# Patient Record
Sex: Male | Born: 1960 | Race: Black or African American | Hispanic: No | Marital: Married | Smoking: Former smoker
Health system: Southern US, Community
[De-identification: ages and names within clinical notes are randomized; demographics above are authoritative.]

## PROBLEM LIST (undated history)

## (undated) DIAGNOSIS — H269 Unspecified cataract: Secondary | ICD-10-CM

## (undated) DIAGNOSIS — M1990 Unspecified osteoarthritis, unspecified site: Secondary | ICD-10-CM

## (undated) DIAGNOSIS — C61 Malignant neoplasm of prostate: Secondary | ICD-10-CM

## (undated) DIAGNOSIS — F419 Anxiety disorder, unspecified: Secondary | ICD-10-CM

## (undated) DIAGNOSIS — F32A Depression, unspecified: Secondary | ICD-10-CM

## (undated) DIAGNOSIS — T7840XA Allergy, unspecified, initial encounter: Secondary | ICD-10-CM

## (undated) DIAGNOSIS — Z5189 Encounter for other specified aftercare: Secondary | ICD-10-CM

## (undated) DIAGNOSIS — C801 Malignant (primary) neoplasm, unspecified: Secondary | ICD-10-CM

## (undated) HISTORY — DX: Unspecified osteoarthritis, unspecified site: M19.90

## (undated) HISTORY — DX: Allergy, unspecified, initial encounter: T78.40XA

## (undated) HISTORY — DX: Unspecified cataract: H26.9

## (undated) HISTORY — PX: KNEE CARTILAGE SURGERY: SHX688

## (undated) HISTORY — PX: PROSTATE SURGERY: SHX751

## (undated) HISTORY — DX: Encounter for other specified aftercare: Z51.89

## (undated) HISTORY — PX: ROTATOR CUFF REPAIR: SHX139

---

## 2016-04-27 ENCOUNTER — Ambulatory Visit (INDEPENDENT_AMBULATORY_CARE_PROVIDER_SITE_OTHER): Payer: Self-pay | Admitting: Nurse Practitioner

## 2016-04-27 ENCOUNTER — Encounter: Payer: Self-pay | Admitting: *Deleted

## 2016-04-27 VITALS — BP 183/90 | HR 70 | Temp 98.3°F | Wt 133.8 lb

## 2016-04-27 DIAGNOSIS — S93602A Unspecified sprain of left foot, initial encounter: Secondary | ICD-10-CM

## 2016-04-27 NOTE — Progress Notes (Signed)
   Subjective:    Patient ID: Jeff Romero, male    DOB: Jun 24, 1961, 55 y.o.   MRN: [id number]  HPI The patient is a 55 y.o. AA male that presents with complaints of left foot pain x 5 days.  The patient states he was mowing the yard and stepped into a ditch twisting his foot.  The patient c/o swelling of pain to left foot and difficulty bearing weight.  The patient has used Ibuprofen and ice without relief.   Review of Systems  Musculoskeletal: Positive for gait problem and joint swelling.       Negative for deformity or crepitus, warm joint.       Objective:   Physical Exam  Constitutional: He is oriented to person, place, and time. He appears well-developed and well-nourished. No distress.  HENT:  Head: Normocephalic.  Neck: Normal range of motion.  Cardiovascular: Normal rate and normal heart sounds.   Pulmonary/Chest: Effort normal and breath sounds normal.  Musculoskeletal: He exhibits no deformity.  He exhibits tenderness and swelling to the phalanges of the great toe and the metatarsal bone.  Limited ROM, strong, equal, +2 dorsalis pedis and posterior tibial pulses. Toe warm with FROM, brisk capillary refill.  Neurological: He is alert and oriented to person, place, and time.  Skin: Skin is warm and dry.          Assessment & Plan:  The patient has been diagnosed with Left Foot Sprain.  An ace wrap was placed on the involved extremity.  RICE (rest, ice, compression and elevation) therapy advised.  The patient will also continue Ibuprofen 800mg  BID for continued pain and inflammation. The patient was given patient education on foot sprain and exercises.  Patient instructed to go to ER or return to clinic if no improvement in 7-10 days.  Patient verbalized understanding.

## 2016-05-06 ENCOUNTER — Ambulatory Visit (INDEPENDENT_AMBULATORY_CARE_PROVIDER_SITE_OTHER): Payer: Self-pay

## 2016-05-06 ENCOUNTER — Encounter (HOSPITAL_COMMUNITY): Payer: Self-pay | Admitting: Emergency Medicine

## 2016-05-06 ENCOUNTER — Ambulatory Visit (HOSPITAL_COMMUNITY)
Admission: EM | Admit: 2016-05-06 | Discharge: 2016-05-06 | Disposition: A | Discharge status: Home / Self Care | Payer: Self-pay | Attending: Family Medicine | Admitting: Family Medicine

## 2016-05-06 DIAGNOSIS — M79672 Pain in left foot: Secondary | ICD-10-CM

## 2016-05-06 NOTE — ED Provider Notes (Signed)
CSN: [id number]     Arrival date & time 05/06/16  1005 History   First MD Initiated Contact with Patient 05/06/16 1101     Chief Complaint  Patient presents with  . Foot Pain   (Consider location/radiation/quality/duration/timing/severity/associated sxs/prior Treatment) HPI 55 year old man with injury to his left foot August 19 after stepping in a hole twisting foot states he was seen at another urgent care that did not have an x-ray machine they advised him to wait a week and then have it x-rayed if he is still having pain patient states that he continues to have pain at this time with radiation up into the knee. Treatment has homeless included rest and elevation and Tylenol for pain. He is here with his wife requesting an x-ray this morning. History reviewed. No pertinent past medical history. Past Surgical History:  Procedure Laterality Date  . ROTATOR CUFF REPAIR Right    No family history on file. Social History  Substance Use Topics  . Smoking status: Former Smoker    Quit date: 05/09/2014  . Smokeless tobacco: Never Used  . Alcohol use Yes     Comment: social    Review of Systems  Denies: HEADACHE, NAUSEA, ABDOMINAL PAIN, CHEST PAIN, CONGESTION, DYSURIA, SHORTNESS OF BREATH  Allergies  Review of patient's allergies indicates no known allergies.  Home Medications   Prior to Admission medications   Not on File   Meds Ordered and Administered this Visit  Medications - No data to display  BP 160/100 (BP Location: Left Arm) Comment: Reported elevated BP to [facility]MA [employer]  Pulse 101   Temp 98.1 F (36.7 C) (Oral)   Resp 16   SpO2 100%  No data found.   Physical Exam NURSES NOTES AND VITAL SIGNS REVIEWED. CONSTITUTIONAL: Well developed, well nourished, no acute distress HEENT: normocephalic, atraumatic EYES: Conjunctiva normal NECK:normal ROM, supple, no adenopathy PULMONARY:No respiratory distress, normal effort ABDOMINAL: Soft, ND, NT BS+, No  CVAT MUSCULOSKELETAL: Normal ROM of all extremities, Left foot minimal tenderness noted without visible or   palpable deformity. Sensorimotor function are intact. SKIN: warm and dry without rash PSYCHIATRIC: Mood and affect, behavior are normal  Urgent Care Course   Clinical Course    Procedures (including critical care time)  Labs Review Labs Reviewed - No data to display  Imaging Review Dg Foot Complete Left  Result Date: 05/06/2016 CLINICAL DATA:  Foot pain after stepping in a hole. This occurred last week. EXAM: LEFT FOOT - COMPLETE 3+ VIEW COMPARISON:  None. FINDINGS: There is no evidence of fracture or dislocation. There is no evidence of arthropathy or other focal bone abnormality. Soft tissues are unremarkable. IMPRESSION: Negative. Electronically Signed   By: Staci Righter M.D.   On: 05/06/2016 11:16     Visual Acuity Review  Right Eye Distance:   Left Eye Distance:   Bilateral Distance:    Right Eye Near:   Left Eye Near:    Bilateral Near:        CAM WALKER AND REFER TO PODIATRY MDM   1. Foot pain, left     Patient is reassured that there are no issues that require transfer to higher level of care at this time or additional tests. Patient is advised to continue home symptomatic treatment. Patient is advised that if there are new or worsening symptoms to attend the emergency department, contact primary care provider, or return to UC. Instructions of care provided discharged home in stable condition.    THIS NOTE  WAS GENERATED USING A VOICE RECOGNITION SOFTWARE PROGRAM. ALL REASONABLE EFFORTS  WERE MADE TO PROOFREAD THIS DOCUMENT FOR ACCURACY.  I have verbally reviewed the discharge instructions with the patient. A printed AVS was given to the patient.  All questions were answered prior to discharge.      Konrad Felix, [place] 05/06/16 2038

## 2016-05-06 NOTE — ED Triage Notes (Signed)
PT rolled his ankle inward at work a week ago. PT reports he went to an urgent care and was told he may have a hairline fracture and to RICE for 1 week. PT reports pain is worse now. PT was never xrayed

## 2016-07-12 ENCOUNTER — Inpatient Hospital Stay (HOSPITAL_COMMUNITY)
Admission: EM | Admit: 2016-07-12 | Discharge: 2016-07-15 | DRG: 897 | Disposition: A | Discharge status: Home / Self Care | Payer: Self-pay | Attending: Family Medicine | Admitting: Family Medicine

## 2016-07-12 ENCOUNTER — Encounter (HOSPITAL_COMMUNITY): Payer: Self-pay

## 2016-07-12 ENCOUNTER — Ambulatory Visit (HOSPITAL_COMMUNITY)
Admission: EM | Admit: 2016-07-12 | Discharge: 2016-07-12 | Disposition: A | Discharge status: Nursing Home (Includes ICF) | Payer: Self-pay | Attending: Internal Medicine | Admitting: Internal Medicine

## 2016-07-12 DIAGNOSIS — F10188 Alcohol abuse with other alcohol-induced disorder: Principal | ICD-10-CM | POA: Diagnosis present

## 2016-07-12 DIAGNOSIS — E876 Hypokalemia: Secondary | ICD-10-CM | POA: Diagnosis present

## 2016-07-12 DIAGNOSIS — R1013 Epigastric pain: Secondary | ICD-10-CM

## 2016-07-12 DIAGNOSIS — R748 Abnormal levels of other serum enzymes: Secondary | ICD-10-CM | POA: Diagnosis present

## 2016-07-12 DIAGNOSIS — Z79899 Other long term (current) drug therapy: Secondary | ICD-10-CM

## 2016-07-12 DIAGNOSIS — Z9889 Other specified postprocedural states: Secondary | ICD-10-CM

## 2016-07-12 DIAGNOSIS — R112 Nausea with vomiting, unspecified: Secondary | ICD-10-CM | POA: Diagnosis present

## 2016-07-12 DIAGNOSIS — Z681 Body mass index (BMI) 19 or less, adult: Secondary | ICD-10-CM

## 2016-07-12 DIAGNOSIS — K759 Inflammatory liver disease, unspecified: Secondary | ICD-10-CM

## 2016-07-12 DIAGNOSIS — Z8249 Family history of ischemic heart disease and other diseases of the circulatory system: Secondary | ICD-10-CM

## 2016-07-12 DIAGNOSIS — D6959 Other secondary thrombocytopenia: Secondary | ICD-10-CM | POA: Diagnosis present

## 2016-07-12 DIAGNOSIS — R740 Nonspecific elevation of levels of transaminase and lactic acid dehydrogenase [LDH]: Secondary | ICD-10-CM | POA: Diagnosis present

## 2016-07-12 DIAGNOSIS — R109 Unspecified abdominal pain: Secondary | ICD-10-CM | POA: Diagnosis present

## 2016-07-12 DIAGNOSIS — E440 Moderate protein-calorie malnutrition: Secondary | ICD-10-CM | POA: Insufficient documentation

## 2016-07-12 DIAGNOSIS — F1010 Alcohol abuse, uncomplicated: Secondary | ICD-10-CM | POA: Diagnosis present

## 2016-07-12 DIAGNOSIS — Z87891 Personal history of nicotine dependence: Secondary | ICD-10-CM

## 2016-07-12 DIAGNOSIS — R7401 Elevation of levels of liver transaminase levels: Secondary | ICD-10-CM | POA: Diagnosis present

## 2016-07-12 DIAGNOSIS — E860 Dehydration: Secondary | ICD-10-CM | POA: Diagnosis present

## 2016-07-12 DIAGNOSIS — R7989 Other specified abnormal findings of blood chemistry: Secondary | ICD-10-CM | POA: Diagnosis present

## 2016-07-12 LAB — COMPREHENSIVE METABOLIC PANEL
ALBUMIN: 2.6 g/dL — AB (ref 3.5–5.0)
ALK PHOS: 270 U/L — AB (ref 38–126)
ALT: 151 U/L — ABNORMAL HIGH (ref 17–63)
ANION GAP: 11 (ref 5–15)
AST: 409 U/L — AB (ref 15–41)
BILIRUBIN TOTAL: 6.6 mg/dL — AB (ref 0.3–1.2)
CALCIUM: 8.7 mg/dL — AB (ref 8.9–10.3)
CO2: 28 mmol/L (ref 22–32)
Chloride: 93 mmol/L — ABNORMAL LOW (ref 101–111)
Creatinine, Ser: 1.07 mg/dL (ref 0.61–1.24)
GFR calc Af Amer: >60 mL/min (ref >60)
GLUCOSE: 135 mg/dL — AB (ref 65–99)
Potassium: 3 mmol/L — ABNORMAL LOW (ref 3.5–5.1)
Sodium: 132 mmol/L — ABNORMAL LOW (ref 135–145)
TOTAL PROTEIN: 5.8 g/dL — AB (ref 6.5–8.1)

## 2016-07-12 LAB — URINALYSIS, ROUTINE W REFLEX MICROSCOPIC
GLUCOSE, UA: 100 mg/dL — AB
KETONES UR: 15 mg/dL — AB
Nitrite: POSITIVE — AB
PROTEIN: 100 mg/dL — AB
Specific Gravity, Urine: 1.01 (ref 1.005–1.030)
pH: >9 — ABNORMAL HIGH (ref 5.0–8.0)

## 2016-07-12 LAB — CBC
HCT: 38.6 % — ABNORMAL LOW (ref 39.0–52.0)
Hemoglobin: 13.2 g/dL (ref 13.0–17.0)
MCH: 31.4 pg (ref 26.0–34.0)
MCHC: 34.2 g/dL (ref 30.0–36.0)
MCV: 91.7 fL (ref 78.0–100.0)
PLATELETS: 134 10*3/uL — AB (ref 150–400)
RBC: 4.21 MIL/uL — AB (ref 4.22–5.81)
RDW: 16.1 % — AB (ref 11.5–15.5)
WBC: 10.8 10*3/uL — AB (ref 4.0–10.5)

## 2016-07-12 LAB — URINE MICROSCOPIC-ADD ON

## 2016-07-12 LAB — PROTIME-INR
INR: 0.96
Prothrombin Time: 12.8 seconds (ref 11.4–15.2)

## 2016-07-12 LAB — I-STAT TROPONIN, ED: TROPONIN I, POC: 0 ng/mL (ref 0.00–0.08)

## 2016-07-12 LAB — ACETAMINOPHEN LEVEL

## 2016-07-12 LAB — LIPASE, BLOOD: Lipase: 88 U/L — ABNORMAL HIGH (ref 11–51)

## 2016-07-12 LAB — I-STAT CG4 LACTIC ACID, ED: LACTIC ACID, VENOUS: 1.35 mmol/L (ref 0.5–1.9)

## 2016-07-12 MED ORDER — ONDANSETRON 4 MG PO TBDP
4.0000 mg | ORAL_TABLET | Freq: Once | ORAL | Status: AC
  Administered 2016-07-12: 4 mg via ORAL

## 2016-07-12 MED ORDER — MORPHINE SULFATE (PF) 4 MG/ML IV SOLN
4.0000 mg | Freq: Once | INTRAVENOUS | Status: AC
  Administered 2016-07-12: 4 mg via INTRAVENOUS
  Filled 2016-07-12: qty 1

## 2016-07-12 MED ORDER — ONDANSETRON 4 MG PO TBDP
ORAL_TABLET | ORAL | Status: AC
  Filled 2016-07-12: qty 1

## 2016-07-12 MED ORDER — SODIUM CHLORIDE 0.9 % IV BOLUS (SEPSIS)
1000.0000 mL | Freq: Once | INTRAVENOUS | Status: AC
  Administered 2016-07-12: 1000 mL via INTRAVENOUS

## 2016-07-12 MED ORDER — ONDANSETRON HCL 4 MG/2ML IJ SOLN
4.0000 mg | Freq: Once | INTRAMUSCULAR | Status: AC
  Administered 2016-07-12: 4 mg via INTRAVENOUS
  Filled 2016-07-12: qty 2

## 2016-07-12 NOTE — ED Provider Notes (Signed)
[facility]    CSN: [id number] Arrival date & time: 07/12/16  1704     History   Chief Complaint Chief Complaint  Patient presents with  . Abdominal Pain    HPI Jeff Romero is a 55 y.o. male.   HPI Patient presents with severe epigastric pain and nausea x 3 days.  No vomiting but has dry heaves.  Pain getting worse. Admits hx of etoh abuse and gastric ulcer.  Denies fever, chills, constipation, diarrhea, melena, hematochezia.  Normal bowel movement today.  Has never had pain of this nature in the past.   No past medical history on file.  There are no active problems to display for this patient.   Past Surgical History:  Procedure Laterality Date  . ROTATOR CUFF REPAIR Right        Home Medications    Prior to Admission medications   Not on File    Family History No family history on file.  Social History Social History  Substance Use Topics  . Smoking status: Former Smoker    Quit date: 05/09/2014  . Smokeless tobacco: Never Used  . Alcohol use Yes     Comment: social     Allergies   Review of patient's allergies indicates no known allergies.   Review of Systems Review of Systems  Constitutional: Positive for activity change, appetite change and fatigue.  HENT: Negative.   Eyes: Negative.   Respiratory: Negative.   Cardiovascular: Negative.   Gastrointestinal: Positive for abdominal pain and nausea. Negative for anal bleeding, blood in stool, constipation, diarrhea and vomiting.  Genitourinary: Negative.   Musculoskeletal: Negative.   Skin: Negative.   Psychiatric/Behavioral: Negative.      Physical Exam Triage Vital Signs ED Triage Vitals  Enc Vitals Group     BP 07/12/16 1736 149/99     Pulse Rate 07/12/16 1736 112     Resp 07/12/16 1736 18     Temp 07/12/16 1736 97.7 F (36.5 C)     Temp Source 07/12/16 1736 Oral     SpO2 07/12/16 1736 100 %     Weight --      Height --      Head Circumference --      Peak Flow --     Pain Score 07/12/16 1757 9     Pain Loc --      Pain Edu? --      Excl. in [place]? --    No data found.   Updated Vital Signs BP 149/99 (BP Location: Left Arm)   Pulse 112   Temp 97.7 F (36.5 C) (Oral)   Resp 18   SpO2 100%   Visual Acuity Right Eye Distance:   Left Eye Distance:   Bilateral Distance:    Right Eye Near:   Left Eye Near:    Bilateral Near:     Physical Exam  Constitutional: He is oriented to person, place, and time. No distress.  HENT:  Head: Normocephalic and atraumatic.  Eyes: EOM are normal. Pupils are equal, round, and reactive to light.  Neck: Normal range of motion.  Cardiovascular: Regular rhythm.   tachycardic  Pulmonary/Chest: Effort normal.  Abdominal: He exhibits no mass. There is tenderness. There is guarding.  Musculoskeletal: Normal range of motion.  Neurological: He is alert and oriented to person, place, and time.  Skin: Skin is warm and dry.     UC Treatments / Results  Labs (all labs ordered are listed, but only abnormal  results are displayed) Labs Reviewed - No data to display  EKG  EKG Interpretation None       Radiology No results found.  Procedures Procedures (including critical care time)  Medications Ordered in UC Medications - No data to display   Initial Impression / Assessment and Plan / UC Course  I have reviewed the triage vital signs and the nursing notes.  Pertinent labs & imaging results that were available during my care of the patient were reviewed by me and considered in my medical decision making (see chart for details).  Clinical Course      Final Clinical Impressions(s) / UC Diagnoses   Final diagnoses:  Epigastric pain    New Prescriptions New Prescriptions   No medications on file  due to the severity of patient's pain and duration of symptoms we recommend that he go immediately to the ED for evaluation and treatment.  Patient and family member both voice understanding.     Lanae Crumbly, PA-C 07/12/16 1815

## 2016-07-12 NOTE — ED Triage Notes (Addendum)
Patient complains of epigastric pain and cramping with vomiting since Friday, denies diarrhea. States that he gets mild relief with anti-acids but the discomfort quickly returns. Offered zofran at triage, patient denied. Patient diaphoretic and restless on arrival. Patient states that he drinks 6 beers per day

## 2016-07-12 NOTE — ED Triage Notes (Signed)
The patient presented to the [facility] with a complaint of medial abdominal pain with N/V that started 2 days ago. The patient described the pain as sharp and somewhat epigastric in location. The patient also stated he has been able to keep down fluids and some food but after eating ice cream today the pain became more intense.

## 2016-07-12 NOTE — ED Notes (Signed)
Nurse drawing labs. 

## 2016-07-13 ENCOUNTER — Inpatient Hospital Stay (HOSPITAL_COMMUNITY): Payer: Self-pay

## 2016-07-13 ENCOUNTER — Encounter (HOSPITAL_COMMUNITY): Payer: Self-pay | Admitting: Radiology

## 2016-07-13 ENCOUNTER — Emergency Department (HOSPITAL_COMMUNITY): Payer: Self-pay

## 2016-07-13 ENCOUNTER — Other Ambulatory Visit (HOSPITAL_COMMUNITY): Payer: Self-pay

## 2016-07-13 DIAGNOSIS — R740 Nonspecific elevation of levels of transaminase and lactic acid dehydrogenase [LDH]: Secondary | ICD-10-CM

## 2016-07-13 DIAGNOSIS — R7401 Elevation of levels of liver transaminase levels: Secondary | ICD-10-CM | POA: Diagnosis present

## 2016-07-13 DIAGNOSIS — R109 Unspecified abdominal pain: Secondary | ICD-10-CM | POA: Diagnosis present

## 2016-07-13 DIAGNOSIS — F1010 Alcohol abuse, uncomplicated: Secondary | ICD-10-CM | POA: Diagnosis present

## 2016-07-13 DIAGNOSIS — R1013 Epigastric pain: Secondary | ICD-10-CM

## 2016-07-13 DIAGNOSIS — R112 Nausea with vomiting, unspecified: Secondary | ICD-10-CM | POA: Diagnosis present

## 2016-07-13 LAB — CBC WITH DIFFERENTIAL/PLATELET
Basophils Absolute: 0 10*3/uL (ref 0.0–0.1)
Basophils Relative: 1 %
EOS ABS: 0 10*3/uL (ref 0.0–0.7)
EOS PCT: 0 %
HCT: 30.8 % — ABNORMAL LOW (ref 39.0–52.0)
HEMOGLOBIN: 10.6 g/dL — AB (ref 13.0–17.0)
LYMPHS ABS: 1.3 10*3/uL (ref 0.7–4.0)
Lymphocytes Relative: 22 %
MCH: 31.8 pg (ref 26.0–34.0)
MCHC: 34.4 g/dL (ref 30.0–36.0)
MCV: 92.5 fL (ref 78.0–100.0)
MONO ABS: 0.4 10*3/uL (ref 0.1–1.0)
MONOS PCT: 6 %
Neutro Abs: 4.2 10*3/uL (ref 1.7–7.7)
Neutrophils Relative %: 71 %
PLATELETS: 111 10*3/uL — AB (ref 150–400)
RBC: 3.33 MIL/uL — ABNORMAL LOW (ref 4.22–5.81)
RDW: 16.8 % — AB (ref 11.5–15.5)
WBC: 5.8 10*3/uL (ref 4.0–10.5)

## 2016-07-13 LAB — GLUCOSE, CAPILLARY
GLUCOSE-CAPILLARY: 81 mg/dL (ref 65–99)
Glucose-Capillary: 109 mg/dL — ABNORMAL HIGH (ref 65–99)

## 2016-07-13 LAB — HEPATIC FUNCTION PANEL
ALBUMIN: 2.3 g/dL — AB (ref 3.5–5.0)
ALK PHOS: 245 U/L — AB (ref 38–126)
ALT: 132 U/L — AB (ref 17–63)
AST: 281 U/L — AB (ref 15–41)
BILIRUBIN DIRECT: 3 mg/dL — AB (ref 0.1–0.5)
BILIRUBIN TOTAL: 4.6 mg/dL — AB (ref 0.3–1.2)
Indirect Bilirubin: 1.6 mg/dL — ABNORMAL HIGH (ref 0.3–0.9)
Total Protein: 5.1 g/dL — ABNORMAL LOW (ref 6.5–8.1)

## 2016-07-13 LAB — BASIC METABOLIC PANEL
ANION GAP: 5 (ref 5–15)
BUN: <5 mg/dL — ABNORMAL LOW (ref 6–20)
CHLORIDE: 101 mmol/L (ref 101–111)
CO2: 27 mmol/L (ref 22–32)
Calcium: 8.1 mg/dL — ABNORMAL LOW (ref 8.9–10.3)
Creatinine, Ser: 1.02 mg/dL (ref 0.61–1.24)
GFR calc non Af Amer: >60 mL/min (ref >60)
GLUCOSE: 100 mg/dL — AB (ref 65–99)
Potassium: 4 mmol/L (ref 3.5–5.1)
Sodium: 133 mmol/L — ABNORMAL LOW (ref 135–145)

## 2016-07-13 LAB — PHOSPHORUS: Phosphorus: 3.5 mg/dL (ref 2.5–4.6)

## 2016-07-13 LAB — MAGNESIUM
MAGNESIUM: 1.8 mg/dL (ref 1.7–2.4)
MAGNESIUM: 1.6 mg/dL — AB (ref 1.7–2.4)

## 2016-07-13 MED ORDER — PIPERACILLIN-TAZOBACTAM 3.375 G IVPB
3.3750 g | Freq: Three times a day (TID) | INTRAVENOUS | Status: DC
  Administered 2016-07-13 – 2016-07-14 (×3): 3.375 g via INTRAVENOUS
  Filled 2016-07-13 (×5): qty 50

## 2016-07-13 MED ORDER — PIPERACILLIN-TAZOBACTAM 3.375 G IVPB 30 MIN
3.3750 g | Freq: Once | INTRAVENOUS | Status: DC
  Filled 2016-07-13: qty 50

## 2016-07-13 MED ORDER — VITAMIN B-1 100 MG PO TABS
100.0000 mg | ORAL_TABLET | Freq: Every day | ORAL | Status: DC
  Administered 2016-07-13 – 2016-07-15 (×3): 100 mg via ORAL
  Filled 2016-07-13 (×3): qty 1

## 2016-07-13 MED ORDER — THIAMINE HCL 100 MG/ML IJ SOLN
100.0000 mg | Freq: Every day | INTRAMUSCULAR | Status: DC
  Filled 2016-07-13: qty 2

## 2016-07-13 MED ORDER — SODIUM CHLORIDE 0.9 % IV BOLUS (SEPSIS)
1000.0000 mL | Freq: Once | INTRAVENOUS | Status: AC
Start: 2016-07-13 — End: 2016-07-13
  Administered 2016-07-13: 1000 mL via INTRAVENOUS

## 2016-07-13 MED ORDER — IOPAMIDOL (ISOVUE-300) INJECTION 61%
INTRAVENOUS | Status: AC
  Administered 2016-07-13: 100 mL
  Filled 2016-07-13: qty 100

## 2016-07-13 MED ORDER — LORAZEPAM 2 MG/ML IJ SOLN
0.0000 mg | Freq: Two times a day (BID) | INTRAMUSCULAR | Status: DC
Start: 2016-07-15 — End: 2016-07-15

## 2016-07-13 MED ORDER — HYDROMORPHONE HCL 1 MG/ML IJ SOLN
1.0000 mg | Freq: Once | INTRAMUSCULAR | Status: AC
  Administered 2016-07-13: 1 mg via INTRAVENOUS
  Filled 2016-07-13: qty 1

## 2016-07-13 MED ORDER — GADOBENATE DIMEGLUMINE 529 MG/ML IV SOLN
11.0000 mL | Freq: Once | INTRAVENOUS | Status: AC | PRN
  Administered 2016-07-13: 11 mL via INTRAVENOUS

## 2016-07-13 MED ORDER — ONDANSETRON HCL 4 MG/2ML IJ SOLN: 4.0000 mg | Freq: Four times a day (QID) | INTRAMUSCULAR | Status: DC | PRN

## 2016-07-13 MED ORDER — WHITE PETROLATUM GEL
Status: AC
  Administered 2016-07-13: 09:00:00
  Filled 2016-07-13: qty 1

## 2016-07-13 MED ORDER — POTASSIUM CHLORIDE 10 MEQ/100ML IV SOLN
10.0000 meq | INTRAVENOUS | Status: AC
  Administered 2016-07-13 (×2): 10 meq via INTRAVENOUS
  Filled 2016-07-13: qty 100

## 2016-07-13 MED ORDER — ADULT MULTIVITAMIN W/MINERALS CH
1.0000 | ORAL_TABLET | Freq: Every day | ORAL | Status: DC
  Administered 2016-07-14 – 2016-07-15 (×2): 1 via ORAL
  Filled 2016-07-13 (×3): qty 1

## 2016-07-13 MED ORDER — LORAZEPAM 2 MG/ML IJ SOLN: 0.5000 mg | Freq: Once | INTRAMUSCULAR | Status: DC

## 2016-07-13 MED ORDER — MORPHINE SULFATE (PF) 2 MG/ML IV SOLN: 2.0000 mg | INTRAVENOUS | Status: DC | PRN

## 2016-07-13 MED ORDER — POTASSIUM CHLORIDE 10 MEQ/100ML IV SOLN
10.0000 meq | INTRAVENOUS | Status: AC
  Administered 2016-07-13 (×2): 10 meq via INTRAVENOUS
  Filled 2016-07-13 (×3): qty 100

## 2016-07-13 MED ORDER — ONDANSETRON HCL 4 MG PO TABS: 4.0000 mg | ORAL_TABLET | Freq: Four times a day (QID) | ORAL | Status: DC | PRN

## 2016-07-13 MED ORDER — LORAZEPAM 1 MG PO TABS: 1.0000 mg | ORAL_TABLET | Freq: Four times a day (QID) | ORAL | Status: DC | PRN

## 2016-07-13 MED ORDER — SODIUM CHLORIDE 0.9 % IV SOLN
INTRAVENOUS | Status: AC
  Administered 2016-07-13 (×2): via INTRAVENOUS

## 2016-07-13 MED ORDER — LORAZEPAM 2 MG/ML IJ SOLN
0.0000 mg | Freq: Four times a day (QID) | INTRAMUSCULAR | Status: AC
Start: 2016-07-13 — End: 2016-07-15

## 2016-07-13 MED ORDER — MORPHINE SULFATE (PF) 2 MG/ML IV SOLN
2.0000 mg | INTRAVENOUS | Status: DC | PRN
  Administered 2016-07-13 (×3): 2 mg via INTRAVENOUS
  Filled 2016-07-13 (×4): qty 1

## 2016-07-13 MED ORDER — ORAL CARE MOUTH RINSE
15.0000 mL | Freq: Two times a day (BID) | OROMUCOSAL | Status: DC
  Administered 2016-07-13 – 2016-07-15 (×4): 15 mL via OROMUCOSAL

## 2016-07-13 MED ORDER — LORAZEPAM 2 MG/ML IJ SOLN: 1.0000 mg | Freq: Four times a day (QID) | INTRAMUSCULAR | Status: DC | PRN

## 2016-07-13 MED ORDER — FOLIC ACID 1 MG PO TABS
1.0000 mg | ORAL_TABLET | Freq: Every day | ORAL | Status: DC
  Administered 2016-07-14 – 2016-07-15 (×2): 1 mg via ORAL
  Filled 2016-07-13 (×3): qty 1

## 2016-07-13 MED ORDER — MAGNESIUM SULFATE 2 GM/50ML IV SOLN
2.0000 g | Freq: Once | INTRAVENOUS | Status: AC
Start: 2016-07-13 — End: 2016-07-13
  Administered 2016-07-13: 2 g via INTRAVENOUS
  Filled 2016-07-13: qty 50

## 2016-07-13 NOTE — Progress Notes (Signed)
PROGRESS NOTE    Jeff Romero  [id number] DOB: 1961/01/10 DOA: 07/12/2016 PCP: No PCP Per Patient   Brief Narrative: Jeff Romero is a 55 y.o. male with a history of alcohol abuse. He presented with nausea, vomiting and epigastric pain and was found to have possible cholecystitis. He had associated elevations in AST/ALT and alkaline phosphatase with elevated bilirubin (indirect and direct)   Assessment & Plan:   Principal Problem:   Abdominal pain Active Problems:   Transaminitis   Nausea & vomiting   Alcohol abuse   Abdominal pain with nausea and vomiting Possibly secondary to cholecystitis. Surgery suggesting ERCP. Per note, they have already consulted GI -follow-up GI recommendations -follow-up surgery recommendations -NPO -pain management  Transaminitis Elevated Alkaline phosphatase Hyperbilirubinemia Possibly secondary to alcoholic hepatitis vs sequelae from possible choledocholithiasis. -hepatitis panel pending -GI consulted for possible ERCP  Hypomagnesemia -replete  Alcohol abuse -continue CIWA -continue multivitamin, thiamine, folic acid  Hypokalemia -resolved    DVT prophylaxis: SCDs Code Status: Full code Family Communication: None at bedside Disposition Plan: Discharge home once improved   Consultants:   Surgery  GI per surgery  Procedures:   None  Antimicrobials:  Zosyn (10/9>>   Subjective: Patient reports no pain at the current time. No nausea or emesis. No chest pain.  Objective: Vitals:   07/13/16 0115 07/13/16 0200 07/13/16 0245 07/13/16 0337  BP:    108/67  Pulse: 93 86 91 78  Resp: 16 15 11 18   Temp:    99 F (37.2 C)  TempSrc:      SpO2: 98% 98% 98% 99%  Weight:    56.7 kg (124 lb 14.4 oz)  Height:    5\' 7"  (1.702 m)    Intake/Output Summary (Last 24 hours) at 07/13/16 1236 Last data filed at 07/13/16 [id number]  Gross per 24 hour  Intake          1316.25 ml  Output              200 ml  Net          1116.25 ml    Filed Weights   07/12/16 1824 07/13/16 0337  Weight: 61.2 kg (135 lb) 56.7 kg (124 lb 14.4 oz)    Examination:  General exam: Appears calm and comfortable. Thin appearing Respiratory system: Clear to auscultation. Respiratory effort normal. Cardiovascular system: S1 & S2 heard, RRR. No murmurs, rubs, gallops or clicks. Gastrointestinal system: Abdomen is nondistended, soft and nontender. No organomegaly or masses felt. Normal bowel sounds heard. Central nervous system: Alert and oriented. No focal neurological deficits. No asterixis. No tremor Extremities: No edema. No calf tenderness Skin: No cyanosis. No rashes Psychiatry: Judgement and insight appear normal. Mood & affect appropriate.     Data Reviewed: I have personally reviewed following labs and imaging studies  CBC:  Recent Labs Lab 07/12/16 1826 07/13/16 0617  WBC 10.8* 5.8  NEUTROABS  --  4.2  HGB 13.2 10.6*  HCT 38.6* 30.8*  MCV 91.7 92.5  PLT 134* [id number]*   Basic Metabolic Panel:  Recent Labs Lab 07/12/16 1826 07/13/16 0617  NA 132* 133*  K 3.0* 4.0  CL 93* 101  CO2 28 27  GLUCOSE 135* 100*  BUN <5* <5*  CREATININE 1.07 1.02  CALCIUM 8.7* 8.1*  MG 1.8 1.6*  PHOS 3.5  --    GFR: Estimated Creatinine Clearance: 65.6 mL/min (by C-G formula based on SCr of 1.02 mg/dL). Liver Function Tests:  Recent Labs Lab 07/12/16 1826  07/13/16 0617  AST 409* 281*  ALT 151* 132*  ALKPHOS 270* 245*  BILITOT 6.6* 4.6*  PROT 5.8* 5.1*  ALBUMIN 2.6* 2.3*    Recent Labs Lab 07/12/16 1826  LIPASE 88*   No results for input(s): AMMONIA in the last 168 hours. Coagulation Profile:  Recent Labs Lab 07/12/16 2115  INR 0.96   Cardiac Enzymes: No results for input(s): CKTOTAL, CKMB, CKMBINDEX, TROPONINI in the last 168 hours. BNP (last 3 results) No results for input(s): PROBNP in the last 8760 hours. HbA1C: No results for input(s): HGBA1C in the last 72 hours. CBG:  Recent Labs Lab 07/13/16 0631   GLUCAP 109*   Lipid Profile: No results for input(s): CHOL, HDL, LDLCALC, TRIG, CHOLHDL, LDLDIRECT in the last 72 hours. Thyroid Function Tests: No results for input(s): TSH, T4TOTAL, FREET4, T3FREE, THYROIDAB in the last 72 hours. Anemia Panel: No results for input(s): VITAMINB12, FOLATE, FERRITIN, TIBC, IRON, RETICCTPCT in the last 72 hours. Sepsis Labs:  Recent Labs Lab 07/12/16 2130  LATICACIDVEN 1.35    No results found for this or any previous visit (from the past 240 hour(s)).       Radiology Studies: US Abdomen Complete  Result Date: 07/13/2016 CLINICAL DATA:  Abdominal pain, epigastric pain EXAM: ABDOMEN ULTRASOUND COMPLETE COMPARISON:  None. FINDINGS: Gallbladder: Mildly abnormal gallbladder wall thickening at 4 mm. Sonographic Murphy's sign absent. Sludge is present in the gallbladder. Gallbladder slightly distended at 4 cm diameter. Common bile duct: Diameter: 6 mm Liver: Very minimally complex cystic lesion in the right hepatic lobe shown on image 39/8, subtle internal echoes, enhance through transmission. Coarse echogenic liver with poor sonic penetration compatible with diffuse hepatic steatosis. IVC: No abnormality visualized. Pancreas: Visualized portion unremarkable. Spleen: Not well visualized sonographically, but normal appearance on the CT scan from earlier today. Right Kidney: Length: 10.0 cm. Echogenicity within normal limits. No mass or hydronephrosis visualized. Left Kidney: Length: 10.2 cm. Echogenicity within normal limits. No mass or hydronephrosis visualized. Abdominal aorta: No aneurysm visualized. Other findings: None. IMPRESSION: 1. Thick-walled gallbladder, slightly distended, with internal sludge but no definite calculi. Sonographic Murphy's sign absent. Correlate clinically in assessing for cholecystitis. 2. Diffuse hepatic steatosis. 3. Small cyst in the liver, minimal complexity, likely benign. 4. Nonvisualization of the spleen due to difficulty  obtaining sonic window ; the spleen appeared normal on the earlier CT scan. Electronically Signed   By: Van Clines M.D.   On: 07/13/2016 10:55   Ct Abdomen Pelvis W Contrast  Result Date: 07/13/2016 CLINICAL DATA:  Acute onset of right upper quadrant abdominal pain, and bilateral lower quadrant abdominal pain. Nausea and vomiting. Initial encounter. EXAM: CT ABDOMEN AND PELVIS WITH CONTRAST TECHNIQUE: Multidetector CT imaging of the abdomen and pelvis was performed using the standard protocol following bolus administration of intravenous contrast. CONTRAST:  149mL ISOVUE-300 IOPAMIDOL (ISOVUE-300) INJECTION 61% COMPARISON:  None. FINDINGS: Lower chest: The visualized lung bases are grossly clear. The visualized portions of the mediastinum are unremarkable. Hepatobiliary: A 1.1 cm cyst is noted at the right hepatic lobe. The liver is otherwise unremarkable. Mild edema is noted at the gallbladder wall, with trace pericholecystic fluid, raising concern for mild acute cholecystitis. The gallbladder is mildly distended. The common bile duct remains normal in caliber. Pancreas: The pancreas is within normal limits. Spleen: The spleen is unremarkable in appearance. Adrenals/Urinary Tract: The adrenal glands are unremarkable in appearance. The kidneys are within normal limits. There is no evidence of hydronephrosis. No renal or ureteral stones are  identified. No perinephric stranding is seen. Stomach/Bowel: The stomach is unremarkable in appearance. The small bowel is within normal limits. The appendix is normal in caliber, without evidence of appendicitis. The colon is unremarkable in appearance. Trace fluid is seen tracking along the right paracolic gutter. Vascular/Lymphatic: Diffuse calcification is noted along the common iliac arteries bilaterally, with mild calcification noted along the abdominal aorta. No retroperitoneal or pelvic sidewall lymphadenopathy is seen. Reproductive: The prostate is borderline  prominent. The bladder is mildly distended and grossly unremarkable. Other: A small amount of free fluid is seen within the pelvis, of uncertain significance. Musculoskeletal: No acute osseous abnormalities are identified. The visualized musculature is unremarkable in appearance. IMPRESSION: 1. Mild edema at the gallbladder wall, with trace pericholecystic fluid, raising concern for mild acute cholecystitis. Gallbladder mildly distended in appearance. 2. Trace fluid tracking along the right paracolic gutter, and small amount of free fluid in the pelvis. This may reflect the gallbladder process. 3. 1.1 cm hepatic cyst noted. 4. Scattered aortic atherosclerosis, with diffuse calcification along the common iliac arteries bilaterally. Electronically Signed   By: Garald Balding M.D.   On: 07/13/2016 01:32        Scheduled Meds: . folic acid  1 mg Oral Daily  . LORazepam  0-4 mg Intravenous Q6H   Followed by  . [START ON 07/15/2016] LORazepam  0-4 mg Intravenous Q12H  . mouth rinse  15 mL Mouth Rinse BID  . multivitamin with minerals  1 tablet Oral Daily  . piperacillin-tazobactam  3.375 g Intravenous Once  . piperacillin-tazobactam (ZOSYN)  IV  3.375 g Intravenous Q8H  . thiamine  100 mg Oral Daily   Or  . thiamine  100 mg Intravenous Daily   Continuous Infusions: . sodium chloride 75 mL/hr at 07/13/16 0909     LOS: 0 days     Cordelia Poche [facility] 07/13/2016, 12:36 PM Pager: [phone]  If 7PM-7AM, please contact night-coverage [email or web address] Password [email or web address] 07/13/2016, 12:36 PM

## 2016-07-13 NOTE — Care Management Note (Signed)
Case Management Note  Patient Details  Name: Lendon Lantis MRN: [id number] Date of Birth: Dec 25, 1960  Subjective/Objective:                 Patient from home with spouse, CIWA, states 6-8 beers a day. R/o choley, is NPO.    Action/Plan:  May need assistance with PCP/ meds at DC.   Expected Discharge Date:                  Expected Discharge Plan:  Home/Self Care  In-House Referral:  Clinical Social Work  Discharge planning Services  CM Consult  Post Acute Care Choice:  NA Choice offered to:  NA  DME Arranged:  N/A DME Agency:  NA  HH Arranged:  NA HH Agency:  NA  Status of Service:  In process, will continue to follow  If discussed at Long Length of Stay Meetings, dates discussed:    Additional Comments:  Carles Collet, RN 07/13/2016, 12:04 PM

## 2016-07-13 NOTE — Progress Notes (Signed)
Pharmacy Antibiotic Note  Jeff Romero is a 55 y.o. male admitted on 07/12/2016 with CT concering for acute cholecystitis.  Pharmacy has been consulted for Zosyn dosing.  Plan: Zosyn 3.375gm IV now over 30 min then 3.375gm IV q8h - subsequent doses over 4 hours Will f/u micro data, renal function, and pt's clinical condition   Height: 5\' 7"  (170.2 cm) Weight: 135 lb (61.2 kg) IBW/kg (Calculated) : 66.1  Temp (24hrs), Avg:98.2 F (36.8 C), Min:97.7 F (36.5 C), Max:98.7 F (37.1 C)   Recent Labs Lab 07/12/16 1826 07/12/16 2130  WBC 10.8*  --   CREATININE 1.07  --   LATICACIDVEN  --  1.35    Estimated Creatinine Clearance: 67.5 mL/min (by C-G formula based on SCr of 1.07 mg/dL).    No Known Allergies  Antimicrobials this admission: 10/9 Zosyn >>   Thank you for allowing pharmacy to be a part of this patient's care.  Sherlon Handing, PharmD, BCPS Clinical pharmacist, pager [phone] 07/13/2016 3:10 AM

## 2016-07-13 NOTE — ED Provider Notes (Signed)
[facility] [place] [facility] DEPT Provider Note   CSN: [id number] Arrival date & time: 07/12/16  1818     History   Chief Complaint Chief Complaint  Patient presents with  . Abdominal Pain  . Emesis    HPI Jeff Romero is a 55 y.o. male.  HPI  55 year old male with history of alcohol abuse presents with concern of epigastric abdominal pain from urgent care. Patient reports daily drinking, 6-8 drinks per day, with binging, and development of epigastric abdominal pain 3 days ago. Reports approximately 2 episodes of emesis per day. Reports that the pain and the vomiting are worse with eating or drinking. Has not been able to eat or drink much. Has not had a drink alcohol 48 hours. Denies any fevers, denies urinary symptoms, diarrhea. Has tried Alka-Seltzer, Tylenol, Pepcid for abdominal pain with minimal relief.  Pain was severe.   History reviewed. No pertinent past medical history.  There are no active problems to display for this patient.   Past Surgical History:  Procedure Laterality Date  . ROTATOR CUFF REPAIR Right        Home Medications    Prior to Admission medications   Medication Sig Start Date End Date Taking? Authorizing Provider  calcium carbonate (TUMS - DOSED IN MG ELEMENTAL CALCIUM) 500 MG chewable tablet Chew 1 tablet by mouth daily as needed for indigestion or heartburn.   Yes Historical Provider, MD  famotidine (PEPCID) 20 MG tablet Take 20 mg by mouth daily as needed for heartburn or indigestion.   Yes Historical Provider, MD    Family History No family history on file.  Social History Social History  Substance Use Topics  . Smoking status: Former Smoker    Quit date: 05/09/2014  . Smokeless tobacco: Never Used  . Alcohol use Yes     Comment: social     Allergies   Review of patient's allergies indicates no known allergies.   Review of Systems Review of Systems  Constitutional: Positive for appetite change and unexpected weight change. Negative for  fever.  HENT: Negative for sore throat.   Eyes: Negative for visual disturbance.  Respiratory: Negative for shortness of breath.   Cardiovascular: Negative for chest pain (lower epigastric).  Gastrointestinal: Positive for abdominal pain, nausea and vomiting. Negative for diarrhea.  Genitourinary: Negative for difficulty urinating (dark colored urine) and dysuria.  Musculoskeletal: Negative for back pain and neck stiffness.  Skin: Negative for rash.  Neurological: Negative for syncope and headaches.     Physical Exam Updated Vital Signs BP 115/75   Pulse 89   Temp 98.7 F (37.1 C) (Oral)   Resp 17   Ht 5\' 7"  (1.702 m)   Wt 135 lb (61.2 kg)   SpO2 98%   BMI 21.14 kg/m   Physical Exam  Constitutional: He is oriented to person, place, and time. He appears well-developed and well-nourished. No distress.  HENT:  Head: Normocephalic and atraumatic.  Eyes: Scleral icterus is present.  Neck: Normal range of motion.  Cardiovascular: Regular rhythm, normal heart sounds and intact distal pulses.  Tachycardia present.  Exam reveals no gallop and no friction rub.   No murmur heard. Pulmonary/Chest: Effort normal and breath sounds normal. No respiratory distress. He has no wheezes. He has no rales.  Abdominal: Soft. He exhibits no distension. There is tenderness (epigastric, RLQ, RUQ). There is guarding and CVA tenderness (right mild).  Musculoskeletal: He exhibits no edema.  Neurological: He is alert and oriented to person, place, and time.  Skin:  Skin is warm and dry. He is not diaphoretic.  Nursing note and vitals reviewed.    ED Treatments / Results  Labs (all labs ordered are listed, but only abnormal results are displayed) Labs Reviewed  LIPASE, BLOOD - Abnormal; Notable for the following:       Result Value   Lipase 88 (*)    All other components within normal limits  COMPREHENSIVE METABOLIC PANEL - Abnormal; Notable for the following:    Sodium 132 (*)    Potassium 3.0  (*)    Chloride 93 (*)    Glucose, Bld 135 (*)    BUN <5 (*)    Calcium 8.7 (*)    Total Protein 5.8 (*)    Albumin 2.6 (*)    AST 409 (*)    ALT 151 (*)    Alkaline Phosphatase 270 (*)    Total Bilirubin 6.6 (*)    All other components within normal limits  CBC - Abnormal; Notable for the following:    WBC 10.8 (*)    RBC 4.21 (*)    HCT 38.6 (*)    RDW 16.1 (*)    Platelets 134 (*)    All other components within normal limits  URINALYSIS, ROUTINE W REFLEX MICROSCOPIC (NOT AT [facility]) - Abnormal; Notable for the following:    Color, Urine BROWN (*)    APPearance CLOUDY (*)    pH >9.0 (*)    Glucose, UA 100 (*)    Hgb urine dipstick TRACE (*)    Bilirubin Urine LARGE (*)    Ketones, ur 15 (*)    Protein, ur 100 (*)    Nitrite POSITIVE (*)    Leukocytes, UA MODERATE (*)    All other components within normal limits  URINE MICROSCOPIC-ADD ON - Abnormal; Notable for the following:    Squamous Epithelial / LPF 0-5 (*)    Bacteria, UA FEW (*)    All other components within normal limits  ACETAMINOPHEN LEVEL - Abnormal; Notable for the following:    Acetaminophen (Tylenol), Serum <10 (*)    All other components within normal limits  PROTIME-INR  MAGNESIUM  PHOSPHORUS  HEPATITIS PANEL, ACUTE  I-STAT TROPOININ, ED  I-STAT CG4 LACTIC ACID, ED  I-STAT CG4 LACTIC ACID, ED    EKG  EKG Interpretation  Date/Time:  Sunday July 12 2016 18:24:45 EDT Ventricular Rate:  114 PR Interval:  126 QRS Duration: 76 QT Interval:  338 QTC Calculation: 465 R Axis:   42 Text Interpretation:  Sinus tachycardia with occasional Premature ventricular complexes Right atrial enlargement Borderline ECG No old tracing to compare Confirmed by [facility] MD, PEDRO [id number]) on 07/13/2016 12:11:14 AM       Radiology No results found.  Procedures Procedures (including critical care time)  Medications Ordered in ED Medications  potassium chloride 10 mEq in 100 mL IVPB (not administered)    HYDROmorphone (DILAUDID) injection 1 mg (not administered)  ondansetron (ZOFRAN-ODT) disintegrating tablet 4 mg (4 mg Oral Given 07/12/16 1832)  sodium chloride 0.9 % bolus 1,000 mL (1,000 mLs Intravenous New Bag/Given 07/12/16 2131)  morphine 4 MG/ML injection 4 mg (4 mg Intravenous Given 07/12/16 2130)  ondansetron (ZOFRAN) injection 4 mg (4 mg Intravenous Given 07/12/16 2130)  iopamidol (ISOVUE-300) 61 % injection (100 mLs  Contrast Given 07/13/16 0023)     Initial Impression / Assessment and Plan / ED Course  I have reviewed the triage vital signs and the nursing notes.  Pertinent labs & imaging results  that were available during my care of the patient were reviewed by me and considered in my medical decision making (see chart for details).  Clinical Course   55 year old male with history of alcohol abuse presents with concern of epigastric abdominal pain from urgent care. Patient reports daily drinking, 6-8 drinks per day, with binging, and development of epigastric abdominal pain 3 days ago.  Labs completed concerning for elevated AST, ALT, alkaline phosphatase, bilirubin of 6.6, lipase mildly elevated at 88, hypokalemia of 3, mild leukocytosis.    Tylenol level within normal limits. Sent viral hepatitis panel. Given other lower areas of tenderness, CT abdomen and pelvis was ordered to evaluate for other biliary obstruction or other abnormalities.   Currently awaiting CT results with differential including alcoholic hepatitis versus cholecystitis versus other biliary obstruction.  Urinalysis unreliable by bilirubin and doubt UTI.  Plan for admission pending results.   Final Clinical Impressions(s) / ED Diagnoses   Final diagnoses:  Hyperbilirubinemia  Epigastric pain  Hepatitis  Alcohol abuse    New Prescriptions New Prescriptions   No medications on file     Gareth Morgan, MD 07/13/16 0110

## 2016-07-13 NOTE — Progress Notes (Signed)
[facility] Progress Note     Subjective: C/o epigastric pain. Denies nausea or vomiting. Having small, non-bloody BMs.  Objective: Vital signs in last 24 hours: Temp:  [97.7 F (36.5 C)-99 F (37.2 C)] 97.8 F (36.6 C) (10/09 0529) Pulse Rate:  [78-118] 88 (10/09 0529) Resp:  [11-22] 20 (10/09 0529) BP: (108-149)/(67-110) 123/77 (10/09 0529) SpO2:  [96 %-100 %] 98 % (10/09 0529) Weight:  [124 lb 14.4 oz (56.7 kg)-135 lb (61.2 kg)] 124 lb 14.4 oz (56.7 kg) (10/09 0337) Last BM Date: 07/12/16  Intake/Output from previous day: 10/08 0701 - 10/09 0700 In: 1316.3 [I.V.:116.3; IV Piggyback:1200] Out: 200 [Urine:200] Intake/Output this shift: No intake/output data recorded.  PE: Gen:  Alert, NAD, pleasant Card:  RRR, no M/G/R Pulm:  CTA, no W/R/R Abd: Soft, TTP epigastrium, ND, +BS  Lab Results:   Recent Labs  07/12/16 1826 07/13/16 0617  WBC 10.8* 5.8  HGB 13.2 10.6*  HCT 38.6* 30.8*  PLT 134* 111*   BMET  Recent Labs  07/12/16 1826 07/13/16 0617  NA 132* 133*  K 3.0* 4.0  CL 93* 101  CO2 28 27  GLUCOSE 135* 100*  BUN <5* <5*  CREATININE 1.07 1.02  CALCIUM 8.7* 8.1*   PT/INR  Recent Labs  07/12/16 2115  LABPROT 12.8  INR 0.96   CMP     Component Value Date/Time   NA 133 (L) 07/13/2016 0617   K 4.0 07/13/2016 0617   CL 101 07/13/2016 0617   CO2 27 07/13/2016 0617   GLUCOSE 100 (H) 07/13/2016 0617   BUN <5 (L) 07/13/2016 0617   CREATININE 1.02 07/13/2016 0617   CALCIUM 8.1 (L) 07/13/2016 0617   PROT 5.1 (L) 07/13/2016 0617   ALBUMIN 2.3 (L) 07/13/2016 0617   AST 281 (H) 07/13/2016 0617   ALT 132 (H) 07/13/2016 0617   ALKPHOS 245 (H) 07/13/2016 0617   BILITOT 4.6 (H) 07/13/2016 0617   GFRNONAA >60 07/13/2016 0617   GFRAA >60 07/13/2016 0617   Lipase     Component Value Date/Time   LIPASE 88 (H) 07/12/2016 1826       Studies/Results: US Abdomen Complete  Result Date: 07/13/2016 CLINICAL DATA:  Abdominal pain,  epigastric pain EXAM: ABDOMEN ULTRASOUND COMPLETE COMPARISON:  None. FINDINGS: Gallbladder: Mildly abnormal gallbladder wall thickening at 4 mm. Sonographic Murphy's sign absent. Sludge is present in the gallbladder. Gallbladder slightly distended at 4 cm diameter. Common bile duct: Diameter: 6 mm Liver: Very minimally complex cystic lesion in the right hepatic lobe shown on image 39/8, subtle internal echoes, enhance through transmission. Coarse echogenic liver with poor sonic penetration compatible with diffuse hepatic steatosis. IVC: No abnormality visualized. Pancreas: Visualized portion unremarkable. Spleen: Not well visualized sonographically, but normal appearance on the CT scan from earlier today. Right Kidney: Length: 10.0 cm. Echogenicity within normal limits. No mass or hydronephrosis visualized. Left Kidney: Length: 10.2 cm. Echogenicity within normal limits. No mass or hydronephrosis visualized. Abdominal aorta: No aneurysm visualized. Other findings: None. IMPRESSION: 1. Thick-walled gallbladder, slightly distended, with internal sludge but no definite calculi. Sonographic Murphy's sign absent. Correlate clinically in assessing for cholecystitis. 2. Diffuse hepatic steatosis. 3. Small cyst in the liver, minimal complexity, likely benign. 4. Nonvisualization of the spleen due to difficulty obtaining sonic window ; the spleen appeared normal on the earlier CT scan. Electronically Signed   By: Van Clines M.D.   On: 07/13/2016 10:55   Ct Abdomen Pelvis W Contrast  Result Date: 07/13/2016 CLINICAL DATA:  Acute onset of right upper quadrant abdominal pain, and bilateral lower quadrant abdominal pain. Nausea and vomiting. Initial encounter. EXAM: CT ABDOMEN AND PELVIS WITH CONTRAST TECHNIQUE: Multidetector CT imaging of the abdomen and pelvis was performed using the standard protocol following bolus administration of intravenous contrast. CONTRAST:  174mL ISOVUE-300 IOPAMIDOL (ISOVUE-300)  INJECTION 61% COMPARISON:  None. FINDINGS: Lower chest: The visualized lung bases are grossly clear. The visualized portions of the mediastinum are unremarkable. Hepatobiliary: A 1.1 cm cyst is noted at the right hepatic lobe. The liver is otherwise unremarkable. Mild edema is noted at the gallbladder wall, with trace pericholecystic fluid, raising concern for mild acute cholecystitis. The gallbladder is mildly distended. The common bile duct remains normal in caliber. Pancreas: The pancreas is within normal limits. Spleen: The spleen is unremarkable in appearance. Adrenals/Urinary Tract: The adrenal glands are unremarkable in appearance. The kidneys are within normal limits. There is no evidence of hydronephrosis. No renal or ureteral stones are identified. No perinephric stranding is seen. Stomach/Bowel: The stomach is unremarkable in appearance. The small bowel is within normal limits. The appendix is normal in caliber, without evidence of appendicitis. The colon is unremarkable in appearance. Trace fluid is seen tracking along the right paracolic gutter. Vascular/Lymphatic: Diffuse calcification is noted along the common iliac arteries bilaterally, with mild calcification noted along the abdominal aorta. No retroperitoneal or pelvic sidewall lymphadenopathy is seen. Reproductive: The prostate is borderline prominent. The bladder is mildly distended and grossly unremarkable. Other: A small amount of free fluid is seen within the pelvis, of uncertain significance. Musculoskeletal: No acute osseous abnormalities are identified. The visualized musculature is unremarkable in appearance. IMPRESSION: 1. Mild edema at the gallbladder wall, with trace pericholecystic fluid, raising concern for mild acute cholecystitis. Gallbladder mildly distended in appearance. 2. Trace fluid tracking along the right paracolic gutter, and small amount of free fluid in the pelvis. This may reflect the gallbladder process. 3. 1.1 cm  hepatic cyst noted. 4. Scattered aortic atherosclerosis, with diffuse calcification along the common iliac arteries bilaterally. Electronically Signed   By: Garald Balding M.D.   On: 07/13/2016 01:32    Anti-infectives: Anti-infectives    Start     Dose/Rate Route Frequency Ordered Stop   07/13/16 1400  piperacillin-tazobactam (ZOSYN) IVPB 3.375 g     3.375 g 12.5 mL/hr over 240 Minutes Intravenous Every 8 hours 07/13/16 0312     07/13/16 0215  piperacillin-tazobactam (ZOSYN) IVPB 3.375 g     3.375 g 100 mL/hr over 30 Minutes Intravenous  Once 07/13/16 0214       Assessment/Plan Abdominal pain Nausea and vomiting transaminitis  Hyperbilirubinemia  - CT scan: distended gallbladder with wall thickening; possible acalculous cholecystitis  - RUQ U/S: GB wall thickening, distention, sludge, no calculi  - r/o choledocholithiasis, hepatitis panel pending, MRCP - GI consult  Alcohol abuse - CIWA    LOS: 0 days    Jill Alexanders , [facility] 07/13/2016, 12:43 PM Pager: [phone] Consults: [phone] Mon-Fri 7:00 am-4:30 pm Sat-Sun 7:00 am-11:30 am

## 2016-07-13 NOTE — Consult Note (Signed)
Reason for Consult:  Abdominal pain Referring Physician: [name] is an 55 y.o. male.  HPI: Three day history of abdominal pain characterized and sharp ans severe localized to the RLQ associated with nausea and vomiting, dark urine and scleral icterus.  CT demonstrates a distended, thick wall gallbladder without gallstones.  Has a history of heavy alcohol use, but no other medical problems.  History reviewed. No pertinent past medical history.  Past Surgical History:  Procedure Laterality Date  . ROTATOR CUFF REPAIR Right     Family History  Problem Relation Age of Onset  . Hypertension Sister   . Hypertension Brother     Social History:  reports that he quit smoking about 2 years ago. His smoking use included E-cigarettes. He has never used smokeless tobacco. He reports that he drinks about 3.6 oz of alcohol per week . He reports that he does not use drugs.  Allergies: No Known Allergies  Medications: I have reviewed the patient's current medications.  Results for orders placed or performed during the hospital encounter of 07/12/16 (from the past 48 hour(s))  Lipase, blood     Status: Abnormal   Collection Time: 07/12/16  6:26 PM  Result Value Ref Range   Lipase 88 (H) 11 - 51 U/L  Comprehensive metabolic panel     Status: Abnormal   Collection Time: 07/12/16  6:26 PM  Result Value Ref Range   Sodium 132 (L) 135 - 145 mmol/L   Potassium 3.0 (L) 3.5 - 5.1 mmol/L   Chloride 93 (L) 101 - 111 mmol/L   CO2 28 22 - 32 mmol/L   Glucose, Bld 135 (H) 65 - 99 mg/dL   BUN <5 (L) 6 - 20 mg/dL   Creatinine, Ser 1.07 0.61 - 1.24 mg/dL   Calcium 8.7 (L) 8.9 - 10.3 mg/dL   Total Protein 5.8 (L) 6.5 - 8.1 g/dL   Albumin 2.6 (L) 3.5 - 5.0 g/dL   AST 409 (H) 15 - 41 U/L   ALT 151 (H) 17 - 63 U/L   Alkaline Phosphatase 270 (H) 38 - 126 U/L   Total Bilirubin 6.6 (H) 0.3 - 1.2 mg/dL   GFR calc non Af Amer >60 >60 mL/min   GFR calc Af Amer >60 >60 mL/min    Comment: (NOTE) The  eGFR has been calculated using the CKD EPI equation. This calculation has not been validated in all clinical situations. eGFR's persistently <60 mL/min signify possible Chronic Kidney Disease.    Anion gap 11 5 - 15  CBC     Status: Abnormal   Collection Time: 07/12/16  6:26 PM  Result Value Ref Range   WBC 10.8 (H) 4.0 - 10.5 K/uL   RBC 4.21 (L) 4.22 - 5.81 MIL/uL   Hemoglobin 13.2 13.0 - 17.0 g/dL   HCT 38.6 (L) 39.0 - 52.0 %   MCV 91.7 78.0 - 100.0 fL   MCH 31.4 26.0 - 34.0 pg   MCHC 34.2 30.0 - 36.0 g/dL   RDW 16.1 (H) 11.5 - 15.5 %   Platelets 134 (L) 150 - 400 K/uL  Magnesium     Status: None   Collection Time: 07/12/16  6:26 PM  Result Value Ref Range   Magnesium 1.8 1.7 - 2.4 mg/dL  Phosphorus     Status: None   Collection Time: 07/12/16  6:26 PM  Result Value Ref Range   Phosphorus 3.5 2.5 - 4.6 mg/dL  Urinalysis, Routine w reflex microscopic  Status: Abnormal   Collection Time: 07/12/16  6:33 PM  Result Value Ref Range   Color, Urine BROWN (A) YELLOW    Comment: BIOCHEMICALS MAY BE AFFECTED BY COLOR   APPearance CLOUDY (A) CLEAR   Specific Gravity, Urine 1.010 1.005 - 1.030   pH >9.0 (H) 5.0 - 8.0   Glucose, UA 100 (A) NEGATIVE mg/dL   Hgb urine dipstick TRACE (A) NEGATIVE   Bilirubin Urine LARGE (A) NEGATIVE   Ketones, ur 15 (A) NEGATIVE mg/dL   Protein, ur 100 (A) NEGATIVE mg/dL   Nitrite POSITIVE (A) NEGATIVE   Leukocytes, UA MODERATE (A) NEGATIVE  Urine microscopic-add on     Status: Abnormal   Collection Time: 07/12/16  6:33 PM  Result Value Ref Range   Squamous Epithelial / LPF 0-5 (A) NONE SEEN   WBC, UA 6-30 0 - 5 WBC/hpf   RBC / HPF 0-5 0 - 5 RBC/hpf   Bacteria, UA FEW (A) NONE SEEN  I-Stat Troponin, ED (not at [facility])     Status: None   Collection Time: 07/12/16  6:45 PM  Result Value Ref Range   Troponin i, poc 0.00 0.00 - 0.08 ng/mL   Comment 3            Comment: Due to the release kinetics of cTnI, a negative result within the first  hours of the onset of symptoms does not rule out myocardial infarction with certainty. If myocardial infarction is still suspected, repeat the test at appropriate intervals.   Acetaminophen level     Status: Abnormal   Collection Time: 07/12/16  9:15 PM  Result Value Ref Range   Acetaminophen (Tylenol), Serum <10 (L) 10 - 30 ug/mL    Comment:        THERAPEUTIC CONCENTRATIONS VARY SIGNIFICANTLY. A RANGE OF 10-30 ug/mL MAY BE AN EFFECTIVE CONCENTRATION FOR MANY PATIENTS. HOWEVER, SOME ARE BEST TREATED AT CONCENTRATIONS OUTSIDE THIS RANGE. ACETAMINOPHEN CONCENTRATIONS >150 ug/mL AT 4 HOURS AFTER INGESTION AND >50 ug/mL AT 12 HOURS AFTER INGESTION ARE OFTEN ASSOCIATED WITH TOXIC REACTIONS.   Protime-INR     Status: None   Collection Time: 07/12/16  9:15 PM  Result Value Ref Range   Prothrombin Time 12.8 11.4 - 15.2 seconds   INR 0.96   I-Stat CG4 Lactic Acid, ED     Status: None   Collection Time: 07/12/16  9:30 PM  Result Value Ref Range   Lactic Acid, Venous 1.35 0.5 - 1.9 mmol/L    Ct Abdomen Pelvis W Contrast  Result Date: 07/13/2016 CLINICAL DATA:  Acute onset of right upper quadrant abdominal pain, and bilateral lower quadrant abdominal pain. Nausea and vomiting. Initial encounter. EXAM: CT ABDOMEN AND PELVIS WITH CONTRAST TECHNIQUE: Multidetector CT imaging of the abdomen and pelvis was performed using the standard protocol following bolus administration of intravenous contrast. CONTRAST:  120m ISOVUE-300 IOPAMIDOL (ISOVUE-300) INJECTION 61% COMPARISON:  None. FINDINGS: Lower chest: The visualized lung bases are grossly clear. The visualized portions of the mediastinum are unremarkable. Hepatobiliary: A 1.1 cm cyst is noted at the right hepatic lobe. The liver is otherwise unremarkable. Mild edema is noted at the gallbladder wall, with trace pericholecystic fluid, raising concern for mild acute cholecystitis. The gallbladder is mildly distended. The common bile duct remains  normal in caliber. Pancreas: The pancreas is within normal limits. Spleen: The spleen is unremarkable in appearance. Adrenals/Urinary Tract: The adrenal glands are unremarkable in appearance. The kidneys are within normal limits. There is no evidence of hydronephrosis.  No renal or ureteral stones are identified. No perinephric stranding is seen. Stomach/Bowel: The stomach is unremarkable in appearance. The small bowel is within normal limits. The appendix is normal in caliber, without evidence of appendicitis. The colon is unremarkable in appearance. Trace fluid is seen tracking along the right paracolic gutter. Vascular/Lymphatic: Diffuse calcification is noted along the common iliac arteries bilaterally, with mild calcification noted along the abdominal aorta. No retroperitoneal or pelvic sidewall lymphadenopathy is seen. Reproductive: The prostate is borderline prominent. The bladder is mildly distended and grossly unremarkable. Other: A small amount of free fluid is seen within the pelvis, of uncertain significance. Musculoskeletal: No acute osseous abnormalities are identified. The visualized musculature is unremarkable in appearance. IMPRESSION: 1. Mild edema at the gallbladder wall, with trace pericholecystic fluid, raising concern for mild acute cholecystitis. Gallbladder mildly distended in appearance. 2. Trace fluid tracking along the right paracolic gutter, and small amount of free fluid in the pelvis. This may reflect the gallbladder process. 3. 1.1 cm hepatic cyst noted. 4. Scattered aortic atherosclerosis, with diffuse calcification along the common iliac arteries bilaterally. Electronically Signed   By: Garald Balding M.D.   On: 07/13/2016 01:32    Review of Systems  Constitutional: Negative for chills and fever.  Gastrointestinal: Positive for abdominal pain, nausea and vomiting.  All other systems reviewed and are negative.  Blood pressure 108/67, pulse 78, temperature 99 F (37.2 C), resp.  rate 18, height '5\' 7"'  (1.702 m), weight 56.7 kg (124 lb 14.4 oz), SpO2 99 %. Physical Exam  Constitutional: He is oriented to person, place, and time. He appears well-developed and well-nourished.  HENT:  Head: Normocephalic and atraumatic.  Eyes: Conjunctivae and EOM are normal. Pupils are equal, round, and reactive to light. Scleral icterus is present.  Cardiovascular: Normal rate, regular rhythm and normal heart sounds.   Respiratory: Effort normal and breath sounds normal.  GI: Soft. Normal appearance and bowel sounds are normal. There is tenderness in the right upper quadrant, right lower quadrant, epigastric area and left lower quadrant. There is no rigidity, no rebound and no guarding.    Generalized mild abdominal tenderness, worse spot is epigastric  Musculoskeletal: Normal range of motion.  Neurological: He is alert and oriented to person, place, and time. He has normal reflexes.    Assessment/Plan: Hyperbilirubinemia, transaminitis, CT suggestion of cholecystitis which would be acalculous.  This could also be acute hepatitis, alcoholic or otherwise.   His pain pattern on examination does not suggest acute cholecystitis, but the LFT abnormalities could be a manifestation of a passed stone.  An ultrasound could be helpful, but a HIDA scan could be non-diagnostic because of the hyperbilirubinemia.  Will go ahead and get GI involved for possible ERCP, and also will try to get ultrasound today to confirm CT impression.  Jeff Romero 07/13/2016, 6:06 AM

## 2016-07-13 NOTE — H&P (Signed)
History and Physical    Jeff Romero [id number] DOB: [birth year] DOA: 07/12/2016  PCP: No PCP Per Patient  Patient coming from: Home  Chief Complaint: Nausea, vomiting, and epigastric abdominal pain  HPI: Jeff Romero is a 55 y.o. male with alcohol abuse presents to the ED with complaints of nausea and vomiting along with epigastric abdominal pain started 3 days ago. Denies any blood in the vomitus or any diarrhea. Pt started to have pain in his epigastric region, stabbing in nature no relation to food nonradiating with multiple episodes of vomiting. In the ER CT a/p showed concerns for cholecystitis. . On call general surgery has been consulted. He admits to drinking 6 - 8 beers a day for 9 years. Pain improved with pain only medications. Patient is not in distress. Denies chest pain or shortness of breath.  ED Course: While in the ED, sodium 132, potassium 3.0, BUN <5, alkaline phosphatase 270, albumin 2.6, lipase 88, AST 409, ALT 151, WBC 10.8, platelets 134, glucose 135. CT a/p shows concerns for cholecystitis. He will be admitted for treatment of abdominal pain.  Review of Systems: As per HPI, rest all negative.   History reviewed. No pertinent past medical history.  Past Surgical History:  Procedure Laterality Date  . ROTATOR CUFF REPAIR Right      reports that he quit smoking about 2 years ago. He has never used smokeless tobacco. He reports that he drinks alcohol. He reports that he does not use drugs.  No Known Allergies  Family history - brother and sister has hypertension.  Prior to Admission medications   Medication Sig Start Date End Date Taking? Authorizing Provider  calcium carbonate (TUMS - DOSED IN MG ELEMENTAL CALCIUM) 500 MG chewable tablet Chew 1 tablet by mouth daily as needed for indigestion or heartburn.   Yes Historical Provider, MD  famotidine (PEPCID) 20 MG tablet Take 20 mg by mouth daily as needed for heartburn or indigestion.   Yes Historical Provider, MD      Physical Exam:Moderately built and nourished. Vitals:   07/12/16 2230 07/12/16 2300 07/12/16 2330 07/13/16 0000  BP: 117/69 114/83 117/75 115/75  Pulse: 89 93 88 89  Resp: 15 13 14 17   Temp:      TempSrc:      SpO2: 99% 98% 99% 98%  Weight:      Height:          Constitutional: Appears calm and comfortable Vitals:   07/12/16 2230 07/12/16 2300 07/12/16 2330 07/13/16 0000  BP: 117/69 114/83 117/75 115/75  Pulse: 89 93 88 89  Resp: 15 13 14 17   Temp:      TempSrc:      SpO2: 99% 98% 99% 98%  Weight:      Height:       Eyes: Anicteric No pallor ENMT: No discharge from ears, eyes, nose, and mouth. Neck: No JVD. No neck rigidity. Respiratory: No rhonchi. No crepitations. Cardiovascular: No murmurs heard. S1-S2 heard. Abdomen: Normal bowel sounds heard. No pain in the abd since pain meds have been given. Musculoskeletal: No edema. No joint effusion. Skin: No rashes. Skin appears warm. Neurologic: Alert, awake, and oriented x3. He follows commands.Moves all extremities. Psychiatric: Appears normal. Affect normal.   Labs on Admission: I have personally reviewed following labs and imaging studies  CBC:  Recent Labs Lab 07/12/16 1826  WBC 10.8*  HGB 13.2  HCT 38.6*  MCV 91.7  PLT [id number]*   Basic Metabolic Panel:  Recent Labs Lab  07/12/16 1826  NA 132*  K 3.0*  CL 93*  CO2 28  GLUCOSE 135*  BUN <5*  CREATININE 1.07  CALCIUM 8.7*  MG 1.8  PHOS 3.5   GFR: Estimated Creatinine Clearance: 67.5 mL/min (by C-G formula based on SCr of 1.07 mg/dL). Liver Function Tests:  Recent Labs Lab 07/12/16 1826  AST 409*  ALT 151*  ALKPHOS 270*  BILITOT 6.6*  PROT 5.8*  ALBUMIN 2.6*    Recent Labs Lab 07/12/16 1826  LIPASE 88*   No results for input(s): AMMONIA in the last 168 hours. Coagulation Profile:  Recent Labs Lab 07/12/16 2115  INR 0.96   Cardiac Enzymes: No results for input(s): CKTOTAL, CKMB, CKMBINDEX, TROPONINI in the last 168  hours. BNP (last 3 results) No results for input(s): PROBNP in the last 8760 hours. HbA1C: No results for input(s): HGBA1C in the last 72 hours. CBG: No results for input(s): GLUCAP in the last 168 hours. Lipid Profile: No results for input(s): CHOL, HDL, LDLCALC, TRIG, CHOLHDL, LDLDIRECT in the last 72 hours. Thyroid Function Tests: No results for input(s): TSH, T4TOTAL, FREET4, T3FREE, THYROIDAB in the last 72 hours. Anemia Panel: No results for input(s): VITAMINB12, FOLATE, FERRITIN, TIBC, IRON, RETICCTPCT in the last 72 hours. Urine analysis:    Component Value Date/Time   COLORURINE BROWN (A) 07/12/2016 1833   APPEARANCEUR CLOUDY (A) 07/12/2016 1833   LABSPEC 1.010 07/12/2016 1833   PHURINE >9.0 (H) 07/12/2016 1833   GLUCOSEU 100 (A) 07/12/2016 1833   HGBUR TRACE (A) 07/12/2016 1833   BILIRUBINUR LARGE (A) 07/12/2016 1833   KETONESUR 15 (A) 07/12/2016 1833   PROTEINUR 100 (A) 07/12/2016 1833   NITRITE POSITIVE (A) 07/12/2016 1833   LEUKOCYTESUR MODERATE (A) 07/12/2016 1833   Sepsis Labs: @LABRCNTIP (procalcitonin:4,lacticidven:4) )No results found for this or any previous visit (from the past 240 hour(s)).   Radiological Exams on Admission: Ct Abdomen Pelvis W Contrast  Result Date: 07/13/2016 CLINICAL DATA:  Acute onset of right upper quadrant abdominal pain, and bilateral lower quadrant abdominal pain. Nausea and vomiting. Initial encounter. EXAM: CT ABDOMEN AND PELVIS WITH CONTRAST TECHNIQUE: Multidetector CT imaging of the abdomen and pelvis was performed using the standard protocol following bolus administration of intravenous contrast. CONTRAST:  142mL ISOVUE-300 IOPAMIDOL (ISOVUE-300) INJECTION 61% COMPARISON:  None. FINDINGS: Lower chest: The visualized lung bases are grossly clear. The visualized portions of the mediastinum are unremarkable. Hepatobiliary: A 1.1 cm cyst is noted at the right hepatic lobe. The liver is otherwise unremarkable. Mild edema is noted at the  gallbladder wall, with trace pericholecystic fluid, raising concern for mild acute cholecystitis. The gallbladder is mildly distended. The common bile duct remains normal in caliber. Pancreas: The pancreas is within normal limits. Spleen: The spleen is unremarkable in appearance. Adrenals/Urinary Tract: The adrenal glands are unremarkable in appearance. The kidneys are within normal limits. There is no evidence of hydronephrosis. No renal or ureteral stones are identified. No perinephric stranding is seen. Stomach/Bowel: The stomach is unremarkable in appearance. The small bowel is within normal limits. The appendix is normal in caliber, without evidence of appendicitis. The colon is unremarkable in appearance. Trace fluid is seen tracking along the right paracolic gutter. Vascular/Lymphatic: Diffuse calcification is noted along the common iliac arteries bilaterally, with mild calcification noted along the abdominal aorta. No retroperitoneal or pelvic sidewall lymphadenopathy is seen. Reproductive: The prostate is borderline prominent. The bladder is mildly distended and grossly unremarkable. Other: A small amount of free fluid is seen within the  pelvis, of uncertain significance. Musculoskeletal: No acute osseous abnormalities are identified. The visualized musculature is unremarkable in appearance. IMPRESSION: 1. Mild edema at the gallbladder wall, with trace pericholecystic fluid, raising concern for mild acute cholecystitis. Gallbladder mildly distended in appearance. 2. Trace fluid tracking along the right paracolic gutter, and small amount of free fluid in the pelvis. This may reflect the gallbladder process. 3. 1.1 cm hepatic cyst noted. 4. Scattered aortic atherosclerosis, with diffuse calcification along the common iliac arteries bilaterally. Electronically Signed   By: Garald Balding M.D.   On: 07/13/2016 01:32    EKG: Independently reviewed. Sinus tachycardia with occasional Premature ventricular  complexes  Assessment/Plan Principal Problem:   Abdominal pain Active Problems:   Transaminitis   Nausea & vomiting   Alcohol abuse    1. Abdominal pain with nausea vomiting - CT scan shows concerns for cholecystitis. Will keep NPO for now. Start on IV fluids and abx. Since he have been given pain meds, his pain has subsided. General surgery has been consulted. 2. Elevated LFTs/Transaminitis - probably secondary to alcohol abuse/alcoholic hepatitis. CT scan also shows gallbladder pathology. Will check acute hepatitis panel. Closely follow LFTs. 3. Alcohol abuse - Will place on CIWA protocol. Social work consult for alcohol abuse. 4. Mild hyponatremia probably from dehydration and alcohol abuse - follow metabolic panel closely. 5. Mild hypokalemia - probably from vomiting. Replace and recheck. Check magnesium.   DVT prophylaxis: SCDs Code Status: Full  Family Communication: Family at bedside Disposition Plan: Discharge home once improved  Consults called: General surgery Admission status: Admit to inpatient    Gean Birchwood, MD [facility] Pager [phone]. If 7PM-7AM, please contact night-coverage [email or web address] Password [email or web address] 07/13/2016, 2:40 AM   By signing my name below, I, Hilbert Odor, attest that this documentation has been prepared under the direction and in the presence of Gean Birchwood, MD. Electronically signed: Hilbert Odor, Scribe.  07/13/16, 2:50 AM

## 2016-07-13 NOTE — ED Provider Notes (Signed)
I assumed care of this patient from Dr. Billy Fischer at 0100.  Please see their note for further details of Hx, PE.  Briefly patient is a 55 y.o. male with a Abdominal Pain and Emesis with work up revealing biliary obstruction concerning for acute cholecystitis, alcoholic hepatitis, pancreatic mass causing obstruction. Currently pending CT scan of the abdomen.  CT scan revealed evidence concerning for acute cholecystitis without cholelithiasis. Patient started on Zosyn. Discussed case with surgery who requested medicine admission and they will follow along. Appreciate hospitalist admission.    Disposition: Admit  Condition: Stable      Fatima Blank, MD 07/13/16 [phone]

## 2016-07-13 NOTE — Progress Notes (Signed)
Initial Nutrition Assessment  DOCUMENTATION CODES:   Non-severe (moderate) malnutrition in context of chronic illness  INTERVENTION:    Advance diet as medically appropriate, add supplements when able  NUTRITION DIAGNOSIS:   Malnutrition related to chronic illness (alcoholism) as evidenced by estimated needs  GOAL:   Patient will meet greater than or equal to 90% of their needs  MONITOR:   Diet advancement, PO intake, Supplement acceptance, Labs, Weight trends, I & O's  REASON FOR ASSESSMENT:   Malnutrition Screening Tool  ASSESSMENT:   55 y.o. Male with a history of alcohol abuse. He presented with nausea, vomiting and epigastric pain and was found to have possible cholecystitis. He had associated elevations in AST/ALT and alkaline phosphatase with elevated bilirubin (indirect and direct).  Spoke with pt and pt's wife at bedside. Reports he was eating about 2 meals per day PTA. States he's lost about 5 lbs >> time frame unknown. Currently NPO, however, would benefit from supplements when diet advanced. Labs and medications reviewed >> on CIWA Protocol; receiving folvite, thiamine and MVI.  Nutrition-Focused physical exam completed. Findings are moderate fat depletion, moderate muscle depletion, and no edema.   Diet Order:  Diet NPO time specified  Skin:  Reviewed, no issues   CBG (last 3)   Recent Labs  07/13/16 0631  GLUCAP 109*    Last BM:  10/8  Height:   Ht Readings from Last 1 Encounters:  07/13/16 5\' 7"  (1.702 m)    Weight:   Wt Readings from Last 1 Encounters:  07/13/16 124 lb 14.4 oz (56.7 kg)    Ideal Body Weight:  67.2 kg  BMI:  Body mass index is 19.56 kg/m.  Estimated Nutritional Needs:   Kcal:  1800-2000  Protein:  95-105 gm  Fluid:  1.8-2.0 L  EDUCATION NEEDS:   No education needs identified at this time  Arthur Holms, RD, LDN Pager #: [phone] After-Hours Pager #: [phone]

## 2016-07-14 DIAGNOSIS — E440 Moderate protein-calorie malnutrition: Secondary | ICD-10-CM

## 2016-07-14 LAB — GLUCOSE, CAPILLARY
GLUCOSE-CAPILLARY: 116 mg/dL — AB (ref 65–99)
Glucose-Capillary: 120 mg/dL — ABNORMAL HIGH (ref 65–99)
Glucose-Capillary: 65 mg/dL (ref 65–99)
Glucose-Capillary: 70 mg/dL (ref 65–99)

## 2016-07-14 LAB — CBC
HEMATOCRIT: 32.4 % — AB (ref 39.0–52.0)
Hemoglobin: 10.8 g/dL — ABNORMAL LOW (ref 13.0–17.0)
MCH: 31.4 pg (ref 26.0–34.0)
MCHC: 33.3 g/dL (ref 30.0–36.0)
MCV: 94.2 fL (ref 78.0–100.0)
PLATELETS: 115 10*3/uL — AB (ref 150–400)
RBC: 3.44 MIL/uL — ABNORMAL LOW (ref 4.22–5.81)
RDW: 16.6 % — AB (ref 11.5–15.5)
WBC: 6.3 10*3/uL (ref 4.0–10.5)

## 2016-07-14 LAB — HEPATITIS PANEL, ACUTE
HCV Ab: <0.1 s/co ratio (ref 0.0–0.9)
HCV Ab: <0.1 s/co ratio (ref 0.0–0.9)
HEP A IGM: NEGATIVE
HEP A IGM: NEGATIVE
HEP B C IGM: NEGATIVE
Hep B C IgM: NEGATIVE
Hepatitis B Surface Ag: NEGATIVE
Hepatitis B Surface Ag: NEGATIVE

## 2016-07-14 LAB — COMPREHENSIVE METABOLIC PANEL
ALBUMIN: 2.2 g/dL — AB (ref 3.5–5.0)
ALT: 96 U/L — AB (ref 17–63)
AST: 113 U/L — AB (ref 15–41)
Alkaline Phosphatase: 229 U/L — ABNORMAL HIGH (ref 38–126)
Anion gap: 11 (ref 5–15)
BUN: 8 mg/dL (ref 6–20)
CHLORIDE: 99 mmol/L — AB (ref 101–111)
CO2: 25 mmol/L (ref 22–32)
CREATININE: 1.17 mg/dL (ref 0.61–1.24)
Calcium: 8.3 mg/dL — ABNORMAL LOW (ref 8.9–10.3)
GFR calc Af Amer: >60 mL/min (ref >60)
GLUCOSE: 71 mg/dL (ref 65–99)
POTASSIUM: 3.6 mmol/L (ref 3.5–5.1)
Sodium: 135 mmol/L (ref 135–145)
Total Bilirubin: 2.9 mg/dL — ABNORMAL HIGH (ref 0.3–1.2)
Total Protein: 5.3 g/dL — ABNORMAL LOW (ref 6.5–8.1)

## 2016-07-14 NOTE — Progress Notes (Signed)
Subjective: No abd pain. No n/v. Hungry.   Objective: Vital signs in last 24 hours: Temp:  [97.8 F (36.6 C)-98.2 F (36.8 C)] 98.1 F (36.7 C) (10/10 0608) Pulse Rate:  [85-96] 85 (10/10 0608) Resp:  [17-18] 17 (10/10 0608) BP: (113-124)/(52-86) 113/52 (10/10 0608) SpO2:  [98 %-100 %] 99 % (10/10 0608) Last BM Date: 07/12/16  Intake/Output from previous day: 10/09 0701 - 10/10 0700 In: 1090 [I.V.:940; IV Piggyback:150] Out: 900 [Urine:900] Intake/Output this shift: No intake/output data recorded.  Alert, nontoxic cta Soft, nd, nt,   Lab Results:   Recent Labs  07/13/16 0617 07/14/16 0743  WBC 5.8 6.3  HGB 10.6* 10.8*  HCT 30.8* 32.4*  PLT 111* 115*   CBC Latest Ref Rng & Units 07/14/2016 07/13/2016 07/12/2016  WBC 4.0 - 10.5 K/uL 6.3 5.8 10.8(H)  Hemoglobin 13.0 - 17.0 g/dL 10.8(L) 10.6(L) 13.2  Hematocrit 39.0 - 52.0 % 32.4(L) 30.8(L) 38.6(L)  Platelets 150 - 400 K/uL 115(L) 111(L) 134(L)    BMET  Recent Labs  07/13/16 0617 07/14/16 0743  NA 133* 135  K 4.0 3.6  CL 101 99*  CO2 27 25  GLUCOSE 100* 71  BUN <5* 8  CREATININE 1.02 1.17  CALCIUM 8.1* 8.3*   Hepatic Function Latest Ref Rng & Units 07/14/2016 07/13/2016 07/12/2016  Total Protein 6.5 - 8.1 g/dL 5.3(L) 5.1(L) 5.8(L)  Albumin 3.5 - 5.0 g/dL 2.2(L) 2.3(L) 2.6(L)  AST 15 - 41 U/L 113(H) 281(H) 409(H)  ALT 17 - 63 U/L 96(H) 132(H) 151(H)  Alk Phosphatase 38 - 126 U/L 229(H) 245(H) 270(H)  Total Bilirubin 0.3 - 1.2 mg/dL 2.9(H) 4.6(H) 6.6(H)  Bilirubin, Direct 0.1 - 0.5 mg/dL - 3.0(H) -    PT/INR  Recent Labs  07/12/16 2115  LABPROT 12.8  INR 0.96   ABG No results for input(s): PHART, HCO3 in the last 72 hours.  Invalid input(s): PCO2, PO2  Studies/Results: US Abdomen Complete  Result Date: 07/13/2016 CLINICAL DATA:  Abdominal pain, epigastric pain EXAM: ABDOMEN ULTRASOUND COMPLETE COMPARISON:  None. FINDINGS: Gallbladder: Mildly abnormal gallbladder wall thickening at 4 mm.  Sonographic Murphy's sign absent. Sludge is present in the gallbladder. Gallbladder slightly distended at 4 cm diameter. Common bile duct: Diameter: 6 mm Liver: Very minimally complex cystic lesion in the right hepatic lobe shown on image 39/8, subtle internal echoes, enhance through transmission. Coarse echogenic liver with poor sonic penetration compatible with diffuse hepatic steatosis. IVC: No abnormality visualized. Pancreas: Visualized portion unremarkable. Spleen: Not well visualized sonographically, but normal appearance on the CT scan from earlier today. Right Kidney: Length: 10.0 cm. Echogenicity within normal limits. No mass or hydronephrosis visualized. Left Kidney: Length: 10.2 cm. Echogenicity within normal limits. No mass or hydronephrosis visualized. Abdominal aorta: No aneurysm visualized. Other findings: None. IMPRESSION: 1. Thick-walled gallbladder, slightly distended, with internal sludge but no definite calculi. Sonographic Murphy's sign absent. Correlate clinically in assessing for cholecystitis. 2. Diffuse hepatic steatosis. 3. Small cyst in the liver, minimal complexity, likely benign. 4. Nonvisualization of the spleen due to difficulty obtaining sonic window ; the spleen appeared normal on the earlier CT scan. Electronically Signed   By: Van Clines M.D.   On: 07/13/2016 10:55   Ct Abdomen Pelvis W Contrast  Result Date: 07/13/2016 CLINICAL DATA:  Acute onset of right upper quadrant abdominal pain, and bilateral lower quadrant abdominal pain. Nausea and vomiting. Initial encounter. EXAM: CT ABDOMEN AND PELVIS WITH CONTRAST TECHNIQUE: Multidetector CT imaging of the abdomen and pelvis was performed  using the standard protocol following bolus administration of intravenous contrast. CONTRAST:  142m ISOVUE-300 IOPAMIDOL (ISOVUE-300) INJECTION 61% COMPARISON:  None. FINDINGS: Lower chest: The visualized lung bases are grossly clear. The visualized portions of the mediastinum are  unremarkable. Hepatobiliary: A 1.1 cm cyst is noted at the right hepatic lobe. The liver is otherwise unremarkable. Mild edema is noted at the gallbladder wall, with trace pericholecystic fluid, raising concern for mild acute cholecystitis. The gallbladder is mildly distended. The common bile duct remains normal in caliber. Pancreas: The pancreas is within normal limits. Spleen: The spleen is unremarkable in appearance. Adrenals/Urinary Tract: The adrenal glands are unremarkable in appearance. The kidneys are within normal limits. There is no evidence of hydronephrosis. No renal or ureteral stones are identified. No perinephric stranding is seen. Stomach/Bowel: The stomach is unremarkable in appearance. The small bowel is within normal limits. The appendix is normal in caliber, without evidence of appendicitis. The colon is unremarkable in appearance. Trace fluid is seen tracking along the right paracolic gutter. Vascular/Lymphatic: Diffuse calcification is noted along the common iliac arteries bilaterally, with mild calcification noted along the abdominal aorta. No retroperitoneal or pelvic sidewall lymphadenopathy is seen. Reproductive: The prostate is borderline prominent. The bladder is mildly distended and grossly unremarkable. Other: A small amount of free fluid is seen within the pelvis, of uncertain significance. Musculoskeletal: No acute osseous abnormalities are identified. The visualized musculature is unremarkable in appearance. IMPRESSION: 1. Mild edema at the gallbladder wall, with trace pericholecystic fluid, raising concern for mild acute cholecystitis. Gallbladder mildly distended in appearance. 2. Trace fluid tracking along the right paracolic gutter, and small amount of free fluid in the pelvis. This may reflect the gallbladder process. 3. 1.1 cm hepatic cyst noted. 4. Scattered aortic atherosclerosis, with diffuse calcification along the common iliac arteries bilaterally. Electronically Signed    By: JGarald BaldingM.D.   On: 07/13/2016 01:32   Mr 3d Recon At Scanner  Result Date: 07/13/2016 CLINICAL DATA:  Elevated liver function tests. Epigastric pain. Alcohol abuse. EXAM: MRI ABDOMEN WITHOUT AND WITH CONTRAST (INCLUDING MRCP) TECHNIQUE: Multiplanar multisequence MR imaging of the abdomen was performed both before and after the administration of intravenous contrast. Heavily T2-weighted images of the biliary and pancreatic ducts were obtained, and three-dimensional MRCP images were rendered by post processing. CONTRAST:  155mMULTIHANCE GADOBENATE DIMEGLUMINE 529 MG/ML IV SOLN COMPARISON:  Ultrasound 07/13/2016.  CT 07/13/2016. FINDINGS: Portions of exam are mildly motion degraded. Lower chest: Normal heart size without pericardial or pleural effusion. Hepatobiliary: T2 hypo intensity within the liver with relatively similar signal on in and out of phase imaging, suggesting a combination of iron deposition and steatosis. A hepatic dome 9 mm T2 hyperintense hypervascular lesion on image 25/ series 1302 is consistent with a hemangioma. There is also a right hepatic lobe cyst. No cirrhosis. The gallbladder is mildly distended with mild wall thickening. No stones identified. Mild gallbladder wall hyper enhancement. No biliary duct dilatation. No evidence of choledocholithiasis. Common duct 5 mm. Pancreas:  Normal, without mass or ductal dilatation. Spleen:  Iron deposition in the spleen. Adrenals/Urinary Tract: Normal adrenal glands. Normal kidneys, without hydronephrosis. Stomach/Bowel: Proximal gastric underdistention. Large and small bowel loops unremarkable. Vascular/Lymphatic: Aortic atherosclerosis. No retroperitoneal or retrocrural adenopathy. Other:  No ascites. Musculoskeletal: No acute osseous abnormality. IMPRESSION: 1. Gallbladder wall thickening and mild mucosal hyperenhancement. No gallstones. Given absence of sonographic Murphy's sign on ultrasound, acalculus cholecystitis is felt unlikely  but cannot be entirely excluded. 2. 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 No cirrhosis. 3. Hepatic dome hemangioma.  No suspicious liver lesion. Electronically Signed   By: Abigail Miyamoto M.D.   On: 07/13/2016 21:27   Mr Jeananne Rama W/wo Cm/mrcp  Result Date: 07/13/2016 CLINICAL DATA:  Elevated liver function tests. Epigastric pain. Alcohol abuse. EXAM: MRI ABDOMEN WITHOUT AND WITH CONTRAST (INCLUDING MRCP) TECHNIQUE: Multiplanar multisequence MR imaging of the abdomen was performed both before and after the administration of intravenous contrast. Heavily T2-weighted images of the biliary and pancreatic ducts were obtained, and three-dimensional MRCP images were rendered by post processing. CONTRAST:  75m MULTIHANCE GADOBENATE DIMEGLUMINE 529 MG/ML IV SOLN COMPARISON:  Ultrasound 07/13/2016.  CT 07/13/2016. FINDINGS: Portions of exam are mildly motion degraded. Lower chest: Normal heart size without pericardial or pleural effusion. Hepatobiliary: T2 hypo intensity within the liver with relatively similar signal on in and out of phase imaging, suggesting a combination of iron deposition and steatosis. A hepatic dome 9 mm T2 hyperintense hypervascular lesion on image 25/ series 1302 is consistent with a hemangioma. There is also a right hepatic lobe cyst. No cirrhosis. The gallbladder is mildly distended with mild wall thickening. No stones identified. Mild gallbladder wall hyper enhancement. No biliary duct dilatation. No evidence of choledocholithiasis. Common duct 5 mm. Pancreas:  Normal, without mass or ductal dilatation. Spleen:  Iron deposition in the spleen. Adrenals/Urinary Tract: Normal adrenal glands. Normal kidneys, without hydronephrosis. Stomach/Bowel: Proximal gastric underdistention. Large and small bowel loops unremarkable. Vascular/Lymphatic: Aortic atherosclerosis. No retroperitoneal or retrocrural adenopathy. Other:  No ascites. Musculoskeletal: No acute osseous abnormality.  IMPRESSION: 1. Gallbladder wall thickening and mild mucosal hyperenhancement. No gallstones. Given absence of sonographic Murphy's sign on ultrasound, acalculus cholecystitis is felt unlikely but cannot be entirely excluded. 2. 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 No cirrhosis. 3. Hepatic dome hemangioma.  No suspicious liver lesion. Electronically Signed   By: KAbigail MiyamotoM.D.   On: 07/13/2016 21:27    Anti-infectives: Anti-infectives    Start     Dose/Rate Route Frequency Ordered Stop   07/13/16 1400  piperacillin-tazobactam (ZOSYN) IVPB 3.375 g     3.375 g 12.5 mL/hr over 240 Minutes Intravenous Every 8 hours 07/13/16 0312     07/13/16 0215  piperacillin-tazobactam (ZOSYN) IVPB 3.375 g     3.375 g 100 mL/hr over 30 Minutes Intravenous  Once 07/13/16 0214        Assessment/Plan: Epigastric pain Elevated LFTs Alcohol abuse  No fever, no wbc, no abd pain. LFTs trending down. MRI showed no CBD stones, no gallstones. No gallstones on any imaging. GB wall thickening is really NOT that impressive on any imaging. Never really had a wbc - it was mildly elevated on admission but his hgb/hct was also elevated reflecting a degree of dehydration. It would be very unusual to present with acalculous cholecystitis.   rec starting diet and seeing how he does Stop abx If does well today can go home in AM Repeat LFTs in am Alcohol counseling  ELeighton Ruff WRedmond Pulling MD, FACS General, Bariatric, & Minimally Invasive Surgery [facility] [place]  LOS: 1 day    WGayland Curry10/07/2016

## 2016-07-14 NOTE — Progress Notes (Signed)
PROGRESS NOTE    Kavell Saunder  [id number] DOB: June 15, 1961 DOA: 07/12/2016 PCP: No PCP Per Patient   Brief Narrative: Royel Borstad is a 55 y.o. male with a history of alcohol abuse. He presented with nausea, vomiting and epigastric pain and was found to have possible cholecystitis. He had associated elevations in AST/ALT and alkaline phosphatase with elevated bilirubin (indirect and direct)   Assessment & Plan:   Principal Problem:   Abdominal pain Active Problems:   Transaminitis   Nausea & vomiting   Alcohol abuse   Malnutrition of moderate degree   Abdominal pain with nausea and vomiting Sludge on ultrasound. Patient has improved with associated improvement in AST, ALT, bilirubin and alkaline phosphatase. Possible this was a passed stone. -advancing diet per surgery -pain management  Transaminitis Elevated Alkaline phosphatase Hyperbilirubinemia Possibly secondary to alcoholic hepatitis vs sequelae from possible choledocholithiasis. Hepatitis panel negative  Hypomagnesemia -replete  Alcohol abuse -continue CIWA -continue multivitamin, thiamine, folic acid  Hypokalemia -resolved   Thrombocytopenia Likely secondary to chronic alcohol use. Stable.   DVT prophylaxis: SCDs Code Status: Full code Family Communication: None at bedside Disposition Plan: Discharge home once improved   Consultants:   Surgery  GI per surgery  Procedures:   None  Antimicrobials:  Zosyn (10/9>>   Subjective: No events overnight. No abdominal pain.  Objective: Vitals:   07/13/16 0337 07/13/16 1329 07/14/16 0037 07/14/16 0608  BP: 108/67 124/86 122/73 (!) 113/52  Pulse: 78 96 92 85  Resp: 18 18 18 17   Temp: 99 F (37.2 C) 98.2 F (36.8 C) 97.8 F (36.6 C) 98.1 F (36.7 C)  TempSrc:  Oral Oral Oral  SpO2: 99% 98% 100% 99%  Weight: 56.7 kg (124 lb 14.4 oz)     Height: 5\' 7"  (1.702 m)       Intake/Output Summary (Last 24 hours) at 07/14/16 1450 Last data filed  at 07/14/16 1142  Gross per 24 hour  Intake             1450 ml  Output                0 ml  Net             1450 ml   Filed Weights   07/12/16 1824 07/13/16 0337  Weight: 61.2 kg (135 lb) 56.7 kg (124 lb 14.4 oz)    Examination:  General exam: Appears calm and comfortable. Thin appearing Respiratory system: Clear to auscultation. Respiratory effort normal. Cardiovascular system: S1 & S2 heard, RRR. No murmurs, rubs, gallops or clicks. Gastrointestinal system: Abdomen is nondistended, soft and nontender. No organomegaly or masses felt. Normal bowel sounds heard. Central nervous system: Alert and oriented. No focal neurological deficits. No asterixis. No tremor Extremities: No edema. No calf tenderness Skin: No cyanosis. No rashes Psychiatry: Judgement and insight appear normal. Mood & affect appropriate.     Data Reviewed: I have personally reviewed following labs and imaging studies  CBC:  Recent Labs Lab 07/12/16 1826 07/13/16 0617 07/14/16 0743  WBC 10.8* 5.8 6.3  NEUTROABS  --  4.2  --   HGB 13.2 10.6* 10.8*  HCT 38.6* 30.8* 32.4*  MCV 91.7 92.5 94.2  PLT 134* 111* [id number]*   Basic Metabolic Panel:  Recent Labs Lab 07/12/16 1826 07/13/16 0617 07/14/16 0743  NA 132* 133* 135  K 3.0* 4.0 3.6  CL 93* 101 99*  CO2 28 27 25   GLUCOSE 135* 100* 71  BUN <5* <5* 8  CREATININE  1.07 1.02 1.17  CALCIUM 8.7* 8.1* 8.3*  MG 1.8 1.6*  --   PHOS 3.5  --   --    GFR: Estimated Creatinine Clearance: 57.2 mL/min (by C-G formula based on SCr of 1.17 mg/dL). Liver Function Tests:  Recent Labs Lab 07/12/16 1826 07/13/16 0617 07/14/16 0743  AST 409* 281* 113*  ALT 151* 132* 96*  ALKPHOS 270* 245* 229*  BILITOT 6.6* 4.6* 2.9*  PROT 5.8* 5.1* 5.3*  ALBUMIN 2.6* 2.3* 2.2*    Recent Labs Lab 07/12/16 1826  LIPASE 88*   No results for input(s): AMMONIA in the last 168 hours. Coagulation Profile:  Recent Labs Lab 07/12/16 2115  INR 0.96   Cardiac Enzymes: No  results for input(s): CKTOTAL, CKMB, CKMBINDEX, TROPONINI in the last 168 hours. BNP (last 3 results) No results for input(s): PROBNP in the last 8760 hours. HbA1C: No results for input(s): HGBA1C in the last 72 hours. CBG:  Recent Labs Lab 07/13/16 0631 07/13/16 1647 07/14/16 0038 07/14/16 0808 07/14/16 0903  GLUCAP 109* 81 70 65 116*   Sepsis Labs:  Recent Labs Lab 07/12/16 2130  LATICACIDVEN 1.35    No results found for this or any previous visit (from the past 240 hour(s)).       Radiology Studies: US Abdomen Complete  Result Date: 07/13/2016 CLINICAL DATA:  Abdominal pain, epigastric pain EXAM: ABDOMEN ULTRASOUND COMPLETE COMPARISON:  None. FINDINGS: Gallbladder: Mildly abnormal gallbladder wall thickening at 4 mm. Sonographic Murphy's sign absent. Sludge is present in the gallbladder. Gallbladder slightly distended at 4 cm diameter. Common bile duct: Diameter: 6 mm Liver: Very minimally complex cystic lesion in the right hepatic lobe shown on image 39/8, subtle internal echoes, enhance through transmission. Coarse echogenic liver with poor sonic penetration compatible with diffuse hepatic steatosis. IVC: No abnormality visualized. Pancreas: Visualized portion unremarkable. Spleen: Not well visualized sonographically, but normal appearance on the CT scan from earlier today. Right Kidney: Length: 10.0 cm. Echogenicity within normal limits. No mass or hydronephrosis visualized. Left Kidney: Length: 10.2 cm. Echogenicity within normal limits. No mass or hydronephrosis visualized. Abdominal aorta: No aneurysm visualized. Other findings: None. IMPRESSION: 1. Thick-walled gallbladder, slightly distended, with internal sludge but no definite calculi. Sonographic Murphy's sign absent. Correlate clinically in assessing for cholecystitis. 2. Diffuse hepatic steatosis. 3. Small cyst in the liver, minimal complexity, likely benign. 4. Nonvisualization of the spleen due to difficulty  obtaining sonic window ; the spleen appeared normal on the earlier CT scan. Electronically Signed   By: Van Clines M.D.   On: 07/13/2016 10:55   Ct Abdomen Pelvis W Contrast  Result Date: 07/13/2016 CLINICAL DATA:  Acute onset of right upper quadrant abdominal pain, and bilateral lower quadrant abdominal pain. Nausea and vomiting. Initial encounter. EXAM: CT ABDOMEN AND PELVIS WITH CONTRAST TECHNIQUE: Multidetector CT imaging of the abdomen and pelvis was performed using the standard protocol following bolus administration of intravenous contrast. CONTRAST:  133mL ISOVUE-300 IOPAMIDOL (ISOVUE-300) INJECTION 61% COMPARISON:  None. FINDINGS: Lower chest: The visualized lung bases are grossly clear. The visualized portions of the mediastinum are unremarkable. Hepatobiliary: A 1.1 cm cyst is noted at the right hepatic lobe. The liver is otherwise unremarkable. Mild edema is noted at the gallbladder wall, with trace pericholecystic fluid, raising concern for mild acute cholecystitis. The gallbladder is mildly distended. The common bile duct remains normal in caliber. Pancreas: The pancreas is within normal limits. Spleen: The spleen is unremarkable in appearance. Adrenals/Urinary Tract: The adrenal glands are unremarkable in  appearance. The kidneys are within normal limits. There is no evidence of hydronephrosis. No renal or ureteral stones are identified. No perinephric stranding is seen. Stomach/Bowel: The stomach is unremarkable in appearance. The small bowel is within normal limits. The appendix is normal in caliber, without evidence of appendicitis. The colon is unremarkable in appearance. Trace fluid is seen tracking along the right paracolic gutter. Vascular/Lymphatic: Diffuse calcification is noted along the common iliac arteries bilaterally, with mild calcification noted along the abdominal aorta. No retroperitoneal or pelvic sidewall lymphadenopathy is seen. Reproductive: The prostate is borderline  prominent. The bladder is mildly distended and grossly unremarkable. Other: A small amount of free fluid is seen within the pelvis, of uncertain significance. Musculoskeletal: No acute osseous abnormalities are identified. The visualized musculature is unremarkable in appearance. IMPRESSION: 1. Mild edema at the gallbladder wall, with trace pericholecystic fluid, raising concern for mild acute cholecystitis. Gallbladder mildly distended in appearance. 2. Trace fluid tracking along the right paracolic gutter, and small amount of free fluid in the pelvis. This may reflect the gallbladder process. 3. 1.1 cm hepatic cyst noted. 4. Scattered aortic atherosclerosis, with diffuse calcification along the common iliac arteries bilaterally. Electronically Signed   By: Garald Balding M.D.   On: 07/13/2016 01:32   Mr 3d Recon At Scanner  Result Date: 07/13/2016 CLINICAL DATA:  Elevated liver function tests. Epigastric pain. Alcohol abuse. EXAM: MRI ABDOMEN WITHOUT AND WITH CONTRAST (INCLUDING MRCP) TECHNIQUE: Multiplanar multisequence MR imaging of the abdomen was performed both before and after the administration of intravenous contrast. Heavily T2-weighted images of the biliary and pancreatic ducts were obtained, and three-dimensional MRCP images were rendered by post processing. CONTRAST:  90mL MULTIHANCE GADOBENATE DIMEGLUMINE 529 MG/ML IV SOLN COMPARISON:  Ultrasound 07/13/2016.  CT 07/13/2016. FINDINGS: Portions of exam are mildly motion degraded. Lower chest: Normal heart size without pericardial or pleural effusion. Hepatobiliary: T2 hypo intensity within the liver with relatively similar signal on in and out of phase imaging, suggesting a combination of iron deposition and steatosis. A hepatic dome 9 mm T2 hyperintense hypervascular lesion on image 25/ series 1302 is consistent with a hemangioma. There is also a right hepatic lobe cyst. No cirrhosis. The gallbladder is mildly distended with mild wall thickening. No  stones identified. Mild gallbladder wall hyper enhancement. No biliary duct dilatation. No evidence of choledocholithiasis. Common duct 5 mm. Pancreas:  Normal, without mass or ductal dilatation. Spleen:  Iron deposition in the spleen. Adrenals/Urinary Tract: Normal adrenal glands. Normal kidneys, without hydronephrosis. Stomach/Bowel: Proximal gastric underdistention. Large and small bowel loops unremarkable. Vascular/Lymphatic: Aortic atherosclerosis. No retroperitoneal or retrocrural adenopathy. Other:  No ascites. Musculoskeletal: No acute osseous abnormality. IMPRESSION: 1. Gallbladder wall thickening and mild mucosal hyperenhancement. No gallstones. Given absence of sonographic Murphy's sign on ultrasound, acalculus cholecystitis is felt unlikely but cannot be entirely excluded. 2. 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 No cirrhosis. 3. Hepatic dome hemangioma.  No suspicious liver lesion. Electronically Signed   By: Abigail Miyamoto M.D.   On: 07/13/2016 21:27   Mr Jeananne Rama W/wo Cm/mrcp  Result Date: 07/13/2016 CLINICAL DATA:  Elevated liver function tests. Epigastric pain. Alcohol abuse. EXAM: MRI ABDOMEN WITHOUT AND WITH CONTRAST (INCLUDING MRCP) TECHNIQUE: Multiplanar multisequence MR imaging of the abdomen was performed both before and after the administration of intravenous contrast. Heavily T2-weighted images of the biliary and pancreatic ducts were obtained, and three-dimensional MRCP images were rendered by post processing. CONTRAST:  70mL MULTIHANCE GADOBENATE DIMEGLUMINE 529 MG/ML IV SOLN COMPARISON:  Ultrasound 07/13/2016.  CT 07/13/2016. FINDINGS: Portions of exam are mildly motion degraded. Lower chest: Normal heart size without pericardial or pleural effusion. Hepatobiliary: T2 hypo intensity within the liver with relatively similar signal on in and out of phase imaging, suggesting a combination of iron deposition and steatosis. A hepatic dome 9 mm T2 hyperintense hypervascular  lesion on image 25/ series 1302 is consistent with a hemangioma. There is also a right hepatic lobe cyst. No cirrhosis. The gallbladder is mildly distended with mild wall thickening. No stones identified. Mild gallbladder wall hyper enhancement. No biliary duct dilatation. No evidence of choledocholithiasis. Common duct 5 mm. Pancreas:  Normal, without mass or ductal dilatation. Spleen:  Iron deposition in the spleen. Adrenals/Urinary Tract: Normal adrenal glands. Normal kidneys, without hydronephrosis. Stomach/Bowel: Proximal gastric underdistention. Large and small bowel loops unremarkable. Vascular/Lymphatic: Aortic atherosclerosis. No retroperitoneal or retrocrural adenopathy. Other:  No ascites. Musculoskeletal: No acute osseous abnormality. IMPRESSION: 1. Gallbladder wall thickening and mild mucosal hyperenhancement. No gallstones. Given absence of sonographic Murphy's sign on ultrasound, acalculus cholecystitis is felt unlikely but cannot be entirely excluded. 2. 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 No cirrhosis. 3. Hepatic dome hemangioma.  No suspicious liver lesion. Electronically Signed   By: Abigail Miyamoto M.D.   On: 07/13/2016 21:27        Scheduled Meds: . folic acid  1 mg Oral Daily  . LORazepam  0-4 mg Intravenous Q6H   Followed by  . [START ON 07/15/2016] LORazepam  0-4 mg Intravenous Q12H  . LORazepam  0.5 mg Intravenous Once  . mouth rinse  15 mL Mouth Rinse BID  . multivitamin with minerals  1 tablet Oral Daily  . thiamine  100 mg Oral Daily   Or  . thiamine  100 mg Intravenous Daily   Continuous Infusions:     LOS: 1 day     Cordelia Poche [facility] 07/14/2016, 2:50 PM Pager: [phone]  If 7PM-7AM, please contact night-coverage [email or web address] Password [email or web address] 07/14/2016, 2:50 PM

## 2016-07-14 NOTE — Progress Notes (Signed)
Hypoglycemic Event  CBG: 65  Treatment: 15 GM carbohydrate snack  Symptoms: None  Follow-up CBG: Time:0908 CBG Result:116  Possible Reasons for Event: Inadequate meal intake  Comments/MD notified:pt was on npo diet d/t abdominal pain. Pt diet increased. Pt given orange juice and cbg up to 116. Will continue to monitor pt.     Jeff Romero S

## 2016-07-15 DIAGNOSIS — K759 Inflammatory liver disease, unspecified: Secondary | ICD-10-CM

## 2016-07-15 LAB — COMPREHENSIVE METABOLIC PANEL
ALBUMIN: 2.2 g/dL — AB (ref 3.5–5.0)
ALK PHOS: 187 U/L — AB (ref 38–126)
ALT: 79 U/L — AB (ref 17–63)
AST: 79 U/L — ABNORMAL HIGH (ref 15–41)
Anion gap: 8 (ref 5–15)
BUN: 6 mg/dL (ref 6–20)
CALCIUM: 8.4 mg/dL — AB (ref 8.9–10.3)
CO2: 28 mmol/L (ref 22–32)
CREATININE: 0.88 mg/dL (ref 0.61–1.24)
Chloride: 98 mmol/L — ABNORMAL LOW (ref 101–111)
GFR calc Af Amer: >60 mL/min (ref >60)
GFR calc non Af Amer: >60 mL/min (ref >60)
GLUCOSE: 97 mg/dL (ref 65–99)
Potassium: 3.7 mmol/L (ref 3.5–5.1)
SODIUM: 134 mmol/L — AB (ref 135–145)
Total Bilirubin: 1.2 mg/dL (ref 0.3–1.2)
Total Protein: 5.4 g/dL — ABNORMAL LOW (ref 6.5–8.1)

## 2016-07-15 LAB — CBC
HEMATOCRIT: 30.6 % — AB (ref 39.0–52.0)
HEMOGLOBIN: 10.3 g/dL — AB (ref 13.0–17.0)
MCH: 31.2 pg (ref 26.0–34.0)
MCHC: 33.7 g/dL (ref 30.0–36.0)
MCV: 92.7 fL (ref 78.0–100.0)
Platelets: 125 10*3/uL — ABNORMAL LOW (ref 150–400)
RBC: 3.3 MIL/uL — ABNORMAL LOW (ref 4.22–5.81)
RDW: 16.9 % — ABNORMAL HIGH (ref 11.5–15.5)
WBC: 5.5 10*3/uL (ref 4.0–10.5)

## 2016-07-15 LAB — GLUCOSE, CAPILLARY
Glucose-Capillary: 112 mg/dL — ABNORMAL HIGH (ref 65–99)
Glucose-Capillary: 118 mg/dL — ABNORMAL HIGH (ref 65–99)

## 2016-07-15 LAB — MAGNESIUM: Magnesium: 1.8 mg/dL (ref 1.7–2.4)

## 2016-07-15 NOTE — Discharge Summary (Signed)
Physician Discharge Summary  Jeff Romero [id number] DOB: 1961/05/27 DOA: 07/12/2016  PCP: No PCP Per Patient  Admit date: 07/12/2016 Discharge date: 07/15/2016  Admitted From: Home Disposition: Home   Recommendations for Outpatient Follow-up:  1. Follow up with PCP in 1-2 weeks 2. Alcohol cessation counseling.   Home Health: None Equipment/Devices: None Discharge Condition: Stable CODE STATUS: Full Diet recommendation: Regular  Brief/Interim Summary: Pedram Castor is a 55 y.o. male with a history of alcohol abuse. He presented with nausea, vomiting and epigastric pain and was found to have possible cholecystitis. He had associated elevations in AST/ALT and alkaline phosphatase with elevated bilirubin (indirect and direct). Surgery was consulted. He remained afebrile without leukocytosis and LFTs trended downward. No gallstones were noted on any imaging. MRI showed no CBD stone. He improved clinically and was discharged after observation with strong recommendation to quit drinking alcohol. He was set up to establish care at [facility] and [facility].    Discharge Diagnoses:  Principal Problem:   Abdominal pain Active Problems:   Transaminitis   Nausea & vomiting   Alcohol abuse   Malnutrition of moderate degree   Discharge Instructions Discharge Instructions    Diet - low sodium heart healthy    Complete by:  As directed    Discharge instructions    Complete by:  As directed    The cause of your abdominal pain was not determined with certainty, though it has improved. You showed evidence of liver enzyme elevations that may mean damage to your liver, but these have improved.  - STOP drinking alcohol, as this affects your liver - STOP eating Jalapenos  - Call [facility] to establish primary care - If your symptoms return, seek medical care.   Increase activity slowly    Complete by:  As directed        Medication List    TAKE these medications    calcium carbonate 500 MG chewable tablet Commonly known as:  TUMS - dosed in mg elemental calcium Chew 1 tablet by mouth daily as needed for indigestion or heartburn.   famotidine 20 MG tablet Commonly known as:  PEPCID Take 20 mg by mouth daily as needed for heartburn or indigestion.      Follow-up Information    [place]. Schedule an appointment as soon as possible for a visit today.   Contact information: [place] [phone]         No Known Allergies  Consultations:  General surgery, Dr. Redmond Pulling  Procedures/Studies: US Abdomen Complete  Result Date: 07/13/2016 CLINICAL DATA:  Abdominal pain, epigastric pain EXAM: ABDOMEN ULTRASOUND COMPLETE COMPARISON:  None. FINDINGS: Gallbladder: Mildly abnormal gallbladder wall thickening at 4 mm. Sonographic Murphy's sign absent. Sludge is present in the gallbladder. Gallbladder slightly distended at 4 cm diameter. Common bile duct: Diameter: 6 mm Liver: Very minimally complex cystic lesion in the right hepatic lobe shown on image 39/8, subtle internal echoes, enhance through transmission. Coarse echogenic liver with poor sonic penetration compatible with diffuse hepatic steatosis. IVC: No abnormality visualized. Pancreas: Visualized portion unremarkable. Spleen: Not well visualized sonographically, but normal appearance on the CT scan from earlier today. Right Kidney: Length: 10.0 cm. Echogenicity within normal limits. No mass or hydronephrosis visualized. Left Kidney: Length: 10.2 cm. Echogenicity within normal limits. No mass or hydronephrosis visualized. Abdominal aorta: No aneurysm visualized. Other findings: None. IMPRESSION: 1. Thick-walled gallbladder, slightly distended, with internal sludge but  no definite calculi. Sonographic Murphy's sign absent. Correlate clinically in assessing for cholecystitis. 2. Diffuse hepatic steatosis. 3. Small cyst in the  liver, minimal complexity, likely benign. 4. Nonvisualization of the spleen due to difficulty obtaining sonic window ; the spleen appeared normal on the earlier CT scan. Electronically Signed   By: Van Clines M.D.   On: 07/13/2016 10:55   Ct Abdomen Pelvis W Contrast  Result Date: 07/13/2016 CLINICAL DATA:  Acute onset of right upper quadrant abdominal pain, and bilateral lower quadrant abdominal pain. Nausea and vomiting. Initial encounter. EXAM: CT ABDOMEN AND PELVIS WITH CONTRAST TECHNIQUE: Multidetector CT imaging of the abdomen and pelvis was performed using the standard protocol following bolus administration of intravenous contrast. CONTRAST:  127mL ISOVUE-300 IOPAMIDOL (ISOVUE-300) INJECTION 61% COMPARISON:  None. FINDINGS: Lower chest: The visualized lung bases are grossly clear. The visualized portions of the mediastinum are unremarkable. Hepatobiliary: A 1.1 cm cyst is noted at the right hepatic lobe. The liver is otherwise unremarkable. Mild edema is noted at the gallbladder wall, with trace pericholecystic fluid, raising concern for mild acute cholecystitis. The gallbladder is mildly distended. The common bile duct remains normal in caliber. Pancreas: The pancreas is within normal limits. Spleen: The spleen is unremarkable in appearance. Adrenals/Urinary Tract: The adrenal glands are unremarkable in appearance. The kidneys are within normal limits. There is no evidence of hydronephrosis. No renal or ureteral stones are identified. No perinephric stranding is seen. Stomach/Bowel: The stomach is unremarkable in appearance. The small bowel is within normal limits. The appendix is normal in caliber, without evidence of appendicitis. The colon is unremarkable in appearance. Trace fluid is seen tracking along the right paracolic gutter. Vascular/Lymphatic: Diffuse calcification is noted along the common iliac arteries bilaterally, with mild calcification noted along the abdominal aorta. No  retroperitoneal or pelvic sidewall lymphadenopathy is seen. Reproductive: The prostate is borderline prominent. The bladder is mildly distended and grossly unremarkable. Other: A small amount of free fluid is seen within the pelvis, of uncertain significance. Musculoskeletal: No acute osseous abnormalities are identified. The visualized musculature is unremarkable in appearance. IMPRESSION: 1. Mild edema at the gallbladder wall, with trace pericholecystic fluid, raising concern for mild acute cholecystitis. Gallbladder mildly distended in appearance. 2. Trace fluid tracking along the right paracolic gutter, and small amount of free fluid in the pelvis. This may reflect the gallbladder process. 3. 1.1 cm hepatic cyst noted. 4. Scattered aortic atherosclerosis, with diffuse calcification along the common iliac arteries bilaterally. Electronically Signed   By: Garald Balding M.D.   On: 07/13/2016 01:32   Mr 3d Recon At Scanner  Result Date: 07/13/2016 CLINICAL DATA:  Elevated liver function tests. Epigastric pain. Alcohol abuse. EXAM: MRI ABDOMEN WITHOUT AND WITH CONTRAST (INCLUDING MRCP) TECHNIQUE: Multiplanar multisequence MR imaging of the abdomen was performed both before and after the administration of intravenous contrast. Heavily T2-weighted images of the biliary and pancreatic ducts were obtained, and three-dimensional MRCP images were rendered by post processing. CONTRAST:  71mL MULTIHANCE GADOBENATE DIMEGLUMINE 529 MG/ML IV SOLN COMPARISON:  Ultrasound 07/13/2016.  CT 07/13/2016. FINDINGS: Portions of exam are mildly motion degraded. Lower chest: Normal heart size without pericardial or pleural effusion. Hepatobiliary: T2 hypo intensity within the liver with relatively similar signal on in and out of phase imaging, suggesting a combination of iron deposition and steatosis. A hepatic dome 9 mm T2 hyperintense hypervascular lesion on image 25/ series 1302 is consistent with a hemangioma. There is also a  right hepatic lobe cyst. No cirrhosis.  The gallbladder is mildly distended with mild wall thickening. No stones identified. Mild gallbladder wall hyper enhancement. No biliary duct dilatation. No evidence of choledocholithiasis. Common duct 5 mm. Pancreas:  Normal, without mass or ductal dilatation. Spleen:  Iron deposition in the spleen. Adrenals/Urinary Tract: Normal adrenal glands. Normal kidneys, without hydronephrosis. Stomach/Bowel: Proximal gastric underdistention. Large and small bowel loops unremarkable. Vascular/Lymphatic: Aortic atherosclerosis. No retroperitoneal or retrocrural adenopathy. Other:  No ascites. Musculoskeletal: No acute osseous abnormality. IMPRESSION: 1. Gallbladder wall thickening and mild mucosal hyperenhancement. No gallstones. Given absence of sonographic Murphy's sign on ultrasound, acalculus cholecystitis is felt unlikely but cannot be entirely excluded. 2. 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 No cirrhosis. 3. Hepatic dome hemangioma.  No suspicious liver lesion. Electronically Signed   By: Abigail Miyamoto M.D.   On: 07/13/2016 21:27   Mr Jeananne Rama W/wo Cm/mrcp  Result Date: 07/13/2016 CLINICAL DATA:  Elevated liver function tests. Epigastric pain. Alcohol abuse. EXAM: MRI ABDOMEN WITHOUT AND WITH CONTRAST (INCLUDING MRCP) TECHNIQUE: Multiplanar multisequence MR imaging of the abdomen was performed both before and after the administration of intravenous contrast. Heavily T2-weighted images of the biliary and pancreatic ducts were obtained, and three-dimensional MRCP images were rendered by post processing. CONTRAST:  65mL MULTIHANCE GADOBENATE DIMEGLUMINE 529 MG/ML IV SOLN COMPARISON:  Ultrasound 07/13/2016.  CT 07/13/2016. FINDINGS: Portions of exam are mildly motion degraded. Lower chest: Normal heart size without pericardial or pleural effusion. Hepatobiliary: T2 hypo intensity within the liver with relatively similar signal on in and out of phase imaging,  suggesting a combination of iron deposition and steatosis. A hepatic dome 9 mm T2 hyperintense hypervascular lesion on image 25/ series 1302 is consistent with a hemangioma. There is also a right hepatic lobe cyst. No cirrhosis. The gallbladder is mildly distended with mild wall thickening. No stones identified. Mild gallbladder wall hyper enhancement. No biliary duct dilatation. No evidence of choledocholithiasis. Common duct 5 mm. Pancreas:  Normal, without mass or ductal dilatation. Spleen:  Iron deposition in the spleen. Adrenals/Urinary Tract: Normal adrenal glands. Normal kidneys, without hydronephrosis. Stomach/Bowel: Proximal gastric underdistention. Large and small bowel loops unremarkable. Vascular/Lymphatic: Aortic atherosclerosis. No retroperitoneal or retrocrural adenopathy. Other:  No ascites. Musculoskeletal: No acute osseous abnormality. IMPRESSION: 1. Gallbladder wall thickening and mild mucosal hyperenhancement. No gallstones. Given absence of sonographic Murphy's sign on ultrasound, acalculus cholecystitis is felt unlikely but cannot be entirely excluded. 2. 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 No cirrhosis. 3. Hepatic dome hemangioma.  No suspicious liver lesion. Electronically Signed   By: Abigail Miyamoto M.D.   On: 07/13/2016 21:27     Subjective: Pt feels well. No abd pain, N/V/D. Eating well. Wants to leave.   Discharge Exam: Vitals:   07/15/16 0100 07/15/16 0500  BP: 126/74 131/76  Pulse: 92 89  Resp: 18 19  Temp: 98.4 F (36.9 C) 98.1 F (36.7 C)   Vitals:   07/14/16 1506 07/14/16 2124 07/15/16 0100 07/15/16 0500  BP: (!) 104/59 116/70 126/74 131/76  Pulse: 92 91 92 89  Resp:  18 18 19   Temp: 98.2 F (36.8 C) 98.1 F (36.7 C) 98.4 F (36.9 C) 98.1 F (36.7 C)  TempSrc: Oral Oral Oral Oral  SpO2: 99% 100% 99% 98%  Weight:      Height:       General: Pt is alert, awake, not in acute distress Cardiovascular: RRR, S1/S2 +, no rubs, no  gallops Respiratory: CTA bilaterally, no wheezing, no rhonchi Abdominal: Soft, NT,  ND, bowel sounds + Extremities: no edema, no cyanosis  The results of significant diagnostics from this hospitalization (including imaging, microbiology, ancillary and laboratory) are listed below for reference.    Microbiology: No results found for this or any previous visit (from the past 240 hour(s)).   Labs: BNP (last 3 results) No results for input(s): BNP in the last 8760 hours. Basic Metabolic Panel:  Recent Labs Lab 07/12/16 1826 07/13/16 0617 07/14/16 0743 07/15/16 0523  NA 132* 133* 135 134*  K 3.0* 4.0 3.6 3.7  CL 93* 101 99* 98*  CO2 28 27 25 28   GLUCOSE 135* 100* 71 97  BUN <5* <5* 8 6  CREATININE 1.07 1.02 1.17 0.88  CALCIUM 8.7* 8.1* 8.3* 8.4*  MG 1.8 1.6*  --  1.8  PHOS 3.5  --   --   --    Liver Function Tests:  Recent Labs Lab 07/12/16 1826 07/13/16 0617 07/14/16 0743 07/15/16 0523  AST 409* 281* 113* 79*  ALT 151* 132* 96* 79*  ALKPHOS 270* 245* 229* 187*  BILITOT 6.6* 4.6* 2.9* 1.2  PROT 5.8* 5.1* 5.3* 5.4*  ALBUMIN 2.6* 2.3* 2.2* 2.2*    Recent Labs Lab 07/12/16 1826  LIPASE 88*   No results for input(s): AMMONIA in the last 168 hours. CBC:  Recent Labs Lab 07/12/16 1826 07/13/16 0617 07/14/16 0743 07/15/16 0523  WBC 10.8* 5.8 6.3 5.5  NEUTROABS  --  4.2  --   --   HGB 13.2 10.6* 10.8* 10.3*  HCT 38.6* 30.8* 32.4* 30.6*  MCV 91.7 92.5 94.2 92.7  PLT 134* 111* 115* 125*   Cardiac Enzymes: No results for input(s): CKTOTAL, CKMB, CKMBINDEX, TROPONINI in the last 168 hours. BNP: Invalid input(s): POCBNP CBG:  Recent Labs Lab 07/14/16 0808 07/14/16 0903 07/14/16 1811 07/15/16 0049 07/15/16 0808  GLUCAP 65 116* 120* 118* 112*   D-Dimer No results for input(s): DDIMER in the last 72 hours. Hgb A1c No results for input(s): HGBA1C in the last 72 hours. Lipid Profile No results for input(s): CHOL, HDL, LDLCALC, TRIG, CHOLHDL, LDLDIRECT  in the last 72 hours. Thyroid function studies No results for input(s): TSH, T4TOTAL, T3FREE, THYROIDAB in the last 72 hours.  Invalid input(s): FREET3 Anemia work up No results for input(s): VITAMINB12, FOLATE, FERRITIN, TIBC, IRON, RETICCTPCT in the last 72 hours. Urinalysis    Component Value Date/Time   COLORURINE BROWN (A) 07/12/2016 1833   APPEARANCEUR CLOUDY (A) 07/12/2016 1833   LABSPEC 1.010 07/12/2016 1833   PHURINE >9.0 (H) 07/12/2016 1833   GLUCOSEU 100 (A) 07/12/2016 1833   HGBUR TRACE (A) 07/12/2016 1833   BILIRUBINUR LARGE (A) 07/12/2016 1833   KETONESUR 15 (A) 07/12/2016 1833   PROTEINUR 100 (A) 07/12/2016 1833   NITRITE POSITIVE (A) 07/12/2016 1833   LEUKOCYTESUR MODERATE (A) 07/12/2016 1833   Sepsis Labs Invalid input(s): PROCALCITONIN,  WBC,  LACTICIDVEN Microbiology No results found for this or any previous visit (from the past 240 hour(s)).  Time coordinating discharge: Over 30 minutes  Vance Gather, MD  [facility] 07/15/2016, 12:43 PM Pager [phone]  If 7PM-7AM, please contact night-coverage [email or web address] Password [email or web address]

## 2016-07-15 NOTE — Progress Notes (Signed)
Discharge Note:  Patient alert and oriented X 4 and in no distress. He and his wife were given discharge instructions regarding signs and symptoms to report, medication, diet, activity and upcoming appointments. He verbalized understanding of all instructions.  Peripheral IV and telemetry discontinued. Patient confirmed that he had all of his personal belongings.  He was transported out via wheelchair by this RN.

## 2016-07-16 DIAGNOSIS — K759 Inflammatory liver disease, unspecified: Secondary | ICD-10-CM

## 2016-07-16 DIAGNOSIS — R1013 Epigastric pain: Secondary | ICD-10-CM

## 2017-03-09 ENCOUNTER — Encounter: Payer: Self-pay | Admitting: Gastroenterology

## 2017-03-19 IMAGING — CT CT ABD-PELV W/ CM
2 of 5 series · 15 of 46 positions shown, 17 images · IV contrast (iopamidol)
Comparison: None.

CLINICAL DATA: Acute onset of right upper quadrant abdominal pain,
and bilateral lower quadrant abdominal pain. Nausea and vomiting.
Initial encounter.

EXAM:
CT ABDOMEN AND PELVIS WITH CONTRAST
TECHNIQUE: Multidetector CT imaging of the abdomen and pelvis was performed
using the standard protocol following bolus administration of
intravenous contrast.
CONTRAST:  100mL PAWMMK-TKK IOPAMIDOL (PAWMMK-TKK) INJECTION 61%

[Series 2: abd/ pelvis 5.0 i30f 1 · axial · 0.61mm/px · z∈[+1109,+1499]mm · 12 of 88 slices shown, 14 images]
[im 5/88  soft-tissue]
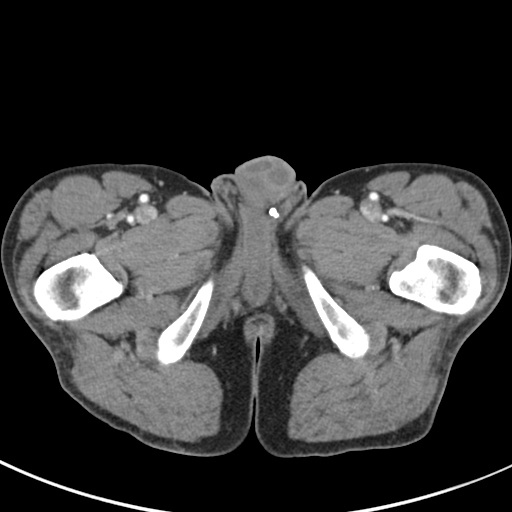
[im 5/88  bone]
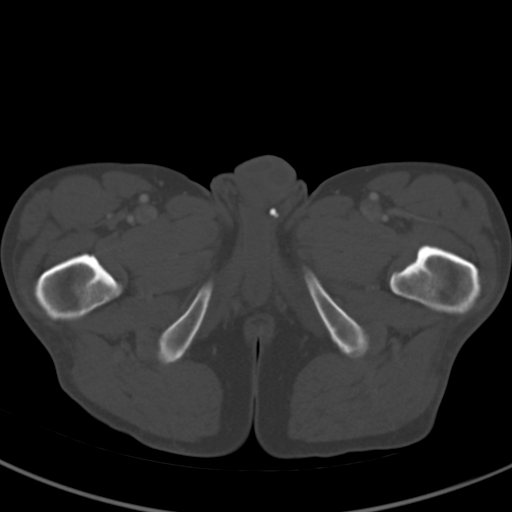
[im 14/88  soft-tissue]
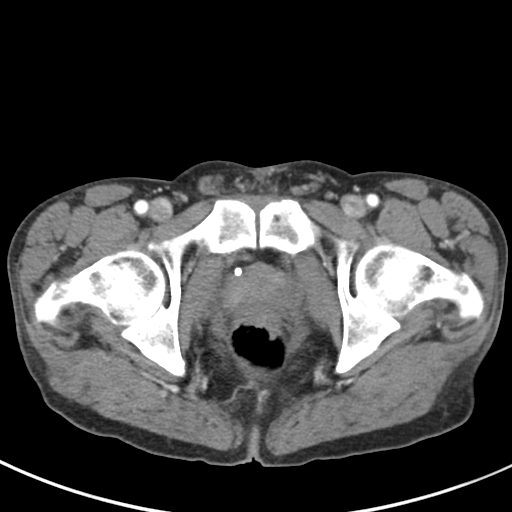
[im 18/88  soft-tissue]
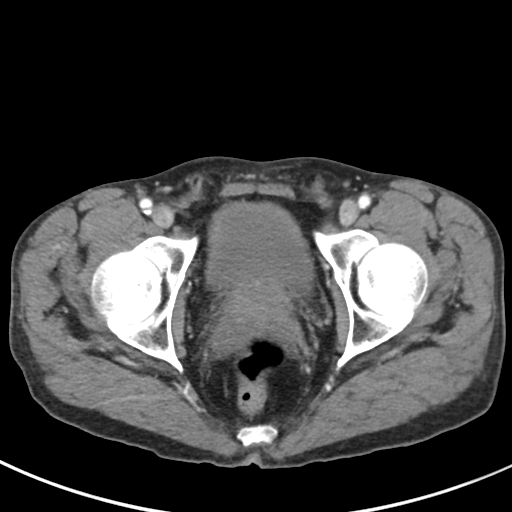
[im 27/88  soft-tissue]
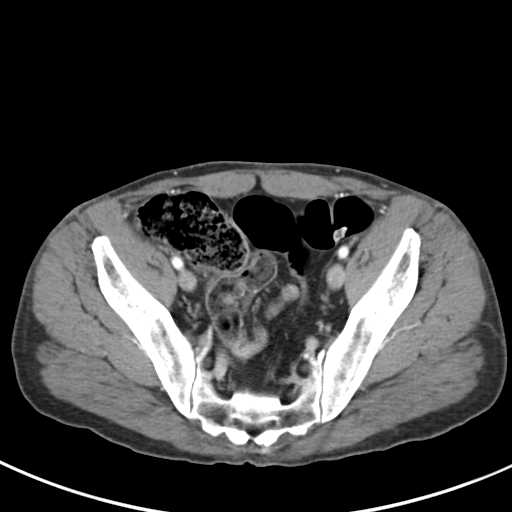
[im 35/88  soft-tissue]
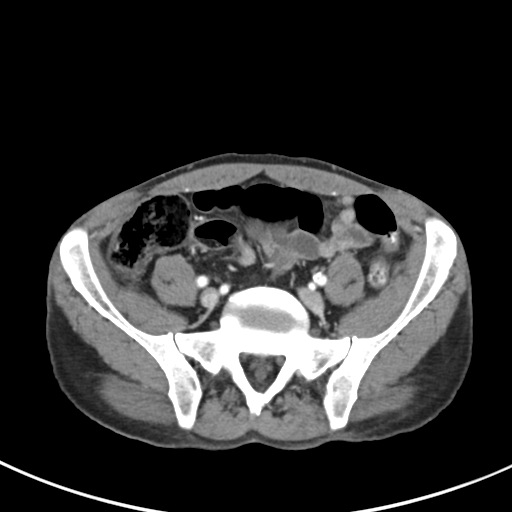
[im 40/88  soft-tissue]
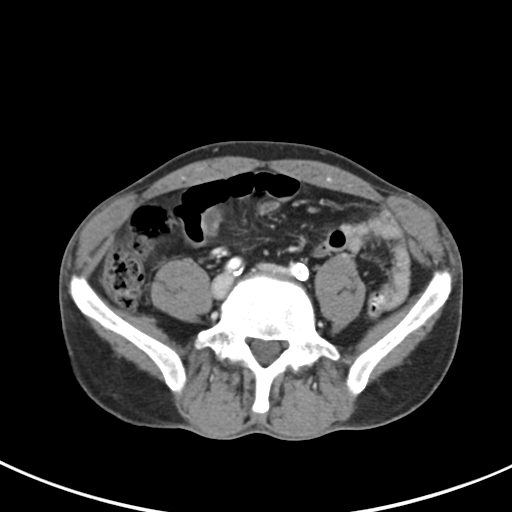
[im 48/88  soft-tissue]
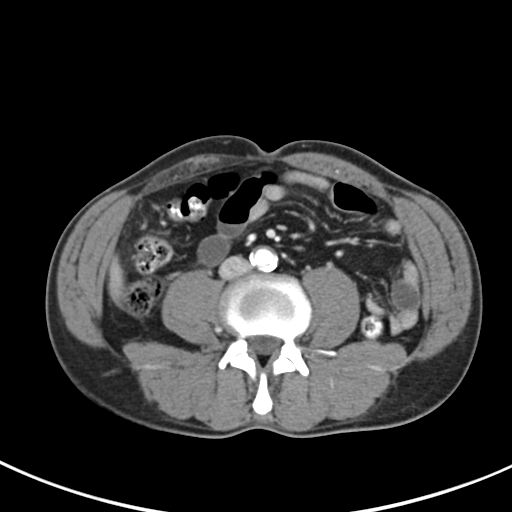
[im 53/88  soft-tissue]
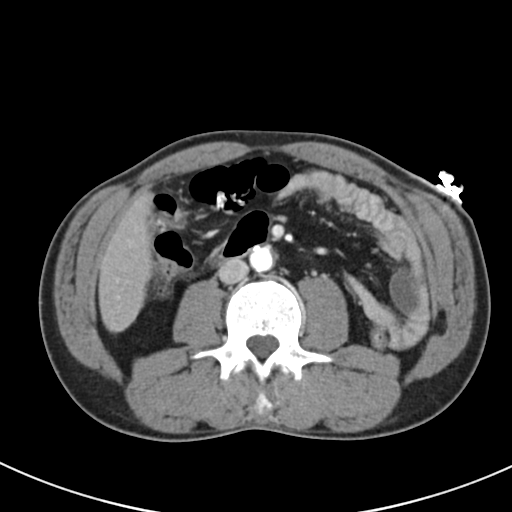
[im 61/88  soft-tissue]
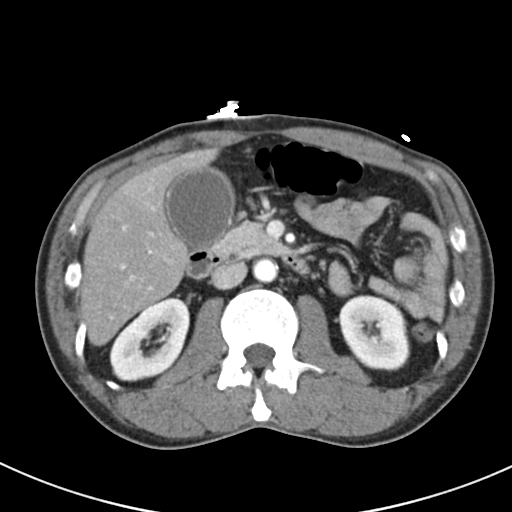
[im 61/88  bone]
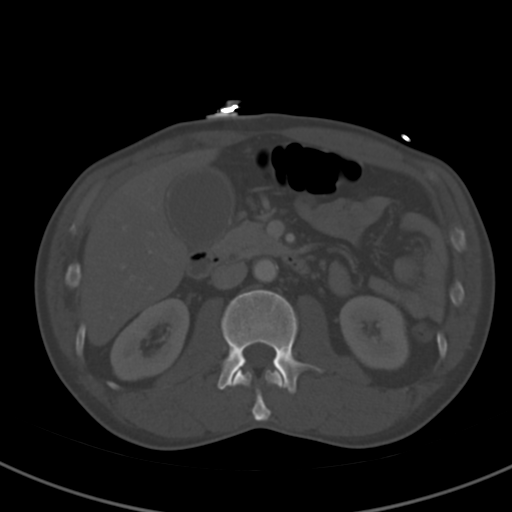
[im 70/88  soft-tissue]
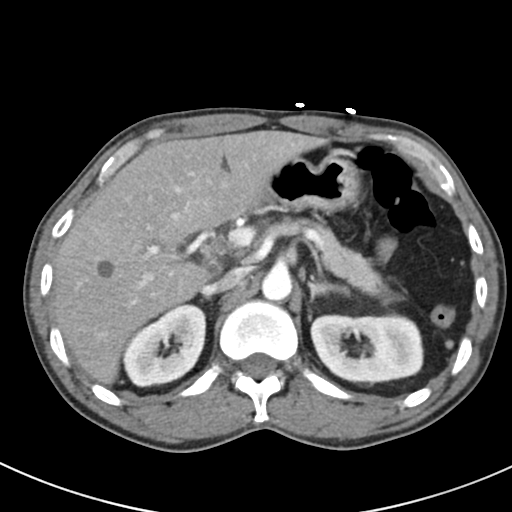
[im 74/88  soft-tissue]
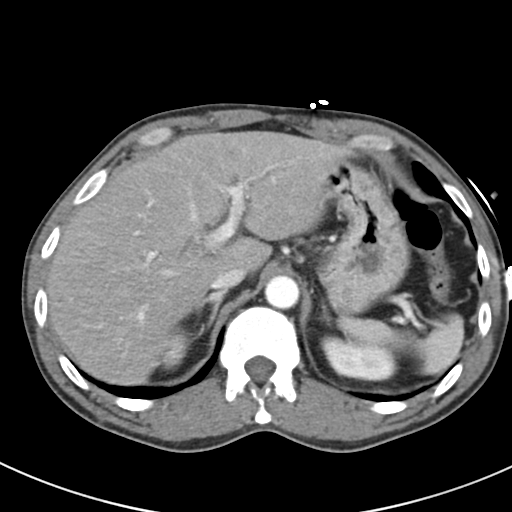
[im 83/88  soft-tissue]
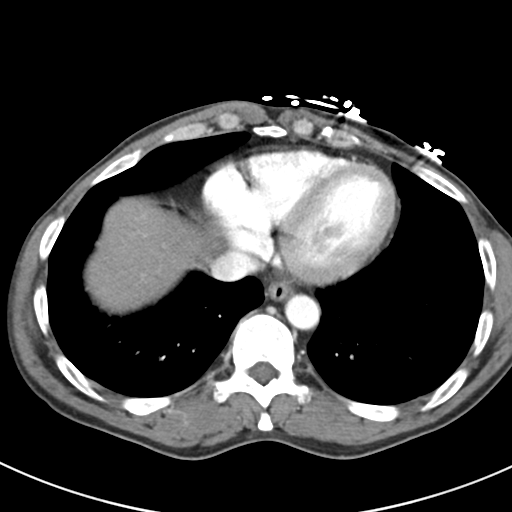

[Series 5: coronal soft tissue · coronal · 0.64mm/px · 3 of 72 slices shown]
[im 24/72  soft-tissue]
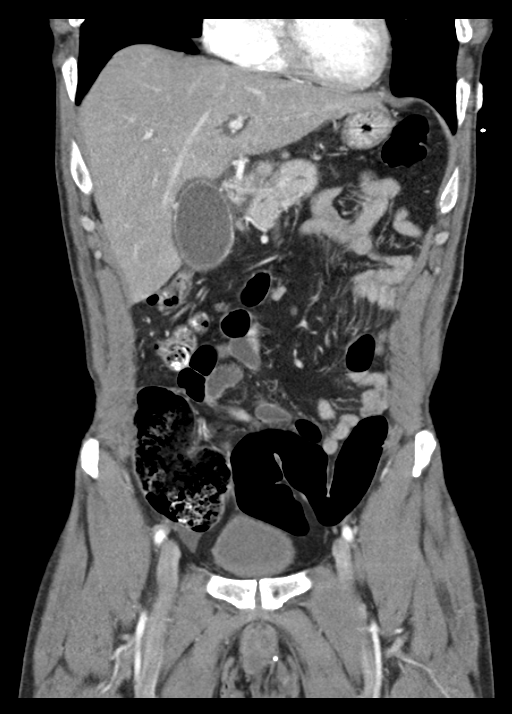
[im 32/72  soft-tissue]
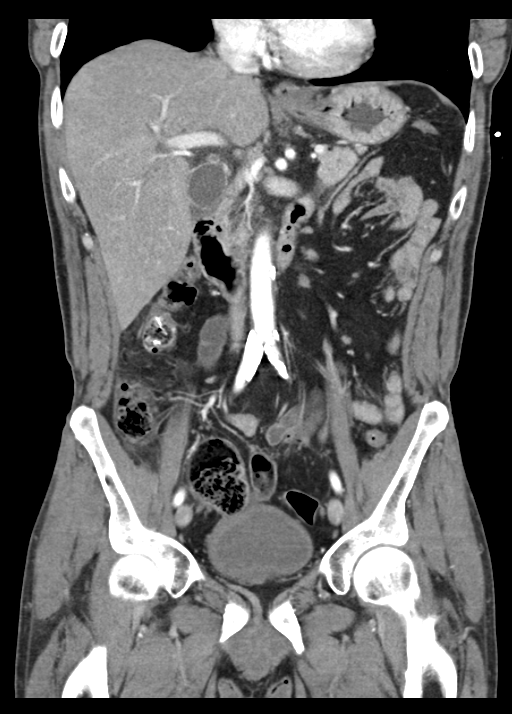
[im 40/72  soft-tissue]
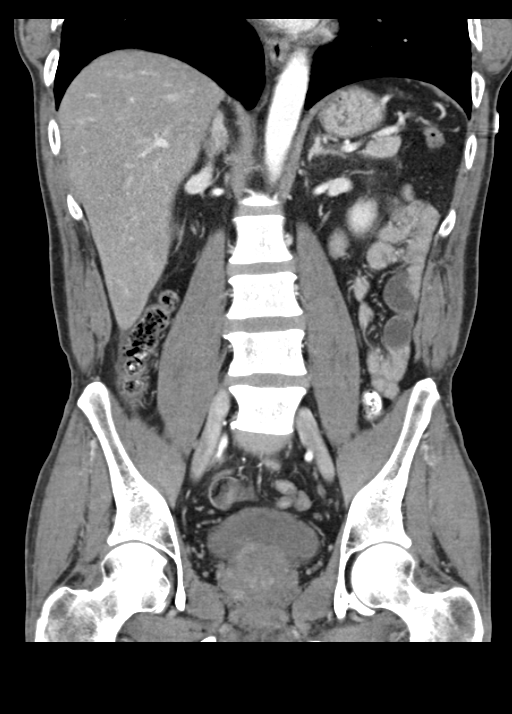

[15 of 46 positions shown; findings below may reference images not displayed]

FINDINGS: Lower chest: The visualized lung bases are grossly clear. The
visualized portions of the mediastinum are unremarkable.

Hepatobiliary: A 1.1 cm cyst is noted at the right hepatic lobe. The
liver is otherwise unremarkable. Mild edema is noted at the
gallbladder wall, with trace pericholecystic fluid, raising concern
for mild acute cholecystitis. The gallbladder is mildly distended.
The common bile duct remains normal in caliber.

Pancreas: The pancreas is within normal limits.

Spleen: The spleen is unremarkable in appearance.

Adrenals/Urinary Tract: The adrenal glands are unremarkable in
appearance. The kidneys are within normal limits. There is no
evidence of hydronephrosis. No renal or ureteral stones are
identified. No perinephric stranding is seen.

Stomach/Bowel: The stomach is unremarkable in appearance. The small
bowel is within normal limits. The appendix is normal in caliber,
without evidence of appendicitis. The colon is unremarkable in
appearance.

Trace fluid is seen tracking along the right paracolic gutter.

Vascular/Lymphatic: Diffuse calcification is noted along the common
iliac arteries bilaterally, with mild calcification noted along the
abdominal aorta. No retroperitoneal or pelvic sidewall
lymphadenopathy is seen.

Reproductive: The prostate is borderline prominent. The bladder is
mildly distended and grossly unremarkable.

Other: A small amount of free fluid is seen within the pelvis, of
uncertain significance.

Musculoskeletal: No acute osseous abnormalities are identified. The
visualized musculature is unremarkable in appearance.
IMPRESSION: 1. Mild edema at the gallbladder wall, with trace pericholecystic
fluid, raising concern for mild acute cholecystitis. Gallbladder
mildly distended in appearance.
2. Trace fluid tracking along the right paracolic gutter, and small
amount of free fluid in the pelvis. This may reflect the gallbladder
process.
3. 1.1 cm hepatic cyst noted.
4. Scattered aortic atherosclerosis, with diffuse calcification
along the common iliac arteries bilaterally.

## 2017-04-08 ENCOUNTER — Encounter: Payer: Self-pay | Admitting: Gastroenterology

## 2017-04-28 ENCOUNTER — Encounter: Payer: Self-pay | Admitting: Physician Assistant

## 2017-05-11 ENCOUNTER — Encounter: Payer: Self-pay | Admitting: Gastroenterology

## 2018-05-02 ENCOUNTER — Ambulatory Visit (AMBULATORY_SURGERY_CENTER): Payer: Self-pay

## 2018-05-02 VITALS — Ht 67.5 in | Wt 133.0 lb

## 2018-05-02 DIAGNOSIS — Z1211 Encounter for screening for malignant neoplasm of colon: Secondary | ICD-10-CM

## 2018-05-02 MED ORDER — PEG-KCL-NACL-NASULF-NA ASC-C 140 G PO SOLR: 1.0000 | Freq: Once | ORAL | 0 refills | Status: AC

## 2018-05-02 NOTE — Progress Notes (Signed)
Denies allergies to eggs or soy products. Denies complication of anesthesia or sedation. Denies use of weight loss medication. Denies use of O2.   Emmi instructions declined.  

## 2018-05-20 ENCOUNTER — Encounter: Payer: Self-pay | Admitting: Gastroenterology

## 2018-06-01 ENCOUNTER — Encounter: Payer: Self-pay | Admitting: Gastroenterology

## 2018-07-12 ENCOUNTER — Ambulatory Visit (AMBULATORY_SURGERY_CENTER): Payer: BC Managed Care – PPO | Admitting: Gastroenterology

## 2018-07-12 ENCOUNTER — Encounter: Payer: Self-pay | Admitting: Gastroenterology

## 2018-07-12 VITALS — BP 154/71 | HR 96 | Temp 98.9°F | Resp 16 | Ht 67.5 in | Wt 133.0 lb

## 2018-07-12 DIAGNOSIS — Z1211 Encounter for screening for malignant neoplasm of colon: Secondary | ICD-10-CM

## 2018-07-12 MED ORDER — SODIUM CHLORIDE 0.9 % IV SOLN: 500.0000 mL | Freq: Once | INTRAVENOUS | Status: DC

## 2018-07-12 NOTE — Progress Notes (Signed)
A/ox3 pleased with MAC, report to RN 

## 2018-07-12 NOTE — Op Note (Signed)
[place] Patient Name: Jeff Romero Procedure Date: 07/12/2018 10:48 AM MRN: [id number] Endoscopist: [place]. Loletha Carrow , MD Age: 57 Referring MD:  Date of Birth: [birth year] Gender: Male Account #: [id number] Procedure:                Colonoscopy Indications:              Screening for colorectal malignant neoplasm, This                            is the patient's first colonoscopy Medicines:                Monitored Anesthesia Care Procedure:                Pre-Anesthesia Assessment:                           - Prior to the procedure, a History and Physical                            was performed, and patient medications and                            allergies were reviewed. The patient's tolerance of                            previous anesthesia was also reviewed. The risks                            and benefits of the procedure and the sedation                            options and risks were discussed with the patient.                            All questions were answered, and informed consent                            was obtained. Prior Anticoagulants: The patient has                            taken no previous anticoagulant or antiplatelet                            agents. ASA Grade Assessment: II - A patient with                            mild systemic disease. After reviewing the risks                            and benefits, the patient was deemed in                            satisfactory condition to undergo the procedure.  After obtaining informed consent, the colonoscope                            was passed under direct vision. Throughout the                            procedure, the patient's blood pressure, pulse, and                            oxygen saturations were monitored continuously. The                            Colonoscope was introduced through the anus and                            advanced to the the cecum,  identified by                            appendiceal orifice and ileocecal valve. The                            colonoscopy was performed without difficulty. The                            patient tolerated the procedure well. The quality                            of the bowel preparation was good. The ileocecal                            valve, appendiceal orifice, and rectum were                            photographed. The quality of the bowel preparation                            was evaluated using the BBPS [facility] Bowel                            Preparation Scale) with scores of: Right Colon = 2,                            Transverse Colon = 3 and Left Colon = 2. The total                            BBPS score equals 7. Scope In: 11:00:33 AM Scope Out: 11:12:11 AM Scope Withdrawal Time: 0 hours 9 minutes 48 seconds  Total Procedure Duration: 0 hours 11 minutes 38 seconds  Findings:                 The perianal and digital rectal examinations were                            normal.  The entire examined colon appeared normal on direct                            and retroflexion views. Complications:            No immediate complications. Estimated Blood Loss:     Estimated blood loss: none. Impression:               - The entire examined colon is normal on direct and                            retroflexion views.                           - No specimens collected. Recommendation:           - Patient has a contact number available for                            emergencies. The signs and symptoms of potential                            delayed complications were discussed with the                            patient. Return to normal activities tomorrow.                            Written discharge instructions were provided to the                            patient.                           - Resume previous diet.                           - Continue  present medications.                           - Repeat colonoscopy in 10 years for screening                            purposes. Henry [name], MD 07/12/2018 11:14:34 AM This report has been signed electronically.

## 2018-07-12 NOTE — Patient Instructions (Signed)
Continue present medications. Resume previous diet.     YOU HAD AN ENDOSCOPIC PROCEDURE TODAY AT [facility]:   Refer to the procedure report that was given to you for any specific questions about what was found during the examination.  If the procedure report does not answer your questions, please call your gastroenterologist to clarify.  If you requested that your care partner not be given the details of your procedure findings, then the procedure report has been included in a sealed envelope for you to review at your convenience later.  YOU SHOULD EXPECT: Some feelings of bloating in the abdomen. Passage of more gas than usual.  Walking can help get rid of the air that was put into your GI tract during the procedure and reduce the bloating. If you had a lower endoscopy (such as a colonoscopy or flexible sigmoidoscopy) you may notice spotting of blood in your stool or on the toilet paper. If you underwent a bowel prep for your procedure, you may not have a normal bowel movement for a few days.  Please Note:  You might notice some irritation and congestion in your nose or some drainage.  This is from the oxygen used during your procedure.  There is no need for concern and it should clear up in a day or so.  SYMPTOMS TO REPORT IMMEDIATELY:   Following lower endoscopy (colonoscopy or flexible sigmoidoscopy):  Excessive amounts of blood in the stool  Significant tenderness or worsening of abdominal pains  Swelling of the abdomen that is new, acute  Fever of 100F or higher    For urgent or emergent issues, a gastroenterologist can be reached at any hour by calling [phone].   DIET:  We do recommend a small meal at first, but then you may proceed to your regular diet.  Drink plenty of fluids but you should avoid alcoholic beverages for 24 hours.  ACTIVITY:  You should plan to take it easy for the rest of today and you should NOT DRIVE or use heavy machinery until  tomorrow (because of the sedation medicines used during the test).    FOLLOW UP: Our staff will call the number listed on your records the next business day following your procedure to check on you and address any questions or concerns that you may have regarding the information given to you following your procedure. If we do not reach you, we will leave a message.  However, if you are feeling well and you are not experiencing any problems, there is no need to return our call.  We will assume that you have returned to your regular daily activities without incident.  If any biopsies were taken you will be contacted by phone or by letter within the next 1-3 weeks.  Please call us at [phone] if you have not heard about the biopsies in 3 weeks.    SIGNATURES/CONFIDENTIALITY: You and/or your care partner have signed paperwork which will be entered into your electronic medical record.  These signatures attest to the fact that that the information above on your After Visit Summary has been reviewed and is understood.  Full responsibility of the confidentiality of this discharge information lies with you and/or your care-partner.

## 2018-07-13 ENCOUNTER — Telehealth: Payer: Self-pay | Admitting: *Deleted

## 2018-07-13 ENCOUNTER — Telehealth: Payer: Self-pay

## 2018-07-13 NOTE — Telephone Encounter (Signed)
Attempted to reach patient for post-procedure f/u call. This is the 2nd call. No answer. Left message for him to please not hesitate to call us if he has any questions/concerns regarding his care. 

## 2018-07-13 NOTE — Telephone Encounter (Signed)
  Follow up Call-  Call back number 07/12/2018  Post procedure Call Back phone  # [phone]  Permission to leave phone message Yes     Patient questions:  Message left to call us if necessary.

## 2019-05-09 ENCOUNTER — Other Ambulatory Visit: Payer: Self-pay | Admitting: Urology

## 2019-05-09 DIAGNOSIS — R972 Elevated prostate specific antigen [PSA]: Secondary | ICD-10-CM

## 2019-06-06 ENCOUNTER — Other Ambulatory Visit: Payer: Self-pay

## 2019-06-06 ENCOUNTER — Ambulatory Visit
Admission: RE | Admit: 2019-06-06 | Discharge: 2019-06-06 | Disposition: A | Discharge status: Home / Self Care | Payer: BC Managed Care – PPO | Source: Ambulatory Visit | Attending: Urology | Admitting: Urology

## 2019-06-06 DIAGNOSIS — R972 Elevated prostate specific antigen [PSA]: Secondary | ICD-10-CM

## 2019-06-06 MED ORDER — GADOBENATE DIMEGLUMINE 529 MG/ML IV SOLN
13.0000 mL | Freq: Once | INTRAVENOUS | Status: AC | PRN
  Administered 2019-06-06: 13 mL via INTRAVENOUS

## 2021-04-24 LAB — COLOGUARD: COLOGUARD: NEGATIVE

## 2021-08-21 ENCOUNTER — Ambulatory Visit: Payer: Self-pay | Admitting: General Surgery

## 2021-08-21 DIAGNOSIS — K4090 Unilateral inguinal hernia, without obstruction or gangrene, not specified as recurrent: Secondary | ICD-10-CM

## 2021-09-01 ENCOUNTER — Other Ambulatory Visit (HOSPITAL_COMMUNITY): Payer: BC Managed Care – PPO

## 2021-09-01 NOTE — Progress Notes (Signed)
Your procedure is scheduled on:  09/18/2021   Report to [facility] Main  Entrance   Report to admitting at    [phone]     Call this number if you have problems the morning of surgery [phone]    REMEMBER: NO  SOLID FOOD CANDY OR GUM AFTER MIDNIGHT. CLEAR LIQUIDS UNTIL   0430am       . NOTHING BY MOUTH EXCEPT CLEAR LIQUIDS UNTIL    0430am   . PLEASE FINISH ENSURE DRINK PER SURGEON ORDER  WHICH NEEDS TO BE COMPLETED AT      .  0430am     CLEAR LIQUID DIET   Foods Allowed                                                                    Coffee and tea, regular and decaf                            Fruit ices (not with fruit pulp)                                      Iced Popsicles                                    Carbonated beverages, regular and diet                                    Cranberry, grape and apple juices Sports drinks like Gatorade Lightly seasoned clear broth or consume(fat free) Sugar, honey syrup ___________________________________________________________________      BRUSH YOUR TEETH MORNING OF SURGERY AND RINSE YOUR MOUTH OUT, NO CHEWING GUM CANDY OR MINTS.     Take these medicines the morning of surgery with A SIP OF WATER:  none   DO NOT TAKE ANY DIABETIC MEDICATIONS DAY OF YOUR SURGERY                               You may not have any metal on your body including hair pins and              piercings  Do not wear jewelry, make-up, lotions, powders or perfumes, deodorant             Do not wear nail polish on your fingernails.  Do not shave  48 hours prior to surgery.              Men may shave face and neck.   Do not bring valuables to the hospital. [place].  Contacts, dentures or bridgework may not be worn into surgery.  Leave suitcase in the car. After surgery it may be brought to your room.     Patients discharged the day of surgery will not be allowed to  drive  home. IF YOU ARE HAVING SURGERY AND GOING HOME THE SAME DAY, YOU MUST HAVE AN ADULT TO DRIVE YOU HOME AND BE WITH YOU FOR 24 HOURS. YOU MAY GO HOME BY TAXI OR UBER OR ORTHERWISE, BUT AN ADULT MUST ACCOMPANY YOU HOME AND STAY WITH YOU FOR 24 HOURS.  Name and phone number of your driver:  Special Instructions: N/A              Please read over the following fact sheets you were given: _____________________________________________________________________  [facility] - Preparing for Surgery Before surgery, you can play an important role.  Because skin is not sterile, your skin needs to be as free of germs as possible.  You can reduce the number of germs on your skin by washing with CHG (chlorahexidine gluconate) soap before surgery.  CHG is an antiseptic cleaner which kills germs and bonds with the skin to continue killing germs even after washing. Please DO NOT use if you have an allergy to CHG or antibacterial soaps.  If your skin becomes reddened/irritated stop using the CHG and inform your nurse when you arrive at Short Stay. Do not shave (including legs and underarms) for at least 48 hours prior to the first CHG shower.  You may shave your face/neck. Please follow these instructions carefully:  1.  Shower with CHG Soap the night before surgery and the  morning of Surgery.  2.  If you choose to wash your hair, wash your hair first as usual with your  normal  shampoo.  3.  After you shampoo, rinse your hair and body thoroughly to remove the  shampoo.                           4.  Use CHG as you would any other liquid soap.  You can apply chg directly  to the skin and wash                       Gently with a scrungie or clean washcloth.  5.  Apply the CHG Soap to your body ONLY FROM THE NECK DOWN.   Do not use on face/ open                           Wound or open sores. Avoid contact with eyes, ears mouth and genitals (private parts).                       Wash face,  Genitals (private parts) with  your normal soap.             6.  Wash thoroughly, paying special attention to the area where your surgery  will be performed.  7.  Thoroughly rinse your body with warm water from the neck down.  8.  DO NOT shower/wash with your normal soap after using and rinsing off  the CHG Soap.                9.  Pat yourself dry with a clean towel.            10.  Wear clean pajamas.            11.  Place clean sheets on your bed the night of your first shower and do not  sleep with pets. Day of Surgery : Do not apply any lotions/deodorants the  morning of surgery.  Please wear clean clothes to the hospital/surgery center.  FAILURE TO FOLLOW THESE INSTRUCTIONS MAY RESULT IN THE CANCELLATION OF YOUR SURGERY PATIENT SIGNATURE_________________________________  NURSE SIGNATURE__________________________________  ________________________________________________________________________

## 2021-09-01 NOTE — Progress Notes (Addendum)
Anesthesia Review:  [id number] [place], NPv Cardiologist : none  Chest x-ray : EKG : Echo : Stress test: Cardiac Cath :  Activity level: ca do a flight of stairs without difficulty  Sleep Study/ CPAP : none  Fasting Blood Sugar :      / Checks Blood Sugar -- times a day:   Blood Thinner/ Instructions /Last Dose: ASA / Instructions/ Last Dose : NO covid test - ambulatory surgery    Pt was 55 minutes late for preop appt.  Able to complete medical and surgical hx along with instructions and vitals.

## 2021-09-02 ENCOUNTER — Encounter (HOSPITAL_COMMUNITY): Payer: Self-pay

## 2021-09-02 ENCOUNTER — Other Ambulatory Visit: Payer: Self-pay

## 2021-09-02 ENCOUNTER — Encounter (HOSPITAL_COMMUNITY)
Admission: RE | Admit: 2021-09-02 | Discharge: 2021-09-02 | Disposition: A | Discharge status: Home / Self Care | Payer: 59 | Source: Ambulatory Visit | Attending: General Surgery | Admitting: General Surgery

## 2021-09-02 VITALS — BP 158/85 | HR 83 | Temp 98.1°F | Resp 16 | Ht 67.0 in | Wt 140.0 lb

## 2021-09-02 DIAGNOSIS — K759 Inflammatory liver disease, unspecified: Secondary | ICD-10-CM | POA: Diagnosis not present

## 2021-09-02 DIAGNOSIS — Z01812 Encounter for preprocedural laboratory examination: Secondary | ICD-10-CM | POA: Diagnosis not present

## 2021-09-02 DIAGNOSIS — K4090 Unilateral inguinal hernia, without obstruction or gangrene, not specified as recurrent: Secondary | ICD-10-CM | POA: Insufficient documentation

## 2021-09-02 HISTORY — DX: Depression, unspecified: F32.A

## 2021-09-02 HISTORY — DX: Malignant (primary) neoplasm, unspecified: C80.1

## 2021-09-02 HISTORY — DX: Anxiety disorder, unspecified: F41.9

## 2021-09-02 LAB — CBC
HCT: 44.8 % (ref 39.0–52.0)
Hemoglobin: 15.1 g/dL (ref 13.0–17.0)
MCH: 31 pg (ref 26.0–34.0)
MCHC: 33.7 g/dL (ref 30.0–36.0)
MCV: 92 fL (ref 80.0–100.0)
Platelets: 249 10*3/uL (ref 150–400)
RBC: 4.87 MIL/uL (ref 4.22–5.81)
RDW: 12 % (ref 11.5–15.5)
WBC: 7.3 10*3/uL (ref 4.0–10.5)
nRBC: 0 % (ref 0.0–0.2)

## 2021-09-02 LAB — BASIC METABOLIC PANEL
Anion gap: 9 (ref 5–15)
BUN: 15 mg/dL (ref 6–20)
CO2: 29 mmol/L (ref 22–32)
Calcium: 9.1 mg/dL (ref 8.9–10.3)
Chloride: 100 mmol/L (ref 98–111)
Creatinine, Ser: 0.91 mg/dL (ref 0.61–1.24)
GFR, Estimated: >60 mL/min (ref >60)
Glucose, Bld: 92 mg/dL (ref 70–99)
Potassium: 4.3 mmol/L (ref 3.5–5.1)
Sodium: 138 mmol/L (ref 135–145)

## 2021-09-18 ENCOUNTER — Encounter (HOSPITAL_COMMUNITY)
Admission: RE | Disposition: A | Discharge status: Home / Self Care | Payer: Self-pay | Source: Home / Self Care | Attending: General Surgery

## 2021-09-18 ENCOUNTER — Ambulatory Visit (HOSPITAL_COMMUNITY): Payer: 59 | Admitting: Certified Registered Nurse Anesthetist

## 2021-09-18 ENCOUNTER — Ambulatory Visit (HOSPITAL_COMMUNITY)
Admission: RE | Admit: 2021-09-18 | Discharge: 2021-09-18 | Disposition: A | Discharge status: Home / Self Care | Payer: 59 | Attending: General Surgery | Admitting: General Surgery

## 2021-09-18 ENCOUNTER — Encounter (HOSPITAL_COMMUNITY): Payer: Self-pay | Admitting: General Surgery

## 2021-09-18 DIAGNOSIS — K4090 Unilateral inguinal hernia, without obstruction or gangrene, not specified as recurrent: Secondary | ICD-10-CM | POA: Diagnosis present

## 2021-09-18 DIAGNOSIS — Z87891 Personal history of nicotine dependence: Secondary | ICD-10-CM | POA: Insufficient documentation

## 2021-09-18 DIAGNOSIS — M1990 Unspecified osteoarthritis, unspecified site: Secondary | ICD-10-CM | POA: Diagnosis not present

## 2021-09-18 HISTORY — PX: INGUINAL HERNIA REPAIR: SHX194

## 2021-09-18 SURGERY — REPAIR, HERNIA, INGUINAL, ADULT
Anesthesia: General | Laterality: Right

## 2021-09-18 MED ORDER — ONDANSETRON HCL 4 MG/2ML IJ SOLN
INTRAMUSCULAR | Status: AC
  Filled 2021-09-18: qty 2

## 2021-09-18 MED ORDER — CHLORHEXIDINE GLUCONATE 0.12 % MT SOLN: 15.0000 mL | Freq: Once | OROMUCOSAL | Status: AC

## 2021-09-18 MED ORDER — CELECOXIB 200 MG PO CAPS
400.0000 mg | ORAL_CAPSULE | ORAL | Status: AC
  Administered 2021-09-18: 400 mg via ORAL
  Filled 2021-09-18: qty 2

## 2021-09-18 MED ORDER — FENTANYL CITRATE PF 50 MCG/ML IJ SOSY
25.0000 ug | PREFILLED_SYRINGE | INTRAMUSCULAR | Status: DC | PRN
  Administered 2021-09-18: 25 ug via INTRAVENOUS
  Administered 2021-09-18: 50 ug via INTRAVENOUS
  Administered 2021-09-18: 25 ug via INTRAVENOUS

## 2021-09-18 MED ORDER — AMISULPRIDE (ANTIEMETIC) 5 MG/2ML IV SOLN: 10.0000 mg | Freq: Once | INTRAVENOUS | Status: DC | PRN

## 2021-09-18 MED ORDER — SODIUM CHLORIDE 0.9 % IR SOLN
Status: DC | PRN
  Administered 2021-09-18: 1

## 2021-09-18 MED ORDER — ORAL CARE MOUTH RINSE
15.0000 mL | Freq: Once | OROMUCOSAL | Status: AC
  Administered 2021-09-18: 15 mL via OROMUCOSAL

## 2021-09-18 MED ORDER — FENTANYL CITRATE (PF) 100 MCG/2ML IJ SOLN
INTRAMUSCULAR | Status: DC | PRN
  Administered 2021-09-18: 100 ug via INTRAVENOUS
  Administered 2021-09-18: 50 ug via INTRAVENOUS

## 2021-09-18 MED ORDER — LACTATED RINGERS IV SOLN: INTRAVENOUS | Status: DC

## 2021-09-18 MED ORDER — CLONIDINE HCL (ANALGESIA) 100 MCG/ML EP SOLN
EPIDURAL | Status: DC | PRN
  Administered 2021-09-18: 50 ug

## 2021-09-18 MED ORDER — BUPIVACAINE HCL (PF) 0.5 % IJ SOLN
INTRAMUSCULAR | Status: AC
  Filled 2021-09-18: qty 30

## 2021-09-18 MED ORDER — FENTANYL CITRATE (PF) 250 MCG/5ML IJ SOLN
INTRAMUSCULAR | Status: AC
  Filled 2021-09-18: qty 5

## 2021-09-18 MED ORDER — CHLORHEXIDINE GLUCONATE CLOTH 2 % EX PADS: 6.0000 | MEDICATED_PAD | Freq: Once | CUTANEOUS | Status: DC

## 2021-09-18 MED ORDER — BUPIVACAINE LIPOSOME 1.3 % IJ SUSP
INTRAMUSCULAR | Status: AC
  Filled 2021-09-18: qty 20

## 2021-09-18 MED ORDER — ENSURE PRE-SURGERY PO LIQD: 296.0000 mL | Freq: Once | ORAL | Status: DC

## 2021-09-18 MED ORDER — CEFAZOLIN SODIUM-DEXTROSE 2-4 GM/100ML-% IV SOLN
2.0000 g | INTRAVENOUS | Status: AC
  Administered 2021-09-18: 2 g via INTRAVENOUS
  Filled 2021-09-18: qty 100

## 2021-09-18 MED ORDER — OXYCODONE HCL 5 MG PO TABS: 5.0000 mg | ORAL_TABLET | Freq: Four times a day (QID) | ORAL | 0 refills | Status: DC | PRN

## 2021-09-18 MED ORDER — ACETAMINOPHEN 500 MG PO TABS
1000.0000 mg | ORAL_TABLET | ORAL | Status: AC
  Administered 2021-09-18: 1000 mg via ORAL
  Filled 2021-09-18: qty 2

## 2021-09-18 MED ORDER — ROCURONIUM BROMIDE 10 MG/ML (PF) SYRINGE
PREFILLED_SYRINGE | INTRAVENOUS | Status: AC
  Filled 2021-09-18: qty 10

## 2021-09-18 MED ORDER — ONDANSETRON HCL 4 MG/2ML IJ SOLN
INTRAMUSCULAR | Status: DC | PRN
  Administered 2021-09-18: 4 mg via INTRAVENOUS

## 2021-09-18 MED ORDER — DEXAMETHASONE SODIUM PHOSPHATE 10 MG/ML IJ SOLN
INTRAMUSCULAR | Status: AC
  Filled 2021-09-18: qty 1

## 2021-09-18 MED ORDER — BUPIVACAINE HCL 0.5 % IJ SOLN
INTRAMUSCULAR | Status: DC | PRN
  Administered 2021-09-18: 10 mL

## 2021-09-18 MED ORDER — ROPIVACAINE HCL 5 MG/ML IJ SOLN
INTRAMUSCULAR | Status: DC | PRN
  Administered 2021-09-18: 30 mL

## 2021-09-18 MED ORDER — IBUPROFEN 800 MG PO TABS: 800.0000 mg | ORAL_TABLET | Freq: Three times a day (TID) | ORAL | 0 refills | Status: DC | PRN

## 2021-09-18 MED ORDER — MIDAZOLAM HCL 5 MG/5ML IJ SOLN
INTRAMUSCULAR | Status: DC | PRN
  Administered 2021-09-18: 2 mg via INTRAVENOUS

## 2021-09-18 MED ORDER — MIDAZOLAM HCL 2 MG/2ML IJ SOLN
INTRAMUSCULAR | Status: AC
  Filled 2021-09-18: qty 2

## 2021-09-18 MED ORDER — FENTANYL CITRATE PF 50 MCG/ML IJ SOSY
PREFILLED_SYRINGE | INTRAMUSCULAR | Status: AC
  Filled 2021-09-18: qty 2

## 2021-09-18 MED ORDER — PROPOFOL 10 MG/ML IV BOLUS
INTRAVENOUS | Status: AC
  Filled 2021-09-18: qty 20

## 2021-09-18 MED ORDER — PROPOFOL 10 MG/ML IV BOLUS
INTRAVENOUS | Status: DC | PRN
  Administered 2021-09-18: 200 mg via INTRAVENOUS

## 2021-09-18 MED ORDER — DEXAMETHASONE SODIUM PHOSPHATE 10 MG/ML IJ SOLN
INTRAMUSCULAR | Status: DC | PRN
  Administered 2021-09-18: 10 mg via INTRAVENOUS

## 2021-09-18 MED ORDER — LIDOCAINE 2% (20 MG/ML) 5 ML SYRINGE
INTRAMUSCULAR | Status: DC | PRN
Start: 2021-09-18 — End: 2021-09-18
  Administered 2021-09-18: 50 mg via INTRAVENOUS

## 2021-09-18 SURGICAL SUPPLY — 39 items
BAG COUNTER SPONGE SURGICOUNT (BAG) ×2 IMPLANT
BENZOIN TINCTURE PRP APPL 2/3 (GAUZE/BANDAGES/DRESSINGS) IMPLANT
BLADE SURG 15 STRL LF DISP TIS (BLADE) ×1 IMPLANT
BLADE SURG 15 STRL SS (BLADE) ×1
CHLORAPREP W/TINT 26 (MISCELLANEOUS) ×2 IMPLANT
COVER SURGICAL LIGHT HANDLE (MISCELLANEOUS) ×2 IMPLANT
DECANTER SPIKE VIAL GLASS SM (MISCELLANEOUS) ×2 IMPLANT
DERMABOND ADVANCED (GAUZE/BANDAGES/DRESSINGS) ×1
DERMABOND ADVANCED .7 DNX12 (GAUZE/BANDAGES/DRESSINGS) ×1 IMPLANT
DRAIN PENROSE 0.5X18 (DRAIN) IMPLANT
DRAPE LAPAROTOMY TRNSV 102X78 (DRAPES) ×2 IMPLANT
DRAPE UTILITY 15X26 TOWEL STRL (DRAPES) ×2 IMPLANT
DRSG TELFA PLUS 4X6 ADH ISLAND (GAUZE/BANDAGES/DRESSINGS) IMPLANT
ELECT REM PT RETURN 15FT ADLT (MISCELLANEOUS) ×2 IMPLANT
GAUZE SPONGE 4X4 12PLY STRL (GAUZE/BANDAGES/DRESSINGS) IMPLANT
GLOVE SURG POLYISO LF SZ7 (GLOVE) ×2 IMPLANT
GLOVE SURG UNDER POLY LF SZ7 (GLOVE) ×2 IMPLANT
GOWN STRL REUS W/TWL LRG LVL3 (GOWN DISPOSABLE) ×2 IMPLANT
GOWN STRL REUS W/TWL XL LVL3 (GOWN DISPOSABLE) ×2 IMPLANT
KIT BASIN OR (CUSTOM PROCEDURE TRAY) ×2 IMPLANT
KIT TURNOVER KIT A (KITS) IMPLANT
MARKER SKIN DUAL TIP RULER LAB (MISCELLANEOUS) ×2 IMPLANT
MESH HERNIA 3X6 (Mesh General) ×1 IMPLANT
NEEDLE HYPO 22GX1.5 SAFETY (NEEDLE) ×2 IMPLANT
PACK BASIC VI WITH GOWN DISP (CUSTOM PROCEDURE TRAY) ×2 IMPLANT
PENCIL SMOKE EVACUATOR (MISCELLANEOUS) ×2 IMPLANT
SPONGE T-LAP 4X18 ~~LOC~~+RFID (SPONGE) ×2 IMPLANT
STRIP CLOSURE SKIN 1/2X4 (GAUZE/BANDAGES/DRESSINGS) IMPLANT
SUT MNCRL AB 4-0 PS2 18 (SUTURE) ×2 IMPLANT
SUT PDS AB 2-0 CT2 27 (SUTURE) ×1 IMPLANT
SUT PROLENE 2 0 CT2 30 (SUTURE) ×4 IMPLANT
SUT VIC AB 3-0 SH 18 (SUTURE) ×2 IMPLANT
SUT VIC AB 3-0 SH 27 (SUTURE) ×2
SUT VIC AB 3-0 SH 27XBRD (SUTURE) ×2 IMPLANT
SYR BULB IRRIG 60ML STRL (SYRINGE) ×2 IMPLANT
SYR CONTROL 10ML LL (SYRINGE) ×2 IMPLANT
TOWEL OR 17X26 10 PK STRL BLUE (TOWEL DISPOSABLE) ×2 IMPLANT
TOWEL OR NON WOVEN STRL DISP B (DISPOSABLE) ×2 IMPLANT
YANKAUER SUCT BULB TIP 10FT TU (MISCELLANEOUS) IMPLANT

## 2021-09-18 NOTE — Op Note (Signed)
Preop diagnosis: right inguinal hernia  Postop diagnosis: right inguinal hernia  Procedure: open Right inguinal hernia repair with mesh  Surgeon: Gurney Maxin, M.D.  Asst: none  Anesthesia: Gen.   Indications for procedure: Jeff Romero is a 60 y.o. male with symptoms of pain and enlarging Right inguinal hernia(s). After discussing risks, alternatives and benefits he decided on open repair and was brought to day surgery for repair.  Description of procedure: The patient was brought into the operative suite, placed supine. Anesthesia was administered with endotracheal tube. Patient was strapped in place. The patient was prepped and draped in the usual sterile fashion.  The anterior superior iliac spine and pubic tubercle were identified on the Right side. An incision was made 1cm above the connecting line, representative of the location of the inguinal ligament. The subcutaneous tissue was bluntly dissected, scarpa's fascia was dissected away. The external abdominal oblique fascia was identified and sharply opened down to the external inguinal ring. The conjoint tendon and inguinal ligament were identified. The cord structures and sac were dissected free of the surrounding tissue in 360 degrees. A penrose drain was used to encircle the contents. The cremasteric fibers were dissected free of the contents of the cord and hernia sac. The cord structures (vessels and vas deferens) were identified and carefully dissected away from the hernia sac. The hernia was direct and contained omentum. The hernia sac was dissected down to the internal inguinal ring. Preperitoneal fat was identified showing appropriate dissection. The sac was then reduced into the preperitoneal space. A 2-0 PDS was used to appose the conjoint tendon to the inguinal ligament. A 3x6 Bard mesh was then used to close the defect and reinforce the floor. The mesh was sutured to the lacunar ligament and inguinal ligament using a 2-0 prolene  in running fashion. Next the superior edge of the mesh was sutured to the conjoined tendon using a 2-0 running Prolene. An additional 2-0 Prolene was used to suture the tail ends of the mesh together re-creating the deep ring. Cord structures are running in a neutral position through the mesh. Next the external abdominal oblique fascia was closed with a 2-0 Vicryl in interrupted fashion to re-create the external inguinal ring. Scarpa's fascia was closed with 3-0 Vicryl in running fashion. Skin was closed with a 4-0 Monocryl subcuticular stitch in running fashion. Dermabond place for dressing. Patient woke from anesthesia and brought to PACU in stable condition. All counts are correct.  Findings: right inguinal hernia  Specimen: none  Blood loss: 10 ml  Local anesthesia: 10 ml Marcaine  Complications: none  Implant: 3 x 6 Bard mesh  Gurney Maxin, M.D. General, Bariatric, & Minimally Invasive Surgery [facility], [place] 8:47 AM 09/18/2021

## 2021-09-18 NOTE — Anesthesia Postprocedure Evaluation (Signed)
Anesthesia Post Note  Patient: Jeff Romero  Procedure(s) Performed: OPEN RIGHT INGUINAL HERNIA REPAIR WITH MESH (Right)     Patient location during evaluation: PACU Anesthesia Type: General Level of consciousness: awake and alert Pain management: pain level controlled Vital Signs Assessment: post-procedure vital signs reviewed and stable Respiratory status: spontaneous breathing, nonlabored ventilation, respiratory function stable and patient connected to nasal cannula oxygen Cardiovascular status: blood pressure returned to baseline and stable Postop Assessment: no apparent nausea or vomiting Anesthetic complications: no   No notable events documented.  Last Vitals:  Vitals:   09/18/21 0930 09/18/21 0943  BP: 129/78 (!) 144/91  Pulse: 82 80  Resp: 10 16  Temp: 36.6 C   SpO2: 95% 100%    Last Pain:  Vitals:   09/18/21 0943  TempSrc:   PainSc: 3                  Tiajuana Amass

## 2021-09-18 NOTE — Anesthesia Procedure Notes (Signed)
Anesthesia Regional Block: TAP block   Pre-Anesthetic Checklist: , timeout performed,  Correct Patient, Correct Site, Correct Laterality,  Correct Procedure, Correct Position, site marked,  Risks and benefits discussed,  Surgical consent,  Pre-op evaluation,  At surgeon's request and post-op pain management  Laterality: Right  Prep: chloraprep       Needles:  Injection technique: Single-shot  Needle Type: Echogenic Needle     Needle Length: 9cm  Needle Gauge: 21     Additional Needles:   Procedures:,,,, ultrasound used (permanent image in chart),,    Narrative:  Start time: 09/18/2021 6:55 AM End time: 09/18/2021 7:01 AM Injection made incrementally with aspirations every 5 mL.  Performed by: Personally  Anesthesiologist: Suzette Battiest, MD

## 2021-09-18 NOTE — Anesthesia Procedure Notes (Signed)
Procedure Name: LMA Insertion Date/Time: 09/18/2021 8:06 AM Performed by: Gerald Leitz, CRNA Pre-anesthesia Checklist: Patient identified, Patient being monitored, Timeout performed, Emergency Drugs available and Suction available Patient Re-evaluated:Patient Re-evaluated prior to induction Oxygen Delivery Method: Circle system utilized Preoxygenation: Pre-oxygenation with 100% oxygen Induction Type: IV induction Ventilation: Mask ventilation without difficulty LMA: LMA inserted LMA Size: 4.0 Tube type: Oral Number of attempts: 1 Placement Confirmation: positive ETCO2 and breath sounds checked- equal and bilateral Tube secured with: Tape Dental Injury: Teeth and Oropharynx as per pre-operative assessment

## 2021-09-18 NOTE — Transfer of Care (Signed)
Immediate Anesthesia Transfer of Care Note  Patient: Jeff Romero  Procedure(s) Performed: Procedure(s): OPEN RIGHT INGUINAL HERNIA REPAIR WITH MESH (Right)  Patient Location: PACU  Anesthesia Type:General  Level of Consciousness: Alert, Awake, Oriented  Airway & Oxygen Therapy: Patient Spontanous Breathing  Post-op Assessment: Report given to RN  Post vital signs: Reviewed and stable  Last Vitals:  Vitals:   09/18/21 0551 09/18/21 0851  BP: 105/86 115/86  Pulse: (!) 103 90  Resp: 18 14  Temp: 36.9 C (!) 36.4 C  SpO2: 947% 076%    Complications: No apparent anesthesia complications

## 2021-09-18 NOTE — H&P (Signed)
°  Chief Complaint: Inguinal Hernia   History of Present Illness: Jeff Romero is a 60 y.o. male who is seen today as an office consultation for evaluation of Inguinal Hernia .   He first noticed the hernia 2 months ago. Symptoms are mild soreness in the area with activity. He denies nausea or vomiting or bowel habit change.  He does not smoke He does not have diabetes He has no history of hernias  Review of Systems: A complete review of systems was obtained from the patient. I have reviewed this information and discussed as appropriate with the patient. See HPI as well for other ROS.  ROS   Medical History: Past Medical History:  Diagnosis Date   Arthritis   History of cancer   There is no problem list on file for this patient.  Past Surgical History:  Procedure Laterality Date   PROSTATE SURGERY    No Known Allergies  Current Outpatient Medications on File Prior to Visit  Medication Sig Dispense Refill   vilazodone (VIIBRYD) 20 mg tablet Viibryd 20 mg tablet Take 1 tablet by mouth once daily   No current facility-administered medications on file prior to visit.   History reviewed. No pertinent family history.   Social History   Tobacco Use  Smoking Status Former Smoker  Smokeless Tobacco Never Used    Social History   Socioeconomic History   Marital status: Married  Tobacco Use   Smoking status: Former Smoker   Smokeless tobacco: Never Used  Substance and Sexual Activity   Alcohol use: Yes   Drug use: Not Currently   Objective:   Vitals:  05/22/21 1509  BP: (!) 140/92  Pulse: 93  Weight: 61.7 kg (136 lb)  Height: 171.5 cm (5' 7.5")   Body mass index is 20.99 kg/m.  Physical Exam Constitutional:  Appearance: Normal appearance.  HENT:  Head: Normocephalic and atraumatic.  Pulmonary:  Effort: Pulmonary effort is normal.  Abdominal:  Comments: Moderate right inguinal hernia  Musculoskeletal:  General: Normal range of motion.  Cervical  back: Normal range of motion.  Neurological:  General: No focal deficit present.  Mental Status: He is alert and oriented to person, place, and time. Mental status is at baseline.  Psychiatric:  Mood and Affect: Mood normal.  Behavior: Behavior normal.  Thought Content: Thought content normal.    Labs, Imaging and Diagnostic Testing: Reviewed notes by Tresa Endo urology  Assessment and Plan:  Diagnoses and all orders for this visit:  Non-recurrent unilateral inguinal hernia without obstruction or gangrene - [facility] Case Posting Request; Future    We discussed etiology of hernias and how they can cause pain. We discussed options for inguinal hernia repair vs observation. We discussed details of the surgery of general anesthesia, surgical approach and incisions, dissecting the sack away from vas deference, testicular vessels and nerves and placement of mesh. We discussed risks of bleeding, infection, recurrence, injury to vas deference, testicular vessels, nerve injury, and chronic pain. He showed good understanding and wanted proceed with open right inguinal hernia repair as outpatient.

## 2021-09-18 NOTE — Anesthesia Preprocedure Evaluation (Signed)
Anesthesia Evaluation  Patient identified by MRN, date of birth, ID band Patient awake    Reviewed: Allergy & Precautions, NPO status , Patient's Chart, lab work & pertinent test results  Airway Mallampati: II  TM Distance: >3 FB Neck ROM: Full    Dental  (+) Dental Advisory Given   Pulmonary former smoker,    breath sounds clear to auscultation       Cardiovascular negative cardio ROS   Rhythm:Regular Rate:Normal     Neuro/Psych negative neurological ROS     GI/Hepatic negative GI ROS, Neg liver ROS,   Endo/Other  negative endocrine ROS  Renal/GU negative Renal ROS     Musculoskeletal  (+) Arthritis ,   Abdominal   Peds  Hematology negative hematology ROS (+)   Anesthesia Other Findings   Reproductive/Obstetrics                             Anesthesia Physical Anesthesia Plan  ASA: 2  Anesthesia Plan: General   Post-op Pain Management: Regional block, Celebrex PO (pre-op) and Tylenol PO (pre-op)   Induction:   PONV Risk Score and Plan: 2 and Ondansetron, Dexamethasone and Treatment may vary due to age or medical condition  Airway Management Planned: LMA and Oral ETT  Additional Equipment:   Intra-op Plan:   Post-operative Plan: Extubation in OR  Informed Consent: I have reviewed the patients History and Physical, chart, labs and discussed the procedure including the risks, benefits and alternatives for the proposed anesthesia with the patient or authorized representative who has indicated his/her understanding and acceptance.     Dental advisory given  Plan Discussed with: CRNA  Anesthesia Plan Comments:         Anesthesia Quick Evaluation

## 2021-09-19 ENCOUNTER — Encounter (HOSPITAL_COMMUNITY): Payer: Self-pay | Admitting: General Surgery

## 2021-12-05 NOTE — Progress Notes (Signed)
? ? ? ?12/08/2021 ?Jeff Romero ?[id number] ?01/24/1961 ? ? ?ASSESSMENT AND PLAN:  ? ?Anemia, unspecified type ?-     CBC with Differential/Platelet; Future ?-     Iron and TIBC; Future ?-     Ferritin; Future ?Normal colon 2019, normal cologuard 2022 ?Has no symptoms ?HGB 15 08/2021, had right inguinal hernia surgery 09/18/2021 with bleeding post surgery and hematoma per wife/patient, repeat HGB was 12.6 with normal MCV on 11/17/21 ?Will recheck CBC/iron ferritin today, has been on iron x 3 weeks, if still low will schedule EGD, if normal will stop iron and recheck CBC, iron/ferritin, CMET in 2-3 months with follow up. If he has an iron def anemia at that time will schedule for endoscopic evaluation.  ?Patient and wife agree to this plan, they will also call if they have any symptoms ? ?Patient Care Team: ?Everardo Beals, NP as PCP - General ? ?HISTORY OF PRESENT ILLNESS: ?61 y.o. male referred by Everardo Beals, NP, with a past medical history of  hyperbilirubinemia/hepatitis 2017, prostate cancer status post radical prostatectomy with lymph node resection 08/23/2019 and others listed below presents for evaluation of anemia.  ?Known to Dr. Loletha Carrow ?Cologuard negative 04/21/2021 ?07/12/2018 Dr. Loletha Carrow colonoscopy screening was completely normal recall 10 years. 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 ?Patient had normal HGB 15 08/2021, had right inguinal hernia surgery 09/18/2021, repeat HGB was 12.6 with normal MCV on 11/17/21.  ?Wife is here and states he was having some moderate bleeding from the site, called the surgeon, was healing. Had swelling at the site.  ?Denies nausea, vomiting, GERD.  ?Denies weight loss. No AB pain.  ?Denies changes in bowel, no melena, no hematochezia.  ?Has been on iron x 2-3 weeks.  ?Quit smoking 5  years ago, 5-6 drinks a week but has cut back significantly.  ? ?CBC  09/02/2021  ?HGB 15.1 MCV 92.0 -  ?compared to labs from 11/17/2021 with [facility] showed HGB 12.6, MCV 90.4 ?WBC 7.3 Platelets 249 ?Kidney function 09/02/2021  ?BUN 15 Cr 0.91  ?GFR >60  ?Potassium 4.3   ? ?Current Medications:  ? ? ?Current Outpatient Medications (Cardiovascular):  ?  alprostadil (EDEX) 10 MCG injection, 10 mcg by Intracavitary route as needed for erectile dysfunction. use no more than 3 times per week ?  fenofibrate 160 MG tablet, Take 160 mg by mouth daily. ? ? ?Current Outpatient Medications (Analgesics):  ?  ibuprofen (ADVIL) 800 MG tablet, Take 1 tablet (800 mg total) by mouth every 8 (eight) hours as needed. ? ? ?Current Outpatient Medications (Other):  ?  Biotin 5000 MCG CAPS, Take 5,000 mcg by mouth daily. ?  Cholecalciferol (VITAMIN D) 50 MCG (2000 UT) tablet, Take 2,000 Units by mouth daily. ?  CREATINE PO, Take 1 tablet by mouth daily. ?  Misc Natural Products (GINSENG COMPLEX PO), Take 1 capsule by mouth daily. ?  Multiple Vitamin (MULTIVITAMIN WITH MINERALS) TABS tablet, Take 1 tablet by mouth daily. ?  Omega-3 Fatty Acids (FISH OIL) 1000 MG CAPS, Take 1,000 mg by mouth daily. ?  Potassium 99 MG TABS, Take 99 mg by mouth daily. ?  Saw Palmetto 450 MG CAPS, Take 450 mg by mouth daily. ?  tetrahydrozoline 0.05 % ophthalmic solution, Place 1 drop into both eyes 2 (two) times daily as needed (dry/irritated eyes). ?  Vilazodone HCl (VIIBRYD) 40  MG TABS, Take 20 mg by mouth daily. ?  vitamin E 1000 UNIT capsule, Take 1,000 Units by mouth daily. ? ?Medical History:  ?Past Medical History:  ?Diagnosis Date  ? Allergy   ? Anxiety   ? Arthritis   ? Blood transfusion without reported diagnosis   ? Cancer [facility])   ? Cataract   ? Depression   ? ?Allergies: No Known Allergies  ? ?Surgical History:  ?He  has a past surgical history that includes Rotator cuff repair (Right); Knee cartilage surgery (Right); Prostate surgery; and  Inguinal hernia repair (Right, 09/18/2021). ?Family History:  ?His family history includes Hypertension in his brother and sister. ?Social History:  ? reports that he quit smoking about 7 years ago. His smoking use included e-cigarettes. He has never used smokeless tobacco. He reports current alcohol use of about 6.0 standard drinks per week. He reports that he does not use drugs. ? ?REVIEW OF SYSTEMS  : All other systems reviewed and negative except where noted in the History of Present Illness. ? ? ?PHYSICAL EXAM: ?BP 122/64   Pulse 68   Ht 5' 7.5" (1.715 m)   Wt 140 lb (63.5 kg)   BMI 21.60 kg/m?  ?General:   Pleasant, well developed male in no acute distress ?Head:  Normocephalic and atraumatic. ?Eyes: sclerae anicteric,conjunctive pink  ?Heart:  regular rate and rhythm ?Pulm: Clear anteriorly; no wheezing ?Abdomen:  Soft, Flat AB, skin exam  well healed keloid scars on AB Normal bowel sounds.  no  tenderness . Without guarding and Without rebound, without hepatomegaly. ?Extremities:  Without edema. ?Msk:  Symmetrical without gross deformities. Peripheral pulses intact.  ?Neurologic:  Alert and  oriented x4;  grossly normal neurologically. ?Skin:   Dry and intact without significant lesions or rashes. ?Psychiatric: Demonstrates good judgement and reason without abnormal affect or behaviors. ? ? ?Vladimir Crofts, PA-C ?11:37 AM ? ? ?

## 2021-12-08 ENCOUNTER — Encounter: Payer: Self-pay | Admitting: Physician Assistant

## 2021-12-08 ENCOUNTER — Ambulatory Visit (INDEPENDENT_AMBULATORY_CARE_PROVIDER_SITE_OTHER): Payer: 59 | Admitting: Physician Assistant

## 2021-12-08 ENCOUNTER — Other Ambulatory Visit (INDEPENDENT_AMBULATORY_CARE_PROVIDER_SITE_OTHER): Payer: 59

## 2021-12-08 VITALS — BP 122/64 | HR 68 | Ht 67.5 in | Wt 140.0 lb

## 2021-12-08 DIAGNOSIS — D649 Anemia, unspecified: Secondary | ICD-10-CM

## 2021-12-08 LAB — CBC WITH DIFFERENTIAL/PLATELET
Basophils Absolute: 0 10*3/uL (ref 0.0–0.1)
Basophils Relative: 0.7 % (ref 0.0–3.0)
Eosinophils Absolute: 0.1 10*3/uL (ref 0.0–0.7)
Eosinophils Relative: 1.9 % (ref 0.0–5.0)
HCT: 39.2 % (ref 39.0–52.0)
Hemoglobin: 13.1 g/dL (ref 13.0–17.0)
Lymphocytes Relative: 22.5 % (ref 12.0–46.0)
Lymphs Abs: 1.3 10*3/uL (ref 0.7–4.0)
MCHC: 33.4 g/dL (ref 30.0–36.0)
MCV: 92.2 fl (ref 78.0–100.0)
Monocytes Absolute: 0.5 10*3/uL (ref 0.1–1.0)
Monocytes Relative: 8.2 % (ref 3.0–12.0)
Neutro Abs: 3.7 10*3/uL (ref 1.4–7.7)
Neutrophils Relative %: 66.7 % (ref 43.0–77.0)
Platelets: 240 10*3/uL (ref 150.0–400.0)
RBC: 4.26 Mil/uL (ref 4.22–5.81)
RDW: 14.1 % (ref 11.5–15.5)
WBC: 5.6 10*3/uL (ref 4.0–10.5)

## 2021-12-08 LAB — FERRITIN: Ferritin: 329.3 ng/mL — ABNORMAL HIGH (ref 22.0–322.0)

## 2021-12-08 NOTE — Patient Instructions (Addendum)
Will go to the ER or call the office if they have any melena (black stool), hematochezia (blood in stool), coffee ground vomiting, severe pain, severe  ?shortness of breath or chest pain.  ? ?Follow up in 3 months ? ?Stop iron now ? ?Will get labs to recheck now and then repeat in 2-3 months with hemoccult ? ?Due to recent changes in healthcare laws, you may see the results of your imaging and laboratory studies on MyChart before your provider has had a chance to review them.  We understand that in some cases there may be results that are confusing or concerning to you. Not all laboratory results come back in the same time frame and the provider may be waiting for multiple results in order to interpret others.  Please give [place] 48 hours in order for your provider to thoroughly review all the results before contacting the office for clarification of your results.   ? ?If you are age 63 or older, your body mass index should be between 23-30. Your Body mass index is 21.6 kg/m?Marland Kitchen If this is out of the aforementioned range listed, please consider follow up with your Primary Care Provider. ? ?If you are age 82 or younger, your body mass index should be between 19-25. Your Body mass index is 21.6 kg/m?Marland Kitchen If this is out of the aformentioned range listed, please consider follow up with your Primary Care Provider.  ? ?________________________________________________________ ? ?The [facility] providers would like to encourage you to use [facility] to communicate with providers for non-urgent requests or questions.  Due to long hold times on the telephone, sending your provider a message by [facility] may be a faster and more efficient way to get a response.  Please allow 48 business hours for a response.  Please remember that this is for non-urgent requests.  ?_______________________________________________________  ? ?I appreciate the  opportunity to care for you ? ?Thank You  ? ?Vicie Mutters, PA-C ? ? ?

## 2021-12-08 NOTE — Progress Notes (Signed)
____________________________________________________________ ? ?Attending physician addendum: ? ?Thank you for sending this case to me. ?I have reviewed the entire note and agree with the plan. ? ?His CBC is normal today and ferritin is 329.  He does not have iron deficiency anemia and does not need endoscopic testing.  He can be returned to primary care for periodic check of his blood work. ? ?Wilfrid Lund, MD ? ?____________________________________________________________ ? ?

## 2021-12-09 ENCOUNTER — Other Ambulatory Visit: Payer: Self-pay

## 2021-12-09 DIAGNOSIS — D649 Anemia, unspecified: Secondary | ICD-10-CM

## 2021-12-09 LAB — IRON AND TIBC
Iron Saturation: 31 % (ref 15–55)
Iron: 101 ug/dL (ref 38–169)
Total Iron Binding Capacity: 330 ug/dL (ref 250–450)
UIBC: 229 ug/dL (ref 111–343)

## 2021-12-18 ENCOUNTER — Other Ambulatory Visit: Payer: Self-pay | Admitting: *Deleted

## 2021-12-18 NOTE — Progress Notes (Signed)
Future labs for June ?

## 2022-02-11 ENCOUNTER — Other Ambulatory Visit (INDEPENDENT_AMBULATORY_CARE_PROVIDER_SITE_OTHER): Payer: 59

## 2022-02-11 ENCOUNTER — Telehealth: Payer: Self-pay

## 2022-02-11 DIAGNOSIS — D649 Anemia, unspecified: Secondary | ICD-10-CM

## 2022-02-11 LAB — CBC WITH DIFFERENTIAL/PLATELET
Basophils Absolute: 0.1 10*3/uL (ref 0.0–0.1)
Basophils Relative: 0.8 % (ref 0.0–3.0)
Eosinophils Absolute: 0.2 10*3/uL (ref 0.0–0.7)
Eosinophils Relative: 2.1 % (ref 0.0–5.0)
HCT: 37.5 % — ABNORMAL LOW (ref 39.0–52.0)
Hemoglobin: 12.4 g/dL — ABNORMAL LOW (ref 13.0–17.0)
Lymphocytes Relative: 29.2 % (ref 12.0–46.0)
Lymphs Abs: 2.4 10*3/uL (ref 0.7–4.0)
MCHC: 33.1 g/dL (ref 30.0–36.0)
MCV: 90.6 fl (ref 78.0–100.0)
Monocytes Absolute: 1 10*3/uL (ref 0.1–1.0)
Monocytes Relative: 12.4 % — ABNORMAL HIGH (ref 3.0–12.0)
Neutro Abs: 4.5 10*3/uL (ref 1.4–7.7)
Neutrophils Relative %: 55.5 % (ref 43.0–77.0)
Platelets: 226 10*3/uL (ref 150.0–400.0)
RBC: 4.14 Mil/uL — ABNORMAL LOW (ref 4.22–5.81)
RDW: 13.9 % (ref 11.5–15.5)
WBC: 8.2 10*3/uL (ref 4.0–10.5)

## 2022-02-11 LAB — IBC + FERRITIN
Ferritin: 377.1 ng/mL — ABNORMAL HIGH (ref 22.0–322.0)
Iron: 39 ug/dL — ABNORMAL LOW (ref 42–165)
Saturation Ratios: 11.6 % — ABNORMAL LOW (ref 20.0–50.0)
TIBC: 336 ug/dL (ref 250.0–450.0)
Transferrin: 240 mg/dL (ref 212.0–360.0)

## 2022-02-11 NOTE — Telephone Encounter (Signed)
Spoke with patient's wife & reminder her that patient will need to come in for follow up lab work. She verbalized understanding & said they would come in sometime this week.  ?

## 2022-02-12 ENCOUNTER — Other Ambulatory Visit: Payer: Self-pay

## 2022-02-12 ENCOUNTER — Telehealth: Payer: Self-pay

## 2022-02-12 DIAGNOSIS — D509 Iron deficiency anemia, unspecified: Secondary | ICD-10-CM

## 2022-02-12 DIAGNOSIS — D649 Anemia, unspecified: Secondary | ICD-10-CM

## 2022-02-12 NOTE — Telephone Encounter (Signed)
Spoke with patient's wife regarding results & recommendations. He has been scheduled for EGD in [place] with Dr. Loletha Carrow on 02/24/22 at 10:00. Wife is aware that they will need to be here one hour prior & she will need to be here the whole time. Instructions have been mailed to home address. He is not on any blood thinners or diabetic. Amb ref placed.  ?

## 2022-02-12 NOTE — Telephone Encounter (Signed)
Inbound call from patients wife stating that patient has some concerns about the procedures that they would like to discuss with the nurse and is requesting a call back. Please advise.  ?[phone]  ?

## 2022-02-12 NOTE — Telephone Encounter (Signed)
Returned call to patient & wife, and answered all questions regarding upcoming EGD. He feels like that all questions have been answered, and is okay to move forward with procedure.  ?

## 2022-02-24 ENCOUNTER — Encounter: Payer: Self-pay | Admitting: Gastroenterology

## 2022-02-24 ENCOUNTER — Ambulatory Visit (AMBULATORY_SURGERY_CENTER): Payer: Self-pay | Admitting: Gastroenterology

## 2022-02-24 VITALS — BP 137/74 | HR 62 | Temp 97.7°F | Resp 12 | Ht 67.0 in | Wt 140.0 lb

## 2022-02-24 DIAGNOSIS — K2970 Gastritis, unspecified, without bleeding: Secondary | ICD-10-CM

## 2022-02-24 DIAGNOSIS — B9681 Helicobacter pylori [H. pylori] as the cause of diseases classified elsewhere: Secondary | ICD-10-CM

## 2022-02-24 DIAGNOSIS — D508 Other iron deficiency anemias: Secondary | ICD-10-CM

## 2022-02-24 MED ORDER — SODIUM CHLORIDE 0.9 % IV SOLN: 500.0000 mL | Freq: Once | INTRAVENOUS | Status: DC

## 2022-02-24 NOTE — Progress Notes (Signed)
VS by CW. ?

## 2022-02-24 NOTE — Progress Notes (Signed)
History and Physical:  This patient presents for endoscopic testing for: Encounter Diagnosis  Name Primary?   Other iron deficiency anemia Yes    [facility] office visit 12/08/2021 with clinical details. Patient developed postoperative anemia that normalized after iron replacement.  When seen in consultation with [place], patient had normal hemoglobin and iron levels with elevated ferritin 2 months later repeat labs showed a mild decrease in hemoglobin to 12.4 with mildly decreased iron and iron saturation but persistently elevated ferritin.  No localizing GI symptoms.  Upper endoscopy recommended to rule out upper GI source of blood loss contributing to anemia (no stool heme testing has been done).  This patient had normal colonoscopy in 2019 and a normal Cologuard in 2022.  Patient is otherwise without complaints or active issues today.   Past Medical History: Past Medical History:  Diagnosis Date   Allergy    Anxiety    Arthritis    Blood transfusion without reported diagnosis    Cancer ([place])    Cataract    Depression      Past Surgical History: Past Surgical History:  Procedure Laterality Date   INGUINAL HERNIA REPAIR Right 09/18/2021   Procedure: OPEN RIGHT INGUINAL HERNIA REPAIR WITH MESH;  Surgeon: Kinsinger, Arta Bruce, MD;  Location: [facility];  Service: General;  Laterality: Right;   KNEE CARTILAGE SURGERY Right    PROSTATE SURGERY     ROTATOR CUFF REPAIR Right     Allergies: No Known Allergies  Outpatient Meds: Current Outpatient Medications  Medication Sig Dispense Refill   Biotin 5000 MCG CAPS Take 5,000 mcg by mouth daily.     Cholecalciferol (VITAMIN D) 50 MCG (2000 UT) tablet Take 2,000 Units by mouth daily.     fenofibrate 160 MG tablet Take 160 mg by mouth daily.     ferrous sulfate 325 (65 FE) MG tablet Take 1 tablet by mouth daily.     Misc Natural Products (GINSENG COMPLEX PO) Take 1 capsule by mouth daily.     Multiple Vitamin (MULTIVITAMIN WITH MINERALS) TABS  tablet Take 1 tablet by mouth daily.     Omega-3 Fatty Acids (FISH OIL) 1000 MG CAPS Take 1,000 mg by mouth daily.     Potassium Gluconate 2.5 MEQ TABS Take by mouth.     tetrahydrozoline 0.05 % ophthalmic solution Place 1 drop into both eyes 2 (two) times daily as needed (dry/irritated eyes).     vitamin E 1000 UNIT capsule Take 1,000 Units by mouth daily.     alprostadil (EDEX) 10 MCG injection 10 mcg by Intracavitary route as needed for erectile dysfunction. use no more than 3 times per week     CREATINE PO Take 1 tablet by mouth daily.     cyclobenzaprine (FLEXERIL) 10 MG tablet Take 1 tablet by mouth 3 (three) times daily as needed.     ibuprofen (ADVIL) 800 MG tablet Take 1 tablet (800 mg total) by mouth every 8 (eight) hours as needed. 30 tablet 0   NONFORMULARY OR COMPOUNDED ITEM Prostin 10 mcg/cc. 10 cc total. Inject 1 cc into penis once as directed prior to intercourse not more than every other day.     Saw Palmetto 450 MG CAPS Take 450 mg by mouth daily.     Vilazodone HCl (VIIBRYD) 40 MG TABS Take 20 mg by mouth daily.     Current Facility-Administered Medications  Medication Dose Route Frequency Provider Last Rate Last Admin   0.9 %  sodium chloride infusion  500  mL Intravenous Once Doran Stabler, MD          ___________________________________________________________________ Objective   Exam:  BP 138/85   Pulse 68   Temp 97.7 F (36.5 C)   Ht '5\' 7"'$  (1.702 m)   Wt 140 lb (63.5 kg)   SpO2 100%   BMI 21.93 kg/m   CV: RRR without murmur, S1/S2 Resp: clear to auscultation bilaterally, normal RR and effort noted GI: soft, no tenderness, with active bowel sounds.   Assessment: Encounter Diagnosis  Name Primary?   Other iron deficiency anemia Yes     Plan:  EGD  The benefits and risks of the planned procedure were described in detail with the patient or (when appropriate) their health care proxy.  Risks were outlined as including, but not limited to,  bleeding, infection, perforation, adverse medication reaction leading to cardiac or pulmonary decompensation, pancreatitis (if ERCP).  The limitation of incomplete mucosal visualization was also discussed.  No guarantees or warranties were given.    The patient is appropriate for an endoscopic procedure in the ambulatory setting.   - Wilfrid Lund, MD

## 2022-02-24 NOTE — Progress Notes (Signed)
Called to room to assist during endoscopic procedure.  Patient ID and intended procedure confirmed with present staff. Received instructions for my participation in the procedure from the performing physician.  

## 2022-02-24 NOTE — Op Note (Signed)
[facility] Patient Name: Jeff Romero Procedure Date: 02/24/2022 10:43 AM MRN: [id number] Endoscopist: Mallie Mussel [name] , MD Age: 61 Referring MD:  Date of Birth: Mar 17, 1961 Gender: Male Account #: [id number] Procedure:                Upper GI endoscopy Indications:              Unexplained iron deficiency anemia (mild with Hgb                            12.4 and iron 39 and elevated ferritin, but                            recurrent 2 months after stopping iron tablets)                           no localizing GI symptoms                           normal colonoscopy 2019, normal cologuard 2022 Medicines:                Monitored Anesthesia Care Procedure:                Pre-Anesthesia Assessment:                           - Prior to the procedure, a History and Physical                            was performed, and patient medications and                            allergies were reviewed. The patient's tolerance of                            previous anesthesia was also reviewed. The risks                            and benefits of the procedure and the sedation                            options and risks were discussed with the patient.                            All questions were answered, and informed consent                            was obtained. Prior Anticoagulants: The patient has                            taken no previous anticoagulant or antiplatelet                            agents. ASA Grade Assessment: II - A patient with  mild systemic disease. After reviewing the risks                            and benefits, the patient was deemed in                            satisfactory condition to undergo the procedure.                           After obtaining informed consent, the endoscope was                            passed under direct vision. Throughout the                            procedure, the patient's blood pressure, pulse,  and                            oxygen saturations were monitored continuously. The                            GIF [id number] #[id number] was introduced through the                            mouth, and advanced to the second part of duodenum.                            The upper GI endoscopy was accomplished without                            difficulty. The patient tolerated the procedure                            well. Scope In: Scope Out: Findings:                 The esophagus was normal.                           Patchy mild mucosal changes characterized by                            erythema and granularity were found in the gastric                            fundus and in the gastric body. Several biopsies                            were obtained on the greater curvature of the                            gastric body, on the lesser curvature of the                            gastric body, on  the greater curvature of the                            gastric antrum and on the lesser curvature of the                            gastric antrum with cold forceps for histology.                           The exam of the stomach was otherwise normal.                           The cardia and gastric fundus were normal on                            retroflexion.                           Normal mucosa was found in the entire duodenum.                            Biopsies for histology were taken with a cold                            forceps for evaluation of celiac disease. Complications:            No immediate complications. Estimated Blood Loss:     Estimated blood loss was minimal. Impression:               - Normal esophagus.                           - Erythematous and granular mucosa in the gastric                            fundus and gastric body.                           - Normal mucosa was found in the entire examined                            duodenum. Biopsied.                            - Several biopsies were obtained on the greater                            curvature of the gastric body, on the lesser                            curvature of the gastric body, on the greater                            curvature of the gastric antrum and on the lesser  curvature of the gastric antrum. Recommendation:           - Patient has a contact number available for                            emergencies. The signs and symptoms of potential                            delayed complications were discussed with the                            patient. Return to normal activities tomorrow.                            Written discharge instructions were provided to the                            patient.                           - Resume previous diet.                           - Continue present medications.                           - Await pathology results.                           - Return to primary care physician to monitor CBC                            and iron levels. If IDA persists after stopping                            iron tablets, refer to Hematology and obtain FOBT                            (and refer back to our office if positive). Marland Kitchen Bernis Schreur [name], MD 02/24/2022 11:10:23 AM This report has been signed electronically.

## 2022-02-24 NOTE — Progress Notes (Signed)
To pacu, VSS. Report to [email or web address] 

## 2022-02-24 NOTE — Patient Instructions (Signed)
YOU HAD AN ENDOSCOPIC PROCEDURE TODAY AT THE [facility]:   Refer to the procedure report that was given to you for any specific questions about what was found during the examination.  If the procedure report does not answer your questions, please call your gastroenterologist to clarify.  If you requested that your care partner not be given the details of your procedure findings, then the procedure report has been included in a sealed envelope for you to review at your convenience later.  YOU SHOULD EXPECT: Some feelings of bloating in the abdomen. Passage of more gas than usual.  Walking can help get rid of the air that was put into your GI tract during the procedure and reduce the bloating. If you had a lower endoscopy (such as a colonoscopy or flexible sigmoidoscopy) you may notice spotting of blood in your stool or on the toilet paper. If you underwent a bowel prep for your procedure, you may not have a normal bowel movement for a few days.  Please Note:  You might notice some irritation and congestion in your nose or some drainage.  This is from the oxygen used during your procedure.  There is no need for concern and it should clear up in a day or so.  SYMPTOMS TO REPORT IMMEDIATELY:    Following upper endoscopy (EGD)  Vomiting of blood or coffee ground material  New chest pain or pain under the shoulder blades  Painful or persistently difficult swallowing  New shortness of breath  Fever of 100F or higher  Black, tarry-looking stools  For urgent or emergent issues, a gastroenterologist can be reached at any hour by calling [phone]. Do not use MyChart messaging for urgent concerns.    DIET:  We do recommend a small meal at first, but then you may proceed to your regular diet.  Drink plenty of fluids but you should avoid alcoholic beverages for 24 hours.  ACTIVITY:  You should plan to take it easy for the rest of today and you should NOT DRIVE or use heavy machinery  until tomorrow (because of the sedation medicines used during the test).    FOLLOW UP: Our staff will call the number listed on your records 48-72 hours following your procedure to check on you and address any questions or concerns that you may have regarding the information given to you following your procedure. If we do not reach you, we will leave a message.  We will attempt to reach you two times.  During this call, we will ask if you have developed any symptoms of COVID 19. If you develop any symptoms (ie: fever, flu-like symptoms, shortness of breath, cough etc.) before then, please call [phone].  If you test positive for Covid 19 in the 2 weeks post procedure, please call and report this information to us.    If any biopsies were taken you will be contacted by phone or by letter within the next 1-3 weeks.  Please call us at [phone] if you have not heard about the biopsies in 3 weeks.    SIGNATURES/CONFIDENTIALITY: You and/or your care partner have signed paperwork which will be entered into your electronic medical record.  These signatures attest to the fact that that the information above on your After Visit Summary has been reviewed and is understood.  Full responsibility of the confidentiality of this discharge information lies with you and/or your care-partner. 

## 2022-02-25 ENCOUNTER — Telehealth: Payer: Self-pay | Admitting: Gastroenterology

## 2022-02-25 ENCOUNTER — Telehealth: Payer: Self-pay | Admitting: *Deleted

## 2022-02-25 NOTE — Telephone Encounter (Signed)
Called and spoke with patient. I informed him that his pathology results are not back and it will probably be next week before his results come back. Pt had questions about his anemia. Pt states that he wasn't anemic until he had surgery on his hernia. I informed him that his PCP should be following his repeat labs and if he is still anemic they would refer him to hematology. I informed pt that the reason for the procedure was to see if they could find anything GI related that could be causing his anemia. Pt knows to expect a call or MyChart message with his results. Pt verbalized understanding and had no concerns at the end of the call.

## 2022-02-25 NOTE — Telephone Encounter (Signed)
  Follow up Call-     02/24/2022   10:03 AM  Call back number  Post procedure Call Back phone  # [phone]  Permission to leave phone message Yes     Patient questions:  Do you have a fever, pain , or abdominal swelling? No. Pain Score  0 *  Have you tolerated food without any problems? Yes.    Have you been able to return to your normal activities? Yes.    Do you have any questions about your discharge instructions: Diet   No. Medications  No. Follow up visit  No.  Do you have questions or concerns about your Care? No.  Actions: * If pain score is 4 or above: No action needed, pain <4.

## 2022-02-25 NOTE — Telephone Encounter (Signed)
Patient called asked to speak with Vicie Mutters for path results.

## 2022-03-03 ENCOUNTER — Other Ambulatory Visit: Payer: Self-pay

## 2022-03-03 DIAGNOSIS — A048 Other specified bacterial intestinal infections: Secondary | ICD-10-CM

## 2022-03-03 MED ORDER — BISMUTH SUBSALICYLATE 262 MG PO TABS
524.0000 mg | ORAL_TABLET | Freq: Four times a day (QID) | ORAL | 0 refills | Status: AC
Start: 2022-03-03 — End: 2022-03-17

## 2022-03-03 MED ORDER — DOXYCYCLINE HYCLATE 100 MG PO CAPS: 100.0000 mg | ORAL_CAPSULE | Freq: Two times a day (BID) | ORAL | 0 refills | Status: AC

## 2022-03-03 MED ORDER — OMEPRAZOLE 20 MG PO CPDR: 20.0000 mg | DELAYED_RELEASE_CAPSULE | Freq: Two times a day (BID) | ORAL | 0 refills | Status: DC

## 2022-03-03 MED ORDER — METRONIDAZOLE 500 MG PO TABS: 500.0000 mg | ORAL_TABLET | Freq: Three times a day (TID) | ORAL | 0 refills | Status: AC

## 2022-03-05 ENCOUNTER — Encounter: Payer: Medicaid Other | Admitting: Gastroenterology

## 2022-04-01 ENCOUNTER — Telehealth: Payer: Self-pay

## 2022-04-01 NOTE — Telephone Encounter (Signed)
Called and spoke with patient and his wife. They reported that pt missed a few days of treatment so he just finished on 03/27/22. I informed pt that he will be due for the breath test about 3 weeks after finishing antibiotics but he also has to be off of Omeprazole 2 weeks prior to submitting test. Pt is aware that I will mail his reminder and hand written order that he needs to take to [facility] with him. Pt confirmed address on file. Pt verbalized understanding of all information and had no concerns at the end of the call.  Reminder letter, written urea breath test order, and [facility] location have been mailed to patient.

## 2022-04-01 NOTE — Telephone Encounter (Signed)
-----   Message from Yevette Edwards, RN sent at 03/03/2022  2:36 PM EDT ----- Regarding: Urea Breath test Urea breath test due 04/08/22 Mail order and lab reminder

## 2022-04-21 ENCOUNTER — Telehealth: Payer: Self-pay | Admitting: Gastroenterology

## 2022-04-21 NOTE — Telephone Encounter (Signed)
Inbound call from patient requesting to discuss how to do the breath test he is supposed to take. Please advise.

## 2022-04-21 NOTE — Telephone Encounter (Signed)
Returned call to patient. He confirmed that he received the reminder letter in the mail. I told pt that he will need to take the written order that was enclosed with the letter to the [facility] location that was provided to him. Pt has been advised that the breath test will be completed at [facility]. Pt is aware that he can make an appt if he wants, but one is not necessary. Pt has not been taking PPI. Pt will complete Urea breath test at his convenience. Pt verbalized understanding and had no concerns at the end of the call.

## 2022-06-02 ENCOUNTER — Other Ambulatory Visit (HOSPITAL_BASED_OUTPATIENT_CLINIC_OR_DEPARTMENT_OTHER): Payer: Self-pay

## 2024-01-20 ENCOUNTER — Other Ambulatory Visit: Payer: Self-pay

## 2024-01-20 ENCOUNTER — Emergency Department (HOSPITAL_COMMUNITY)

## 2024-01-20 ENCOUNTER — Observation Stay (HOSPITAL_COMMUNITY)
Admission: EM | Admit: 2024-01-20 | Discharge: 2024-01-22 | Disposition: A | Discharge status: Home / Self Care | Attending: Emergency Medicine | Admitting: Emergency Medicine

## 2024-01-20 ENCOUNTER — Encounter (HOSPITAL_COMMUNITY): Payer: Self-pay | Admitting: *Deleted

## 2024-01-20 DIAGNOSIS — R918 Other nonspecific abnormal finding of lung field: Principal | ICD-10-CM | POA: Insufficient documentation

## 2024-01-20 DIAGNOSIS — D75839 Thrombocytosis, unspecified: Secondary | ICD-10-CM | POA: Insufficient documentation

## 2024-01-20 DIAGNOSIS — E8809 Other disorders of plasma-protein metabolism, not elsewhere classified: Secondary | ICD-10-CM | POA: Diagnosis present

## 2024-01-20 DIAGNOSIS — R770 Abnormality of albumin: Secondary | ICD-10-CM | POA: Diagnosis not present

## 2024-01-20 DIAGNOSIS — D630 Anemia in neoplastic disease: Secondary | ICD-10-CM | POA: Diagnosis present

## 2024-01-20 DIAGNOSIS — Z8546 Personal history of malignant neoplasm of prostate: Secondary | ICD-10-CM | POA: Insufficient documentation

## 2024-01-20 DIAGNOSIS — Z8249 Family history of ischemic heart disease and other diseases of the circulatory system: Secondary | ICD-10-CM

## 2024-01-20 DIAGNOSIS — D72829 Elevated white blood cell count, unspecified: Secondary | ICD-10-CM | POA: Insufficient documentation

## 2024-01-20 DIAGNOSIS — F32A Depression, unspecified: Secondary | ICD-10-CM | POA: Diagnosis present

## 2024-01-20 DIAGNOSIS — J984 Other disorders of lung: Secondary | ICD-10-CM | POA: Diagnosis not present

## 2024-01-20 DIAGNOSIS — C3411 Malignant neoplasm of upper lobe, right bronchus or lung: Principal | ICD-10-CM | POA: Diagnosis present

## 2024-01-20 DIAGNOSIS — C7800 Secondary malignant neoplasm of unspecified lung: Secondary | ICD-10-CM | POA: Diagnosis present

## 2024-01-20 DIAGNOSIS — Z87891 Personal history of nicotine dependence: Secondary | ICD-10-CM | POA: Insufficient documentation

## 2024-01-20 DIAGNOSIS — Z832 Family history of diseases of the blood and blood-forming organs and certain disorders involving the immune mechanism: Secondary | ICD-10-CM

## 2024-01-20 DIAGNOSIS — R634 Abnormal weight loss: Secondary | ICD-10-CM | POA: Diagnosis not present

## 2024-01-20 DIAGNOSIS — Z1152 Encounter for screening for COVID-19: Secondary | ICD-10-CM | POA: Diagnosis not present

## 2024-01-20 DIAGNOSIS — E876 Hypokalemia: Secondary | ICD-10-CM | POA: Diagnosis present

## 2024-01-20 DIAGNOSIS — J439 Emphysema, unspecified: Secondary | ICD-10-CM | POA: Diagnosis present

## 2024-01-20 DIAGNOSIS — Z808 Family history of malignant neoplasm of other organs or systems: Secondary | ICD-10-CM | POA: Diagnosis not present

## 2024-01-20 DIAGNOSIS — R079 Chest pain, unspecified: Secondary | ICD-10-CM | POA: Diagnosis not present

## 2024-01-20 DIAGNOSIS — R053 Chronic cough: Secondary | ICD-10-CM | POA: Diagnosis present

## 2024-01-20 DIAGNOSIS — E785 Hyperlipidemia, unspecified: Secondary | ICD-10-CM | POA: Diagnosis present

## 2024-01-20 DIAGNOSIS — Z79899 Other long term (current) drug therapy: Secondary | ICD-10-CM | POA: Insufficient documentation

## 2024-01-20 DIAGNOSIS — R630 Anorexia: Secondary | ICD-10-CM | POA: Diagnosis not present

## 2024-01-20 DIAGNOSIS — F419 Anxiety disorder, unspecified: Secondary | ICD-10-CM | POA: Diagnosis present

## 2024-01-20 DIAGNOSIS — R590 Localized enlarged lymph nodes: Secondary | ICD-10-CM | POA: Insufficient documentation

## 2024-01-20 DIAGNOSIS — Z811 Family history of alcohol abuse and dependence: Secondary | ICD-10-CM | POA: Diagnosis not present

## 2024-01-20 DIAGNOSIS — J850 Gangrene and necrosis of lung: Secondary | ICD-10-CM | POA: Diagnosis present

## 2024-01-20 DIAGNOSIS — F1011 Alcohol abuse, in remission: Secondary | ICD-10-CM | POA: Diagnosis present

## 2024-01-20 DIAGNOSIS — D649 Anemia, unspecified: Secondary | ICD-10-CM | POA: Diagnosis not present

## 2024-01-20 DIAGNOSIS — R042 Hemoptysis: Secondary | ICD-10-CM | POA: Diagnosis present

## 2024-01-20 DIAGNOSIS — R059 Cough, unspecified: Secondary | ICD-10-CM | POA: Diagnosis present

## 2024-01-20 DIAGNOSIS — C342 Malignant neoplasm of middle lobe, bronchus or lung: Secondary | ICD-10-CM | POA: Diagnosis not present

## 2024-01-20 HISTORY — DX: Malignant neoplasm of prostate: C61

## 2024-01-20 HISTORY — DX: Malignant neoplasm of upper lobe, right bronchus or lung: C34.11

## 2024-01-20 LAB — BASIC METABOLIC PANEL WITH GFR
Anion gap: 13 (ref 5–15)
BUN: 10 mg/dL (ref 8–23)
CO2: 22 mmol/L (ref 22–32)
Calcium: 8.7 mg/dL — ABNORMAL LOW (ref 8.9–10.3)
Chloride: 99 mmol/L (ref 98–111)
Creatinine, Ser: 0.7 mg/dL (ref 0.61–1.24)
GFR, Estimated: >60 mL/min
Glucose, Bld: 100 mg/dL — ABNORMAL HIGH (ref 70–99)
Potassium: 3.9 mmol/L (ref 3.5–5.1)
Sodium: 134 mmol/L — ABNORMAL LOW (ref 135–145)

## 2024-01-20 LAB — CBC
HCT: 30.2 % — ABNORMAL LOW (ref 39.0–52.0)
Hemoglobin: 9.4 g/dL — ABNORMAL LOW (ref 13.0–17.0)
MCH: 25.8 pg — ABNORMAL LOW (ref 26.0–34.0)
MCHC: 31.1 g/dL (ref 30.0–36.0)
MCV: 83 fL (ref 80.0–100.0)
Platelets: 429 10*3/uL — ABNORMAL HIGH (ref 150–400)
RBC: 3.64 MIL/uL — ABNORMAL LOW (ref 4.22–5.81)
RDW: 13.7 % (ref 11.5–15.5)
WBC: 9.7 10*3/uL (ref 4.0–10.5)
nRBC: 0 % (ref 0.0–0.2)

## 2024-01-20 LAB — TROPONIN I (HIGH SENSITIVITY)
Troponin I (High Sensitivity): 12 ng/L (ref <18)
Troponin I (High Sensitivity): 11 ng/L (ref <18)

## 2024-01-20 MED ORDER — ACETAMINOPHEN 500 MG PO TABS
1000.0000 mg | ORAL_TABLET | Freq: Once | ORAL | Status: AC
  Administered 2024-01-20: 1000 mg via ORAL
  Filled 2024-01-20: qty 2

## 2024-01-20 MED ORDER — IOHEXOL 350 MG/ML SOLN
50.0000 mL | Freq: Once | INTRAVENOUS | Status: AC | PRN
  Administered 2024-01-20: 50 mL via INTRAVENOUS

## 2024-01-20 MED ORDER — IBUPROFEN 400 MG PO TABS
600.0000 mg | ORAL_TABLET | Freq: Once | ORAL | Status: AC
  Administered 2024-01-20: 600 mg via ORAL
  Filled 2024-01-20: qty 1

## 2024-01-20 MED ORDER — SODIUM CHLORIDE 0.9 % IV SOLN
100.0000 mg | Freq: Once | INTRAVENOUS | Status: AC
  Administered 2024-01-20: 100 mg via INTRAVENOUS
  Filled 2024-01-20: qty 100

## 2024-01-20 NOTE — ED Triage Notes (Signed)
 Cough x 3 months.  Soreness in ribs and back from coughing.  No chills or sob.

## 2024-01-20 NOTE — ED Provider Triage Note (Signed)
 Emergency Medicine Provider Triage Evaluation Note  Jeff Romero , a 63 y.o. male  was evaluated in triage.  Pt complains of dry cough for 2 and half months that has not improved with antibiotic, steroid and is now causing some shortness of breath and chest pain.  Patient contributes chest pain to excessive coughing.  Does have significant smoking history.  Notes recent 15 pound weight loss.  Review of Systems  Positive: CP, SOB, cough Negative: fever  Physical Exam  BP 127/73   Pulse 96   Temp 97.8 F (36.6 C)   Resp 17   SpO2 99%  Gen:   Awake, no distress   Resp:  Normal effort  MSK:   Moves extremities without difficulty  Other:    Medical Decision Making  Medically screening exam initiated at 1:28 PM.  Appropriate orders placed.  Jeff Romero was informed that the remainder of the evaluation will be completed by another provider, this initial triage assessment does not replace that evaluation, and the importance of remaining in the ED until their evaluation is complete.     Jeff Heron, PA-C 01/20/24 1329

## 2024-01-20 NOTE — ED Provider Notes (Signed)
 [place] EMERGENCY DEPARTMENT AT [facility] Provider Note   CSN: [id number] Arrival date & time: 01/20/24  1214     History  Chief Complaint  Patient presents with   Cough   HPI  Jeff Romero is a 63 y.o. male with past medical history anxiety and hyperlipidemia presents due to persistent cough.  Patient states has been coughing for about 3 months with no fevers.  Reports mix of dry cough and wet cough with white sputum.  Endorses dullness of pain to the right upper chest as well as right side of the chest that is reproducible when he touches it.  Denies any orthopnea.  He was previously placed on 2 bursts of steroids with minimal relief.  He endorses losing up to 15 pounds of weight over the last 3 months, attributes this to decreased p.o. intake from his anxiety.  Denies history of being immunocompromised, denies any IV drug use, is housed.   Cough      Home Medications Prior to Admission medications   Medication Sig Start Date End Date Taking? Authorizing Provider  alprostadil (EDEX) 10 MCG injection 10 mcg by Intracavitary route as needed for erectile dysfunction. use no more than 3 times per week    [provider]  Biotin 5000 MCG CAPS Take 5,000 mcg by mouth daily.    [provider]  Cholecalciferol (VITAMIN D) 50 MCG (2000 UT) tablet Take 2,000 Units by mouth daily.    [provider]  CREATINE PO Take 1 tablet by mouth daily.    [provider]  cyclobenzaprine (FLEXERIL) 10 MG tablet Take 1 tablet by mouth 3 (three) times daily as needed. 07/26/19   [provider]  fenofibrate 160 MG tablet Take 160 mg by mouth daily.    [provider]  ferrous sulfate 325 (65 FE) MG tablet Take 1 tablet by mouth daily.    [provider]  ibuprofen (ADVIL) 800 MG tablet Take 1 tablet (800 mg total) by mouth every 8 (eight) hours as needed. 09/18/21   Kinsinger, De Blanch, MD  Misc Natural Products [facility]  COMPLEX PO) Take 1 capsule by mouth daily.    [provider]  Multiple Vitamin (MULTIVITAMIN WITH MINERALS) TABS tablet Take 1 tablet by mouth daily.    [provider]  NONFORMULARY OR COMPOUNDED ITEM Prostin 10 mcg/cc. 10 cc total. Inject 1 cc into penis once as directed prior to intercourse not more than every other day. 01/08/22   [provider]  Omega-3 Fatty Acids (FISH OIL) 1000 MG CAPS Take 1,000 mg by mouth daily.    [provider]  omeprazole (PRILOSEC) 20 MG capsule Take 1 capsule (20 mg total) by mouth 2 (two) times daily before a meal for 14 days. 03/03/22 03/17/22  Charlie Pitter III, MD  Potassium Gluconate 2.5 MEQ TABS Take by mouth.    [provider]  Saw Palmetto 450 MG CAPS Take 450 mg by mouth daily.    [provider]  tetrahydrozoline 0.05 % ophthalmic solution Place 1 drop into both eyes 2 (two) times daily as needed (dry/irritated eyes).    [provider]  Vilazodone HCl (VIIBRYD) 40 MG TABS Take 20 mg by mouth daily.    [provider]  vitamin E 1000 UNIT capsule Take 1,000 Units by mouth daily.    [provider]      Allergies    Patient has no known allergies.    Review of  Systems   Review of Systems  Respiratory:  Positive for cough.     Physical Exam Updated Vital Signs BP (!) 144/79 (BP Location: Left Arm)   Pulse (!) 106   Temp (!) 100.8 F (38.2 C) (Oral)   Resp 20   SpO2 99%  Physical Exam Vitals and nursing note reviewed.  Constitutional:      General: He is not in acute distress.    Appearance: He is well-developed.  HENT:     Head: Normocephalic and atraumatic.     Mouth/Throat:     Mouth: Mucous membranes are moist.     Pharynx: No oropharyngeal exudate or posterior oropharyngeal erythema.  Eyes:     Conjunctiva/sclera: Conjunctivae normal.  Cardiovascular:     Rate and Rhythm: Normal rate and regular rhythm.     Heart sounds: No murmur heard. Pulmonary:      Effort: Pulmonary effort is normal. No respiratory distress.     Breath sounds: Normal breath sounds. No wheezing or rales.  Abdominal:     General: Abdomen is flat. There is no distension.     Palpations: Abdomen is soft.     Tenderness: There is no abdominal tenderness. There is no guarding or rebound.  Musculoskeletal:        General: No swelling.     Cervical back: Neck supple.     Right lower leg: No edema.     Left lower leg: No edema.  Skin:    General: Skin is warm and dry.     Capillary Refill: Capillary refill takes less than 2 seconds.  Neurological:     Mental Status: He is alert.  Psychiatric:        Mood and Affect: Mood normal.     ED Results / Procedures / Treatments   Labs (all labs ordered are listed, but only abnormal results are displayed) Labs Reviewed  BASIC METABOLIC PANEL WITH GFR - Abnormal; Notable for the following components:      Result Value   Sodium 134 (*)    Glucose, Bld 100 (*)    Calcium 8.7 (*)    All other components within normal limits  CBC - Abnormal; Notable for the following components:   RBC 3.64 (*)    Hemoglobin 9.4 (*)    HCT 30.2 (*)    MCH 25.8 (*)    Platelets 429 (*)    All other components within normal limits  RESP PANEL BY RT-PCR (RSV, FLU A&B, COVID)  RVPGX2  TROPONIN I (HIGH SENSITIVITY)  TROPONIN I (HIGH SENSITIVITY)    EKG None  Radiology CT Chest W Contrast Result Date: 01/20/2024 CLINICAL DATA:  History of cough and abnormal consolidation in the right upper lobe. EXAM: CT CHEST WITH CONTRAST TECHNIQUE: Multidetector CT imaging of the chest was performed during intravenous contrast administration. RADIATION DOSE REDUCTION: This exam was performed according to the departmental dose-optimization program which includes automated exposure control, adjustment of the mA and/or kV according to patient size and/or use of iterative reconstruction technique. CONTRAST:  50mL OMNIPAQUE IOHEXOL 350 MG/ML SOLN  COMPARISON:  Chest x-ray from earlier in the same day. FINDINGS: Cardiovascular: Thoracic aorta demonstrates atherosclerotic calcifications. No large central pulmonary embolus is identified although timing was not performed for embolus evaluation. Some mild attenuation of the right pulmonary arterial branches is seen no coronary calcifications are noted. No cardiac enlargement is noted. Mediastinum/Nodes: Thoracic inlet is within normal limits. The esophagus is within normal limits. Right hilar and suprahilar adenopathy  is identified with central necrosis highly suspicious for metastatic involvement. The largest of these is noted in the right perihilar region measuring 3.8 x 2.4 cm best seen on image number 80 of series 3. A precarinal node with central necrosis is noted measuring 2.0 cm in short axis. The esophagus as visualized is within normal limits. Lungs/Pleura: Left lung shows no acute infiltrate or effusion. Mild emphysematous changes are noted in the apex. Similar emphysematous changes are noted in the right apex. A large centrally necrotic mass lesion is identified which measures at least 4.9 x 4.7 cm in transverse and AP dimensions respectively. This along with the necrotic adenopathy centrally is consistent with primary pulmonary neoplasm till proven otherwise. Surrounding micro nodular changes are noted likely representing some local spread. Some branching centrally necrotic density is noted particularly in the right upper lobe peripherally suspicious for neoplastic involvement the bronchial tree. Upper Abdomen: Simple cyst is noted in the midportion of the right lobe of the liver. No other focal abnormality is noted. Musculoskeletal: No chest wall abnormality. No acute or significant osseous findings. IMPRESSION: Changes consistent with large right upper lobe necrotic mass lesion with associated necrotic hilar and mediastinal adenopathy. There is also some suggestion of ingrowth into the bronchial tree  of the right upper lobe. Peripheral nodular changes are noted also suspicious for local invasion. Referral to multi disciplinary pulmonary team is recommended. Tissue sampling is recommended as well. Aortic Atherosclerosis (ICD10-I70.0) and Emphysema (ICD10-J43.9). Electronically Signed   By: Violeta Grey M.D.   On: 01/20/2024 22:54   DG Chest 2 View Result Date: 01/20/2024 CLINICAL DATA:  Coughing for 3 months EXAM: CHEST - 2 VIEW COMPARISON:  None Available. FINDINGS: Area of consolidation along the inferior right upper lobe. Question enlargement of the right lung hilum as well. No pneumothorax, effusion or edema. Normal cardiopericardial silhouette. IMPRESSION: Consolidation in the inferior right upper lobe with some fullness of the right lung hilum. Please correlate for any clinical evidence of infection or infiltrate. However with the history of 3 months, a different pathology would be possible and would recommend a contrast CT scan of the chest to further delineate etiology 1 clinically appropriate. Electronically Signed   By: Adrianna Horde M.D.   On: 01/20/2024 14:58    Procedures Procedures    Medications Ordered in ED Medications  doxycycline (VIBRAMYCIN) 100 mg in sodium chloride 0.9 % 250 mL IVPB (has no administration in time range)  acetaminophen (TYLENOL) tablet 1,000 mg (1,000 mg Oral Given 01/20/24 2242)  ibuprofen (ADVIL) tablet 600 mg (600 mg Oral Given 01/20/24 2241)  iohexol (OMNIPAQUE) 350 MG/ML injection 50 mL (50 mLs Intravenous Contrast Given 01/20/24 2241)    ED Course/ Medical Decision Making/ A&P                                 Medical Decision Making Amount and/or Complexity of Data Reviewed Radiology: ordered.  Risk OTC drugs. Prescription drug management.   Upon arrival, patient is afebrile and hemodynamically stable in no acute distress.  He is saturating well on room air, not tachypneic.  Lungs are clear to auscultation bilaterally.  Differential includes  pneumonia, empyema, pneumothorax, pleurisy, costochondritis, space-occupying lesion, ACS, PE, amongst other diagnoses.  Workup obtained through triage demonstrates CBC with hemoglobin 9.4 (1 year ago, hemoglobin 12).  BMP with no severe electrolyte derangements, no AKI.  1st and 2nd troponin negative.  I personally interpreted patient's EKG,  which demonstrated sinus tachycardia with rate of 105, normal PR interval, QRS duration, and QTc.  No ST elevations or depressions, no T wave inversions in concordant leads, overall no acute ischemic changes.  No dysrhythmias.  I personally interpreted patient's chest x-ray, which demonstrated right upper lobe consolidation concerning for pneumonia.  Will obtain CT imaging given prolonged presence of symptoms.  While pending workup, patient did have frank hemoptysis in the ED.  CT imaging did result with right upper lobe necrotic mass.  Patient also did become febrile up to 100.8.  I ordered doxycycline for atypical coverage.  I spoke with patient and his wife at bedside about the unfortunate findings on patient's CT.  We discussed admission versus close outpatient follow-up, with the former preferred.  I believe this is reasonable given findings in addition to fever and tachycardia.  Handoff was given to admitting team.  Patient seen in conjunction with Dr. Nolia Baumgartner, who agreed with the above work-up and plan of care.        Final Clinical Impression(s) / ED Diagnoses Final diagnoses:  Lung mass    Rx / DC Orders ED Discharge Orders     None         Lorain Robson, MD 01/20/24 4098    Hershel Los, MD 01/20/24 2332

## 2024-01-21 ENCOUNTER — Inpatient Hospital Stay (HOSPITAL_COMMUNITY)

## 2024-01-21 ENCOUNTER — Encounter (HOSPITAL_COMMUNITY): Payer: Self-pay | Admitting: Internal Medicine

## 2024-01-21 ENCOUNTER — Telehealth: Payer: Self-pay | Admitting: Critical Care Medicine

## 2024-01-21 ENCOUNTER — Encounter (HOSPITAL_COMMUNITY)
Admission: EM | Disposition: A | Discharge status: Home / Self Care | Payer: Self-pay | Source: Home / Self Care | Attending: Emergency Medicine

## 2024-01-21 DIAGNOSIS — R079 Chest pain, unspecified: Secondary | ICD-10-CM | POA: Diagnosis not present

## 2024-01-21 DIAGNOSIS — C342 Malignant neoplasm of middle lobe, bronchus or lung: Secondary | ICD-10-CM | POA: Diagnosis not present

## 2024-01-21 DIAGNOSIS — R590 Localized enlarged lymph nodes: Secondary | ICD-10-CM

## 2024-01-21 DIAGNOSIS — D649 Anemia, unspecified: Secondary | ICD-10-CM | POA: Diagnosis not present

## 2024-01-21 DIAGNOSIS — R042 Hemoptysis: Secondary | ICD-10-CM | POA: Diagnosis not present

## 2024-01-21 DIAGNOSIS — J439 Emphysema, unspecified: Secondary | ICD-10-CM | POA: Diagnosis not present

## 2024-01-21 DIAGNOSIS — Z87891 Personal history of nicotine dependence: Secondary | ICD-10-CM

## 2024-01-21 DIAGNOSIS — R918 Other nonspecific abnormal finding of lung field: Secondary | ICD-10-CM

## 2024-01-21 DIAGNOSIS — R634 Abnormal weight loss: Secondary | ICD-10-CM | POA: Diagnosis not present

## 2024-01-21 DIAGNOSIS — R630 Anorexia: Secondary | ICD-10-CM

## 2024-01-21 DIAGNOSIS — J984 Other disorders of lung: Secondary | ICD-10-CM

## 2024-01-21 LAB — RESP PANEL BY RT-PCR (RSV, FLU A&B, COVID)  RVPGX2
Influenza A by PCR: NEGATIVE
Influenza B by PCR: NEGATIVE
Resp Syncytial Virus by PCR: NEGATIVE
SARS Coronavirus 2 by RT PCR: NEGATIVE

## 2024-01-21 LAB — CBC
HCT: 27.9 % — ABNORMAL LOW (ref 39.0–52.0)
Hemoglobin: 9.3 g/dL — ABNORMAL LOW (ref 13.0–17.0)
MCH: 26.6 pg (ref 26.0–34.0)
MCHC: 33.3 g/dL (ref 30.0–36.0)
MCV: 79.9 fL — ABNORMAL LOW (ref 80.0–100.0)
Platelets: 403 10*3/uL — ABNORMAL HIGH (ref 150–400)
RBC: 3.49 MIL/uL — ABNORMAL LOW (ref 4.22–5.81)
RDW: 13.7 % (ref 11.5–15.5)
WBC: 6.7 10*3/uL (ref 4.0–10.5)
nRBC: 0 % (ref 0.0–0.2)

## 2024-01-21 LAB — BASIC METABOLIC PANEL WITH GFR
Anion gap: 10 (ref 5–15)
BUN: 9 mg/dL (ref 8–23)
CO2: 24 mmol/L (ref 22–32)
Calcium: 9 mg/dL (ref 8.9–10.3)
Chloride: 102 mmol/L (ref 98–111)
Creatinine, Ser: 0.74 mg/dL (ref 0.61–1.24)
GFR, Estimated: >60 mL/min (ref >60)
Glucose, Bld: 117 mg/dL — ABNORMAL HIGH (ref 70–99)
Potassium: 3.4 mmol/L — ABNORMAL LOW (ref 3.5–5.1)
Sodium: 136 mmol/L (ref 135–145)

## 2024-01-21 LAB — MAGNESIUM: Magnesium: 1.8 mg/dL (ref 1.7–2.4)

## 2024-01-21 LAB — HIV ANTIBODY (ROUTINE TESTING W REFLEX): HIV Screen 4th Generation wRfx: NONREACTIVE

## 2024-01-21 SURGERY — BRONCHOSCOPY, FLEXIBLE
Anesthesia: General

## 2024-01-21 MED ORDER — SODIUM CHLORIDE 0.9 % IV SOLN
INTRAVENOUS | Status: AC | PRN
  Administered 2024-01-21: 500 mL via INTRAVENOUS

## 2024-01-21 MED ORDER — ONDANSETRON HCL 4 MG PO TABS
4.0000 mg | ORAL_TABLET | Freq: Four times a day (QID) | ORAL | Status: DC | PRN
Start: 2024-01-21 — End: 2024-01-22

## 2024-01-21 MED ORDER — EPINEPHRINE 1 MG/10ML IJ SOSY
PREFILLED_SYRINGE | INTRAMUSCULAR | Status: AC
  Filled 2024-01-21: qty 10

## 2024-01-21 MED ORDER — SUGAMMADEX SODIUM 200 MG/2ML IV SOLN
INTRAVENOUS | Status: DC | PRN
  Administered 2024-01-21: 200 mg via INTRAVENOUS

## 2024-01-21 MED ORDER — ROCURONIUM BROMIDE 10 MG/ML (PF) SYRINGE
PREFILLED_SYRINGE | INTRAVENOUS | Status: DC | PRN
  Administered 2024-01-21: 40 mg via INTRAVENOUS

## 2024-01-21 MED ORDER — OXYCODONE HCL 5 MG PO TABS: 5.0000 mg | ORAL_TABLET | Freq: Once | ORAL | Status: DC | PRN

## 2024-01-21 MED ORDER — GUAIFENESIN-CODEINE 100-10 MG/5ML PO SOLN
5.0000 mL | ORAL | Status: DC | PRN
  Administered 2024-01-21 (×2): 5 mL via ORAL
  Filled 2024-01-21 (×3): qty 5

## 2024-01-21 MED ORDER — FENTANYL CITRATE (PF) 250 MCG/5ML IJ SOLN
INTRAMUSCULAR | Status: DC | PRN
  Administered 2024-01-21 (×2): 50 ug via INTRAVENOUS

## 2024-01-21 MED ORDER — ACETAMINOPHEN 650 MG RE SUPP: 650.0000 mg | Freq: Four times a day (QID) | RECTAL | Status: DC | PRN

## 2024-01-21 MED ORDER — UMECLIDINIUM BROMIDE 62.5 MCG/ACT IN AEPB
1.0000 | INHALATION_SPRAY | Freq: Every day | RESPIRATORY_TRACT | Status: DC
  Administered 2024-01-22: 1 via RESPIRATORY_TRACT
  Filled 2024-01-21: qty 7

## 2024-01-21 MED ORDER — PHENYLEPHRINE 80 MCG/ML (10ML) SYRINGE FOR IV PUSH (FOR BLOOD PRESSURE SUPPORT)
PREFILLED_SYRINGE | INTRAVENOUS | Status: DC | PRN
  Administered 2024-01-21 (×2): 160 ug via INTRAVENOUS

## 2024-01-21 MED ORDER — GUAIFENESIN-CODEINE 100-10 MG/5ML PO SOLN
10.0000 mL | ORAL | Status: DC | PRN
  Administered 2024-01-22 (×3): 10 mL via ORAL
  Filled 2024-01-21 (×3): qty 10

## 2024-01-21 MED ORDER — BENZONATATE 100 MG PO CAPS
100.0000 mg | ORAL_CAPSULE | Freq: Three times a day (TID) | ORAL | Status: DC
Start: 2024-01-21 — End: 2024-01-22
  Administered 2024-01-21 – 2024-01-22 (×3): 100 mg via ORAL
  Filled 2024-01-21 (×3): qty 1

## 2024-01-21 MED ORDER — DEXAMETHASONE SODIUM PHOSPHATE 10 MG/ML IJ SOLN
INTRAMUSCULAR | Status: DC | PRN
  Administered 2024-01-21: 10 mg via INTRAVENOUS

## 2024-01-21 MED ORDER — PROPOFOL 10 MG/ML IV BOLUS
INTRAVENOUS | Status: DC | PRN
  Administered 2024-01-21: 140 mg via INTRAVENOUS

## 2024-01-21 MED ORDER — SODIUM CHLORIDE (PF) 0.9 % IJ SOLN
PREFILLED_SYRINGE | INTRAMUSCULAR | Status: DC | PRN
  Administered 2024-01-21: 6 mL

## 2024-01-21 MED ORDER — IBUPROFEN 200 MG PO TABS
600.0000 mg | ORAL_TABLET | Freq: Four times a day (QID) | ORAL | Status: DC | PRN
  Administered 2024-01-21 – 2024-01-22 (×4): 600 mg via ORAL
  Filled 2024-01-21 (×4): qty 3

## 2024-01-21 MED ORDER — ONDANSETRON HCL 4 MG/2ML IJ SOLN
INTRAMUSCULAR | Status: DC | PRN
  Administered 2024-01-21: 4 mg via INTRAVENOUS

## 2024-01-21 MED ORDER — LIDOCAINE 2% (20 MG/ML) 5 ML SYRINGE
INTRAMUSCULAR | Status: DC | PRN
  Administered 2024-01-21: 60 mg via INTRAVENOUS

## 2024-01-21 MED ORDER — ACETAMINOPHEN 325 MG PO TABS: 650.0000 mg | ORAL_TABLET | Freq: Four times a day (QID) | ORAL | Status: DC | PRN

## 2024-01-21 MED ORDER — PROPOFOL 500 MG/50ML IV EMUL
INTRAVENOUS | Status: DC | PRN
  Administered 2024-01-21: 200 ug/kg/min via INTRAVENOUS

## 2024-01-21 MED ORDER — PANTOPRAZOLE SODIUM 40 MG PO TBEC
40.0000 mg | DELAYED_RELEASE_TABLET | Freq: Every day | ORAL | Status: DC
  Administered 2024-01-21 – 2024-01-22 (×2): 40 mg via ORAL
  Filled 2024-01-21 (×2): qty 1

## 2024-01-21 MED ORDER — DM-GUAIFENESIN ER 30-600 MG PO TB12
1.0000 | ORAL_TABLET | Freq: Two times a day (BID) | ORAL | Status: DC
  Administered 2024-01-21 – 2024-01-22 (×3): 1 via ORAL
  Filled 2024-01-21 (×4): qty 1

## 2024-01-21 MED ORDER — FENTANYL CITRATE (PF) 100 MCG/2ML IJ SOLN: 25.0000 ug | INTRAMUSCULAR | Status: DC | PRN

## 2024-01-21 MED ORDER — OXYCODONE HCL 5 MG/5ML PO SOLN: 5.0000 mg | Freq: Once | ORAL | Status: DC | PRN

## 2024-01-21 MED ORDER — SODIUM CHLORIDE 0.9 % IV SOLN: 12.5000 mg | INTRAVENOUS | Status: DC | PRN

## 2024-01-21 MED ORDER — SENNOSIDES-DOCUSATE SODIUM 8.6-50 MG PO TABS: 1.0000 | ORAL_TABLET | Freq: Every evening | ORAL | Status: DC | PRN

## 2024-01-21 MED ORDER — AMISULPRIDE (ANTIEMETIC) 5 MG/2ML IV SOLN: 10.0000 mg | Freq: Once | INTRAVENOUS | Status: DC | PRN

## 2024-01-21 MED ORDER — ONDANSETRON HCL 4 MG/2ML IJ SOLN: 4.0000 mg | Freq: Four times a day (QID) | INTRAMUSCULAR | Status: DC | PRN

## 2024-01-21 MED ORDER — FENTANYL CITRATE (PF) 100 MCG/2ML IJ SOLN
INTRAMUSCULAR | Status: AC
  Filled 2024-01-21: qty 2

## 2024-01-21 SURGICAL SUPPLY — 34 items
ADAPTER VALVE BIOPSY EBUS (MISCELLANEOUS) IMPLANT
BALLN FOR EBUS SCOPE (BALLOONS) ×2 IMPLANT
BALLOON FOR EBUS SCOPE (BALLOONS) ×2 IMPLANT
BRUSH CYTOL CELLEBRITY 1.5X140 (MISCELLANEOUS) IMPLANT
CANISTER SUCT 3000ML PPV (MISCELLANEOUS) ×2 IMPLANT
CONT SPEC 4OZ CLIKSEAL STRL BL (MISCELLANEOUS) ×2 IMPLANT
COVER BACK TABLE 60X90IN (DRAPES) ×2 IMPLANT
FORCEPS BIOP RJ4 1.8 (CUTTING FORCEPS) IMPLANT
GAUZE SPONGE 4X4 12PLY STRL (GAUZE/BANDAGES/DRESSINGS) ×2 IMPLANT
GLOVE SURG SS PI 6.0 STRL IVOR (GLOVE) ×2 IMPLANT
GOWN STRL REUS W/ TWL LRG LVL3 (GOWN DISPOSABLE) ×2 IMPLANT
KIT CLEAN ENDO COMPLIANCE (KITS) ×4 IMPLANT
KIT TURNOVER KIT B (KITS) ×2 IMPLANT
MARKER SKIN DUAL TIP RULER LAB (MISCELLANEOUS) ×2 IMPLANT
NDL ASPIRATION VIZISHOT 19G (NEEDLE) IMPLANT
NDL ASPIRATION VIZISHOT 21G (NEEDLE) IMPLANT
NEEDLE ASPIRATION VIZISHOT 19G (NEEDLE) IMPLANT
NEEDLE ASPIRATION VIZISHOT 21G (NEEDLE) IMPLANT
NS IRRIG 1000ML POUR BTL (IV SOLUTION) ×2 IMPLANT
OIL SILICONE PENTAX (PARTS (SERVICE/REPAIRS)) ×2 IMPLANT
PAD ARMBOARD 7.5X6 YLW CONV (MISCELLANEOUS) ×4 IMPLANT
STOPCOCK 4 WAY LG BORE MALE ST (IV SETS) ×2 IMPLANT
SYR 20ML ECCENTRIC (SYRINGE) ×4 IMPLANT
SYR 20ML LL LF (SYRINGE) ×4 IMPLANT
SYR 50ML SLIP (SYRINGE) IMPLANT
SYR 5ML LL (SYRINGE) ×2 IMPLANT
SYR 5ML LUER SLIP (SYRINGE) ×2 IMPLANT
TOWEL GREEN STERILE FF (TOWEL DISPOSABLE) ×2 IMPLANT
TRAP SPECIMEN MUCOUS 40CC (MISCELLANEOUS) IMPLANT
TUBE CONNECTING 20X1/4 (TUBING) ×4 IMPLANT
UNDERPAD 30X30 (UNDERPADS AND DIAPERS) ×2 IMPLANT
VALVE BIOPSY SINGLE USE (MISCELLANEOUS) ×2 IMPLANT
VALVE SUCTION BRONCHIO DISP (MISCELLANEOUS) ×2 IMPLANT
WATER STERILE IRR 1000ML POUR (IV SOLUTION) ×2 IMPLANT

## 2024-01-21 NOTE — Consult Note (Signed)
 NAME:  Jeff Romero, MRN:  [id number], DOB:  [birth year], LOS: 1 ADMISSION DATE:  01/20/2024, CONSULTATION DATE:  4/18 REFERRING MD:  Fronie Jewett, CHIEF COMPLAINT:  lung mass   History of Present Illness:  Jeff Romero is a 63 y/o gentleman with a history of prostate cancer who presented with several months of coughing. He came to the ED due to pain in his chest and back on the R side that has been ongoing for months and an episode of hemoptysis last night. He had one previous episode of hemoptysis several weeks ago, but at baseline coughs up white sputum. He has some DOE. He has lost about 10# over the last 2 months and has had poor appetite. He assumed his appetite was due to depression. He quit vaping about 1 month ago after 10 years and had previously smoked up to 1ppd x 30 years. No family history of lung cancer, but his father had bone cancer.  No AC or AP meds.  CT in the ED demonstrated a large lung mass for which PCCM was consulted.   Pertinent  Medical History  Prostate cancer- resected, no chemo   Significant Hospital Events: Including procedures, antibiotic start and stop dates in addition to other pertinent events   4/18 admitted  Interim History / Subjective:    Objective   Blood pressure 125/78, pulse 91, temperature 97.8 F (36.6 C), temperature source Oral, resp. rate 16, height 5\' 7"  (1.702 m), weight 61.4 kg, SpO2 100%.        Intake/Output Summary (Last 24 hours) at 01/21/2024 0850 Last data filed at 01/21/2024 0148 Gross per 24 hour  Intake 250.2 ml  Output --  Net 250.2 ml   Filed Weights   01/21/24 0300  Weight: 61.4 kg    Examination: General: middle aged man sitting up in bed in NAD HENT: [place]/AT, eyes anicteric Lungs: decreased R anterior breath sounds, no wheezing. No conversational dyspnea.  Cardiovascular: s1S2, RRR Abdomen: soft, NT Extremities: good muscle mass, no edema, no clubbing Neuro: awake, alert, normal speech and answering questions  appropriately Skin: warm, dry, no rashes  K+ 3.4 BUN 9 Cr 0.74 WBC 6.7 H/H 9.3/27.9 Platelets 403 CT chest personally reviewed: large RUL lung mass with bulky R hilar adenopathy, liver cyst.    Resolved Hospital Problem list      Assessment & Plan:  Large R lung mass, hilar adenopathy-- very concerning for lung cancer -Discussed my concerns with him and recommendation for bronch with biopsy; may be able to do this with endobronchial biopsy, but discussed EBUS may be necessary as well.  -keep NPO this morning  Pathologic weight loss -encourage protein supplements  Emphysema on CT scan -recommend OP PFTs -can trial LAMA to see if it improves DOE  Anemia; concerned this is due to chronic disease; worsening since 2023. Thrombocytosis, likely reactive Hypokalemia -per primary   Best Practice (right click and "Reselect all SmartList Selections" daily)   Per primary  Labs   CBC: Recent Labs  Lab 01/20/24 1329 01/21/24 0510  WBC 9.7 6.7  HGB 9.4* 9.3*  HCT 30.2* 27.9*  MCV 83.0 79.9*  PLT 429* 403*    Basic Metabolic Panel: Recent Labs  Lab 01/20/24 1329 01/21/24 0510  NA 134* 136  K 3.9 3.4*  CL 99 102  CO2 22 24  GLUCOSE 100* 117*  BUN 10 9  CREATININE 0.70 0.74  CALCIUM 8.7* 9.0  MG  --  1.8   GFR: Estimated Creatinine Clearance: 83.1  mL/min (by C-G formula based on SCr of 0.74 mg/dL). Recent Labs  Lab 01/20/24 1329 01/21/24 0510  WBC 9.7 6.7    Liver Function Tests: No results for input(s): "AST", "ALT", "ALKPHOS", "BILITOT", "PROT", "ALBUMIN" in the last 168 hours. No results for input(s): "LIPASE", "AMYLASE" in the last 168 hours. No results for input(s): "AMMONIA" in the last 168 hours.  ABG No results found for: "PHART", "PCO2ART", "PO2ART", "HCO3", "TCO2", "ACIDBASEDEF", "O2SAT"   Coagulation Profile: No results for input(s): "INR", "PROTIME" in the last 168 hours.  Cardiac Enzymes: No results for input(s): "CKTOTAL", "CKMB",  "CKMBINDEX", "TROPONINI" in the last 168 hours.  HbA1C: No results found for: "HGBA1C"  CBG: No results for input(s): "GLUCAP" in the last 168 hours.  Review of Systems:   Review of Systems  Constitutional:  Positive for weight loss. Negative for chills and fever.  HENT:  Negative for congestion and sore throat.        Epistaxis x 2  Respiratory:  Positive for cough, sputum production and shortness of breath.   Cardiovascular:  Negative for leg swelling.       R sided chest and back pain  Gastrointestinal:  Negative for blood in stool, diarrhea, nausea and vomiting.  Genitourinary:  Negative for hematuria.  Musculoskeletal:  Negative for joint pain and myalgias.  Skin:  Negative for rash.  Neurological:  Negative for weakness.     Past Medical History:  He,  has a past medical history of Allergy, Anxiety, Arthritis, Blood transfusion without reported diagnosis, Cataract, Depression, and Prostate cancer (HCC).   Surgical History:   Past Surgical History:  Procedure Laterality Date   INGUINAL HERNIA REPAIR Right 09/18/2021   Procedure: OPEN RIGHT INGUINAL HERNIA REPAIR WITH MESH;  Surgeon: Kinsinger, Alphonso Aschoff, MD;  Location: [facility];  Service: General;  Laterality: Right;   KNEE CARTILAGE SURGERY Right    PROSTATE SURGERY     ROTATOR CUFF REPAIR Right      Social History:   reports that he quit smoking about 9 years ago. His smoking use included e-cigarettes. He has never used smokeless tobacco. He reports current alcohol use of about 6.0 standard drinks of alcohol per week. He reports that he does not use drugs.   Family History:  His family history includes Alcohol abuse in his mother; Bone cancer in his father; Cirrhosis in his mother; Hypertension in his brother and sister; Sickle cell anemia in his mother. There is no history of Colon cancer, Esophageal cancer, Rectal cancer, or Stomach cancer.   Allergies No Known Allergies   Home Medications  Prior to Admission  medications   Medication Sig Start Date End Date Taking? Authorizing Provider  albuterol (VENTOLIN HFA) 108 (90 Base) MCG/ACT inhaler Inhale 1 puff into the lungs every 6 (six) hours as needed for wheezing or shortness of breath. 12/23/23  Yes [provider]  dextromethorphan  (DELSYM ) 30 MG/5ML liquid Take 30 mg by mouth 2 (two) times daily as needed for cough.   Yes [provider]  omeprazole  (PRILOSEC) 20 MG capsule Take 1 capsule (20 mg total) by mouth 2 (two) times daily before a meal for 14 days. Patient taking differently: Take 20 mg by mouth daily. 03/03/22 01/20/25 Yes Danis, Cordelia Dessert, MD  rosuvastatin (CRESTOR) 10 MG tablet Take 10 mg by mouth daily. 12/23/23  Yes [provider]  tetrahydrozoline 0.05 % ophthalmic solution Place 1 drop into both eyes daily.   Yes [provider]  Biotin 5000  MCG CAPS Take 5,000 mcg by mouth daily. Patient not taking: Reported on 01/21/2024    [provider]  Cholecalciferol (VITAMIN D) 50 MCG (2000 UT) tablet Take 2,000 Units by mouth daily. Patient not taking: Reported on 01/21/2024    [provider]  CREATINE PO Take 1 tablet by mouth daily. Patient not taking: Reported on 01/21/2024    [provider]  ferrous sulfate 325 (65 FE) MG tablet Take 1 tablet by mouth daily. Patient not taking: Reported on 01/21/2024    [provider]  Misc Natural Products (GINSENG COMPLEX PO) Take 1 capsule by mouth daily. Patient not taking: Reported on 01/21/2024    [provider]  Multiple Vitamin (MULTIVITAMIN WITH MINERALS) TABS tablet Take 1 tablet by mouth daily. Patient not taking: Reported on 01/21/2024    [provider]  Omega-3 Fatty Acids (FISH OIL) 1000 MG CAPS Take 1,000 mg by mouth daily. Patient not taking: Reported on 01/21/2024    [provider]  Potassium Gluconate 2.5 MEQ TABS Take by mouth. Patient not taking: Reported on 01/21/2024    [provider]  Saw Palmetto 450 MG CAPS Take 450 mg by mouth daily. Patient not taking: Reported on 01/21/2024    [provider]  vitamin E 1000 UNIT capsule Take 1,000 Units by mouth daily. Patient not taking: Reported on 01/21/2024    [provider]     Critical care time:      Joesph Mussel, DO 01/21/24 8:50 AM [facility]  For contact information, see Amion. If no response to pager, please call PCCM consult pager. After hours, 7PM- 7AM, please call Elink.

## 2024-01-21 NOTE — Progress Notes (Signed)
 Patient seen and examined in the morning rounds.  I also examined patient in the afternoon for discharge readiness.  In brief, 63 year old with history of smoking, previous history of prostate cancer presented with about 3 months of unrelenting cough, episodic hemoptysis, now having chest pain due to cough.  In the emergency room, he was found to have large necrotic right upper lobe tumor with mediastinal lymphadenopathy.  He was admitted early morning hours by nighttime hospitalist. I called pulmonology consult, patient was able to be taken to bronchoscopy and biopsy today.  Examined patient after procedure for discharge readiness.  Patient continues to complain of cough, now he is having some hemoptysis that is expected after the procedure.  Plan: Monitor postprocedure.  Some of this is expected after needle biopsy. Mucinex  twice daily scheduled Tessalon  Perles 100 mg 3 times daily scheduled Hycodan 10 mg every 4 hours as needed Tylenol  and Motrin  for pain Monitor today in the hospital. As per pulmonary recommendation, go home when symptoms controlled and they will schedule follow-up to discuss about biopsy.   Patient's wife and children updated at the bedside.    Same-day admit.  No charge visit.

## 2024-01-21 NOTE — Anesthesia Procedure Notes (Signed)
 Procedure Name: Intubation Date/Time: 01/21/2024 10:13 AM  Performed by: Roslynn Waddell LABOR, CRNAPre-anesthesia Checklist: Patient identified, Emergency Drugs available, Suction available and Patient being monitored Patient Re-evaluated:Patient Re-evaluated prior to induction Oxygen Delivery Method: Circle System Utilized Preoxygenation: Pre-oxygenation with 100% oxygen Induction Type: IV induction Ventilation: Mask ventilation without difficulty Laryngoscope Size: Mac and 4 Grade View: Grade I Tube type: Oral Number of attempts: 1 Airway Equipment and Method: Stylet Placement Confirmation: ETT inserted through vocal cords under direct vision, positive ETCO2 and breath sounds checked- equal and bilateral Secured at: 22 cm Tube secured with: Tape Dental Injury: Teeth and Oropharynx as per pre-operative assessment  Comments: Atraumatic induction/intubation. Dentition and oral mucosa as per preop.

## 2024-01-21 NOTE — Anesthesia Postprocedure Evaluation (Signed)
 Anesthesia Post Note  Patient: Jeff Romero  Procedure(s) Performed: BRONCHOSCOPY, FLEXIBLE BRONCHOSCOPY, WITH BIOPSY BRONCHOSCOPY, WITH BRUSH BIOPSY     Patient location during evaluation: PACU Anesthesia Type: General Level of consciousness: awake and alert Pain management: pain level controlled Vital Signs Assessment: post-procedure vital signs reviewed and stable Respiratory status: spontaneous breathing, nonlabored ventilation and respiratory function stable Cardiovascular status: stable and blood pressure returned to baseline Anesthetic complications: no  No notable events documented.  Last Vitals:  Vitals:   01/21/24 1130 01/21/24 1204  BP: 128/89 138/89  Pulse: (!) 104 (!) 111  Resp: 18 20  Temp: 37 C   SpO2: 97% 100%    Last Pain:  Vitals:   01/21/24 1130  TempSrc:   PainSc: 0-No pain                 Debby FORBES Like

## 2024-01-21 NOTE — Telephone Encounter (Signed)
 OP pulmonology follow up requested.  Joesph Mussel, DO 01/21/24 10:46 AM [facility]  For contact information, see Amion. If no response to pager, please call PCCM consult pager. After hours, 7PM- 7AM, please call Elink.

## 2024-01-21 NOTE — Transfer of Care (Signed)
 Immediate Anesthesia Transfer of Care Note  Patient: Zorian Gunderman  Procedure(s) Performed: BRONCHOSCOPY, FLEXIBLE BRONCHOSCOPY, WITH BIOPSY BRONCHOSCOPY, WITH BRUSH BIOPSY  Patient Location: PACU  Anesthesia Type:General  Level of Consciousness: awake and alert   Airway & Oxygen Therapy: Patient Spontanous Breathing and Patient connected to face mask oxygen  Post-op Assessment: Report given to RN and Post -op Vital signs reviewed and stable  Post vital signs: Reviewed and stable  Last Vitals:  Vitals Value Taken Time  BP 131/75 01/21/24 1100  Temp 36.7 C 01/21/24 1100  Pulse 109 01/21/24 1105  Resp 15 01/21/24 1105  SpO2 96 % 01/21/24 1105  Vitals shown include unfiled device data.  Last Pain:  Vitals:   01/21/24 0930  TempSrc: Temporal  PainSc: 0-No pain         Complications: No notable events documented.

## 2024-01-21 NOTE — H&P (Addendum)
 History and Physical    Patient: Jeff Romero:[id number] DOB: 10/11/1960 DOA: 01/20/2024 DOS: the patient was seen and examined on 01/21/2024 PCP: [facility]  Patient coming from: Home  Chief Complaint:  Chief Complaint  Patient presents with   Cough   HPI: Jeff Romero is a 63 y.o. male with medical history significant for distant h/o prostate cancer, alcohol abuse quit 3 months ago and vaping quit 2 months ago who presents with severe nonstop cough for the last 3 months.  The patient has been to urgent care and has had at least 2 courses of steroids.  He has been on antibiotics and muscle relaxers.  He has had an inhaler but none of these things have helped the cough.  The cough is productive of some white mucus.  He coughs all the time all day and all night.  He has also had trouble with pain in his upper right chest which radiates into his back.  That has been going on for about a month.  The patient thought it was just because he has been coughing so much.  He coughed up blood once about a week ago and then 1 more time here in the ED.  The patient is a previous smoker and smoked for 40+ years but he quit 10 years ago.  He has been vaping since that time.  He also reports hair loss and has had no appetite for about a month.  He lost approximately 10 pounds in the last 2 and half months since this coughing became constant.  The patient's wife is a respiratory therapist. The patient was previously an everyday heavy drinker but quit drinking 3 months ago.  He quit vaping approximately 1-1/2 months ago because of his cough.   Review of Systems: As mentioned in the history of present illness. All other systems reviewed and are negative. Past Medical History:  Diagnosis Date   Allergy    Anxiety    Arthritis    Blood transfusion without reported diagnosis    Cancer [facility])    Cataract    Depression    Past Surgical History:  Procedure Laterality Date   INGUINAL HERNIA REPAIR  Right 09/18/2021   Procedure: OPEN RIGHT INGUINAL HERNIA REPAIR WITH MESH;  Surgeon: Kinsinger, Alphonso Aschoff, MD;  Location: [facility]S;  Service: General;  Laterality: Right;   KNEE CARTILAGE SURGERY Right    PROSTATE SURGERY     ROTATOR CUFF REPAIR Right    Social History:  reports that he quit smoking about 9 years ago. His smoking use included e-cigarettes. He has never used smokeless tobacco. He reports current alcohol use of about 6.0 standard drinks of alcohol per week. He reports that he does not use drugs.  No Known Allergies  Family History  Problem Relation Age of Onset   Hypertension Sister    Hypertension Brother    Colon cancer Neg Hx    Esophageal cancer Neg Hx    Rectal cancer Neg Hx    Stomach cancer Neg Hx     Prior to Admission medications   Medication Sig Start Date End Date Taking? Authorizing Provider  albuterol (VENTOLIN HFA) 108 (90 Base) MCG/ACT inhaler Inhale 1 puff into the lungs every 6 (six) hours as needed for wheezing or shortness of breath. 12/23/23  Yes [provider]  Misc Natural Products (GINSENG COMPLEX PO) Take 1 capsule by mouth daily.   Yes [provider]  Multiple Vitamin (MULTIVITAMIN WITH MINERALS) TABS tablet  Take 1 tablet by mouth daily.   Yes [provider]  omeprazole  (PRILOSEC) 20 MG capsule Take 1 capsule (20 mg total) by mouth 2 (two) times daily before a meal for 14 days. Patient taking differently: Take 20 mg by mouth daily. 03/03/22 01/20/25 Yes Danis, Cordelia Dessert, MD  rosuvastatin (CRESTOR) 10 MG tablet Take 10 mg by mouth daily. 12/23/23  Yes [provider]  tetrahydrozoline 0.05 % ophthalmic solution Place 1 drop into both eyes daily.   Yes [provider]  Biotin 5000 MCG CAPS Take 5,000 mcg by mouth daily. Patient not taking: Reported on 01/21/2024    [provider]  Cholecalciferol (VITAMIN D) 50 MCG (2000 UT) tablet Take 2,000 Units by mouth daily. Patient not taking: Reported  on 01/21/2024    [provider]  CREATINE PO Take 1 tablet by mouth daily. Patient not taking: Reported on 01/21/2024    [provider]  cyclobenzaprine (FLEXERIL) 10 MG tablet Take 1 tablet by mouth 3 (three) times daily as needed. 07/26/19   [provider]  ferrous sulfate 325 (65 FE) MG tablet Take 1 tablet by mouth daily. Patient not taking: Reported on 01/21/2024    [provider]  Omega-3 Fatty Acids (FISH OIL) 1000 MG CAPS Take 1,000 mg by mouth daily. Patient not taking: Reported on 01/21/2024    [provider]  Potassium Gluconate 2.5 MEQ TABS Take by mouth. Patient not taking: Reported on 01/21/2024    [provider]  Saw Palmetto 450 MG CAPS Take 450 mg by mouth daily. Patient not taking: Reported on 01/21/2024    [provider]  vitamin E 1000 UNIT capsule Take 1,000 Units by mouth daily. Patient not taking: Reported on 01/21/2024    [provider]    Physical Exam: Vitals:   01/20/24 1223 01/20/24 1710 01/20/24 2224  BP: 127/73 119/69 (!) 144/79  Pulse: 96 (!) 116 (!) 106  Resp: 17 (!) 26 20  Temp: 97.8 F (36.6 C) 99.1 F (37.3 C) (!) 100.8 F (38.2 C)  TempSrc:   Oral  SpO2: 99% 100% 99%   Physical Exam:  General: No acute distress, well developed, well nourished HEENT: Normocephalic, atraumatic, PERRL Cardiovascular: Normal rate and rhythm. Distal pulses intact. Pulmonary: Normal pulmonary effort, No wheezing or rales, prolonged exp. Gastrointestinal: Nondistended abdomen, soft, non-tender, normoactive bowel sounds Musculoskeletal:Normal ROM, no lower ext edema Skin: Skin is warm and dry. Neuro: No focal deficits noted, AAOx3. PSYCH: Attentive and cooperative  Data Reviewed:  Results for orders placed or performed during the hospital encounter of 01/20/24 (from the past 24 hours)  Basic metabolic panel     Status: Abnormal   Collection Time: 01/20/24  1:29 PM  Result Value Ref Range    Sodium 134 (L) 135 - 145 mmol/L   Potassium 3.9 3.5 - 5.1 mmol/L   Chloride 99 98 - 111 mmol/L   CO2 22 22 - 32 mmol/L   Glucose, Bld 100 (H) 70 - 99 mg/dL   BUN 10 8 - 23 mg/dL   Creatinine, Ser 8.46 0.61 - 1.24 mg/dL   Calcium 8.7 (L) 8.9 - 10.3 mg/dL   GFR, Estimated >96 >29 mL/min   Anion gap 13 5 - 15  CBC     Status: Abnormal   Collection Time: 01/20/24  1:29 PM  Result Value Ref Range   WBC 9.7 4.0 - 10.5 K/uL   RBC 3.64 (L) 4.22 - 5.81 MIL/uL   Hemoglobin  9.4 (L) 13.0 - 17.0 g/dL   HCT 63.8 (L) 75.6 - 43.3 %   MCV 83.0 80.0 - 100.0 fL   MCH 25.8 (L) 26.0 - 34.0 pg   MCHC 31.1 30.0 - 36.0 g/dL   RDW 29.5 18.8 - 41.6 %   Platelets 429 (H) 150 - 400 K/uL   nRBC 0.0 0.0 - 0.2 %  Troponin I (High Sensitivity)     Status: None   Collection Time: 01/20/24  1:29 PM  Result Value Ref Range   Troponin I (High Sensitivity) 12 <18 ng/L  Troponin I (High Sensitivity)     Status: None   Collection Time: 01/20/24  3:35 PM  Result Value Ref Range   Troponin I (High Sensitivity) 11 <18 ng/L   CTA chest IMPRESSION: Changes consistent with large right upper lobe necrotic mass lesion with associated necrotic hilar and mediastinal adenopathy. There is also some suggestion of ingrowth into the bronchial tree of the right upper lobe. Peripheral nodular changes are noted also suspicious for local invasion. Referral to multi disciplinary pulmonary team is recommended. Tissue sampling is recommended as well.    Assessment and Plan: 1.  Abnormal CT - necrotic right upper lobe mass lesion with local invasion and adenopathy - Pulmonology consult in a.m. for possible biopsy - Pain control and cough control - so far Ibuprofen  has helped a great deal.  Will add Robitussin with codeine . - NPO in am  2. Anemia - - guaiac stool - Start Protonix  empirically - Ibuprofen  has really helped the patient's pain.  If he is guaiac negative consider restarting the ibuprofen .   Advance Care  Planning:   Code Status: Full Code  The patient names his wife is a surrogate decision maker and he wants to be full code.  Consults: none  Family Communication: Wife at bedside  Severity of Illness: The appropriate patient status for this patient is INPATIENT. Inpatient status is judged to be reasonable and necessary in order to provide the required intensity of service to ensure the patient's safety. The patient's presenting symptoms, physical exam findings, and initial radiographic and laboratory data in the context of their chronic comorbidities is felt to place them at high risk for further clinical deterioration. Furthermore, it is not anticipated that the patient will be medically stable for discharge from the hospital within 2 midnights of admission.   * I certify that at the point of admission it is my clinical judgment that the patient will require inpatient hospital care spanning beyond 2 midnights from the point of admission due to high intensity of service, high risk for further deterioration and high frequency of surveillance required.*  Author: Willadean Hark, MD 01/21/2024 12:53 AM  For on call review [email or web address].

## 2024-01-21 NOTE — Plan of Care (Signed)

## 2024-01-21 NOTE — Op Note (Signed)
 Video Bronchoscopy Procedure Note  Date of Operation: 01/21/2024  Pre-op Diagnosis: R lung mass, hilar adenopathy  Post-op Diagnosis: Same  Surgeon: Leita SHAUNNA Gaskins, DO  Assistants: none  Anesthesia: general anesthesia   Meds Given: per [facility]  Operation: Flexible video fiberoptic bronchoscopy and biopsies.  Estimated Blood Loss: <10cc  Complications: none noted  Indications and History: Jeff Romero is 63 y.o. with history of tobacco abuse, cough x several months.  Recommendation was to perform video fiberoptic bronchoscopy with biopsies. The risks, benefits, complications, treatment options and expected outcomes were discussed with the patient.  The possibilities of pneumothorax, pneumonia, reaction to medication, pulmonary aspiration, perforation of a viscus, bleeding, failure to diagnose a condition and creating a complication requiring transfusion or operation were discussed with the patient who freely signed the consent.    Description of Procedure: The patient was seen in the Preoperative Area, was examined and was deemed appropriate to proceed.  The patient was taken to endoscopy, identified as Jeff Romero and the procedure verified as Flexible Video Fiberoptic Bronchoscopy.  A Time Out was held and the above information confirmed.   General anesthesia was initiated as indicated above. The video fiberoptic bronchoscope was introduced via the ETT and a general inspection was performed which showed normal distal trachea, normal main carina.The L side was inspected. The LLL, Lingular and LUL airways were normal. There was a R mainstem mass mostly occluding the LUL airway but patent BI and RML, RLL bronchi.   Endobronchial biopsoes were taken from the mass. Mild bleeding controlled with 1:10000 epi topically. Brush x 2 performed at the end of the procedure. Clot remained on the mass and bleeding was controlled at the termination of the procedure The patient tolerated the procedure well.  The bronchoscope was removed. There were no obvious complications.        Samples: 1. Endobronchial brushings from R mainstem 2.  Endobronchial forceps biopsies from R mainstem   Plans:  We will review the cytology, pathology and microbiology results with the patient when they become available.    OK to resume previous diet after he awakens from anesthesia.  Patient's wife was called and updated after the procedure.  Leita SHAUNNA Gaskins, DO 01/21/24 10:42 AM [facility]  For contact information, see Amion. If no response to pager, please call PCCM consult pager. After hours, 7PM- 7AM, please call Elink.

## 2024-01-21 NOTE — Anesthesia Preprocedure Evaluation (Addendum)
 Anesthesia Evaluation  Patient identified by MRN, date of birth, ID band Patient awake    Reviewed: Allergy & Precautions, NPO status , Patient's Chart, lab work & pertinent test results  History of Anesthesia Com

## 2024-01-22 DIAGNOSIS — J984 Other disorders of lung: Secondary | ICD-10-CM | POA: Diagnosis not present

## 2024-01-22 DIAGNOSIS — D75839 Thrombocytosis, unspecified: Secondary | ICD-10-CM | POA: Diagnosis not present

## 2024-01-22 DIAGNOSIS — R918 Other nonspecific abnormal finding of lung field: Secondary | ICD-10-CM | POA: Diagnosis not present

## 2024-01-22 DIAGNOSIS — D649 Anemia, unspecified: Secondary | ICD-10-CM | POA: Diagnosis not present

## 2024-01-22 DIAGNOSIS — R053 Chronic cough: Secondary | ICD-10-CM

## 2024-01-22 DIAGNOSIS — J439 Emphysema, unspecified: Secondary | ICD-10-CM | POA: Diagnosis not present

## 2024-01-22 DIAGNOSIS — R634 Abnormal weight loss: Secondary | ICD-10-CM | POA: Diagnosis not present

## 2024-01-22 LAB — COMPREHENSIVE METABOLIC PANEL WITH GFR
ALT: 17 U/L (ref 0–44)
AST: 20 U/L (ref 15–41)
Albumin: 2.2 g/dL — ABNORMAL LOW (ref 3.5–5.0)
Alkaline Phosphatase: 51 U/L (ref 38–126)
Anion gap: 13 (ref 5–15)
BUN: 19 mg/dL (ref 8–23)
CO2: 24 mmol/L (ref 22–32)
Calcium: 8.8 mg/dL — ABNORMAL LOW (ref 8.9–10.3)
Chloride: 99 mmol/L (ref 98–111)
Creatinine, Ser: 0.85 mg/dL (ref 0.61–1.24)
GFR, Estimated: >60 mL/min (ref >60)
Glucose, Bld: 109 mg/dL — ABNORMAL HIGH (ref 70–99)
Potassium: 3.8 mmol/L (ref 3.5–5.1)
Sodium: 136 mmol/L (ref 135–145)
Total Bilirubin: 0.4 mg/dL (ref 0.0–1.2)
Total Protein: 6.7 g/dL (ref 6.5–8.1)

## 2024-01-22 LAB — CBC WITH DIFFERENTIAL/PLATELET
Abs Immature Granulocytes: 0.05 10*3/uL (ref 0.00–0.07)
Basophils Absolute: 0 10*3/uL (ref 0.0–0.1)
Basophils Relative: 0 %
Eosinophils Absolute: 0 10*3/uL (ref 0.0–0.5)
Eosinophils Relative: 0 %
HCT: 27.9 % — ABNORMAL LOW (ref 39.0–52.0)
Hemoglobin: 9 g/dL — ABNORMAL LOW (ref 13.0–17.0)
Immature Granulocytes: 0 %
Lymphocytes Relative: 12 %
Lymphs Abs: 1.6 10*3/uL (ref 0.7–4.0)
MCH: 25.9 pg — ABNORMAL LOW (ref 26.0–34.0)
MCHC: 32.3 g/dL (ref 30.0–36.0)
MCV: 80.2 fL (ref 80.0–100.0)
Monocytes Absolute: 1.2 10*3/uL — ABNORMAL HIGH (ref 0.1–1.0)
Monocytes Relative: 9 %
Neutro Abs: 11 10*3/uL — ABNORMAL HIGH (ref 1.7–7.7)
Neutrophils Relative %: 79 %
Platelets: 437 10*3/uL — ABNORMAL HIGH (ref 150–400)
RBC: 3.48 MIL/uL — ABNORMAL LOW (ref 4.22–5.81)
RDW: 13.7 % (ref 11.5–15.5)
WBC: 14 10*3/uL — ABNORMAL HIGH (ref 4.0–10.5)
nRBC: 0 % (ref 0.0–0.2)

## 2024-01-22 LAB — PHOSPHORUS: Phosphorus: 3.2 mg/dL (ref 2.5–4.6)

## 2024-01-22 LAB — MAGNESIUM: Magnesium: 1.7 mg/dL (ref 1.7–2.4)

## 2024-01-22 LAB — OCCULT BLOOD X 1 CARD TO LAB, STOOL: Fecal Occult Bld: NEGATIVE

## 2024-01-22 MED ORDER — ACETAMINOPHEN 325 MG PO TABS: 650.0000 mg | ORAL_TABLET | Freq: Four times a day (QID) | ORAL | 0 refills | Status: DC | PRN

## 2024-01-22 MED ORDER — IBUPROFEN 600 MG PO TABS: 600.0000 mg | ORAL_TABLET | Freq: Four times a day (QID) | ORAL | 0 refills | Status: DC | PRN

## 2024-01-22 MED ORDER — SENNOSIDES-DOCUSATE SODIUM 8.6-50 MG PO TABS: 1.0000 | ORAL_TABLET | Freq: Every evening | ORAL | 0 refills | Status: DC | PRN

## 2024-01-22 MED ORDER — ONDANSETRON HCL 4 MG PO TABS: 4.0000 mg | ORAL_TABLET | Freq: Four times a day (QID) | ORAL | 0 refills | Status: DC | PRN

## 2024-01-22 MED ORDER — BENZONATATE 100 MG PO CAPS: 100.0000 mg | ORAL_CAPSULE | Freq: Three times a day (TID) | ORAL | 0 refills | Status: DC | PRN

## 2024-01-22 MED ORDER — MAGNESIUM SULFATE 2 GM/50ML IV SOLN
2.0000 g | Freq: Once | INTRAVENOUS | Status: AC
  Administered 2024-01-22: 2 g via INTRAVENOUS
  Filled 2024-01-22: qty 50

## 2024-01-22 MED ORDER — GUAIFENESIN-CODEINE 100-10 MG/5ML PO SOLN: 10.0000 mL | ORAL | 0 refills | Status: DC | PRN

## 2024-01-22 MED ORDER — PANTOPRAZOLE SODIUM 40 MG PO TBEC
40.0000 mg | DELAYED_RELEASE_TABLET | Freq: Every day | ORAL | 0 refills | Status: DC
Start: 2024-01-23 — End: 2024-03-14

## 2024-01-22 MED ORDER — UMECLIDINIUM BROMIDE 62.5 MCG/ACT IN AEPB: 1.0000 | INHALATION_SPRAY | Freq: Every day | RESPIRATORY_TRACT | 0 refills | Status: DC

## 2024-01-22 NOTE — Plan of Care (Signed)

## 2024-01-22 NOTE — Plan of Care (Signed)

## 2024-01-22 NOTE — Consult Note (Signed)
   NAME:  Kraven Calk, MRN:  [id number], DOB:  19-Feb-1961, LOS: 2 ADMISSION DATE:  01/20/2024, CONSULTATION DATE:  4/18 REFERRING MD:  Fronie Jewett, CHIEF COMPLAINT:  lung mass   History of Present Illness:  Mr. Buckner is a 63 y/o gentleman with a history of prostate cancer who presented with several months of coughing. He came to the ED due to pain in his chest and back on the R side that has been ongoing for months and an episode of hemoptysis last night. He had one previous episode of hemoptysis several weeks ago, but at baseline coughs up white sputum. He has some DOE. He has lost about 10# over the last 2 months and has had poor appetite. He assumed his appetite was due to depression. He quit vaping about 1 month ago after 10 years and had previously smoked up to 1ppd x 30 years. No family history of lung cancer, but his father had bone cancer.  No AC or AP meds.  CT in the ED demonstrated a large lung mass for which PCCM was consulted.   Pertinent  Medical History  Prostate cancer- resected, no chemo   Significant Hospital Events: Including procedures, antibiotic start and stop dates in addition to other pertinent events   4/18 admitted, bronch with R bronchial biopsies  Interim History / Subjective:  Scratchy throat today from procedure. Stopped coughing up blood yesterday. Cough is slightly improved.  Objective   Blood pressure 112/68, pulse 86, temperature 97.9 F (36.6 C), temperature source Oral, resp. rate 18, height 5\' 7"  (1.702 m), weight 61.4 kg, SpO2 98%.        Intake/Output Summary (Last 24 hours) at 01/22/2024 1029 Last data filed at 01/21/2024 2216 Gross per 24 hour  Intake 240 ml  Output 160 ml  Net 80 ml   Filed Weights   01/21/24 0300  Weight: 61.4 kg    Examination: General: middle aged man sitting up in bed in NAD HENT: [place]/AT, eyes anicteric Lungs: R anterior decreased breath sounds, no adventitial breath sounds, breathing comfortably on RA Cardiovascular:  S1S2, RRR Abdomen: soft, NT Extremities: no edema, or cyanosis Neuro: awake, alert, moving all extremities Skin: warm, dry, no rashes  Path pending  Resolved Hospital Problem list      Assessment & Plan:  Large R lung mass, hilar adenopathy-- very concerning for lung cancer. Endobronchial biopsies performed 4/18.  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  Pathologic weight loss -protein supplements  Emphysema on CT scan -recommend OP PFTs -trial of LAMA to see if this helps DOE  PCCM will be available as needed. Stable for d/c from my standpoint. Rest of workup will be OP.   Best Practice (right click and "Reselect all SmartList Selections" daily)   Per primary     Joesph Mussel, DO 01/22/24 10:34 AM [facility]  For contact information, see Amion. If no response to pager, please call PCCM consult pager. After hours, 7PM- 7AM, please call Elink.

## 2024-01-22 NOTE — Discharge Summary (Signed)
 Physician Discharge Summary   Patient: Jeff Romero MRN: [id number] DOB: May 26, 1961  Admit date:     01/20/2024  Discharge date: 01/22/24  Discharge Physician: Aura Leeds. DO   PCP: [name]   Recommendations at discharge:   Follow-up with PCP within 1 to 2 weeks repeat CBC, CMP, mag, Phos within 1 week Repeat chest x-ray in 3 to 6 weeks Follow-up with pulmonary outpatient setting for biopsy results and further workup for emphysema and COPD  Discharge Diagnoses: Principal Problem:   Cavitating mass in right upper lung lobe Active Problems:   Mass of right lung  Resolved Problems:   * No resolved hospital problems. [facility] Course: The patient is a 63 year old African-American male with a past medical history significant for predominantly distant history of prostate cancer, alcohol abuse as well as history of vaping quit 2 months ago who presents with a nonstop cough. He presented with chest and back pain on the right side that have been going on for months and had episode of hemoptysis.  He has had some dyspnea on exertion and has lost about 10 pounds in the last 2 months and poor appetite which she attributed to.  Further workup was initiated and was found to have a large lung mass and PCCM was consulted and he underwent bronchoscopy.  Bronchoscopy samples were taken and there was concern for lung cancer.  He was treated with antitussives for his cough pulmonary recommended outpatient follow-up with referral to oncology, MRI brain and PET scan if his path results test positive.  Given that he is well pulmonary recommends outpatient follow-up and discharging on a LAMA and recommended PCP follow-up and pulmonary follow-up within the coming weeks evaluate with outpatient PFTs as well.  Assessment and Plan:  Abnormal CT - necrotic right upper lobe mass lesion with local invasion and adenopathy Chronic cough likely secondary to lung cancer - Pulmonology consult and patient  underwent bronchoscopy with brushings done and biopsies. - Pain control and cough control - so far Ibuprofen  has helped a great deal.  Will add Robitussin with codeine . - Pulmonary recommends following up on the biopsy results in outpatient setting recommends discharge at this time given that he stable and will have outpatient follow-up further evaluation for emphysema and will get outpatient PFTs.  He is going be discharged on a LAMA to see if this helps his dyspnea on exertion.   Normocytic Anemia: - guaiac stool Negative - Start Protonix  empirically - Ibuprofen  has really helped the patient's pain.  If he is guaiac negative consider restarting the ibuprofen . -Hgb/Hct trend:  Recent Labs  Lab 01/20/24 1329 01/21/24 0510 01/22/24 1223  HGB 9.4* 9.3* 9.0*  HCT 30.2* 27.9* 27.9*  MCV 83.0 79.9* 80.2  - Anemia panel in outpatient setting and continue monitor for signs of symptom bleeding and repeat CBC within 1 week  Leukocytosis: Likely reactive after his bronch.  No signs of infection.  Continue monitor and trend repeat CBC within 1 week  Thrombocytosis: Mild and likely reactive. Plt Count went from 403 -> 437. CTM and Trend and repeat CBC w/in 1 week  Hypoalbuminemia: Patient's Albumin is now 2.2. CTM and Trend and repeat CMP in the AM    Consultants: Pulmonary  Procedures performed: Bronchoscopy   Disposition: Home Diet recommendation:  Discharge Diet Orders (From admission, onward)     Start     Ordered   01/22/24 0000  Diet - low sodium heart healthy  01/22/24 1342           Regular diet DISCHARGE MEDICATION: Allergies as of 01/22/2024   No Known Allergies      Medication List     STOP taking these medications    CREATINE PO   Delsym  30 MG/5ML liquid Generic drug: dextromethorphan    omeprazole  20 MG capsule Commonly known as: PRILOSEC   vitamin E 1000 UNIT capsule       TAKE these medications    acetaminophen  325 MG tablet Commonly known  as: TYLENOL  Take 2 tablets (650 mg total) by mouth every 6 (six) hours as needed for mild pain (pain score 1-3) (or Fever >/= 101).   albuterol  108 (90 Base) MCG/ACT inhaler Commonly known as: VENTOLIN  HFA Inhale 1 puff into the lungs every 6 (six) hours as needed for wheezing or shortness of breath.   benzonatate  100 MG capsule Commonly known as: TESSALON  Take 1 capsule (100 mg total) by mouth 3 (three) times daily as needed for cough.   Biotin 5000 MCG Caps Take 5,000 mcg by mouth daily.   ferrous sulfate 325 (65 FE) MG tablet Take 1 tablet by mouth daily.   Fish Oil 1000 MG Caps Take 1,000 mg by mouth daily.   GINSENG COMPLEX PO Take 1 capsule by mouth daily.   guaiFENesin -codeine  100-10 MG/5ML syrup Take 10 mLs by mouth every 4 (four) hours as needed for cough.   ibuprofen  600 MG tablet Commonly known as: ADVIL  Take 1 tablet (600 mg total) by mouth every 6 (six) hours as needed for mild pain (pain score 1-3), headache or moderate pain (pain score 4-6).   multivitamin with minerals Tabs tablet Take 1 tablet by mouth daily.   ondansetron  4 MG tablet Commonly known as: ZOFRAN  Take 1 tablet (4 mg total) by mouth every 6 (six) hours as needed for nausea.   pantoprazole  40 MG tablet Commonly known as: PROTONIX  Take 1 tablet (40 mg total) by mouth daily.   Potassium Gluconate 2.5 MEQ Tabs Take by mouth.   rosuvastatin  10 MG tablet Commonly known as: CRESTOR  Take 10 mg by mouth daily.   Saw Palmetto 450 MG Caps Take 450 mg by mouth daily.   senna-docusate 8.6-50 MG tablet Commonly known as: Senokot-S Take 1 tablet by mouth at bedtime as needed for mild constipation.   tetrahydrozoline 0.05 % ophthalmic solution Place 1 drop into both eyes daily.   umeclidinium bromide  62.5 MCG/ACT Aepb Commonly known as: INCRUSE ELLIPTA  Inhale 1 puff into the lungs daily.   Vitamin D 50 MCG (2000 UT) tablet Take 2,000 Units by mouth daily.        Discharge Exam: Filed  Weights   01/21/24 0300  Weight: 61.4 kg   Vitals:   01/22/24 0830 01/22/24 1440  BP:  126/72  Pulse: 86 89  Resp: 18 18  Temp:  98.6 F (37 C)  SpO2:  98%   Examination: Physical Exam:  Constitutional: Thin African-American male in no acute distress Respiratory: Diminished to auscultation bilaterally with some coarse breath sounds, no wheezing, rales, rhonchi or crackles. Normal respiratory effort and patient is not tachypenic. No accessory muscle use.  Unlabored breathing Cardiovascular: RRR, no murmurs / rubs / gallops. S1 and S2 auscultated. No extremity edema.  Abdomen: Soft, non-tender, non-distended. Bowel sounds positive.  GU: Deferred. Musculoskeletal: No clubbing / cyanosis of digits/nails. No joint deformity upper and lower extremities.  Skin: No rashes, lesions, ulcers on a limited skin evaluation. No induration; Warm and dry.  Neurologic: CN 2-12 grossly intact with no focal deficits. Romberg sign and cerebellar reflexes not assessed.  Psychiatric: Normal judgment and insight. Alert and oriented x 3. Normal mood and appropriate affect.   Condition at discharge: stable  The results of significant diagnostics from this hospitalization (including imaging, microbiology, ancillary and laboratory) are listed below for reference.   Imaging Studies: CT Chest W Contrast Result Date: 01/20/2024 CLINICAL DATA:  History of cough and abnormal consolidation in the right upper lobe. EXAM: CT CHEST WITH CONTRAST TECHNIQUE: Multidetector CT imaging of the chest was performed during intravenous contrast administration. RADIATION DOSE REDUCTION: This exam was performed according to the departmental dose-optimization program which includes automated exposure control, adjustment of the mA and/or kV according to patient size and/or use of iterative reconstruction technique. CONTRAST:  50mL OMNIPAQUE  IOHEXOL  350 MG/ML SOLN COMPARISON:  Chest x-ray from earlier in the same day. FINDINGS:  Cardiovascular: Thoracic aorta demonstrates atherosclerotic calcifications. No large central pulmonary embolus is identified although timing was not performed for embolus evaluation. Some mild attenuation of the right pulmonary arterial branches is seen no coronary calcifications are noted. No cardiac enlargement is noted. Mediastinum/Nodes: Thoracic inlet is within normal limits. The esophagus is within normal limits. Right hilar and suprahilar adenopathy is identified with central necrosis highly suspicious for metastatic involvement. The largest of these is noted in the right perihilar region measuring 3.8 x 2.4 cm best seen on image number 80 of series 3. A precarinal node with central necrosis is noted measuring 2.0 cm in short axis. The esophagus as visualized is within normal limits. Lungs/Pleura: Left lung shows no acute infiltrate or effusion. Mild emphysematous changes are noted in the apex. Similar emphysematous changes are noted in the right apex. A large centrally necrotic mass lesion is identified which measures at least 4.9 x 4.7 cm in transverse and AP dimensions respectively. This along with the necrotic adenopathy centrally is consistent with primary pulmonary neoplasm till proven otherwise. Surrounding micro nodular changes are noted likely representing some local spread. Some branching centrally necrotic density is noted particularly in the right upper lobe peripherally suspicious for neoplastic involvement the bronchial tree. Upper Abdomen: Simple cyst is noted in the midportion of the right lobe of the liver. No other focal abnormality is noted. Musculoskeletal: No chest wall abnormality. No acute or significant osseous findings. IMPRESSION: Changes consistent with large right upper lobe necrotic mass lesion with associated necrotic hilar and mediastinal adenopathy. There is also some suggestion of ingrowth into the bronchial tree of the right upper lobe. Peripheral nodular changes are noted  also suspicious for local invasion. Referral to multi disciplinary pulmonary team is recommended. Tissue sampling is recommended as well. Aortic Atherosclerosis (ICD10-I70.0) and Emphysema (ICD10-J43.9). Electronically Signed   By: Violeta Grey M.D.   On: 01/20/2024 22:54   DG Chest 2 View Result Date: 01/20/2024 CLINICAL DATA:  Coughing for 3 months EXAM: CHEST - 2 VIEW COMPARISON:  None Available. FINDINGS: Area of consolidation along the inferior right upper lobe. Question enlargement of the right lung hilum as well. No pneumothorax, effusion or edema. Normal cardiopericardial silhouette. IMPRESSION: Consolidation in the inferior right upper lobe with some fullness of the right lung hilum. Please correlate for any clinical evidence of infection or infiltrate. However with the history of 3 months, a different pathology would be possible and would recommend a contrast CT scan of the chest to further delineate etiology 1 clinically appropriate. Electronically Signed   By: Adrianna Horde M.D.   On:  01/20/2024 14:58    Microbiology: Results for orders placed or performed during the hospital encounter of 01/20/24  Resp panel by RT-PCR (RSV, Flu A&B, Covid) Anterior Nasal Swab     Status: None   Collection Time: 01/20/24 11:45 PM   Specimen: Anterior Nasal Swab  Result Value Ref Range Status   SARS Coronavirus 2 by RT PCR NEGATIVE NEGATIVE Final   Influenza A by PCR NEGATIVE NEGATIVE Final   Influenza B by PCR NEGATIVE NEGATIVE Final    Comment: (NOTE) The Xpert Xpress SARS-CoV-2/FLU/RSV plus assay is intended as an aid in the diagnosis of influenza from Nasopharyngeal swab specimens and should not be used as a sole basis for treatment. Nasal washings and aspirates are unacceptable for Xpert Xpress SARS-CoV-2/FLU/RSV testing.  Fact Sheet for Patients: BloggerCourse.com  Fact Sheet for Healthcare Providers: SeriousBroker.it  This test is not yet  approved or cleared by the [place]  FDA and has been authorized for detection and/or diagnosis of SARS-CoV-2 by FDA under an Emergency Use Authorization (EUA). This EUA will remain in effect (meaning this test can be used) for the duration of the COVID-19 declaration under Section 564(b)(1) of the Act, 21 U.S.C. section 360bbb-3(b)(1), unless the authorization is terminated or revoked.     Resp Syncytial Virus by PCR NEGATIVE NEGATIVE Final    Comment: (NOTE) Fact Sheet for Patients: BloggerCourse.com  Fact Sheet for Healthcare Providers: SeriousBroker.it  This test is not yet approved or cleared by the [place]  FDA and has been authorized for detection and/or diagnosis of SARS-CoV-2 by FDA under an Emergency Use Authorization (EUA). This EUA will remain in effect (meaning this test can be used) for the duration of the COVID-19 declaration under Section 564(b)(1) of the Act, 21 U.S.C. section 360bbb-3(b)(1), unless the authorization is terminated or revoked.  Performed at [facility], [address] [place], [place] [address]    Labs: CBC: Recent Labs  Lab 01/20/24 1329 01/21/24 0510 01/22/24 1223  WBC 9.7 6.7 14.0*  NEUTROABS  --   --  11.0*  HGB 9.4* 9.3* 9.0*  HCT 30.2* 27.9* 27.9*  MCV 83.0 79.9* 80.2  PLT 429* 403* 437*   Basic Metabolic Panel: Recent Labs  Lab 01/20/24 1329 01/21/24 0510 01/22/24 1223  NA 134* 136 136  K 3.9 3.4* 3.8  CL 99 102 99  CO2 22 24 24   GLUCOSE 100* 117* 109*  BUN 10 9 19   CREATININE 0.70 0.74 0.85  CALCIUM  8.7* 9.0 8.8*  MG  --  1.8 1.7  PHOS  --   --  3.2   Liver Function Tests: Recent Labs  Lab 01/22/24 1223  AST 20  ALT 17  ALKPHOS 51  BILITOT 0.4  PROT 6.7  ALBUMIN 2.2*   CBG: No results for input(s): "GLUCAP" in the last 168 hours.  Discharge time spent: greater than 30 minutes.  Signed: Aura Leeds, DO [facility] 01/24/2024

## 2024-01-24 ENCOUNTER — Encounter (HOSPITAL_COMMUNITY): Payer: Self-pay | Admitting: Critical Care Medicine

## 2024-01-24 NOTE — Hospital Course (Signed)
 The patient is a 63 year old African-American male with a past medical history significant for predominantly distant history of prostate cancer, alcohol abuse as well as history of vaping quit 2 months ago who presents with a nonstop cough. He presented with chest and back pain on the right side that have been going on for months and had episode of hemoptysis.  He has had some dyspnea on exertion and has lost about 10 pounds in the last 2 months and poor appetite which she attributed to.  Further workup was initiated and was found to have a large lung mass and PCCM was consulted and he underwent bronchoscopy.  Bronchoscopy samples were taken and there was concern for lung cancer.  He was treated with antitussives for his cough pulmonary recommended outpatient follow-up with referral to oncology, MRI brain and PET scan if his path results test positive.  Given that he is well pulmonary recommends outpatient follow-up and discharging on a LAMA and recommended PCP follow-up and pulmonary follow-up within the coming weeks evaluate with outpatient PFTs as well.  Assessment and Plan:  Abnormal CT - necrotic right upper lobe mass lesion with local invasion and adenopathy Chronic cough likely secondary to lung cancer - Pulmonology consult and patient underwent bronchoscopy with brushings done and biopsies. - Pain control and cough control - so far Ibuprofen  has helped a great deal.  Will add Robitussin with codeine . - Pulmonary recommends following up on the biopsy results in outpatient setting recommends discharge at this time given that he stable and will have outpatient follow-up further evaluation for emphysema and will get outpatient PFTs.  He is going be discharged on a LAMA to see if this helps his dyspnea on exertion.   Normocytic Anemia: - guaiac stool Negative - Start Protonix  empirically - Ibuprofen  has really helped the patient's pain.  If he is guaiac negative consider restarting the  ibuprofen . -Hgb/Hct trend:  Recent Labs  Lab 01/20/24 1329 01/21/24 0510 01/22/24 1223  HGB 9.4* 9.3* 9.0*  HCT 30.2* 27.9* 27.9*  MCV 83.0 79.9* 80.2  - Anemia panel in outpatient setting and continue monitor for signs of symptom bleeding and repeat CBC within 1 week  Leukocytosis: Likely reactive after his bronch.  No signs of infection.  Continue monitor and trend repeat CBC within 1 week  Thrombocytosis: Mild and likely reactive. Plt Count went from 403 -> 437. CTM and Trend and repeat CBC w/in 1 week  Hypoalbuminemia: Patient's Albumin is now 2.2. CTM and Trend and repeat CMP in the AM

## 2024-01-26 ENCOUNTER — Other Ambulatory Visit: Payer: Self-pay | Admitting: Acute Care

## 2024-01-26 NOTE — Telephone Encounter (Signed)
 Copied from CRM [id number]. Topic: Clinical - Medication Refill >> Jan 26, 2024  4:08 PM Juliaette Ober wrote: Most Recent Primary Care Visit:   Medication: guaiFENesin -codeine  100-10 MG/5ML syrup  Has the patient contacted their pharmacy? No (Agent: If no, request that the patient contact the pharmacy for the refill. If patient does not wish to contact the pharmacy document the reason why and proceed with request.) (Agent: If yes, when and what did the pharmacy advise?)  Is this the correct pharmacy for this prescription? Yes If no, delete pharmacy and type the correct one.  This is the patient's preferred pharmacy:  [facility] [address] [place], [place] - [address] [place] [address] Phone: [phone] Fax: [phone]   Has the prescription been filled recently? Yes  Is the patient out of the medication? Yes  Has the patient been seen for an appointment in the last year OR does the patient have an upcoming appointment? Yes  Can we respond through MyChart? Yes  Agent: Please be advised that Rx refills may take up to 3 business days. We ask that you follow-up with your pharmacy. Patient is in urgent need of this medicine.

## 2024-01-27 ENCOUNTER — Telehealth: Payer: Self-pay | Admitting: Critical Care Medicine

## 2024-01-27 DIAGNOSIS — C3490 Malignant neoplasm of unspecified part of unspecified bronchus or lung: Secondary | ICD-10-CM

## 2024-01-27 NOTE — Telephone Encounter (Signed)
 I spoke to Pathology about the pending specimens. Malignant cells present, but still working on defining subtype of cancer. He will update the report to indicate this. I placed referrals for brain MRI, PET scan, and referral to Oncology.   I called Jeff Romero and his wife Jeff Romero to update them. They know the upcoming tests he has ordered and the referral for oncology has been placed. He has a follow up appointment on Monday.  Jeff Mussel, DO 01/27/24 4:52 PM  Pulmonary & Critical Care  For contact information, see Amion. If no response to pager, please call PCCM consult pager. After hours, 7PM- 7AM, please call Elink.

## 2024-01-28 ENCOUNTER — Telehealth: Payer: Self-pay

## 2024-01-28 ENCOUNTER — Telehealth (HOSPITAL_COMMUNITY): Payer: Self-pay

## 2024-01-28 LAB — CYTOLOGY - NON PAP

## 2024-01-28 NOTE — Telephone Encounter (Signed)
 Pt has yet to be seen by a provider we are unable to refill a controlled substance he may speak w/ the provider at his appointment as well as none of our providers Rx it.    Pt states that Dr. Palmer Bobo said she will call [facility] for us  to refill the Rx

## 2024-01-29 ENCOUNTER — Ambulatory Visit (HOSPITAL_COMMUNITY)
Admission: EM | Admit: 2024-01-29 | Discharge: 2024-01-29 | Disposition: A | Discharge status: Home / Self Care | Attending: Emergency Medicine | Admitting: Emergency Medicine

## 2024-01-29 ENCOUNTER — Other Ambulatory Visit: Payer: Self-pay

## 2024-01-29 ENCOUNTER — Encounter (HOSPITAL_COMMUNITY): Payer: Self-pay | Admitting: *Deleted

## 2024-01-29 DIAGNOSIS — Z760 Encounter for issue of repeat prescription: Secondary | ICD-10-CM | POA: Diagnosis not present

## 2024-01-29 DIAGNOSIS — R053 Chronic cough: Secondary | ICD-10-CM | POA: Diagnosis not present

## 2024-01-29 MED ORDER — BENZONATATE 100 MG PO CAPS: 100.0000 mg | ORAL_CAPSULE | Freq: Three times a day (TID) | ORAL | 0 refills | Status: DC | PRN

## 2024-01-29 MED ORDER — GUAIFENESIN-CODEINE 100-10 MG/5ML PO SOLN
10.0000 mL | ORAL | 0 refills | Status: DC | PRN
Start: 2024-01-29 — End: 2024-01-31

## 2024-01-29 MED ORDER — IBUPROFEN 600 MG PO TABS: 600.0000 mg | ORAL_TABLET | Freq: Four times a day (QID) | ORAL | 0 refills | Status: DC | PRN

## 2024-01-29 NOTE — ED Provider Notes (Signed)
 [facility]    CSN: [id number] Arrival date & time: 01/29/24  1110      History   Chief Complaint Chief Complaint  Patient presents with   Medication Refill    HPI Jeff Romero is a 63 y.o. male.  Patient with recent diagnosis of a lung mass concerning for malignancy presents to the urgent cared with concerns of a medication refill.  He reports that he currently takes guaifenesin -codeine , ibuprofen , and Tessalon  since being discharged in the hospital.  Patient reports that he is out of his medications denies any refills on his medications for continued symptom control.  He reached out to his pulmonologist who advised coming to the urgent care for potential refills of the medications.  He is concerned that if he cannot get these medications refilled, he will likely end up in the emergency department once again for symptom control.  He reports that while on these medications, his symptoms are usually adequately controlled.   Medication Refill   Past Medical History:  Diagnosis Date   Allergy    Anxiety    Arthritis    Blood transfusion without reported diagnosis    Cataract    Depression    Prostate cancer [facility])     Patient Active Problem List   Diagnosis Date Noted   Mass of right lung 01/21/2024   Cavitating mass in right upper lung lobe 01/20/2024   Epigastric pain    Hepatitis    Hyperbilirubinemia    Malnutrition of moderate degree 07/14/2016   Transaminitis 07/13/2016   Abdominal pain 07/13/2016   Nausea & vomiting 07/13/2016   Alcohol abuse 07/13/2016    Past Surgical History:  Procedure Laterality Date   BRONCHIAL BIOPSY  01/21/2024   Procedure: BRONCHOSCOPY, WITH BIOPSY;  Surgeon: Joesph Mussel, DO;  Location: [facility];  Service: Cardiopulmonary;;   BRONCHIAL BRUSHINGS  01/21/2024   Procedure: BRONCHOSCOPY, WITH BRUSH BIOPSY;  Surgeon: Joesph Mussel, DO;  Location: [facility];  Service: Cardiopulmonary;;   FLEXIBLE BRONCHOSCOPY N/A  01/21/2024   Procedure: Cordella Deter;  Surgeon: Joesph Mussel, DO;  Location: [facility];  Service: Cardiopulmonary;  Laterality: N/A;   INGUINAL HERNIA REPAIR Right 09/18/2021   Procedure: OPEN RIGHT INGUINAL HERNIA REPAIR WITH MESH;  Surgeon: Kinsinger, Alphonso Aschoff, MD;  Location: [facility];  Service: General;  Laterality: Right;   KNEE CARTILAGE SURGERY Right    PROSTATE SURGERY     ROTATOR CUFF REPAIR Right        Home Medications    Prior to Admission medications   Medication Sig Start Date End Date Taking? Authorizing Provider  acetaminophen  (TYLENOL ) 325 MG tablet Take 2 tablets (650 mg total) by mouth every 6 (six) hours as needed for mild pain (pain score 1-3) (or Fever >/= 101). 01/22/24  Yes Sheikh, Omair Latif, DO  pantoprazole  (PROTONIX ) 40 MG tablet Take 1 tablet (40 mg total) by mouth daily. 01/23/24  Yes Sheikh, Omair Latif, DO  senna-docusate (SENOKOT-S) 8.6-50 MG tablet Take 1 tablet by mouth at bedtime as needed for mild constipation. 01/22/24  Yes Sheikh, Omair Latif, DO  umeclidinium bromide  (INCRUSE ELLIPTA ) 62.5 MCG/ACT AEPB Inhale 1 puff into the lungs daily. 01/23/24  Yes Sheikh, Omair Latif, DO  albuterol (VENTOLIN HFA) 108 (90 Base) MCG/ACT inhaler Inhale 1 puff into the lungs every 6 (six) hours as needed for wheezing or shortness of breath. 12/23/23   [provider]  benzonatate  (TESSALON ) 100 MG capsule Take 1 capsule (100 mg total)  by mouth 3 (three) times daily as needed for cough. 01/29/24   Nil Xiong A, PA-C  Biotin 5000 MCG CAPS Take 5,000 mcg by mouth daily. Patient not taking: Reported on 01/21/2024    [provider]  Cholecalciferol (VITAMIN D) 50 MCG (2000 UT) tablet Take 2,000 Units by mouth daily. Patient not taking: Reported on 01/21/2024    [provider]  ferrous sulfate 325 (65 FE) MG tablet Take 1 tablet by mouth daily. Patient not taking: Reported on 01/21/2024    [provider]  guaiFENesin -codeine   100-10 MG/5ML syrup Take 10 mLs by mouth every 4 (four) hours as needed for cough. 01/29/24   [place] Segler A, PA-C  ibuprofen  (ADVIL ) 600 MG tablet Take 1 tablet (600 mg total) by mouth every 6 (six) hours as needed for mild pain (pain score 1-3), headache or moderate pain (pain score 4-6). 01/29/24   Nomar Broad A, PA-C  Misc Natural Products (GINSENG COMPLEX PO) Take 1 capsule by mouth daily. Patient not taking: Reported on 01/21/2024    [provider]  Multiple Vitamin (MULTIVITAMIN WITH MINERALS) TABS tablet Take 1 tablet by mouth daily. Patient not taking: Reported on 01/21/2024    [provider]  Omega-3 Fatty Acids (FISH OIL) 1000 MG CAPS Take 1,000 mg by mouth daily. Patient not taking: Reported on 01/21/2024    [provider]  ondansetron  (ZOFRAN ) 4 MG tablet Take 1 tablet (4 mg total) by mouth every 6 (six) hours as needed for nausea. 01/22/24   Sheikh, Omair Latif, DO  Potassium Gluconate 2.5 MEQ TABS Take by mouth. Patient not taking: Reported on 01/21/2024    [provider]  rosuvastatin (CRESTOR) 10 MG tablet Take 10 mg by mouth daily. 12/23/23   [provider]  Saw Palmetto 450 MG CAPS Take 450 mg by mouth daily. Patient not taking: Reported on 01/21/2024    [provider]  tetrahydrozoline 0.05 % ophthalmic solution Place 1 drop into both eyes daily.    [provider]    Family History Family History  Problem Relation Age of Onset   Sickle cell anemia Mother    Cirrhosis Mother    Alcohol abuse Mother    Bone cancer Father    Hypertension Sister    Hypertension Brother    Colon cancer Neg Hx    Esophageal cancer Neg Hx    Rectal cancer Neg Hx    Stomach cancer Neg Hx     Social History Social History   Tobacco Use   Smoking status: Former    Types: E-cigarettes    Quit date: 05/09/2014    Years since quitting: 9.7   Smokeless tobacco: Never   Tobacco comments:    Quit 5 years ago   Vaping Use    Vaping status: Former   Quit date: 12/21/2023  Substance Use Topics   Alcohol use: Yes    Alcohol/week: 6.0 standard drinks of alcohol    Types: 6 Cans of beer per week    Comment: every other day   Drug use: No     Allergies   Patient has no known allergies.   Review of Systems Review of Systems  Respiratory:  Positive for cough.   All other systems reviewed and are negative.    Physical Exam Triage Vital Signs ED Triage Vitals  Encounter Vitals Group     BP 01/29/24 1209 124/79     Systolic BP Percentile --      Diastolic  BP Percentile --      Pulse Rate 01/29/24 1209 (!) 105     Resp 01/29/24 1209 18     Temp 01/29/24 1209 98.1 F (36.7 C)     Temp src --      SpO2 01/29/24 1209 95 %     Weight --      Height --      Head Circumference --      Peak Flow --      Pain Score 01/29/24 1206 8     Pain Loc --      Pain Education --      Exclude from Growth Chart --    No data found.  Updated Vital Signs BP 124/79   Pulse (!) 105   Temp 98.1 F (36.7 C)   Resp 18   SpO2 95%   Visual Acuity Right Eye Distance:   Left Eye Distance:   Bilateral Distance:    Right Eye Near:   Left Eye Near:    Bilateral Near:     Physical Exam Vitals and nursing note reviewed.  Constitutional:      General: He is not in acute distress.    Appearance: He is well-developed.  HENT:     Head: Normocephalic and atraumatic.  Eyes:     Conjunctiva/sclera: Conjunctivae normal.  Cardiovascular:     Rate and Rhythm: Normal rate and regular rhythm.     Heart sounds: No murmur heard. Pulmonary:     Effort: Pulmonary effort is normal. No respiratory distress.     Breath sounds: Normal breath sounds. No wheezing or rales.  Abdominal:     Palpations: Abdomen is soft.     Tenderness: There is no abdominal tenderness.  Musculoskeletal:        General: No swelling.     Cervical back: Neck supple.  Skin:    General: Skin is warm and dry.     Capillary Refill: Capillary refill  takes less than 2 seconds.  Neurological:     Mental Status: He is alert.  Psychiatric:        Mood and Affect: Mood normal.      UC Treatments / Results  Labs (all labs ordered are listed, but only abnormal results are displayed) Labs Reviewed - No data to display  EKG   Radiology No results found.  Procedures Procedures (including critical care time)  Medications Ordered in UC Medications - No data to display  Initial Impression / Assessment and Plan / UC Course  I have reviewed the triage vital signs and the nursing notes.  Pertinent labs & imaging results that were available during my care of the patient were reviewed by me and considered in my medical decision making (see chart for details).     Patient with past history of recently diagnosed lung mass concern for poss malignancy went to the urgent care today with concerns of a cough and needing a medication refill.  Patient reports that he was seen by his pulmonologist but was unable to have his medications refilled a few days ago as they likely forgot given that he requested a refill.  Patient is scheduled to follow-up with his primary care provider again early next week and will request a refill at that point.  Physical exam reveals the patient is notably coughing.  No obvious hemoptysis. I advised patient that most medication refills are typically managed by PCP but given concern that patient's symptoms likely would result in a repeat  ED encounter, will send in refill of medications today. I advised patient to follow up with PCP/pulmonologist for further refills as needed. Patient otherwise stable for discharge home at this time with stable vitals. Final Clinical Impressions(s) / UC Diagnoses   Final diagnoses:  Chronic cough  Medication refill     Discharge Instructions      You were seen today for concerns of a medication refill and cough. I have refilled your medications. Please try to have these refills by  either your pulmonologist or primary care provider in the future. For any concerns of new or worsening symptoms, please present to the emergency department.     ED Prescriptions     Medication Sig Dispense Auth. Provider   benzonatate  (TESSALON ) 100 MG capsule Take 1 capsule (100 mg total) by mouth 3 (three) times daily as needed for cough. 20 capsule Kallyn Demarcus A, PA-C   ibuprofen  (ADVIL ) 600 MG tablet Take 1 tablet (600 mg total) by mouth every 6 (six) hours as needed for mild pain (pain score 1-3), headache or moderate pain (pain score 4-6). 20 tablet Saphyra Hutt A, PA-C   guaiFENesin -codeine  100-10 MG/5ML syrup Take 10 mLs by mouth every 4 (four) hours as needed for cough. 120 mL Alik Mawson A, PA-C      I have reviewed the PDMP during this encounter.   Ardon Franklin A, PA-C 01/29/24 1237

## 2024-01-29 NOTE — ED Triage Notes (Signed)
 PT Dc on Thursday and ran out of pain meds/cough. Guaif/codeine , ibuprofen  600mg , and benzonatate100mg . Pt new DX of tumor in chest. Family talked with DR Fulton Job the DC MD but foprgot to ask for refills.

## 2024-01-29 NOTE — Discharge Instructions (Signed)
 You were seen today for concerns of a medication refill and cough. I have refilled your medications. Please try to have these refills by either your pulmonologist or primary care provider in the future. For any concerns of new or worsening symptoms, please present to the emergency department.

## 2024-01-31 ENCOUNTER — Ambulatory Visit: Admitting: Acute Care

## 2024-01-31 ENCOUNTER — Telehealth: Payer: Self-pay

## 2024-01-31 ENCOUNTER — Encounter: Payer: Self-pay | Admitting: Acute Care

## 2024-01-31 VITALS — BP 153/31 | HR 113 | Ht 67.0 in | Wt 124.8 lb

## 2024-01-31 DIAGNOSIS — Z87891 Personal history of nicotine dependence: Secondary | ICD-10-CM

## 2024-01-31 DIAGNOSIS — R042 Hemoptysis: Secondary | ICD-10-CM | POA: Diagnosis not present

## 2024-01-31 DIAGNOSIS — C3411 Malignant neoplasm of upper lobe, right bronchus or lung: Secondary | ICD-10-CM

## 2024-01-31 DIAGNOSIS — Z9889 Other specified postprocedural states: Secondary | ICD-10-CM

## 2024-01-31 DIAGNOSIS — R630 Anorexia: Secondary | ICD-10-CM

## 2024-01-31 DIAGNOSIS — R059 Cough, unspecified: Secondary | ICD-10-CM

## 2024-01-31 DIAGNOSIS — R051 Acute cough: Secondary | ICD-10-CM

## 2024-01-31 DIAGNOSIS — R634 Abnormal weight loss: Secondary | ICD-10-CM | POA: Diagnosis not present

## 2024-01-31 DIAGNOSIS — R0609 Other forms of dyspnea: Secondary | ICD-10-CM

## 2024-01-31 MED ORDER — MEGESTROL ACETATE 20 MG PO TABS: 40.0000 mg | ORAL_TABLET | Freq: Three times a day (TID) | ORAL | 1 refills | Status: DC

## 2024-01-31 MED ORDER — BUDESONIDE-FORMOTEROL FUMARATE 160-4.5 MCG/ACT IN AERO: 2.0000 | INHALATION_SPRAY | Freq: Two times a day (BID) | RESPIRATORY_TRACT | 6 refills | Status: DC

## 2024-01-31 MED ORDER — GUAIFENESIN-CODEINE 100-10 MG/5ML PO SOLN: 10.0000 mL | ORAL | 0 refills | Status: DC | PRN

## 2024-01-31 NOTE — Progress Notes (Signed)
 History of Present Illness Jeff Romero is a 63 y.o. male  former smoker with new lung mass noted on CT chest done in the ER 01/20/2024.  He will be followed by Dr. Baldwin Levee.  Synopsis Jeff Romero is a 64 y/o gentleman with a history of prostate cancer ( resected, no chemo) who presented with several months of coughing and back and shoulder pain.   He came to the ED 01/20/2024 due to pain in his chest and back on the R side that has been ongoing for months and an episode of hemoptysis 4/17. He had one previous episode of hemoptysis several weeks ago, but at baseline coughs up white sputum. He has some DOE. He has lost about 10# over the last 2 months and has had poor appetite. He assumed his appetite was due to depression. He quit vaping about 1 month ago after 10 years and had previously smoked up to 1ppd x 30 years. No family history of lung cancer, but his father had bone cancer.  CT in the ED demonstrated a large lung mass for which PCCM was consulted. Pt.underwent bronchoscopy with biopsies as an inpatient on 01/21/2024 by Dr. Fulton Job. He is here as follow up to his bronchoscopy today, and to review cytology results.    01/31/2024 Pt. Presents for follow up after bronchoscopy with biopsies. Bronchoscopy was done 01/21/2024  while patient was an inpatient in the hospital.  Pt states he  did have some  bleeding after the bronchoscopy. This went on for the night after the procedure , but had mainly cleared , until today, when he coughed up some scan blood. This was bright red. He has a strong cough. He has been taking tylenol  and ibuprofen  , and he has  guaifenesin   with Codeine  cough syrup for cough suppression. He takes this every 3-4 hours as needed.He denies any fever, purulent secretions or worsening shortness of breath.  He endorses about 15 pounds weight loss in the last  6 weeks. He has no appetite , and finds eating difficult. We discussed using Boost and Ensure as supplements. He stated he has used  both in the past, and will try this again. I have also prescribed Megace to see if this will help with appetite stimulation.  We have reviewed cytology.  The right mainstem biopsy was positive for malignant cells consistent with a non-small cell carcinoma.  Additional immunohistochemical staining was done and the tumors immunoprofile is nonspecific.  Per the addendum differential diagnosis can include a lung and an upper gastrointestinal/pancreatobiliary primary among other possibilities.  As patient is losing weight and is very symptomatic with cough I have referred him urgently to both radiation oncology and medical oncology. I have prescribed additional cough medication with codeine  for cough suppression and I have started him on Megace for appetite stimulation.  Patient's PET scan and MR brain for staging were scheduled for Feb 10, 2024.  With the help of our patient care coordinators both of these imagings have been moved up to 02/01/2024 to expedite patient's care.  Patient states he has been unable to use his Incruse inhaler due to its powder base.  He states it makes his cough worse. I have switched his medication to Symbicort 2 puffs in the morning and 2 puffs in the evening to see if he tolerates the aerosolized medication better. He understands he is to stop his Incruse inhaler.  Patient's wife is a respiratory therapist.  I have reviewed symptoms that indicate seeking emergency care.  She verbalized understanding.  Test Results: Cytology 01/21/2024  A. LUNG, RIGHT MAINSTEM, BIOPSY:  - Malignant cells present, consistent with non-small cell carcinoma, see  comment   B. LUNG, RIGHT MAINSTEM, BRUSHING:  - Malignant cells present, consistent with non-small cell carcinoma, see  comment     Latest Ref Rng & Units 01/22/2024   12:23 PM 01/21/2024    5:10 AM 01/20/2024    1:29 PM  CBC  WBC 4.0 - 10.5 K/uL 14.0  6.7  9.7   Hemoglobin 13.0 - 17.0 g/dL 9.0  9.3  9.4   Hematocrit 39.0 -  52.0 % 27.9  27.9  30.2   Platelets 150 - 400 K/uL 437  403  429        Latest Ref Rng & Units 01/22/2024   12:23 PM 01/21/2024    5:10 AM 01/20/2024    1:29 PM  BMP  Glucose 70 - 99 mg/dL 643  329  518   BUN 8 - 23 mg/dL 19  9  10    Creatinine 0.61 - 1.24 mg/dL 8.41  6.60  6.30   Sodium 135 - 145 mmol/L 136  136  134   Potassium 3.5 - 5.1 mmol/L 3.8  3.4  3.9   Chloride 98 - 111 mmol/L 99  102  99   CO2 22 - 32 mmol/L 24  24  22    Calcium 8.9 - 10.3 mg/dL 8.8  9.0  8.7     BNP No results found for: "BNP"  ProBNP No results found for: "PROBNP"  PFT No results found for: "FEV1PRE", "FEV1POST", "FVCPRE", "FVCPOST", "TLC", "DLCOUNC", "PREFEV1FVCRT", "PSTFEV1FVCRT"  CT Chest W Contrast Result Date: 01/20/2024 CLINICAL DATA:  History of cough and abnormal consolidation in the right upper lobe. EXAM: CT CHEST WITH CONTRAST TECHNIQUE: Multidetector CT imaging of the chest was performed during intravenous contrast administration. RADIATION DOSE REDUCTION: This exam was performed according to the departmental dose-optimization program which includes automated exposure control, adjustment of the mA and/or kV according to patient size and/or use of iterative reconstruction technique. CONTRAST:  50mL OMNIPAQUE  IOHEXOL  350 MG/ML SOLN COMPARISON:  Chest x-ray from earlier in the same day. FINDINGS: Cardiovascular: Thoracic aorta demonstrates atherosclerotic calcifications. No large central pulmonary embolus is identified although timing was not performed for embolus evaluation. Some mild attenuation of the right pulmonary arterial branches is seen no coronary calcifications are noted. No cardiac enlargement is noted. Mediastinum/Nodes: Thoracic inlet is within normal limits. The esophagus is within normal limits. Right hilar and suprahilar adenopathy is identified with central necrosis highly suspicious for metastatic involvement. The largest of these is noted in the right perihilar region measuring 3.8  x 2.4 cm best seen on image number 80 of series 3. A precarinal node with central necrosis is noted measuring 2.0 cm in short axis. The esophagus as visualized is within normal limits. Lungs/Pleura: Left lung shows no acute infiltrate or effusion. Mild emphysematous changes are noted in the apex. Similar emphysematous changes are noted in the right apex. A large centrally necrotic mass lesion is identified which measures at least 4.9 x 4.7 cm in transverse and AP dimensions respectively. This along with the necrotic adenopathy centrally is consistent with primary pulmonary neoplasm till proven otherwise. Surrounding micro nodular changes are noted likely representing some local spread. Some branching centrally necrotic density is noted particularly in the right upper lobe peripherally suspicious for neoplastic involvement the bronchial tree. Upper Abdomen: Simple cyst is noted in the midportion of the right lobe  of the liver. No other focal abnormality is noted. Musculoskeletal: No chest wall abnormality. No acute or significant osseous findings. IMPRESSION: Changes consistent with large right upper lobe necrotic mass lesion with associated necrotic hilar and mediastinal adenopathy. There is also some suggestion of ingrowth into the bronchial tree of the right upper lobe. Peripheral nodular changes are noted also suspicious for local invasion. Referral to multi disciplinary pulmonary team is recommended. Tissue sampling is recommended as well. Aortic Atherosclerosis (ICD10-I70.0) and Emphysema (ICD10-J43.9). Electronically Signed   By: Violeta Grey M.D.   On: 01/20/2024 22:54   DG Chest 2 View Result Date: 01/20/2024 CLINICAL DATA:  Coughing for 3 months EXAM: CHEST - 2 VIEW COMPARISON:  None Available. FINDINGS: Area of consolidation along the inferior right upper lobe. Question enlargement of the right lung hilum as well. No pneumothorax, effusion or edema. Normal cardiopericardial silhouette. IMPRESSION:  Consolidation in the inferior right upper lobe with some fullness of the right lung hilum. Please correlate for any clinical evidence of infection or infiltrate. However with the history of 3 months, a different pathology would be possible and would recommend a contrast CT scan of the chest to further delineate etiology 1 clinically appropriate. Electronically Signed   By: Adrianna Horde M.D.   On: 01/20/2024 14:58     Past medical hx Past Medical History:  Diagnosis Date   Allergy    Anxiety    Arthritis    Blood transfusion without reported diagnosis    Cataract    Depression    Prostate cancer (HCC)      Social History   Tobacco Use   Smoking status: Former    Types: E-cigarettes    Quit date: 05/09/2014    Years since quitting: 9.7   Smokeless tobacco: Never   Tobacco comments:    Quit 5 years ago   Vaping Use   Vaping status: Former   Quit date: 12/21/2023  Substance Use Topics   Alcohol use: Yes    Alcohol/week: 6.0 standard drinks of alcohol    Types: 6 Cans of beer per week    Comment: every other day   Drug use: No    JeffKnaus reports that he quit smoking about 9 years ago. His smoking use included e-cigarettes. He has never used smokeless tobacco. He reports current alcohol use of about 6.0 standard drinks of alcohol per week. He reports that he does not use drugs.   Tobacco Cessation: Counseling given: Not Answered Tobacco comments: Quit 5 years ago  Former smoker, 30 pack smoking history. Quit vaping 1 month ago  Past surgical hx, Family hx, Social hx all reviewed.  Current Outpatient Medications on File Prior to Visit  Medication Sig   acetaminophen  (TYLENOL ) 325 MG tablet Take 2 tablets (650 mg total) by mouth every 6 (six) hours as needed for mild pain (pain score 1-3) (or Fever >/= 101).   albuterol (VENTOLIN HFA) 108 (90 Base) MCG/ACT inhaler Inhale 1 puff into the lungs every 6 (six) hours as needed for wheezing or shortness of breath.   benzonatate   (TESSALON ) 100 MG capsule Take 1 capsule (100 mg total) by mouth 3 (three) times daily as needed for cough.   desipramine (NORPRAMIN) 25 MG tablet Take 25 mg by mouth daily.   fenofibrate 160 MG tablet Take 160 mg by mouth daily.   guaiFENesin -codeine  100-10 MG/5ML syrup Take 10 mLs by mouth every 4 (four) hours as needed for cough.   ibuprofen  (ADVIL ) 600 MG tablet Take  1 tablet (600 mg total) by mouth every 6 (six) hours as needed for mild pain (pain score 1-3), headache or moderate pain (pain score 4-6).   ondansetron  (ZOFRAN ) 4 MG tablet Take 1 tablet (4 mg total) by mouth every 6 (six) hours as needed for nausea.   pantoprazole  (PROTONIX ) 40 MG tablet Take 1 tablet (40 mg total) by mouth daily.   rosuvastatin (CRESTOR) 10 MG tablet Take 10 mg by mouth daily.   Biotin 5000 MCG CAPS Take 5,000 mcg by mouth daily. (Patient not taking: Reported on 01/21/2024)   Cholecalciferol (VITAMIN D) 50 MCG (2000 UT) tablet Take 2,000 Units by mouth daily. (Patient not taking: Reported on 01/21/2024)   ferrous sulfate 325 (65 FE) MG tablet Take 1 tablet by mouth daily. (Patient not taking: Reported on 01/21/2024)   Misc Natural Products (GINSENG COMPLEX PO) Take 1 capsule by mouth daily. (Patient not taking: Reported on 01/21/2024)   Multiple Vitamin (MULTIVITAMIN WITH MINERALS) TABS tablet Take 1 tablet by mouth daily. (Patient not taking: Reported on 01/21/2024)   Omega-3 Fatty Acids (FISH OIL) 1000 MG CAPS Take 1,000 mg by mouth daily. (Patient not taking: Reported on 01/21/2024)   Potassium Gluconate 2.5 MEQ TABS Take by mouth. (Patient not taking: Reported on 01/21/2024)   Saw Palmetto 450 MG CAPS Take 450 mg by mouth daily. (Patient not taking: Reported on 01/21/2024)   senna-docusate (SENOKOT-S) 8.6-50 MG tablet Take 1 tablet by mouth at bedtime as needed for mild constipation. (Patient not taking: Reported on 01/31/2024)   tetrahydrozoline 0.05 % ophthalmic solution Place 1 drop into both eyes daily. (Patient  not taking: Reported on 01/31/2024)   umeclidinium bromide  (INCRUSE ELLIPTA ) 62.5 MCG/ACT AEPB Inhale 1 puff into the lungs daily. (Patient not taking: Reported on 01/31/2024)   No current facility-administered medications on file prior to visit.     No Known Allergies  Review Of Systems:  Constitutional:   +  weight loss, Nonight sweats,  Fevers, chills, +fatigue, or  lassitude.  HEENT:   No headaches,  Difficulty swallowing,  Tooth/dental problems, or  ++ Sore throat,                No sneezing, itching, ear ache, nasal congestion, post nasal drip,   CV:  + chest pain, No  Orthopnea, PND, swelling in lower extremities, anasarca, dizziness, palpitations, syncope.   GI  No heartburn, indigestion, abdominal pain, + nausea, no vomiting, diarrhea, change in bowel habits,++  loss of appetite, bloody stools.   Resp: + shortness of breath with exertion less at rest.  + excess mucus, no productive cough,  + non-productive cough,  + coughing up of blood.  No change in color of mucus.  + occasional  wheezing.  No chest wall deformity  Skin: no rash or lesions.  GU: no dysuria, change in color of urine, no urgency or frequency.  No flank pain, no hematuria   MS:  No joint pain or swelling.  + decreased range of motion.  + back pain.  Psych:  No change in mood or affect. + depression and  anxiety.  No memory loss.   Vital Signs BP (!) 153/31 (BP Location: Left Arm, Patient Position: Sitting, Cuff Size: Normal)   Pulse (!) 113   Ht 5\' 7"  (1.702 m)   Wt 124 lb 12.8 oz (56.6 kg)   SpO2 94%   BMI 19.55 kg/m    Physical Exam:  General- No distress,  A&Ox3, pleasant and anxious ENT: No  sinus tenderness, TM clear, pale nasal mucosa, no oral exudate,no post nasal drip, no LAN Cardiac: S1, S2, regular rate and rhythm, no murmur Chest: No wheeze/ rales/ dullness; no accessory muscle use, no nasal flaring, no sternal retractions Abd.: Soft Non-tender, ND, BS +, Body mass index is 19.55 kg/m.   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  I have referred you to both medical oncology and radiation oncology. You will get calls to schedule both consults.  I have called in additional cough syrup for your cough. I will start you on Megace to see if this can stimulate your appetite.  Add Boost or Ensure to your meal regimen for the extra calories.  We will try to get the PET scan and MR brain moved to sooner date. They know you are willing to travel.  PET and MR Brain have been moved up to 02/01/2024 at 10:30 MRI and 4 pm PET scan.  Call if you need us  before you get into see oncology. Seek emergency care if blood in sputum gets worse and does not stop.  I have renewed your codeine  cough syrup, take as needed up to every 4 hours. Do not drive if while taking Codeine  medication Stop Incruse Start Symbicort 2 puffs in the morning and 2 puffs in the evening. Rinse mouth after use. Use albuterol as needed for shortness of breath or wheezing.  Please contact office for sooner follow up if symptoms do not improve or worsen or seek emergency care    I spent 60 minutes dedicated to the care of this patient on the date of this encounter to include pre-visit review of records, face-to-face time with the patient discussing conditions above, post visit ordering of testing, clinical documentation with the electronic health record, making appropriate referrals as documented, and communicating necessary information to the patient's healthcare team.   Raejean Bullock, NP 01/31/2024  3:34 PM

## 2024-01-31 NOTE — Patient Instructions (Addendum)
 It is good to see you today. Your lung biopsy was positive for a non small cell lung cancer.  I have referred you to both medical oncology and radiation oncology. You will get calls to schedule both consults.  I have called in additional cough syrup for your cough. I will start you on Megace to see if this can stimulate your appetite.  Add Boost or Ensure to your meal regimen for the extra calories.  We will try to get the PET scan and MR brain moved to sooner date. They know you are willing to travel.  PET and MR Brain have been moved up to 02/01/2024 at 10:30 MRI and 4 pm PET scan.  Call if you need us  before you get into see oncology. Seek emergency care if blood in sputum gets worse and does not stop.  Stop Incruse Start Symbicort 2 puffs in the morning and 2 puffs in the evening. Rinse mouth after use. Use albuterol as needed for shortness of breath or wheezing.  Please contact office for sooner follow up if symptoms do not improve or worsen or seek emergency care

## 2024-01-31 NOTE — Telephone Encounter (Signed)
 Called pt top try and resched appt for after 5/8 once pet scan is done and was informed that pt needs to be seen to get a prescriptions on his pain medicine as well as having issues of chest pain that needs addressing

## 2024-02-01 ENCOUNTER — Ambulatory Visit (HOSPITAL_COMMUNITY)
Admission: RE | Admit: 2024-02-01 | Discharge: 2024-02-01 | Disposition: A | Discharge status: Home / Self Care | Source: Ambulatory Visit | Attending: Critical Care Medicine | Admitting: Critical Care Medicine

## 2024-02-01 ENCOUNTER — Telehealth: Payer: Self-pay | Admitting: Critical Care Medicine

## 2024-02-01 DIAGNOSIS — C3490 Malignant neoplasm of unspecified part of unspecified bronchus or lung: Secondary | ICD-10-CM | POA: Diagnosis present

## 2024-02-01 LAB — GLUCOSE, CAPILLARY: Glucose-Capillary: 92 mg/dL (ref 70–99)

## 2024-02-01 MED ORDER — FLUDEOXYGLUCOSE F - 18 (FDG) INJECTION
6.2000 | Freq: Once | INTRAVENOUS | Status: AC
Start: 2024-02-01 — End: 2024-02-01
  Administered 2024-02-01: 6.169 via INTRAVENOUS

## 2024-02-01 MED ORDER — GADOBUTROL 1 MMOL/ML IV SOLN
6.0000 mL | Freq: Once | INTRAVENOUS | Status: AC | PRN
  Administered 2024-02-01: 6 mL via INTRAVENOUS

## 2024-02-01 NOTE — Telephone Encounter (Signed)
 Called patient's wife at the number listed in the chart to discuss the results of her husband's brain MRI-- at some point he had a clinically silent stroke, but there is no evidence of metastatic disease at this point. 3 month follow up brain MRI recommended, but nothing different to do right now. PET scan still on for this afternoon.  Joesph Mussel, DO 02/01/24 12:59 PM [facility]  For contact information, see Amion. If no response to pager, please call PCCM consult pager. After hours, 7PM- 7AM, please call Elink.

## 2024-02-01 NOTE — Progress Notes (Signed)
 Thoracic Location of Tumor / Histology: Right Lung  Patient presented with complaints of pain in his chest and back on the right side that has been ongoing for a couple of months.   PET 02/01/2024: As seen on prior CT scan centrally necrotic large right upper lobe hypermetabolic mass identified with nodular tissue tracking to the hilum and associated bronchial occlusion.  Several areas of abnormal hypermetabolic nodes including subcarinal, right hilar, right paratracheal as well as supraclavicular on the right and right internal mammary chain. No left-sided hypermetabolic hilar or mediastinal nodes.  Solitary hypermetabolic lesion along the right iliac bone with a lucent lesion on CT worrisome for a solitary bone metastasis.   MRI Brain 02/01/2024   Biopsies of Right Lung-Mainstem 01/21/2024   Past/Anticipated interventions by cardiothoracic surgery, if any:   Past/Anticipated interventions by medical oncology, if any:     Tobacco/Marijuana/Snuff/ETOH use: Former smoker.   Signs/Symptoms Weight changes, if any: 15 pound weight loss in the past month due to decreased appetite. Respiratory complaints, if any: He notes some SOB with activities. Hemoptysis, if any: He reports persistent occasional cough, he has some mucus, last episode of hemoptysis was 01/31/2024, darker red in color medium amount. Pain issues, if any:  He reports some chest pain and upper right back  SAFETY ISSUES: Prior radiation? No Pacemaker/ICD?  No Possible current pregnancy? N/a Is the patient on methotrexate? No   Current Complaints / other details:

## 2024-02-03 ENCOUNTER — Encounter: Payer: Self-pay | Admitting: Radiation Oncology

## 2024-02-03 ENCOUNTER — Ambulatory Visit
Admission: RE | Admit: 2024-02-03 | Discharge: 2024-02-03 | Disposition: A | Discharge status: Home / Self Care | Source: Ambulatory Visit | Attending: Radiation Oncology | Admitting: Radiation Oncology

## 2024-02-03 ENCOUNTER — Other Ambulatory Visit: Payer: Self-pay

## 2024-02-03 ENCOUNTER — Telehealth: Payer: Self-pay | Admitting: Acute Care

## 2024-02-03 ENCOUNTER — Telehealth: Payer: Self-pay | Admitting: Radiation Oncology

## 2024-02-03 VITALS — Ht 67.0 in | Wt 125.0 lb

## 2024-02-03 DIAGNOSIS — C3411 Malignant neoplasm of upper lobe, right bronchus or lung: Secondary | ICD-10-CM

## 2024-02-03 DIAGNOSIS — C3490 Malignant neoplasm of unspecified part of unspecified bronchus or lung: Secondary | ICD-10-CM

## 2024-02-03 NOTE — Telephone Encounter (Signed)
 Pt's wife called about 7/1 MRI. She states pt does not want to go to [facility] for upcoming MRI and would like for it to be at [facility] since pt and spouse are familiar with location. Message routed to [facility] who placed orders. Pt's wife stated she canceled appt with [facility] for 7/1 to be seen at [facility].

## 2024-02-03 NOTE — Telephone Encounter (Signed)
 Called patient to ensure he had his appointments with Rad onc and Med onc. He had a telephone visit this morning with RAD onc, and has appointment with Dr. Marguerita Shih 02/07/2024. He confirmed the appointments. Nothing further needed.  Raejean Bullock, MSN, AGACNP-BC [facility] See Amion for personal pager PCCM on call pager [phone]

## 2024-02-03 NOTE — Progress Notes (Signed)
 Radiation Oncology         [phone] ________________________________  Initial Outpatient Consultation - Conducted via telephone at patient request.  I spoke with the patient to conduct this consult visit via telephone. The patient was notified in advance and was offered an in person or telemedicine meeting to allow for face to face communication but instead preferred to proceed with a telephone consult.  Name: Abid Fass        MRN: [id number]  Date of Service: 02/03/2024 DOB: 01/25/1961  YN:[id number], [facility]  Fulton Job, Roselyn Connor, DO     REFERRING PHYSICIAN: Joesph Mussel, DO   DIAGNOSIS: The primary encounter diagnosis was Malignant neoplasm of upper lobe of right lung (HCC). A diagnosis of Malignant neoplasm of unspecified part of unspecified bronchus or lung (HCC) was also pertinent to this visit.   HISTORY OF PRESENT ILLNESS: Azazel Ezernack is a 63 y.o. male seen at the request of Dr. Fulton Job for a new diagnosis of lung cancer involving the right upper lobe.  The patient was evaluated in the emergency department on 01/20/2024 due to progressive cough and frank hemoptysis.  He had a chest x-ray that showed concerns for consolidation in the right upper lobe and fullness in the hilum.  A CT with contrast of the chest that day showed a 4.9 cm centrally necrotic mass in the right upper lobe at the apex as well as centrally necrotic adenopathy involving the right perihilar region and precarinal station.  He was evaluated and by Dr. Fulton Job and taken for endoscopy on 01/21/2024 where a lesion was seen in the right mainstem bronchus.  This was biopsied and had stigmata of bleeding which was controlled with epinephrine .  Pathology from that procedure showed non-small cell carcinoma favoring a nonspecific immunoprofile.  He was counseled on the need for additional staging and had an MRI of the brain on 02/01/2024 which showed a nonenhancing focus of abnormal diffusion in the left frontal lobe favored to be a  small vessel infarct rather than metastasis and it was recommended that a repeat MRI was performed in 3 months time.  A PET scan that day as well showed the known centrally necrotic right upper lobe mass that was hypermetabolic with nodular tissue tracking to the hilum and resulting in associated bronchial occlusion with several slightly nodular change within the right upper lobe concerning for lymphangitic spread.  There were areas of hypermetabolic activity in the subcarinal, right hilar, right paratracheal, supraclavicular and right internal mammary chain.  A solid hypermetabolic lesion in the right iliac bone was concerning for metastatic disease as well.  He is seen to consider treatment for his cancer. Of note he has a history of 4+3 prostate adenocarcinoma, staged as pT2N1M0 with  ECE treated with prostatectomy at [facility]. He had an undetectable PSA in December 2024.    PREVIOUS RADIATION THERAPY: No   PAST MEDICAL HISTORY:  Past Medical History:  Diagnosis Date   Allergy    Anxiety    Arthritis    Blood transfusion without reported diagnosis    Cataract    Depression    Prostate cancer [facility])        PAST SURGICAL HISTORY: Past Surgical History:  Procedure Laterality Date   BRONCHIAL BIOPSY  01/21/2024   Procedure: BRONCHOSCOPY, WITH BIOPSY;  Surgeon: Joesph Mussel, DO;  Location: [facility] ENDOSCOPY;  Service: Cardiopulmonary;;   BRONCHIAL BRUSHINGS  01/21/2024   Procedure: BRONCHOSCOPY, WITH BRUSH BIOPSY;  Surgeon: Joesph Mussel, DO;  Location:  [facility] ENDOSCOPY;  Service: Cardiopulmonary;;   FLEXIBLE BRONCHOSCOPY N/A 01/21/2024   Procedure: Cordella Deter;  Surgeon: Joesph Mussel, DO;  Location: [facility] ENDOSCOPY;  Service: Cardiopulmonary;  Laterality: N/A;   INGUINAL HERNIA REPAIR Right 09/18/2021   Procedure: OPEN RIGHT INGUINAL HERNIA REPAIR WITH MESH;  Surgeon: Kinsinger, Alphonso Aschoff, MD;  Location: [facility] ORS;  Service: General;  Laterality: Right;   KNEE CARTILAGE SURGERY Right     PROSTATE SURGERY     ROTATOR CUFF REPAIR Right      FAMILY HISTORY:  Family History  Problem Relation Age of Onset   Sickle cell anemia Mother    Cirrhosis Mother    Alcohol abuse Mother    Bone cancer Father    Hypertension Sister    Hypertension Brother    Colon cancer Neg Hx    Esophageal cancer Neg Hx    Rectal cancer Neg Hx    Stomach cancer Neg Hx      SOCIAL HISTORY:  reports that he quit smoking about 9 years ago. His smoking use included e-cigarettes. He has never used smokeless tobacco. He reports current alcohol use of about 6.0 standard drinks of alcohol per week. He reports that he does not use drugs. 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 His wife is a professor and teaches respiratory therapy students.    ALLERGIES: Patient has no known allergies.   MEDICATIONS:  Current Outpatient Medications  Medication Sig Dispense Refill   acetaminophen  (TYLENOL ) 325 MG tablet Take 2 tablets (650 mg total) by mouth every 6 (six) hours as needed for mild pain (pain score 1-3) (or Fever >/= 101). 20 tablet 0   albuterol (VENTOLIN HFA) 108 (90 Base) MCG/ACT inhaler Inhale 1 puff into the lungs every 6 (six) hours as needed for wheezing or shortness of breath.     benzonatate  (TESSALON ) 100 MG capsule Take 1 capsule (100 mg total) by mouth 3 (three) times daily as needed for cough. 20 capsule 0   budesonide -formoterol  (SYMBICORT ) 160-4.5 MCG/ACT inhaler Inhale 2 puffs into the lungs 2 (two) times daily. 1 each 6   desipramine (NORPRAMIN) 25 MG tablet Take 25 mg by mouth daily.     fenofibrate 160 MG tablet Take 160 mg by mouth daily.     guaiFENesin -codeine  100-10 MG/5ML syrup Take 10 mLs by mouth every 4 (four) hours as needed for cough. 120 mL 0   ibuprofen  (ADVIL ) 600 MG tablet Take 1 tablet (600 mg total) by mouth every 6 (six) hours as needed for mild pain (pain score 1-3), headache or moderate pain (pain score 4-6). 20 tablet 0    megestrol  (MEGACE ) 20 MG tablet Take 2 tablets (40 mg total) by mouth 4 (four) times daily - after meals and at bedtime. 60 tablet 1   ondansetron  (ZOFRAN ) 4 MG tablet Take 1 tablet (4 mg total) by mouth every 6 (six) hours as needed for nausea. 20 tablet 0   pantoprazole  (PROTONIX ) 40 MG tablet Take 1 tablet (40 mg total) by mouth daily. 30 tablet 0   rosuvastatin (CRESTOR) 10 MG tablet Take 10 mg by mouth daily.     Biotin 5000 MCG CAPS Take 5,000 mcg by mouth daily. (Patient not taking: Reported on 01/21/2024)     Cholecalciferol (VITAMIN D) 50 MCG (2000 UT) tablet Take 2,000 Units by mouth daily. (Patient not taking: Reported on 01/21/2024)     ferrous sulfate 325 (65 FE) MG tablet Take 1 tablet by mouth  daily. (Patient not taking: Reported on 01/21/2024)     Misc Natural Products (GINSENG COMPLEX PO) Take 1 capsule by mouth daily. (Patient not taking: Reported on 01/21/2024)     Multiple Vitamin (MULTIVITAMIN WITH MINERALS) TABS tablet Take 1 tablet by mouth daily. (Patient not taking: Reported on 01/21/2024)     Omega-3 Fatty Acids (FISH OIL) 1000 MG CAPS Take 1,000 mg by mouth daily. (Patient not taking: Reported on 01/21/2024)     Potassium Gluconate 2.5 MEQ TABS Take by mouth. (Patient not taking: Reported on 01/21/2024)     Saw Palmetto 450 MG CAPS Take 450 mg by mouth daily. (Patient not taking: Reported on 01/21/2024)     senna-docusate (SENOKOT-S) 8.6-50 MG tablet Take 1 tablet by mouth at bedtime as needed for mild constipation. (Patient not taking: Reported on 02/03/2024) 30 tablet 0   tetrahydrozoline 0.05 % ophthalmic solution Place 1 drop into both eyes daily. (Patient not taking: Reported on 01/31/2024)     umeclidinium bromide  (INCRUSE ELLIPTA ) 62.5 MCG/ACT AEPB Inhale 1 puff into the lungs daily. (Patient not taking: Reported on 02/03/2024) 30 each 0   No current facility-administered medications for this encounter.     REVIEW OF SYSTEMS: On review of systems, the patient reports that he  is doing okay. He's had an additional episode of hemoptysis on Monday but not as severe as when he presented in April to the hospital. He describes poor appetite, and a 15 pound weight loss in the last month as a result. He has shortness of breath with exertion, and persistent, productive cough with usually clear mucous. He does describe a palpable fullness in the right breast area consistent with the intramammary node, and also upper right sided chest and back pain. No other complaints are verbalized.     PHYSICAL EXAM:  Unable to assess   ECOG = 1  0 - Asymptomatic (Fully active, able to carry on all predisease activities without restriction)  1 - Symptomatic but completely ambulatory (Restricted in physically strenuous activity but ambulatory and able to carry out work of a light or sedentary nature. For example, light housework, office work)  2 - Symptomatic, <50% in bed during the day (Ambulatory and capable of all self care but unable to carry out any work activities. Up and about more than 50% of waking hours)  3 - Symptomatic, >50% in bed, but not bedbound (Capable of only limited self-care, confined to bed or chair 50% or more of waking hours)  4 - Bedbound (Completely disabled. Cannot carry on any self-care. Totally confined to bed or chair)  5 - Death   Aurea Blossom MM, Creech RH, Tormey DC, et al. [phone]). "Toxicity and response criteria of the [facility] Group". Am. Hillard Lowes. Oncol. 5 (6): 649-55    LABORATORY DATA:  Lab Results  Component Value Date   WBC 14.0 (H) 01/22/2024   HGB 9.0 (L) 01/22/2024   HCT 27.9 (L) 01/22/2024   MCV 80.2 01/22/2024   PLT 437 (H) 01/22/2024   Lab Results  Component Value Date   NA 136 01/22/2024   K 3.8 01/22/2024   CL 99 01/22/2024   CO2 24 01/22/2024   Lab Results  Component Value Date   ALT 17 01/22/2024   AST 20 01/22/2024   ALKPHOS 51 01/22/2024   BILITOT 0.4 01/22/2024      RADIOGRAPHY: NM PET Image Initial  (PI) Skull Base To Thigh Result Date: 02/02/2024 CLINICAL DATA:  Initial treatment strategy for non-small-cell  lung cancer. EXAM: NUCLEAR MEDICINE PET SKULL BASE TO THIGH TECHNIQUE: 6.169 mCi F-18 FDG was injected intravenously. Full-ring PET imaging was performed from the skull base to thigh after the radiotracer. CT data was obtained and used for attenuation correction and anatomic localization. Fasting blood glucose: 92 mg/dl COMPARISON:  Chest CT with contrast 01/20/2024 FINDINGS: Mediastinal blood pool activity: SUV max 2.1 Liver activity: SUV max 2.2 NECK: Supraclavicular lymph node mass identified on the right side with maximum SUV value of 8.6. This is seen just to the right of the right thyroid lobe and on series 4, image 43 of the CT scan measures 2.9 by 2.3 cm. No additional abnormal areas of uptake identified in the neck including along other lymph node chains. Near symmetric uptake of radiotracer along the visualized intracranial compartment. Incidental CT findings: Visualized portions of the paranasal sinuses and mastoid air cells are clear. The parotid glands, submandibular glands and thyroid glands unremarkable. Scattered vascular calcifications. CHEST: As seen on the prior CT scan there is a large heterogeneous centrally necrotic/cavitary mass in the right upper lobe that extends out to the pleural margin posterolaterally. Maximum SUV value of 12.6. The central mass on contrast CT scan was measured at 4.9 x 4.7 cm. There is confluent tissue extending to the hilum which has a nodular contour as well. This could be a combination of tumor and lymph node. This would have maximum SUV value approaching 12.1 and mass is measured on CT image 69 measures 4.0 x 4.2 cm. There is a associated nodular mass within the course of the right main bronchus with occlusion. Several satellite lesions are identified elsewhere in the right upper lobe. Additional components of reticulonodular changes in the right upper  lobe which could represent lymphangitic spread of disease. Additional hypermetabolic nodes identified including along the internal mammary chain on the right with maximum SUV value of 12.0. Mass on CT image 60 in this location measures 3.8 by 2.4 cm. Additional abnormal nodes right paratracheal, subcarinal. The right paratracheal lesion on CT image 66 measures 3.2 by 2.6 cm and has maximum SUV approaching 9.0. Subcarinal lesion has maximum SUV 9.3. Of note there are no areas of abnormal uptake along left-sided nodes in the mediastinum or left hilum. No axillary nodes. In addition no abnormal uptake in the left lung. Incidental CT findings: Heart is nonenlarged. No pericardial effusion. The thoracic aorta has a normal course and caliber. Scattered calcified plaque at the aortic arch. Breathing motion. Underlying centrilobular and paraseptal emphysematous changes. Apical subpleural blebs. ABDOMEN/PELVIS: There is physiologic distribution radiotracer along the parenchymal organs, bowel and renal collecting systems. Incidental CT findings: Benign right hepatic lobe cysts. Minimal thickening of left adrenal gland but no uptake. No renal or ureteral stones identified. Bowel is nondilated. Scattered stool. Gallbladder is nondilated. Motion. Diffuse vascular calcifications. SKELETON: Abnormal uptake identified along a lytic lesion in the right iliac bone with maximum SUV value of 5.3 consistent with a bone metastasis. No other clear hypermetabolic bone lesions identified in the visualized osseous structures. Incidental CT findings: Mild curvature of the lumbar spine. Scattered degenerative changes of the spine and pelvis. IMPRESSION: As seen on prior CT scan centrally necrotic large right upper lobe hypermetabolic mass identified with nodular tissue tracking to the hilum and associated bronchial occlusion. There are several slightly lesions in the right upper lobe as well with reticulonodular changes seen of the lungs which  could represent lymphangitic spread of disease. Several areas of abnormal hypermetabolic nodes including subcarinal, right hilar,  right paratracheal as well as supraclavicular on the right and right internal mammary chain. No left-sided hypermetabolic hilar or mediastinal nodes. Solitary hypermetabolic lesion along the right iliac bone with a lucent lesion on CT worrisome for a solitary bone metastasis. Electronically Signed   By: Adrianna Horde M.D.   On: 02/02/2024 16:39   MR BRAIN W WO CONTRAST Addendum Date: 02/01/2024 ADDENDUM REPORT: 02/01/2024 12:13 ADDENDUM: Study discussed by telephone with Dr. Alisia Apple on 02/01/2024 at 1158 hours. Electronically Signed   By: Marlise Simpers M.D.   On: 02/01/2024 12:13   Result Date: 02/01/2024 CLINICAL DATA:  63 year old male with recently diagnosed large right upper lobe lung mass. Staging. By report no neurologic symptoms at this time. EXAM: MRI HEAD WITHOUT AND WITH CONTRAST TECHNIQUE: Multiplanar, multiecho pulse sequences of the brain and surrounding structures were obtained without and with intravenous contrast. CONTRAST:  6mL GADAVIST  GADOBUTROL  1 MMOL/ML IV SOLN COMPARISON:  None Available. FINDINGS: Brain: No abnormal enhancement identified. No dural thickening. No midline shift, mass effect, or evidence of intracranial mass lesion. But positive for a small clustered, slightly linear area of abnormal diffusion mostly subcortical white matter at the junction of the left posterosuperior and middle frontal gyri (series 5, image 35 and series 13, images 17 and 18). Faint associated FLAIR hyperintensity there. No enhancement there. No evidence of hemorrhage. No mass effect. No other restricted diffusion to suggest acute infarction. No ventriculomegaly, extra-axial collection or acute intracranial hemorrhage. Cervicomedullary junction and pituitary are within normal limits. Elsewhere gray and white matter signal is within normal limits for age. No cortical encephalomalacia  or chronic cerebral blood products identified. Vascular: Major intracranial vascular flow voids are preserved, the distal left vertebral artery appears to be dominant (normal variant). Following contrast the major dural venous sinuses are enhancing and appear to be patent. Skull and upper cervical spine: Heterogeneous bone marrow signal in the visible upper cervical spine, but no destructive or definitely enhancing osseous lesion is identified. Negative visible cervical spinal cord. Sinuses/Orbits: Negative orbits. Paranasal sinuses and mastoids are stable and well aerated. Other: Visible internal auditory structures appear normal. IMPRESSION: 1. Positive for subcentimeter non-enhancing focus of abnormal diffusion in the left frontal lobe. Uvaldo Garfinkel this is a clinically silent small-vessel infarct, rather than tiny metastasis. No associated hemorrhage or mass effect. Recommend repeat MRI without and with contrast in 3 months to re-evaluate. 2. Otherwise negative for age MRI appearance of the Brain. Heterogeneous but probably benign bone marrow signal in the visible cervical spine. Electronically Signed: By: Marlise Simpers M.D. On: 02/01/2024 11:45   CT Chest W Contrast Result Date: 01/20/2024 CLINICAL DATA:  History of cough and abnormal consolidation in the right upper lobe. EXAM: CT CHEST WITH CONTRAST TECHNIQUE: Multidetector CT imaging of the chest was performed during intravenous contrast administration. RADIATION DOSE REDUCTION: This exam was performed according to the departmental dose-optimization program which includes automated exposure control, adjustment of the mA and/or kV according to patient size and/or use of iterative reconstruction technique. CONTRAST:  50mL OMNIPAQUE  IOHEXOL  350 MG/ML SOLN COMPARISON:  Chest x-ray from earlier in the same day. FINDINGS: Cardiovascular: Thoracic aorta demonstrates atherosclerotic calcifications. No large central pulmonary embolus is identified although timing was not  performed for embolus evaluation. Some mild attenuation of the right pulmonary arterial branches is seen no coronary calcifications are noted. No cardiac enlargement is noted. Mediastinum/Nodes: Thoracic inlet is within normal limits. The esophagus is within normal limits. Right hilar and suprahilar adenopathy is identified with central  necrosis highly suspicious for metastatic involvement. The largest of these is noted in the right perihilar region measuring 3.8 x 2.4 cm best seen on image number 80 of series 3. A precarinal node with central necrosis is noted measuring 2.0 cm in short axis. The esophagus as visualized is within normal limits. Lungs/Pleura: Left lung shows no acute infiltrate or effusion. Mild emphysematous changes are noted in the apex. Similar emphysematous changes are noted in the right apex. A large centrally necrotic mass lesion is identified which measures at least 4.9 x 4.7 cm in transverse and AP dimensions respectively. This along with the necrotic adenopathy centrally is consistent with primary pulmonary neoplasm till proven otherwise. Surrounding micro nodular changes are noted likely representing some local spread. Some branching centrally necrotic density is noted particularly in the right upper lobe peripherally suspicious for neoplastic involvement the bronchial tree. Upper Abdomen: Simple cyst is noted in the midportion of the right lobe of the liver. No other focal abnormality is noted. Musculoskeletal: No chest wall abnormality. No acute or significant osseous findings. IMPRESSION: Changes consistent with large right upper lobe necrotic mass lesion with associated necrotic hilar and mediastinal adenopathy. There is also some suggestion of ingrowth into the bronchial tree of the right upper lobe. Peripheral nodular changes are noted also suspicious for local invasion. Referral to multi disciplinary pulmonary team is recommended. Tissue sampling is recommended as well. Aortic  Atherosclerosis (ICD10-I70.0) and Emphysema (ICD10-J43.9). Electronically Signed   By: Violeta Grey M.D.   On: 01/20/2024 22:54   DG Chest 2 View Result Date: 01/20/2024 CLINICAL DATA:  Coughing for 3 months EXAM: CHEST - 2 VIEW COMPARISON:  None Available. FINDINGS: Area of consolidation along the inferior right upper lobe. Question enlargement of the right lung hilum as well. No pneumothorax, effusion or edema. Normal cardiopericardial silhouette. IMPRESSION: Consolidation in the inferior right upper lobe with some fullness of the right lung hilum. Please correlate for any clinical evidence of infection or infiltrate. However with the history of 3 months, a different pathology would be possible and would recommend a contrast CT scan of the chest to further delineate etiology 1 clinically appropriate. Electronically Signed   By: Adrianna Horde M.D.   On: 01/20/2024 14:58       IMPRESSION/PLAN: 1.  Stage IV, cT2bN2M1c, NSCLC, NOS of the RUL. Dr. Jeryl Moris discusses the pathology findings and reviews the nature of metastatic lung cancer. Dr. Jeryl Moris reviews the discussion of his case from this morning's thoracic oncology conference discussion. Dr. Marguerita Shih will see the patient next week and the patient will receive a call from the referral coordinator. Dr. Jeryl Moris discusses the rationale for palliative radiation to the RUL mass and the patient is aware that systemic therapy would be used to treat throughout the body. We discussed the risks, benefits, short, and long term effects of radiotherapy, as well as the curative intent, and the patient is interested in proceeding. Dr. Jeryl Moris discusses the delivery and logistics of radiotherapy and anticipates a course of 2 weeks of radiotherapy. The patient will be contacted to coordinate treatment planning by our simulation department.  2. Left frontal lobe enhancement. This area is suspicious for a small CVA and a repeat MRI will be ordered with 3T SRS protocol in 3 months per  neuroradiology recommendations.  3. History of pT2N1M0, 4+3 adenocarcinoma of the prostate s/p prostatectomy. He has been NED since prostatectomy in 2020 and will have PSA testing at [facility] in about a month.     This  encounter was conducted via telephone.  The patient has provided two factor identification and has given verbal consent for this type of encounter and has been advised to only accept a meeting of this type in a secure network environment. The time spent during this encounter was 60 minutes including preparation, discussion, and coordination of the patient's care. The attendants for this meeting include Kee Pastel, RN, Dr. Jeryl Moris, Bettejane Brownie  and [name] and his wife Hani Bladow. During the encounter,  Kee Pastel, RN, Dr. Jeryl Moris, and Bettejane Brownie were located at [facility] Radiation Oncology Department.  Brodee Mcgillivary was located at home and his wife Waylynn Leinberger was remotely at work.     The above documentation reflects my direct findings during this shared patient visit. Please see the separate note by Dr. Jeryl Moris on this date for the remainder of the patient's plan of care.    Shelvia Dick, [facility]   **Disclaimer: This note was dictated with voice recognition software. Similar sounding words can inadvertently be transcribed and this note may contain transcription errors which may not have been corrected upon publication of note.**

## 2024-02-03 NOTE — Progress Notes (Signed)
 Error

## 2024-02-03 NOTE — Progress Notes (Signed)
 The proposed treatment discussed in conference is for discussion purpose only and is not a binding recommendation.  The patients have not been physically examined, or presented with their treatment options.  Therefore, final treatment plans cannot be decided.

## 2024-02-04 ENCOUNTER — Ambulatory Visit
Admission: RE | Admit: 2024-02-04 | Discharge: 2024-02-04 | Disposition: A | Discharge status: Home / Self Care | Source: Ambulatory Visit | Attending: Radiation Oncology | Admitting: Radiation Oncology

## 2024-02-04 ENCOUNTER — Other Ambulatory Visit: Payer: Self-pay

## 2024-02-04 ENCOUNTER — Ambulatory Visit: Payer: Self-pay | Admitting: Acute Care

## 2024-02-04 DIAGNOSIS — C3411 Malignant neoplasm of upper lobe, right bronchus or lung: Secondary | ICD-10-CM | POA: Diagnosis present

## 2024-02-04 DIAGNOSIS — Z510 Encounter for antineoplastic radiation therapy: Secondary | ICD-10-CM | POA: Insufficient documentation

## 2024-02-04 DIAGNOSIS — Z87891 Personal history of nicotine dependence: Secondary | ICD-10-CM | POA: Diagnosis not present

## 2024-02-04 NOTE — Telephone Encounter (Addendum)
 Summary: cough/needs mdicine   Copied From CRM [id number]. Reason for Triage: pt is still having a bad cough.  Wanting refill of benzonatate  (TESSALON ) 100 MG capsule Pt has also been taking cough medicine more often, and only has one day left.. Needs another refill to get through weekend..pt starts radiation on Tues, so needs some relief          This encounter was created in error - please disregard.

## 2024-02-04 NOTE — Telephone Encounter (Signed)
 E2C2 Pulmonary Triage - Initial Assessment Questions "Chief Complaint (e.g., cough, sob, wheezing, fever, chills, sweat or additional symptoms) *Go to specific symptom protocol after initial questions. Patient and wife calling about patient's cough medication. Patient has run out of Tessalon  Pearles and is out of Cough Syrup. Wife is on the phone and states that after taking a dose of cough syrup, typically is taking another dose within 20-30 minutes. Patient endorses a chronic cough that isn't responding to medication like it was. Some shortness of breath but no increases. Patient and wife instructed at this time to get seen in Urgent Care for medication refills. Discussed the concern that patient is taking more of cough syrup then is prescribed. Patient will need a follow up call on his cough medication. Discussed with wife the possible need for ED visit over the weekend. Wife states that she will take the patient to the ED if need be but will take him to Urgent care at this time.   "How long have symptoms been present?" Cough has been going on for awhile  Have you tested for COVID or Flu? Note: If not, ask patient if a home test can be taken. If so, instruct patient to call back for positive results. No  MEDICINES:   "Have you used any OTC meds to help with symptoms?" No If yes, ask "What medications?"   "Have you used your inhalers/maintenance medication?" Yes If yes, "What medications?" albuterol  If inhaler, ask "How many puffs and how often?" Note: Review instructions on medication in the chart. 1 puff every 6hr  OXYGEN: "Do you wear supplemental oxygen?" No If yes, "How many liters are you supposed to use?"   "Do you monitor your oxygen levels?" Yes If yes, "What is your reading (oxygen level) today?" 98  "What is your usual oxygen saturation reading?"  (Note: Pulmonary O2 sats should be 90% or greater) 95-98%   Copied from CRM #[id number]. Topic: Clinical - Pink Word Triage >>  Feb 04, 2024  3:56 PM Jeff Romero wrote: Reason for Triage: pt is still having a bad cough.  Wanting refill of benzonatate  (TESSALON ) 100 MG capsule Pt has also been taking cough medicine more often, and only has one day left.. Needs another refill to get through weekend..pt starts radiation on Tues, so needs some relief. Reason for Disposition  [1] MILD difficulty breathing (e.g., minimal/no SOB at rest, SOB with walking, pulse <100) AND [2] still present when not coughing  Answer Assessment - Initial Assessment Questions 1. ONSET: "When did the cough begin?"      Chronic cough 2. SEVERITY: "How bad is the cough today?"      8 3. SPUTUM: "Describe the color of your sputum" (none, dry cough; clear, white, yellow, green)     white 4. HEMOPTYSIS: "Are you coughing up any blood?" If so ask: "How much?" (flecks, streaks, tablespoons, etc.)     No blood today or yesterday 5. DIFFICULTY BREATHING: "Are you having difficulty breathing?" If Yes, ask: "How bad is it?" (e.g., mild, moderate, severe)    - MILD: No SOB at rest, mild SOB with walking, speaks normally in sentences, can lie down, no retractions, pulse < 100.    - MODERATE: SOB at rest, SOB with minimal exertion and prefers to sit, cannot lie down flat, speaks in phrases, mild retractions, audible wheezing, pulse 100-120.    - SEVERE: Very SOB at rest, speaks in single words, struggling to breathe, sitting hunched forward, retractions, pulse > 120  Mild  6. FEVER: "Do you have a fever?" If Yes, ask: "What is your temperature, how was it measured, and when did it start?"     no 7. CARDIAC HISTORY: "Do you have any history of heart disease?" (e.g., heart attack, congestive heart failure)      no 8. LUNG HISTORY: "Do you have any history of lung disease?"  (e.g., pulmonary embolus, asthma, emphysema)     Lung CA 9. PE RISK FACTORS: "Do you have a history of blood clots?" (or: recent major surgery, recent prolonged travel, bedridden)      no 10. OTHER SYMPTOMS: "Do you have any other symptoms?" (e.g., runny nose, wheezing, chest pain)       no 12. TRAVEL: "Have you traveled out of the country in the last month?" (e.g., travel history, exposures)       no  Protocols used: Cough - Acute Productive-A-AH

## 2024-02-07 ENCOUNTER — Other Ambulatory Visit: Payer: Self-pay

## 2024-02-07 ENCOUNTER — Inpatient Hospital Stay: Attending: Internal Medicine | Admitting: Internal Medicine

## 2024-02-07 ENCOUNTER — Encounter (HOSPITAL_BASED_OUTPATIENT_CLINIC_OR_DEPARTMENT_OTHER): Payer: Self-pay

## 2024-02-07 ENCOUNTER — Inpatient Hospital Stay

## 2024-02-07 ENCOUNTER — Other Ambulatory Visit: Payer: Self-pay | Admitting: Acute Care

## 2024-02-07 VITALS — BP 112/55 | HR 110 | Temp 98.9°F | Resp 18 | Ht 67.0 in | Wt 124.3 lb

## 2024-02-07 DIAGNOSIS — C7951 Secondary malignant neoplasm of bone: Secondary | ICD-10-CM | POA: Diagnosis present

## 2024-02-07 DIAGNOSIS — C3411 Malignant neoplasm of upper lobe, right bronchus or lung: Secondary | ICD-10-CM

## 2024-02-07 DIAGNOSIS — E860 Dehydration: Secondary | ICD-10-CM | POA: Diagnosis not present

## 2024-02-07 DIAGNOSIS — Z87891 Personal history of nicotine dependence: Secondary | ICD-10-CM | POA: Diagnosis not present

## 2024-02-07 DIAGNOSIS — Z79899 Other long term (current) drug therapy: Secondary | ICD-10-CM | POA: Diagnosis not present

## 2024-02-07 DIAGNOSIS — C778 Secondary and unspecified malignant neoplasm of lymph nodes of multiple regions: Secondary | ICD-10-CM | POA: Diagnosis not present

## 2024-02-07 DIAGNOSIS — R052 Subacute cough: Secondary | ICD-10-CM

## 2024-02-07 DIAGNOSIS — C61 Malignant neoplasm of prostate: Secondary | ICD-10-CM | POA: Insufficient documentation

## 2024-02-07 DIAGNOSIS — Z809 Family history of malignant neoplasm, unspecified: Secondary | ICD-10-CM

## 2024-02-07 DIAGNOSIS — Z832 Family history of diseases of the blood and blood-forming organs and certain disorders involving the immune mechanism: Secondary | ICD-10-CM

## 2024-02-07 LAB — CMP (CANCER CENTER ONLY)
ALT: 20 U/L (ref 0–44)
AST: 16 U/L (ref 15–41)
Albumin: 3.3 g/dL — ABNORMAL LOW (ref 3.5–5.0)
Alkaline Phosphatase: 73 U/L (ref 38–126)
Anion gap: 8 (ref 5–15)
BUN: 13 mg/dL (ref 8–23)
CO2: 25 mmol/L (ref 22–32)
Calcium: 9 mg/dL (ref 8.9–10.3)
Chloride: 102 mmol/L (ref 98–111)
Creatinine: 0.87 mg/dL (ref 0.61–1.24)
GFR, Estimated: >60 mL/min (ref >60)
Glucose, Bld: 112 mg/dL — ABNORMAL HIGH (ref 70–99)
Potassium: 3.9 mmol/L (ref 3.5–5.1)
Sodium: 135 mmol/L (ref 135–145)
Total Bilirubin: 0.3 mg/dL (ref 0.0–1.2)
Total Protein: 7.1 g/dL (ref 6.5–8.1)

## 2024-02-07 LAB — CBC WITH DIFFERENTIAL (CANCER CENTER ONLY)
Abs Immature Granulocytes: 0.06 10*3/uL (ref 0.00–0.07)
Basophils Absolute: 0.1 10*3/uL (ref 0.0–0.1)
Basophils Relative: 1 %
Eosinophils Absolute: 0.1 10*3/uL (ref 0.0–0.5)
Eosinophils Relative: 1 %
HCT: 27 % — ABNORMAL LOW (ref 39.0–52.0)
Hemoglobin: 8.8 g/dL — ABNORMAL LOW (ref 13.0–17.0)
Immature Granulocytes: 1 %
Lymphocytes Relative: 9 %
Lymphs Abs: 1.1 10*3/uL (ref 0.7–4.0)
MCH: 25.4 pg — ABNORMAL LOW (ref 26.0–34.0)
MCHC: 32.6 g/dL (ref 30.0–36.0)
MCV: 77.8 fL — ABNORMAL LOW (ref 80.0–100.0)
Monocytes Absolute: 1 10*3/uL (ref 0.1–1.0)
Monocytes Relative: 8 %
Neutro Abs: 9.6 10*3/uL — ABNORMAL HIGH (ref 1.7–7.7)
Neutrophils Relative %: 80 %
Platelet Count: 459 10*3/uL — ABNORMAL HIGH (ref 150–400)
RBC: 3.47 MIL/uL — ABNORMAL LOW (ref 4.22–5.81)
RDW: 14.6 % (ref 11.5–15.5)
WBC Count: 11.8 10*3/uL — ABNORMAL HIGH (ref 4.0–10.5)
nRBC: 0 % (ref 0.0–0.2)

## 2024-02-07 MED ORDER — BENZONATATE 100 MG PO CAPS: 100.0000 mg | ORAL_CAPSULE | Freq: Four times a day (QID) | ORAL | 0 refills | Status: DC | PRN

## 2024-02-07 MED ORDER — FOLIC ACID 1 MG PO TABS: 1.0000 mg | ORAL_TABLET | Freq: Every day | ORAL | 3 refills | Status: DC

## 2024-02-07 MED ORDER — PROCHLORPERAZINE MALEATE 10 MG PO TABS
10.0000 mg | ORAL_TABLET | Freq: Four times a day (QID) | ORAL | 1 refills | Status: DC | PRN
Start: 2024-02-07 — End: 2024-03-01

## 2024-02-07 MED ORDER — CYANOCOBALAMIN 1000 MCG/ML IJ SOLN
1000.0000 ug | Freq: Once | INTRAMUSCULAR | Status: AC
  Administered 2024-02-07: 1000 ug via INTRAMUSCULAR
  Filled 2024-02-07: qty 1

## 2024-02-07 NOTE — Telephone Encounter (Signed)
 The Tessalon  was RX to pt via hospital. Robbert Childes to rx this to pt?

## 2024-02-07 NOTE — Progress Notes (Signed)
 I met pt face to face today at his oncology consult with Dr. Marguerita Shih. Pt was accompanied by his wife, [name]. The treatment plan for the pt is to have palliative radiation to the bulky dz in his chest. His lung tissue was recently sent to [facility] for molecular studies and PD-L1, and the Foundation Liquid CDx was collected today with his blood work prior to his appt with Dr.Mohamed. If pt does not have any actionable biomarkers, the plan is for Carbo/Alimta/Keytruda. Referral placed to Palliative care to address pain in his back and chest. Pt will return in approx 2 weeks to review molecular studies and determine what his treatment will be. I provided the Lung Cancer Journey Book to the pt and his wife, as well as information on the [facility] and the services offere, and the lung cancer support group. Pt and wife appreciated information provided. No questions at the conclusion of the appt. I provided my card and encouraged them to call me with any questions or concerns.

## 2024-02-07 NOTE — Progress Notes (Signed)
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   [id number]:Pembrolizumab 200 mg  IV D1 + Pemetrexed 500 mg/m2 IV D1 + Carboplatin AUC=5 IV D1 q21 Days:   A cycle is every 21 days:     Pembrolizumab      Pemetrexed      Carboplatin   **Always confirm dose/schedule in your pharmacy ordering system**  Patient Characteristics: Stage IV Metastatic, Nonsquamous, Awaiting Molecular Test Results and Need to Start Chemotherapy, PS = 0, 1 Therapeutic Status: Stage IV Metastatic Histology: Nonsquamous Cell Broad Molecular Profiling Status: Awaiting Molecular Test Results and Need to Start Chemotherapy ECOG Performance Status: 1 Intent of Therapy: Non-Curative / Palliative Intent, Discussed with Patient 

## 2024-02-07 NOTE — Telephone Encounter (Signed)
 It looks like the Tessalon  was prescribed to him while he was admitted. Pt was requesting a refill. Are you okay with refilling the tessalon  rx?

## 2024-02-07 NOTE — Progress Notes (Signed)
 [facility] [phone]   [phone]  CONSULT NOTE  REFERRING PHYSICIAN: Jaquita Merl, DO  REASON FOR CONSULTATION:  63 years old African-American male recently diagnosed with lung cancer.  HPI Jeff Romero is a 63 y.o. male.  Discussed the use of AI scribe software for clinical note transcription with the patient, who gave verbal consent to proceed.  History of Present Illness   Jeff Romero is a 63 year old male with lung cancer who presents for oncology consultation. He is accompanied by his wife, Jeff Romero.  In February or March 2025, he developed a persistent cough that intensified over time, becoming unbearable. Initial urgent care visits suggested bronchitis or allergies, and he was prescribed medication. However, the symptoms persisted, leading to a visit to the emergency room on January 20, 2024, where a chest x-ray and CT scan revealed a right centrally necrotic mass and lymph node involvement. Subsequent bronchoscopy and biopsy confirmed non-small cell lung cancer. 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  He experiences pain in the right side of his chest and upper back, which he initially associated with coughing. The pain is managed with Tylenol  and ibuprofen  600 mg. He experiences shortness of breath during physical activity and a persistent cough, which is sometimes productive. He uses guaifenesin  with codeine  to manage the cough and has recently started Symbicort , which he feels may  worsen his symptoms.  He has experienced weight loss of approximately 15 pounds over the last three months. No recent hemoptysis, though he had a small amount of blood in his sputum following the biopsy. He reports occasional constipation, which he attributes to decreased food intake and depression following his cancer diagnosis.  His past medical history includes prostate cancer treated with surgery in 2022, allergies, anxiety, depression, and osteoarthritis. He has a significant smoking history, having smoked from age 88 until about 10 years ago, and briefly used vaping as a cessation aid. He denies alcohol use and drug abuse.  Family history includes a father who had cancer that spread to the bone, but no siblings with cancer. He is married with three children and grandchildren. He worked in Aeronautical engineer and has been smoke-free for the past decade.        HPI  Past Medical History:  Diagnosis Date   Allergy    Anxiety    Arthritis    Blood transfusion without reported diagnosis    Cataract    Depression    Prostate cancer [facility])     Past Surgical History:  Procedure Laterality Date   BRONCHIAL BIOPSY  01/21/2024   Procedure: BRONCHOSCOPY, WITH BIOPSY;  Surgeon: Joesph Mussel, DO;  Location: [facility];  Service: Cardiopulmonary;;   BRONCHIAL BRUSHINGS  01/21/2024   Procedure: BRONCHOSCOPY, WITH BRUSH BIOPSY;  Surgeon: Joesph Mussel, DO;  Location: [facility];  Service: Cardiopulmonary;;   FLEXIBLE BRONCHOSCOPY N/A 01/21/2024   Procedure: Cordella Deter;  Surgeon: Joesph Mussel, DO;  Location: [facility];  Service: Cardiopulmonary;  Laterality: N/A;   INGUINAL HERNIA REPAIR Right 09/18/2021   Procedure: OPEN RIGHT INGUINAL HERNIA REPAIR WITH MESH;  Surgeon: Kinsinger, Alphonso Aschoff, MD;  Location:  [facility];  Service: General;  Laterality: Right;   KNEE CARTILAGE SURGERY Right    PROSTATE SURGERY     ROTATOR CUFF REPAIR Right     Family History  Problem Relation Age of  Onset   Sickle cell anemia Mother    Cirrhosis Mother    Alcohol abuse Mother    Bone cancer Father    Hypertension Sister    Hypertension Brother    Colon cancer Neg Hx    Esophageal cancer Neg Hx    Rectal cancer Neg Hx    Stomach cancer Neg Hx     Social History Social History   Tobacco Use   Smoking status: Former    Types: E-cigarettes    Quit date: 05/09/2014    Years since quitting: 9.7   Smokeless tobacco: Never   Tobacco comments:    Quit 5 years ago   Vaping Use   Vaping status: Former   Quit date: 12/21/2023  Substance Use Topics   Alcohol use: Yes    Alcohol/week: 6.0 standard drinks of alcohol    Types: 6 Cans of beer per week    Comment: every other day   Drug use: No    No Known Allergies  Current Outpatient Medications  Medication Sig Dispense Refill   acetaminophen  (TYLENOL ) 325 MG tablet Take 2 tablets (650 mg total) by mouth every 6 (six) hours as needed for mild pain (pain score 1-3) (or Fever >/= 101). 20 tablet 0   albuterol (VENTOLIN HFA) 108 (90 Base) MCG/ACT inhaler Inhale 1 puff into the lungs every 6 (six) hours as needed for wheezing or shortness of breath.     benzonatate  (TESSALON ) 100 MG capsule Take 1 capsule (100 mg total) by mouth 3 (three) times daily as needed for cough. 20 capsule 0   Biotin 5000 MCG CAPS Take 5,000 mcg by mouth daily. (Patient not taking: Reported on 01/21/2024)     budesonide -formoterol  (SYMBICORT ) 160-4.5 MCG/ACT inhaler Inhale 2 puffs into the lungs 2 (two) times daily. 1 each 6   Cholecalciferol (VITAMIN D) 50 MCG (2000 UT) tablet Take 2,000 Units by mouth daily. (Patient not taking: Reported on 01/21/2024)     desipramine (NORPRAMIN) 25 MG tablet Take 25 mg by mouth daily.     fenofibrate 160 MG tablet Take 160 mg by mouth daily.     ferrous sulfate 325 (65 FE) MG tablet Take 1 tablet by mouth daily. (Patient not taking: Reported on 01/21/2024)     guaiFENesin -codeine  100-10 MG/5ML syrup Take 10 mLs by mouth every 4  (four) hours as needed for cough. 120 mL 0   ibuprofen  (ADVIL ) 600 MG tablet Take 1 tablet (600 mg total) by mouth every 6 (six) hours as needed for mild pain (pain score 1-3), headache or moderate pain (pain score 4-6). 20 tablet 0   megestrol  (MEGACE ) 20 MG tablet Take 2 tablets (40 mg total) by mouth 4 (four) times daily - after meals and at bedtime. 60 tablet 1   Misc Natural Products (GINSENG COMPLEX PO) Take 1 capsule by mouth daily. (Patient not taking: Reported on 01/21/2024)     Multiple Vitamin (MULTIVITAMIN WITH MINERALS) TABS tablet Take 1 tablet by mouth daily. (Patient not taking: Reported on 01/21/2024)     Omega-3 Fatty Acids (FISH OIL) 1000 MG CAPS Take 1,000 mg by mouth daily. (Patient not taking: Reported on 01/21/2024)     ondansetron  (ZOFRAN ) 4 MG tablet Take 1 tablet (4 mg total) by mouth every  6 (six) hours as needed for nausea. 20 tablet 0   pantoprazole  (PROTONIX ) 40 MG tablet Take 1 tablet (40 mg total) by mouth daily. 30 tablet 0   Potassium Gluconate 2.5 MEQ TABS Take by mouth. (Patient not taking: Reported on 01/21/2024)     rosuvastatin (CRESTOR) 10 MG tablet Take 10 mg by mouth daily.     Saw Palmetto 450 MG CAPS Take 450 mg by mouth daily. (Patient not taking: Reported on 01/21/2024)     senna-docusate (SENOKOT-S) 8.6-50 MG tablet Take 1 tablet by mouth at bedtime as needed for mild constipation. (Patient not taking: Reported on 02/03/2024) 30 tablet 0   tetrahydrozoline 0.05 % ophthalmic solution Place 1 drop into both eyes daily. (Patient not taking: Reported on 01/31/2024)     umeclidinium bromide  (INCRUSE ELLIPTA ) 62.5 MCG/ACT AEPB Inhale 1 puff into the lungs daily. (Patient not taking: Reported on 02/03/2024) 30 each 0   No current facility-administered medications for this visit.    Review of Systems  Constitutional: positive for anorexia, fatigue, and weight loss Eyes: negative Ears, nose, mouth, throat, and face: negative Respiratory: positive for cough, dyspnea  on exertion, and pleurisy/chest pain Cardiovascular: negative Gastrointestinal: negative Genitourinary:negative Integument/breast: negative Hematologic/lymphatic: negative Musculoskeletal:positive for back pain Neurological: negative Behavioral/Psych: negative Endocrine: negative Allergic/Immunologic: negative  Physical Exam  EXH:BZJIR, healthy, no distress, well developed, and malnourished SKIN: skin color, texture, turgor are normal HEAD: Normocephalic, No masses, lesions, tenderness or abnormalities EYES: normal, PERRLA, Conjunctiva are pink and non-injected EARS: External ears normal, Canals clear OROPHARYNX:no exudate, no erythema, and lips, buccal mucosa, and tongue normal  NECK: supple, no adenopathy, no JVD LYMPH:  no palpable lymphadenopathy, no hepatosplenomegaly LUNGS: clear to auscultation , and palpation HEART: regular rate & rhythm, no murmurs, and no gallops ABDOMEN:abdomen soft, non-tender, normal bowel sounds, and no masses or organomegaly BACK: Back symmetric, no curvature., No CVA tenderness EXTREMITIES:no joint deformities, effusion, or inflammation, no edema  NEURO: alert & oriented x 3 with fluent speech, no focal motor/sensory deficits  PERFORMANCE STATUS: ECOG 1  LABORATORY DATA: Lab Results  Component Value Date   WBC 11.8 (H) 02/07/2024   HGB 8.8 (L) 02/07/2024   HCT 27.0 (L) 02/07/2024   MCV 77.8 (L) 02/07/2024   PLT 459 (H) 02/07/2024      Chemistry      Component Value Date/Time   NA 136 01/22/2024 1223   K 3.8 01/22/2024 1223   CL 99 01/22/2024 1223   CO2 24 01/22/2024 1223   BUN 19 01/22/2024 1223   CREATININE 0.85 01/22/2024 1223      Component Value Date/Time   CALCIUM 8.8 (L) 01/22/2024 1223   ALKPHOS 51 01/22/2024 1223   AST 20 01/22/2024 1223   ALT 17 01/22/2024 1223   BILITOT 0.4 01/22/2024 1223       RADIOGRAPHIC STUDIES: NM PET Image Initial (PI) Skull Base To Thigh Result Date: 02/02/2024 CLINICAL DATA:  Initial  treatment strategy for non-small-cell lung cancer. EXAM: NUCLEAR MEDICINE PET SKULL BASE TO THIGH TECHNIQUE: 6.169 mCi F-18 FDG was injected intravenously. Full-ring PET imaging was performed from the skull base to thigh after the radiotracer. CT data was obtained and used for attenuation correction and anatomic localization. Fasting blood glucose: 92 mg/dl COMPARISON:  Chest CT with contrast 01/20/2024 FINDINGS: Mediastinal blood pool activity: SUV max 2.1 Liver activity: SUV max 2.2 NECK: Supraclavicular lymph node mass identified on the right side with maximum SUV value of 8.6. This is seen just to the  right of the right thyroid lobe and on series 4, image 43 of the CT scan measures 2.9 by 2.3 cm. No additional abnormal areas of uptake identified in the neck including along other lymph node chains. Near symmetric uptake of radiotracer along the visualized intracranial compartment. Incidental CT findings: Visualized portions of the paranasal sinuses and mastoid air cells are clear. The parotid glands, submandibular glands and thyroid glands unremarkable. Scattered vascular calcifications. CHEST: As seen on the prior CT scan there is a large heterogeneous centrally necrotic/cavitary mass in the right upper lobe that extends out to the pleural margin posterolaterally. Maximum SUV value of 12.6. The central mass on contrast CT scan was measured at 4.9 x 4.7 cm. There is confluent tissue extending to the hilum which has a nodular contour as well. This could be a combination of tumor and lymph node. This would have maximum SUV value approaching 12.1 and mass is measured on CT image 69 measures 4.0 x 4.2 cm. There is a associated nodular mass within the course of the right main bronchus with occlusion. Several satellite lesions are identified elsewhere in the right upper lobe. Additional components of reticulonodular changes in the right upper lobe which could represent lymphangitic spread of disease. Additional  hypermetabolic nodes identified including along the internal mammary chain on the right with maximum SUV value of 12.0. Mass on CT image 60 in this location measures 3.8 by 2.4 cm. Additional abnormal nodes right paratracheal, subcarinal. The right paratracheal lesion on CT image 66 measures 3.2 by 2.6 cm and has maximum SUV approaching 9.0. Subcarinal lesion has maximum SUV 9.3. Of note there are no areas of abnormal uptake along left-sided nodes in the mediastinum or left hilum. No axillary nodes. In addition no abnormal uptake in the left lung. Incidental CT findings: Heart is nonenlarged. No pericardial effusion. The thoracic aorta has a normal course and caliber. Scattered calcified plaque at the aortic arch. Breathing motion. Underlying centrilobular and paraseptal emphysematous changes. Apical subpleural blebs. ABDOMEN/PELVIS: There is physiologic distribution radiotracer along the parenchymal organs, bowel and renal collecting systems. Incidental CT findings: Benign right hepatic lobe cysts. Minimal thickening of left adrenal gland but no uptake. No renal or ureteral stones identified. Bowel is nondilated. Scattered stool. Gallbladder is nondilated. Motion. Diffuse vascular calcifications. SKELETON: Abnormal uptake identified along a lytic lesion in the right iliac bone with maximum SUV value of 5.3 consistent with a bone metastasis. No other clear hypermetabolic bone lesions identified in the visualized osseous structures. Incidental CT findings: Mild curvature of the lumbar spine. Scattered degenerative changes of the spine and pelvis. IMPRESSION: As seen on prior CT scan centrally necrotic large right upper lobe hypermetabolic mass identified with nodular tissue tracking to the hilum and associated bronchial occlusion. There are several slightly lesions in the right upper lobe as well with reticulonodular changes seen of the lungs which could represent lymphangitic spread of disease. Several areas of  abnormal hypermetabolic nodes including subcarinal, right hilar, right paratracheal as well as supraclavicular on the right and right internal mammary chain. No left-sided hypermetabolic hilar or mediastinal nodes. Solitary hypermetabolic lesion along the right iliac bone with a lucent lesion on CT worrisome for a solitary bone metastasis. Electronically Signed   By: Adrianna Horde M.D.   On: 02/02/2024 16:39   MR BRAIN W WO CONTRAST Addendum Date: 02/01/2024 ADDENDUM REPORT: 02/01/2024 12:13 ADDENDUM: Study discussed by telephone with Dr. Alisia Apple on 02/01/2024 at 1158 hours. Electronically Signed   By: Marlise Simpers  M.D.   On: 02/01/2024 12:13   Result Date: 02/01/2024 CLINICAL DATA:  63 year old male with recently diagnosed large right upper lobe lung mass. Staging. By report no neurologic symptoms at this time. EXAM: MRI HEAD WITHOUT AND WITH CONTRAST TECHNIQUE: Multiplanar, multiecho pulse sequences of the brain and surrounding structures were obtained without and with intravenous contrast. CONTRAST:  6mL GADAVIST  GADOBUTROL  1 MMOL/ML IV SOLN COMPARISON:  None Available. FINDINGS: Brain: No abnormal enhancement identified. No dural thickening. No midline shift, mass effect, or evidence of intracranial mass lesion. But positive for a small clustered, slightly linear area of abnormal diffusion mostly subcortical white matter at the junction of the left posterosuperior and middle frontal gyri (series 5, image 35 and series 13, images 17 and 18). Faint associated FLAIR hyperintensity there. No enhancement there. No evidence of hemorrhage. No mass effect. No other restricted diffusion to suggest acute infarction. No ventriculomegaly, extra-axial collection or acute intracranial hemorrhage. Cervicomedullary junction and pituitary are within normal limits. Elsewhere gray and white matter signal is within normal limits for age. No cortical encephalomalacia or chronic cerebral blood products identified. Vascular: Major  intracranial vascular flow voids are preserved, the distal left vertebral artery appears to be dominant (normal variant). Following contrast the major dural venous sinuses are enhancing and appear to be patent. Skull and upper cervical spine: Heterogeneous bone marrow signal in the visible upper cervical spine, but no destructive or definitely enhancing osseous lesion is identified. Negative visible cervical spinal cord. Sinuses/Orbits: Negative orbits. Paranasal sinuses and mastoids are stable and well aerated. Other: Visible internal auditory structures appear normal. IMPRESSION: 1. Positive for subcentimeter non-enhancing focus of abnormal diffusion in the left frontal lobe. Uvaldo Garfinkel this is a clinically silent small-vessel infarct, rather than tiny metastasis. No associated hemorrhage or mass effect. Recommend repeat MRI without and with contrast in 3 months to re-evaluate. 2. Otherwise negative for age MRI appearance of the Brain. Heterogeneous but probably benign bone marrow signal in the visible cervical spine. Electronically Signed: By: Marlise Simpers M.D. On: 02/01/2024 11:45   CT Chest W Contrast Result Date: 01/20/2024 CLINICAL DATA:  History of cough and abnormal consolidation in the right upper lobe. EXAM: CT CHEST WITH CONTRAST TECHNIQUE: Multidetector CT imaging of the chest was performed during intravenous contrast administration. RADIATION DOSE REDUCTION: This exam was performed according to the departmental dose-optimization program which includes automated exposure control, adjustment of the mA and/or kV according to patient size and/or use of iterative reconstruction technique. CONTRAST:  50mL OMNIPAQUE  IOHEXOL  350 MG/ML SOLN COMPARISON:  Chest x-ray from earlier in the same day. FINDINGS: Cardiovascular: Thoracic aorta demonstrates atherosclerotic calcifications. No large central pulmonary embolus is identified although timing was not performed for embolus evaluation. Some mild attenuation of the right  pulmonary arterial branches is seen no coronary calcifications are noted. No cardiac enlargement is noted. Mediastinum/Nodes: Thoracic inlet is within normal limits. The esophagus is within normal limits. Right hilar and suprahilar adenopathy is identified with central necrosis highly suspicious for metastatic involvement. The largest of these is noted in the right perihilar region measuring 3.8 x 2.4 cm best seen on image number 80 of series 3. A precarinal node with central necrosis is noted measuring 2.0 cm in short axis. The esophagus as visualized is within normal limits. Lungs/Pleura: Left lung shows no acute infiltrate or effusion. Mild emphysematous changes are noted in the apex. Similar emphysematous changes are noted in the right apex. A large centrally necrotic mass lesion is identified which measures at least  4.9 x 4.7 cm in transverse and AP dimensions respectively. This along with the necrotic adenopathy centrally is consistent with primary pulmonary neoplasm till proven otherwise. Surrounding micro nodular changes are noted likely representing some local spread. Some branching centrally necrotic density is noted particularly in the right upper lobe peripherally suspicious for neoplastic involvement the bronchial tree. Upper Abdomen: Simple cyst is noted in the midportion of the right lobe of the liver. No other focal abnormality is noted. Musculoskeletal: No chest wall abnormality. No acute or significant osseous findings. IMPRESSION: Changes consistent with large right upper lobe necrotic mass lesion with associated necrotic hilar and mediastinal adenopathy. There is also some suggestion of ingrowth into the bronchial tree of the right upper lobe. Peripheral nodular changes are noted also suspicious for local invasion. Referral to multi disciplinary pulmonary team is recommended. Tissue sampling is recommended as well. Aortic Atherosclerosis (ICD10-I70.0) and Emphysema (ICD10-J43.9). Electronically  Signed   By: Violeta Grey M.D.   On: 01/20/2024 22:54   DG Chest 2 View Result Date: 01/20/2024 CLINICAL DATA:  Coughing for 3 months EXAM: CHEST - 2 VIEW COMPARISON:  None Available. FINDINGS: Area of consolidation along the inferior right upper lobe. Question enlargement of the right lung hilum as well. No pneumothorax, effusion or edema. Normal cardiopericardial silhouette. IMPRESSION: Consolidation in the inferior right upper lobe with some fullness of the right lung hilum. Please correlate for any clinical evidence of infection or infiltrate. However with the history of 3 months, a different pathology would be possible and would recommend a contrast CT scan of the chest to further delineate etiology 1 clinically appropriate. Electronically Signed   By: Adrianna Horde M.D.   On: 01/20/2024 14:58    ASSESSMENT: This is a very pleasant 63 years old African-American male recently diagnosed with stage IV (t2b, N3, M1 B) non-small cell lung cancer presented with large right upper lobe lung mass in addition to right hilar, subcarinal, right paratracheal, right internal mammary as well as right supraclavicular lymphadenopathy in addition to metastatic bone lesion involving the right iliac bone diagnosed in April 2025. Molecular studies are still pending.   PLAN: I had a lengthy discussion with the patient and his wife today about his current disease stage, prognosis and treatment options.  I personally and independently reviewed the imaging studies and discussed the result and showed the images to the patient and his wife. Assessment and Plan    Non-small cell lung cancer, stage 4 (T2b, N3, M1b) Stage 4 NSCLC with a large necrotic mass in the right upper lobe, right hilar and mediastinal lymph nodes, and metastasis to the right iliac bone. The cancer is not specified as adenocarcinoma or squamous cell carcinoma. The disease is palliative with no surgical options. Radiation therapy is planned to open the  airway and alleviate symptoms. Molecular marker testing is underway to determine eligibility for targeted therapy. If markers are present, targeted therapy will be preferred over chemotherapy and immunotherapy. If no markers are detected, a combination of carboplatin-based chemotherapy and immunotherapy will be considered. The goal is to extend life and improve quality of life, acknowledging that the cancer is not curable. Discussed potential side effects of treatment, including nausea, vomiting, fatigue, and blood count suppression. Emphasized the importance of maintaining weight and staying active. Informed that the average survival with chemotherapy and immunotherapy is approximately two years, with 50% of patients living longer and 50% living less. If molecular markers are present, survival could be extended further with targeted therapy. Alternative  option of palliative care and hospice was discussed, but treatment is preferred to potentially extend life. - Initiate short course of palliative radiation therapy to open airway. - Send tumor for molecular marker testing. - Administer B12 injection today. - Prescribe folic acid  daily. - Prescribe nausea medication for home use. - Refer to palliative care team for pain management. - Refer to pulmonary medicine for inhaler management. - Delay port placement until molecular marker results are available. - Plan to start treatment in approximately two and a half weeks, pending marker results.  Depression Depression potentially exacerbated by current cancer diagnosis and treatment.  Prostate cancer Prostate cancer treated with surgery in 2022. No current issues related to prostate cancer.  Goals of Care Discussed prognosis of stage 4 NSCLC and the palliative nature of treatment. Emphasized that the goal is to extend life and improve quality of life, not to cure the cancer. He and his family understand the prognosis and prefer to pursue treatment to  potentially extend life, while acknowledging the limitations and side effects. He is hopeful for the presence of molecular markers to allow for targeted therapy, which could further extend survival.   He is expected to start the first cycle of systemic chemotherapy with carboplatin for AUC of 5, Alimta 500 Mg/M2 and Keytruda 200 Mg IV every 3 weeks in around 2-3 weeks after completion of the palliative radiotherapy as well as the availability of the molecular studies. The patient was advised to call immediately if he has any concerning symptoms in the interval. The patient voices understanding of current disease status and treatment options and is in agreement with the current care plan.  All questions were answered. The patient knows to call the clinic with any problems, questions or concerns. We can certainly see the patient much sooner if necessary.  Thank you so much for allowing me to participate in the care of [name]. I will continue to follow up the patient with you and assist in his care.  The total time spent in the appointment was 90 minutes.  Disclaimer: This note was dictated with voice recognition software. Similar sounding words can inadvertently be transcribed and may not be corrected upon review.   Aurelio Blower Feb 07, 2024, 12:11 PM

## 2024-02-08 ENCOUNTER — Encounter: Payer: Self-pay | Admitting: Internal Medicine

## 2024-02-08 ENCOUNTER — Other Ambulatory Visit: Payer: Self-pay

## 2024-02-08 ENCOUNTER — Telehealth: Payer: Self-pay | Admitting: Internal Medicine

## 2024-02-08 ENCOUNTER — Ambulatory Visit
Admission: RE | Admit: 2024-02-08 | Discharge: 2024-02-08 | Disposition: A | Discharge status: Home / Self Care | Source: Ambulatory Visit | Attending: Radiation Oncology | Admitting: Radiation Oncology

## 2024-02-08 DIAGNOSIS — Z510 Encounter for antineoplastic radiation therapy: Secondary | ICD-10-CM | POA: Diagnosis not present

## 2024-02-08 LAB — RAD ONC ARIA SESSION SUMMARY
Course Elapsed Days: 0
Plan Fractions Treated to Date: 1
Plan Prescribed Dose Per Fraction: 3 Gy
Plan Total Fractions Prescribed: 10
Plan Total Prescribed Dose: 30 Gy
Reference Point Dosage Given to Date: 3 Gy
Reference Point Session Dosage Given: 3 Gy
Session Number: 1

## 2024-02-08 NOTE — Telephone Encounter (Signed)
 Scheduled patient education and first appointments with the patient. He is aware and is active on MyChart.

## 2024-02-08 NOTE — Progress Notes (Signed)
 [facility] order confirmation [phone]

## 2024-02-08 NOTE — Telephone Encounter (Addendum)
 Copied from CRM [id number]. Topic: Clinical - Prescription Issue >> Feb 07, 2024  1:33 PM Crist Dominion wrote: Reason for CRM: Patients wife is requesting an update on the cough medication and cough syrup refill that was requested on Friday 5/2, advised patients wife of the 3 business day refill timeline policy but was declining to accept that and is requesting to speak with a nurse.  PER TRIAGE NOTE 02/04/24: Patient and wife calling about patient's cough medication. Patient has run out of Tessalon  Pearles and is out of Cough Syrup. Wife is on the phone and states that after taking a dose of cough syrup, typically is taking another dose within 20-30 minutes. Patient endorses a chronic cough that isn't responding to medication like it was. Some shortness of breath but no increases. Patient and wife instructed at this time to get seen in Urgent Care for medication refills. Discussed the concern that patient is taking more of cough syrup then is prescribed. Patient will need a follow up call on his cough medication. Discussed with wife the possible need for ED visit over the weekend. Wife states that she will take the patient to the ED if need be but will take him to Urgent care at this time.

## 2024-02-09 ENCOUNTER — Other Ambulatory Visit: Payer: Self-pay

## 2024-02-09 ENCOUNTER — Ambulatory Visit
Admission: RE | Admit: 2024-02-09 | Discharge: 2024-02-09 | Disposition: A | Discharge status: Home / Self Care | Source: Ambulatory Visit | Attending: Radiation Oncology | Admitting: Radiation Oncology

## 2024-02-09 ENCOUNTER — Other Ambulatory Visit: Payer: Self-pay | Admitting: Acute Care

## 2024-02-09 DIAGNOSIS — R052 Subacute cough: Secondary | ICD-10-CM

## 2024-02-09 DIAGNOSIS — Z510 Encounter for antineoplastic radiation therapy: Secondary | ICD-10-CM | POA: Diagnosis not present

## 2024-02-09 LAB — RAD ONC ARIA SESSION SUMMARY
Course Elapsed Days: 1
Plan Fractions Treated to Date: 2
Plan Prescribed Dose Per Fraction: 3 Gy
Plan Total Fractions Prescribed: 10
Plan Total Prescribed Dose: 30 Gy
Reference Point Dosage Given to Date: 6 Gy
Reference Point Session Dosage Given: 3 Gy
Session Number: 2

## 2024-02-09 MED ORDER — BENZONATATE 100 MG PO CAPS: 200.0000 mg | ORAL_CAPSULE | Freq: Four times a day (QID) | ORAL | 1 refills | Status: DC | PRN

## 2024-02-09 NOTE — Telephone Encounter (Signed)
**Note De-identified  Woolbright Obfuscation** Please advise 

## 2024-02-09 NOTE — Telephone Encounter (Signed)
 Jeff Romero. These meds were already sent in and pt is taking extra doses of the cough syrup.

## 2024-02-10 ENCOUNTER — Other Ambulatory Visit (HOSPITAL_COMMUNITY)

## 2024-02-10 ENCOUNTER — Ambulatory Visit
Admission: RE | Admit: 2024-02-10 | Discharge: 2024-02-10 | Disposition: A | Discharge status: Home / Self Care | Source: Ambulatory Visit | Attending: Radiation Oncology | Admitting: Radiation Oncology

## 2024-02-10 ENCOUNTER — Other Ambulatory Visit: Payer: Self-pay

## 2024-02-10 ENCOUNTER — Ambulatory Visit (HOSPITAL_COMMUNITY)

## 2024-02-10 DIAGNOSIS — I2699 Other pulmonary embolism without acute cor pulmonale: Secondary | ICD-10-CM | POA: Diagnosis not present

## 2024-02-10 LAB — RAD ONC ARIA SESSION SUMMARY
Course Elapsed Days: 2
Plan Fractions Treated to Date: 3
Plan Prescribed Dose Per Fraction: 3 Gy
Plan Total Fractions Prescribed: 10
Plan Total Prescribed Dose: 30 Gy
Reference Point Dosage Given to Date: 9 Gy
Reference Point Session Dosage Given: 3 Gy
Session Number: 3

## 2024-02-10 NOTE — Telephone Encounter (Signed)
**Note De-identified  Woolbright Obfuscation** Please advise 

## 2024-02-11 ENCOUNTER — Ambulatory Visit
Admission: RE | Admit: 2024-02-11 | Discharge: 2024-02-11 | Disposition: A | Discharge status: Home / Self Care | Source: Ambulatory Visit | Attending: Radiation Oncology

## 2024-02-11 ENCOUNTER — Telehealth: Payer: Self-pay | Admitting: *Deleted

## 2024-02-11 ENCOUNTER — Ambulatory Visit: Payer: Self-pay | Admitting: Acute Care

## 2024-02-11 ENCOUNTER — Other Ambulatory Visit: Payer: Self-pay | Admitting: *Deleted

## 2024-02-11 ENCOUNTER — Ambulatory Visit: Payer: Self-pay

## 2024-02-11 ENCOUNTER — Other Ambulatory Visit: Payer: Self-pay

## 2024-02-11 DIAGNOSIS — R051 Acute cough: Secondary | ICD-10-CM

## 2024-02-11 LAB — RAD ONC ARIA SESSION SUMMARY
Course Elapsed Days: 3
Plan Fractions Treated to Date: 4
Plan Prescribed Dose Per Fraction: 3 Gy
Plan Total Fractions Prescribed: 10
Plan Total Prescribed Dose: 30 Gy
Reference Point Dosage Given to Date: 12 Gy
Reference Point Session Dosage Given: 3 Gy
Session Number: 4

## 2024-02-11 NOTE — Telephone Encounter (Signed)
**Note De-identified  Woolbright Obfuscation** Please advise 

## 2024-02-11 NOTE — Telephone Encounter (Signed)
  Chief Complaint: Coughing  Symptoms: Cough, Constant  Frequency: Acute  Disposition: [x]  Follow-up with Pulmonology  Additional Notes: The patient is out of these medications and they need to be filled as soon as possible. The patient's wife is calling on his behalf.   Ibuprofen  and Guaifenesin  Cough Syrup was originally ordered by Dr. Fulton Job with Pulmonology, ordered again by Dara Ear and the Urgent Care due to a previous symptomatic visit, because the patient was out of the medication. Needs an URGENT refill.   Call the patient back for any other needs at [phone]  Contact the spouse, Jeff Romero [phone] if you are unable to reach [name]  Answer Assessment - Initial Assessment Questions 1. DRUG NAME: "What medicine do you need to have refilled?"     Ibuprofen , Guaifenesin  Cough Syrup  2. REFILLS REMAINING: "How many refills are remaining?" (Note: The label on the medicine or pill bottle will show how many refills are remaining. If there are no refills remaining, then a renewal may be needed.)     Unsure  3. EXPIRATION DATE: "What is the expiration date?" (Note: The label states when the prescription will expire, and thus can no longer be refilled.)     Unsure  4. PRESCRIBING HCP: "Who prescribed it?" Reason: If prescribed by specialist, call should be referred to that group.     Dara Ear- Cough Syrup     Dr. Fulton Job - Ibuprofen  600 mg, initially.  5. SYMPTOMS: "Do you have any symptoms?"     Constant Coughing  Protocols used: Medication Refill and Renewal Call-A-AH

## 2024-02-11 NOTE — Telephone Encounter (Unsigned)
 Copied from CRM [id number]. Topic: Clinical - Medication Refill >> Feb 11, 2024 10:31 AM [name] wrote: Medication:  guaiFENesin -codeine  100-10 MG/5ML syrup    Has the patient contacted their pharmacy? Yes; pt does not have refills available. Dara Ear stated to let her know when the pt ran out. Pt stated he ran out on 02/09/2024. (Agent: If no, request that the patient contact the pharmacy for the refill. If patient does not wish to contact the pharmacy document the reason why and proceed with request.) (Agent: If yes, when and what did the pharmacy advise?)  This is the patient's preferred pharmacy:  [facility] [address], [place] - [address] [place] [address] Phone: [phone] Fax: [phone]  Is this the correct pharmacy for this prescription? Yes If no, delete pharmacy and type the correct one.   Has the prescription been filled recently? Yes  Is the patient out of the medication? Yes  Has the patient been seen for an appointment in the last year OR does the patient have an upcoming appointment? Yes  Can we respond through MyChart? Yes  Agent: Please be advised that Rx refills may take up to 3 business days. We ask that you follow-up with your pharmacy.

## 2024-02-11 NOTE — Telephone Encounter (Signed)
 Copied from CRM [id number]. Topic: Referral - Request for Referral >> Feb 11, 2024 10:35 AM [name] wrote: Did the patient discuss referral with their provider in the last year? Yes (If No - schedule appointment) (If Yes - send message)  Appointment offered? No  Type of order/referral and detailed reason for visit: Pt is needing a referral to a new oncologist within [facility]).  Preference of office, provider, location: Pt is needing a referral to a new oncologist within [facility]).   If referral order, have you been seen by this specialty before? Yes; pt was referred to Dr. Selinda Dales at [facility]  (If Yes, this issue or another issue? When? Where?  Can we respond through MyChart? Yes  Please advise Jeff Romero

## 2024-02-11 NOTE — Telephone Encounter (Signed)
 Request has already been forwarded to the office   Copied from CRM [id number]. Topic: Clinical - Prescription Issue >> Feb 11, 2024  1:36 PM Hilton Lucky wrote: Reason for CRM: [name] (pt's wife) is calling in to check in on refills for guaiFENesin -codeine  100-10 MG/5ML syrup and ibuprofen  (ADVIL ) 600 MG tablet. Several refill requests have been sent in today, though appears to be relevant to previous issue.   Previous addressment from NP Groce states to call in 2-3 days before patient runs out. However, patient has been out of both of these medications for two days, going on three. Patient's spouse reports taking recommended dosage at recommended times for both these medicines.  Patient's spouse states patient has also been taking pearls two at a time, as she states was advised to take 200mg  each time. States will need a refill of these soon as well.   Patient's spouse is very concerned he will be without his medicine and is requesting the ADVIL  and syrup be sent in before day's end so that patient does not have to continue to go without.

## 2024-02-11 NOTE — Telephone Encounter (Unsigned)
 Copied from CRM [id number]. Topic: Clinical - Medication Refill >> Feb 11, 2024 10:41 AM [name] wrote: Medication: ibuprofen  (ADVIL ) 600 MG tablet   Has the patient contacted their pharmacy? Yes; pt does not have any refills left  (Agent: If no, request that the patient contact the pharmacy for the refill. If patient does not wish to contact the pharmacy document the reason why and proceed with request.) (Agent: If yes, when and what did the pharmacy advise?)  This is the patient's preferred pharmacy:  [facility] [address] [place], [place] - [address] [place] [address] Phone: [phone] Fax: [phone]  Is this the correct pharmacy for this prescription? Yes If no, delete pharmacy and type the correct one.   Has the prescription been filled recently? Yes  Is the patient out of the medication? Yes  Has the patient been seen for an appointment in the last year OR does the patient have an upcoming appointment? Yes  Can we respond through MyChart? Yes  Agent: Please be advised that Rx refills may take up to 3 business days. We ask that you follow-up with your pharmacy.

## 2024-02-12 ENCOUNTER — Telehealth (HOSPITAL_COMMUNITY): Payer: Self-pay

## 2024-02-12 ENCOUNTER — Other Ambulatory Visit: Payer: Self-pay

## 2024-02-12 ENCOUNTER — Ambulatory Visit (HOSPITAL_COMMUNITY): Admission: EM | Admit: 2024-02-12 | Discharge: 2024-02-12 | Disposition: A | Discharge status: Home / Self Care

## 2024-02-12 ENCOUNTER — Encounter (HOSPITAL_COMMUNITY): Payer: Self-pay

## 2024-02-12 ENCOUNTER — Emergency Department (HOSPITAL_COMMUNITY)

## 2024-02-12 ENCOUNTER — Encounter (HOSPITAL_COMMUNITY): Payer: Self-pay | Admitting: *Deleted

## 2024-02-12 ENCOUNTER — Inpatient Hospital Stay (HOSPITAL_COMMUNITY)
Admission: EM | Admit: 2024-02-12 | Discharge: 2024-02-15 | DRG: 175 | Disposition: A | Discharge status: Home / Self Care | Attending: Internal Medicine | Admitting: Internal Medicine

## 2024-02-12 DIAGNOSIS — C7951 Secondary malignant neoplasm of bone: Secondary | ICD-10-CM | POA: Diagnosis present

## 2024-02-12 DIAGNOSIS — J189 Pneumonia, unspecified organism: Secondary | ICD-10-CM | POA: Diagnosis present

## 2024-02-12 DIAGNOSIS — K219 Gastro-esophageal reflux disease without esophagitis: Secondary | ICD-10-CM | POA: Diagnosis present

## 2024-02-12 DIAGNOSIS — R918 Other nonspecific abnormal finding of lung field: Secondary | ICD-10-CM | POA: Diagnosis present

## 2024-02-12 DIAGNOSIS — C779 Secondary and unspecified malignant neoplasm of lymph node, unspecified: Secondary | ICD-10-CM | POA: Diagnosis present

## 2024-02-12 DIAGNOSIS — Z811 Family history of alcohol abuse and dependence: Secondary | ICD-10-CM

## 2024-02-12 DIAGNOSIS — Z7951 Long term (current) use of inhaled steroids: Secondary | ICD-10-CM

## 2024-02-12 DIAGNOSIS — E785 Hyperlipidemia, unspecified: Secondary | ICD-10-CM | POA: Diagnosis present

## 2024-02-12 DIAGNOSIS — Z9079 Acquired absence of other genital organ(s): Secondary | ICD-10-CM

## 2024-02-12 DIAGNOSIS — I2699 Other pulmonary embolism without acute cor pulmonale: Secondary | ICD-10-CM | POA: Diagnosis not present

## 2024-02-12 DIAGNOSIS — R051 Acute cough: Secondary | ICD-10-CM

## 2024-02-12 DIAGNOSIS — Z87891 Personal history of nicotine dependence: Secondary | ICD-10-CM

## 2024-02-12 DIAGNOSIS — Z832 Family history of diseases of the blood and blood-forming organs and certain disorders involving the immune mechanism: Secondary | ICD-10-CM

## 2024-02-12 DIAGNOSIS — Z8249 Family history of ischemic heart disease and other diseases of the circulatory system: Secondary | ICD-10-CM

## 2024-02-12 DIAGNOSIS — Z7960 Long term (current) use of unspecified immunomodulators and immunosuppressants: Secondary | ICD-10-CM

## 2024-02-12 DIAGNOSIS — C3411 Malignant neoplasm of upper lobe, right bronchus or lung: Secondary | ICD-10-CM | POA: Diagnosis present

## 2024-02-12 DIAGNOSIS — R053 Chronic cough: Secondary | ICD-10-CM

## 2024-02-12 DIAGNOSIS — Z8546 Personal history of malignant neoplasm of prostate: Secondary | ICD-10-CM

## 2024-02-12 DIAGNOSIS — D509 Iron deficiency anemia, unspecified: Secondary | ICD-10-CM | POA: Diagnosis present

## 2024-02-12 DIAGNOSIS — Z79899 Other long term (current) drug therapy: Secondary | ICD-10-CM

## 2024-02-12 DIAGNOSIS — D630 Anemia in neoplastic disease: Secondary | ICD-10-CM | POA: Diagnosis present

## 2024-02-12 DIAGNOSIS — D649 Anemia, unspecified: Secondary | ICD-10-CM | POA: Diagnosis present

## 2024-02-12 DIAGNOSIS — E876 Hypokalemia: Secondary | ICD-10-CM | POA: Diagnosis present

## 2024-02-12 DIAGNOSIS — S39012A Strain of muscle, fascia and tendon of lower back, initial encounter: Secondary | ICD-10-CM

## 2024-02-12 LAB — CBC
HCT: 25.3 % — ABNORMAL LOW (ref 39.0–52.0)
Hemoglobin: 8.1 g/dL — ABNORMAL LOW (ref 13.0–17.0)
MCH: 25.4 pg — ABNORMAL LOW (ref 26.0–34.0)
MCHC: 32 g/dL (ref 30.0–36.0)
MCV: 79.3 fL — ABNORMAL LOW (ref 80.0–100.0)
Platelets: 439 10*3/uL — ABNORMAL HIGH (ref 150–400)
RBC: 3.19 MIL/uL — ABNORMAL LOW (ref 4.22–5.81)
RDW: 14.6 % (ref 11.5–15.5)
WBC: 10 10*3/uL (ref 4.0–10.5)
nRBC: 0 % (ref 0.0–0.2)

## 2024-02-12 LAB — BASIC METABOLIC PANEL WITH GFR
Anion gap: 12 (ref 5–15)
BUN: 15 mg/dL (ref 8–23)
CO2: 20 mmol/L — ABNORMAL LOW (ref 22–32)
Calcium: 8.4 mg/dL — ABNORMAL LOW (ref 8.9–10.3)
Chloride: 104 mmol/L (ref 98–111)
Creatinine, Ser: 0.81 mg/dL (ref 0.61–1.24)
GFR, Estimated: >60 mL/min (ref >60)
Glucose, Bld: 102 mg/dL — ABNORMAL HIGH (ref 70–99)
Potassium: 3.3 mmol/L — ABNORMAL LOW (ref 3.5–5.1)
Sodium: 136 mmol/L (ref 135–145)

## 2024-02-12 LAB — TROPONIN I (HIGH SENSITIVITY)
Troponin I (High Sensitivity): 46 ng/L — ABNORMAL HIGH (ref <18)
Troponin I (High Sensitivity): 41 ng/L — ABNORMAL HIGH (ref <18)

## 2024-02-12 MED ORDER — GUAIFENESIN-CODEINE 100-10 MG/5ML PO SOLN: 10.0000 mL | Freq: Three times a day (TID) | ORAL | 0 refills | Status: DC | PRN

## 2024-02-12 MED ORDER — IBUPROFEN 600 MG PO TABS
600.0000 mg | ORAL_TABLET | Freq: Four times a day (QID) | ORAL | 0 refills | Status: DC | PRN
Start: 2024-02-12 — End: 2024-02-15

## 2024-02-12 MED ORDER — GUAIFENESIN-CODEINE 100-10 MG/5ML PO SOLN: 10.0000 mL | ORAL | 1 refills | Status: DC | PRN

## 2024-02-12 MED ORDER — SODIUM CHLORIDE 0.9% FLUSH
3.0000 mL | Freq: Two times a day (BID) | INTRAVENOUS | Status: DC
  Administered 2024-02-13 – 2024-02-14 (×4): 3 mL via INTRAVENOUS

## 2024-02-12 MED ORDER — ACETAMINOPHEN 325 MG PO TABS
650.0000 mg | ORAL_TABLET | Freq: Four times a day (QID) | ORAL | Status: DC | PRN
Start: 2024-02-12 — End: 2024-02-14

## 2024-02-12 MED ORDER — HEPARIN BOLUS VIA INFUSION
4500.0000 [IU] | Freq: Once | INTRAVENOUS | Status: AC
  Administered 2024-02-12: 4500 [IU] via INTRAVENOUS
  Filled 2024-02-12: qty 4500

## 2024-02-12 MED ORDER — MORPHINE SULFATE (PF) 4 MG/ML IV SOLN
4.0000 mg | Freq: Once | INTRAVENOUS | Status: AC
  Administered 2024-02-12: 4 mg via INTRAVENOUS
  Filled 2024-02-12: qty 1

## 2024-02-12 MED ORDER — DICLOFENAC SODIUM 75 MG PO TBEC: 75.0000 mg | DELAYED_RELEASE_TABLET | Freq: Two times a day (BID) | ORAL | 0 refills | Status: DC

## 2024-02-12 MED ORDER — ROSUVASTATIN CALCIUM 5 MG PO TABS
10.0000 mg | ORAL_TABLET | Freq: Every day | ORAL | Status: DC
  Administered 2024-02-13 – 2024-02-15 (×3): 10 mg via ORAL
  Filled 2024-02-12 (×3): qty 2

## 2024-02-12 MED ORDER — DOXYCYCLINE HYCLATE 100 MG PO TABS
100.0000 mg | ORAL_TABLET | Freq: Two times a day (BID) | ORAL | Status: DC
  Administered 2024-02-13 – 2024-02-14 (×3): 100 mg via ORAL
  Filled 2024-02-12 (×3): qty 1

## 2024-02-12 MED ORDER — GUAIFENESIN-CODEINE 100-10 MG/5ML PO SOLN
10.0000 mL | Freq: Three times a day (TID) | ORAL | Status: DC | PRN
  Administered 2024-02-13 (×2): 10 mL via ORAL
  Filled 2024-02-12 (×2): qty 10

## 2024-02-12 MED ORDER — DOXYCYCLINE HYCLATE 100 MG PO TABS
100.0000 mg | ORAL_TABLET | Freq: Once | ORAL | Status: AC
  Administered 2024-02-12: 100 mg via ORAL
  Filled 2024-02-12: qty 1

## 2024-02-12 MED ORDER — BACLOFEN 20 MG PO TABS: 20.0000 mg | ORAL_TABLET | Freq: Three times a day (TID) | ORAL | 0 refills | Status: DC

## 2024-02-12 MED ORDER — OXYCODONE-ACETAMINOPHEN 5-325 MG PO TABS
1.0000 | ORAL_TABLET | Freq: Once | ORAL | Status: AC
  Administered 2024-02-12: 1 via ORAL
  Filled 2024-02-12: qty 1

## 2024-02-12 MED ORDER — HYDROCODONE-ACETAMINOPHEN 5-325 MG PO TABS
1.0000 | ORAL_TABLET | ORAL | Status: DC | PRN
  Administered 2024-02-13: 2 via ORAL
  Administered 2024-02-13: 1 via ORAL
  Filled 2024-02-12: qty 1
  Filled 2024-02-12: qty 2

## 2024-02-12 MED ORDER — ONDANSETRON HCL 4 MG/2ML IJ SOLN
4.0000 mg | Freq: Once | INTRAMUSCULAR | Status: AC
  Administered 2024-02-12: 4 mg via INTRAVENOUS
  Filled 2024-02-12: qty 2

## 2024-02-12 MED ORDER — IOHEXOL 350 MG/ML SOLN
75.0000 mL | Freq: Once | INTRAVENOUS | Status: AC | PRN
  Administered 2024-02-12: 75 mL via INTRAVENOUS

## 2024-02-12 MED ORDER — GUAIFENESIN ER 600 MG PO TB12: 600.0000 mg | ORAL_TABLET | Freq: Two times a day (BID) | ORAL | Status: DC | PRN

## 2024-02-12 MED ORDER — POTASSIUM CHLORIDE 20 MEQ PO PACK
40.0000 meq | PACK | Freq: Once | ORAL | Status: AC
Start: 2024-02-12 — End: 2024-02-12
  Administered 2024-02-12: 40 meq via ORAL
  Filled 2024-02-12: qty 2

## 2024-02-12 MED ORDER — ACETAMINOPHEN 650 MG RE SUPP: 650.0000 mg | Freq: Four times a day (QID) | RECTAL | Status: DC | PRN

## 2024-02-12 MED ORDER — SODIUM CHLORIDE 0.9 % IV SOLN
2.0000 g | INTRAVENOUS | Status: DC
  Administered 2024-02-13: 2 g via INTRAVENOUS
  Filled 2024-02-12: qty 20

## 2024-02-12 MED ORDER — SODIUM CHLORIDE 0.9 % IV SOLN
1.0000 g | Freq: Once | INTRAVENOUS | Status: AC
  Administered 2024-02-12: 1 g via INTRAVENOUS
  Filled 2024-02-12: qty 10

## 2024-02-12 MED ORDER — SENNOSIDES-DOCUSATE SODIUM 8.6-50 MG PO TABS: 1.0000 | ORAL_TABLET | Freq: Every evening | ORAL | Status: DC | PRN

## 2024-02-12 MED ORDER — ONDANSETRON HCL 4 MG/2ML IJ SOLN
4.0000 mg | Freq: Four times a day (QID) | INTRAMUSCULAR | Status: DC | PRN
  Administered 2024-02-13: 4 mg via INTRAVENOUS
  Filled 2024-02-12: qty 2

## 2024-02-12 MED ORDER — ALBUTEROL SULFATE (2.5 MG/3ML) 0.083% IN NEBU: 2.5000 mg | INHALATION_SOLUTION | Freq: Four times a day (QID) | RESPIRATORY_TRACT | Status: DC | PRN

## 2024-02-12 MED ORDER — BISACODYL 5 MG PO TBEC: 5.0000 mg | DELAYED_RELEASE_TABLET | Freq: Every day | ORAL | Status: DC | PRN

## 2024-02-12 MED ORDER — HEPARIN (PORCINE) 25000 UT/250ML-% IV SOLN
1600.0000 [IU]/h | INTRAVENOUS | Status: AC
  Administered 2024-02-12: 1000 [IU]/h via INTRAVENOUS
  Administered 2024-02-14: 1600 [IU]/h via INTRAVENOUS
  Filled 2024-02-12 (×3): qty 250

## 2024-02-12 MED ORDER — ONDANSETRON HCL 4 MG PO TABS: 4.0000 mg | ORAL_TABLET | Freq: Four times a day (QID) | ORAL | Status: DC | PRN

## 2024-02-12 MED ORDER — PANTOPRAZOLE SODIUM 40 MG PO TBEC
40.0000 mg | DELAYED_RELEASE_TABLET | Freq: Every day | ORAL | Status: DC
Start: 2024-02-13 — End: 2024-02-15
  Administered 2024-02-13 – 2024-02-15 (×3): 40 mg via ORAL
  Filled 2024-02-12 (×3): qty 1

## 2024-02-12 NOTE — H&P (Signed)
 History and Physical    Jeff Romero ZOX:[id number] DOB: 07/19/1961 DOA: 02/12/2024  [facility]: [facility]  Patient coming from: Home  I have personally briefly reviewed patient's old medical records in [facility]  Chief Complaint: Pleuritic chest and back pain  HPI: Jeff Romero is a 63 y.o. 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  Patient reports new onset right upper chest and right upper back pain developing yesterday.  This occurred when he was working in the garden and moving a heavy bag.  Pain has been frequent since then and worse with deep inspiration.  He has chronic cough occasionally productive of white sputum which is unchanged.  He did note a small spotting of blood in the sputum last week but no other obvious bleeding.  He has not seen any swelling in his lower extremities.  He has not had any pain in his legs or calves.  ED Course  Labs/Imaging on admission: I have personally reviewed following labs and imaging studies.  Initial vitals showed BP 126/79, pulse 124, RR 18, temp 99.3 F, SpO2 97% on room air.  Labs show WBC 10.0, hemoglobin 8.1, platelets 439, sodium 136, potassium 3.3, bicarb 20, BUN 15, creatinine 0.81, serum glucose 102, troponin 46 > 41.  CTA chest showed significant bilateral 2nd and 3rd order pulmonary emboli.  No findings for right heart strain.  Large necrotic right upper lobe lung mass appears relatively stable.  Stable mediastinal and hilar adenopathy.  New small right pleural effusion.  Right lower lobe airspace opacity, likely infiltrate.  New/enlarging right lower lobe and left lower lobe nodules.  No significant upper abdominal findings.  Patient was given IV ceftriaxone, oral doxycycline , IV morphine , and  started on IV heparin drip.  The hospitalist service was consulted to admit.  Review of Systems: All systems reviewed and are negative except as documented in history of present illness above.   Past Medical History:  Diagnosis Date   Allergy    Anxiety    Arthritis    Blood transfusion without reported diagnosis    Cataract    Depression    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   Prostate cancer [facility])     Past Surgical History:  Procedure Laterality Date   BRONCHIAL BIOPSY  01/21/2024   Procedure: BRONCHOSCOPY, WITH BIOPSY;  Surgeon: Joesph Mussel, DO;  Location: [facility] ENDOSCOPY;  Service: Cardiopulmonary;;   BRONCHIAL BRUSHINGS  01/21/2024   Procedure: BRONCHOSCOPY, WITH BRUSH BIOPSY;  Surgeon: Joesph Mussel, DO;  Location: [facility] ENDOSCOPY;  Service: Cardiopulmonary;;   FLEXIBLE BRONCHOSCOPY N/A 01/21/2024   Procedure: Cordella Deter;  Surgeon: Joesph Mussel, DO;  Location: [facility] ENDOSCOPY;  Service: Cardiopulmonary;  Laterality: N/A;   INGUINAL HERNIA REPAIR Right 09/18/2021   Procedure: OPEN RIGHT INGUINAL HERNIA REPAIR WITH MESH;  Surgeon: Kinsinger, Alphonso Aschoff, MD;  Location: [facility] ORS;  Service: General;  Laterality: Right;   KNEE CARTILAGE SURGERY Right    PROSTATE SURGERY     ROTATOR CUFF REPAIR Right     Social History: Social History   Tobacco Use   Smoking status: Former    Types: E-cigarettes    Quit date: 05/09/2014    Years since quitting: 9.7   Smokeless tobacco: Never   Tobacco comments:  Quit 5 years ago   Vaping Use   Vaping status: Former   Quit date: 12/21/2023  Substance Use Topics   Alcohol use: Yes    Alcohol/week: 6.0 standard drinks of alcohol    Types: 6 Cans of beer per week    Comment: every other day   Drug use: No   No Known Allergies  Family History  Problem Relation Age of Onset   Sickle cell anemia Mother    Cirrhosis Mother    Alcohol abuse Mother    Bone cancer Father    Hypertension Sister     Hypertension Brother    Colon cancer Neg Hx    Esophageal cancer Neg Hx    Rectal cancer Neg Hx    Stomach cancer Neg Hx      Prior to Admission medications   Medication Sig Start Date End Date Taking? Authorizing Provider  acetaminophen  (TYLENOL ) 325 MG tablet Take 2 tablets (650 mg total) by mouth every 6 (six) hours as needed for mild pain (pain score 1-3) (or Fever >/= 101). 01/22/24  Yes Sheikh, Omair Latif, DO  albuterol (VENTOLIN HFA) 108 (90 Base) MCG/ACT inhaler Inhale 1 puff into the lungs every 6 (six) hours as needed for wheezing or shortness of breath. 12/23/23  Yes [provider]  baclofen (LIORESAL) 20 MG tablet Take 1 tablet (20 mg total) by mouth 3 (three) times daily. 02/12/24  Yes Reddick, Johnathan B, NP  benzonatate  (TESSALON  PERLES) 100 MG capsule Take 2 capsules (200 mg total) by mouth every 6 (six) hours as needed for cough. 02/09/24 02/08/25 Yes Raejean Bullock, NP  benzonatate  (TESSALON ) 100 MG capsule Take 1 capsule (100 mg total) by mouth 3 (three) times daily as needed for cough. 01/29/24  Yes Zelaya, Oscar A, PA-C  desipramine (NORPRAMIN) 25 MG tablet Take 25 mg by mouth daily.   Yes [provider]  diclofenac (VOLTAREN) 75 MG EC tablet Take 1 tablet (75 mg total) by mouth 2 (two) times daily. 02/12/24  Yes Reddick, Johnathan B, NP  fenofibrate 160 MG tablet Take 160 mg by mouth daily.   Yes [provider]  guaiFENesin -codeine  100-10 MG/5ML syrup Take 10 mLs by mouth 3 (three) times daily as needed for cough. 02/12/24  Yes Reddick, Johnathan B, NP  ibuprofen  (ADVIL ) 600 MG tablet Take 1 tablet (600 mg total) by mouth every 6 (six) hours as needed for mild pain (pain score 1-3), headache or moderate pain (pain score 4-6). 02/12/24  Yes Reddick, Johnathan B, NP  megestrol  (MEGACE ) 20 MG tablet Take 2 tablets (40 mg total) by mouth 4 (four) times daily - after meals and at bedtime. 01/31/24  Yes Raejean Bullock, NP  ondansetron  (ZOFRAN ) 4 MG tablet Take  1 tablet (4 mg total) by mouth every 6 (six) hours as needed for nausea. 01/22/24  Yes Sheikh, Omair Latif, DO  OVER THE COUNTER MEDICATION Unknown eye drop and directions   Yes [provider]  pantoprazole  (PROTONIX ) 40 MG tablet Take 1 tablet (40 mg total) by mouth daily. 01/23/24  Yes Sheikh, Omair Latif, DO  prochlorperazine  (COMPAZINE ) 10 MG tablet Take 1 tablet (10 mg total) by mouth every 6 (six) hours as needed for nausea or vomiting. 02/07/24  Yes Marlene Simas, MD  rosuvastatin (CRESTOR) 10 MG tablet Take 10 mg by mouth daily. 12/23/23  Yes [provider]  senna-docusate (SENOKOT-S) 8.6-50 MG tablet Take 1 tablet by mouth at bedtime as needed for mild constipation. 01/22/24  Yes Aura Leeds [place], [place]    Physical Exam: Vitals:   02/12/24 1745 02/12/24 1800 02/12/24 1945 02/12/24 2133  BP: 127/67 120/72 112/87   Pulse: (!) 101 95 88   Resp: 17 14 17    Temp:    99.5 F (37.5 C)  TempSrc:    Oral  SpO2: 96% 96% 97%   Weight:      Height:       Constitutional: Resting in bed with head elevated, NAD, calm Eyes: EOMI, lids and conjunctivae normal ENMT: Mucous membranes are moist. Posterior pharynx clear of any exudate or lesions.Normal dentition.  Neck: normal, supple, no masses. Respiratory: Faint inspiratory crackles left lung base. Normal respiratory effort. No accessory muscle use.  Cardiovascular: Regular rate and rhythm, no murmurs / rubs / gallops. No extremity edema. 2+ pedal pulses. Abdomen: no tenderness, no masses palpated. Musculoskeletal: no clubbing / cyanosis. No joint deformity upper and lower extremities. Good ROM, no contractures. Normal muscle tone.  Skin: no rashes, lesions, ulcers. No induration Neurologic: Sensation intact. Strength 5/5 in all 4.  Psychiatric: Normal judgment and insight. Alert and oriented x 3. Normal mood.   EKG: Personally reviewed. Sinus tachycardia, rate 127, PVCs, LVH.  Rate is faster and PVCs new compared to  prior.  Assessment/Plan Principal Problem:   Bilateral pulmonary embolism (HCC) Active Problems:   Malignant neoplasm of upper lobe of right lung (HCC)   Right lower lobe pulmonary infiltrate   Anemia   Hypokalemia   Jeff Romero is a 63 y.o. 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  Assessment and Plan: Acute bilateral pulmonary emboli: Seen on CT imaging without evidence of right heart strain.  PEs in setting of known malignancy.  He is otherwise hemodynamically stable and saturating well on room air. - Continue IV heparin drip - Obtain echocardiogram - Discontinue Megace  - Supplemental oxygen as needed  Right lower lobe infiltrate: CT with right lower lobe airspace opacity, potentially developing pneumonia.  Will cover with antibiotics, ceftriaxone and doxycycline .  Anemia: Hemoglobin is 8.1 on admission, slightly decreased from recent labs.  He has not seen any obvious bleeding.  Monitor closely while on anticoagulation.  Stage IV RUL lung cancer with osseous metastases: Recent diagnosis.  Undergoing radiation treatment with Dr. Jeryl Moris.  Following with oncology Dr. Marguerita Shih with tentative plans to start chemotherapy on 5/19.  Hypokalemia: Supplemented.   DVT prophylaxis: IV heparin drip Code Status: Full code, confirmed with patient on admission Family Communication: Spouse at bedside Disposition Plan: From home, dispo pending clinical progress Consults called: None Severity of Illness: The appropriate patient status for this patient is OBSERVATION. Observation status is judged to be reasonable and necessary in order to provide the required intensity of service to ensure the patient's safety. The patient's presenting symptoms, physical exam findings, and  initial radiographic and laboratory data in the context of their medical condition is felt to place them at decreased risk for further clinical deterioration. Furthermore, it is anticipated that the patient will be medically stable for discharge from the hospital within 2 midnights of admission.   Edith Gores MD [facility]  If 7PM-7AM, please contact night-coverage [email or web address]  02/12/2024, 11:13 PM

## 2024-02-12 NOTE — ED Triage Notes (Signed)
 The pt is in severe chest pain    he has lung cancer and he is getting radiation  he felt better yesterday and he moved a heavy piece of equipment  he was taken to urgent care this am    he is out of pain med

## 2024-02-12 NOTE — Telephone Encounter (Signed)
 Pt received rx for guaifensein-codeine . [facility] was out of stock; could not transfer. Please send to [facility] on [address]  Thank you.

## 2024-02-12 NOTE — Hospital Course (Signed)
 Jeff Romero is a 63 y.o. 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

## 2024-02-12 NOTE — ED Notes (Signed)
Admit provider at bedside 

## 2024-02-12 NOTE — ED Triage Notes (Signed)
 Patient presenting with right upper back to chest pain onset yesterday. Patient is a cancer Patient and has started having radiation therapy. Pain onset when lifting a bag in the yard. Patient needing a refill of Guaifenesin  and ibuprofen  as the cough is worsening the back and chest pain.  Prescriptions or OTC medications tried: Yes- Ibuprofen  600 mg   with no relief.

## 2024-02-12 NOTE — ED Provider Notes (Signed)
 [facility] [place]  Note:  This document was prepared using Dragon voice recognition software and may include unintentional dictation errors.  MRN: [id number] DOB: October 30, 1960  Subjective:   Jeff Romero is a 63 y.o. male presenting for severe acute onset back pain and chronic right anterior chest pain secondary to lung mass.  Patient reports that he lifted a bag of topsoil in the garden yesterday causing back pain.  Patient reports that he has had several instances of similar back pain over the past couple of years.  Patient reports that anterior chest pain is chronic due to his lung cancer mass.  Patient recently began radiation to his chest for cancer treatment.  Patient has taken ibuprofen  and Tylenol  with minimal improvement to symptoms.  Patient also reports that he has a chronic cough secondary to cancer that he uses guaifenesin  codeine  cough syrup but he has recently run out and needs a refill for prescription.  Patient reports that cough makes his chest and back pain much worse.  Patient denies any other secondary medical concerns at this time.  Patient states that he will follow-up with radiation oncology early next week for radiation therapy.  No current facility-administered medications for this encounter.  Current Outpatient Medications:    acetaminophen  (TYLENOL ) 325 MG tablet, Take 2 tablets (650 mg total) by mouth every 6 (six) hours as needed for mild pain (pain score 1-3) (or Fever >/= 101)., Disp: 20 tablet, Rfl: 0   albuterol (VENTOLIN HFA) 108 (90 Base) MCG/ACT inhaler, Inhale 1 puff into the lungs every 6 (six) hours as needed for wheezing or shortness of breath., Disp: , Rfl:    baclofen (LIORESAL) 20 MG tablet, Take 1 tablet (20 mg total) by mouth 3 (three) times daily., Disp: 30 each, Rfl: 0   benzonatate  (TESSALON  PERLES) 100 MG capsule, Take 2 capsules (200 mg total) by mouth every 6 (six) hours as needed for cough., Disp: 30 capsule, Rfl: 1   benzonatate  (TESSALON )  100 MG capsule, Take 1 capsule (100 mg total) by mouth 3 (three) times daily as needed for cough., Disp: 20 capsule, Rfl: 0   budesonide -formoterol  (SYMBICORT ) 160-4.5 MCG/ACT inhaler, Inhale 2 puffs into the lungs 2 (two) times daily., Disp: 1 each, Rfl: 6   desipramine (NORPRAMIN) 25 MG tablet, Take 25 mg by mouth daily., Disp: , Rfl:    diclofenac (VOLTAREN) 75 MG EC tablet, Take 1 tablet (75 mg total) by mouth 2 (two) times daily., Disp: 30 tablet, Rfl: 0   fenofibrate 160 MG tablet, Take 160 mg by mouth daily., Disp: , Rfl:    folic acid  (FOLVITE ) 1 MG tablet, Take 1 tablet (1 mg total) by mouth daily. Start 7 days before pemetrexed chemotherapy. Continue until 21 days after pemetrexed completed., Disp: 100 tablet, Rfl: 3   megestrol  (MEGACE ) 20 MG tablet, Take 2 tablets (40 mg total) by mouth 4 (four) times daily - after meals and at bedtime., Disp: 60 tablet, Rfl: 1   Multiple Vitamin (MULTIVITAMIN WITH MINERALS) TABS tablet, Take 1 tablet by mouth daily., Disp: , Rfl:    Omega-3 Fatty Acids (FISH OIL) 1000 MG CAPS, Take 1,000 mg by mouth daily., Disp: , Rfl:    ondansetron  (ZOFRAN ) 4 MG tablet, Take 1 tablet (4 mg total) by mouth every 6 (six) hours as needed for nausea., Disp: 20 tablet, Rfl: 0   pantoprazole  (PROTONIX ) 40 MG tablet, Take 1 tablet (40 mg total) by mouth daily., Disp: 30 tablet, Rfl: 0   prochlorperazine  (COMPAZINE )  10 MG tablet, Take 1 tablet (10 mg total) by mouth every 6 (six) hours as needed for nausea or vomiting., Disp: 30 tablet, Rfl: 1   rosuvastatin (CRESTOR) 10 MG tablet, Take 10 mg by mouth daily., Disp: , Rfl:    Biotin 5000 MCG CAPS, Take 5,000 mcg by mouth daily. (Patient not taking: Reported on 01/21/2024), Disp: , Rfl:    Cholecalciferol (VITAMIN D) 50 MCG (2000 UT) tablet, Take 2,000 Units by mouth daily. (Patient not taking: Reported on 01/21/2024), Disp: , Rfl:    ferrous sulfate 325 (65 FE) MG tablet, Take 1 tablet by mouth daily. (Patient not taking:  Reported on 01/21/2024), Disp: , Rfl:    guaiFENesin -codeine  100-10 MG/5ML syrup, Take 10 mLs by mouth every 4 (four) hours as needed for cough., Disp: 237 mL, Rfl: 1   ibuprofen  (ADVIL ) 600 MG tablet, Take 1 tablet (600 mg total) by mouth every 6 (six) hours as needed for mild pain (pain score 1-3), headache or moderate pain (pain score 4-6)., Disp: 20 tablet, Rfl: 0   Misc Natural Products (GINSENG COMPLEX PO), Take 1 capsule by mouth daily. (Patient not taking: Reported on 01/21/2024), Disp: , Rfl:    Potassium Gluconate 2.5 MEQ TABS, Take by mouth. (Patient not taking: Reported on 01/21/2024), Disp: , Rfl:    Saw Palmetto 450 MG CAPS, Take 450 mg by mouth daily. (Patient not taking: Reported on 01/21/2024), Disp: , Rfl:    senna-docusate (SENOKOT-S) 8.6-50 MG tablet, Take 1 tablet by mouth at bedtime as needed for mild constipation. (Patient not taking: Reported on 02/03/2024), Disp: 30 tablet, Rfl: 0   tetrahydrozoline 0.05 % ophthalmic solution, Place 1 drop into both eyes daily. (Patient not taking: Reported on 01/31/2024), Disp: , Rfl:    umeclidinium bromide  (INCRUSE ELLIPTA ) 62.5 MCG/ACT AEPB, Inhale 1 puff into the lungs daily. (Patient not taking: Reported on 02/03/2024), Disp: 30 each, Rfl: 0   No Known Allergies  Past Medical History:  Diagnosis Date   Allergy    Anxiety    Arthritis    Blood transfusion without reported diagnosis    Cataract    Depression    Prostate cancer [facility])      Past Surgical History:  Procedure Laterality Date   BRONCHIAL BIOPSY  01/21/2024   Procedure: BRONCHOSCOPY, WITH BIOPSY;  Surgeon: Joesph Mussel, DO;  Location: [facility];  Service: Cardiopulmonary;;   BRONCHIAL BRUSHINGS  01/21/2024   Procedure: BRONCHOSCOPY, WITH BRUSH BIOPSY;  Surgeon: Joesph Mussel, DO;  Location: [facility];  Service: Cardiopulmonary;;   FLEXIBLE BRONCHOSCOPY N/A 01/21/2024   Procedure: Cordella Deter;  Surgeon: Joesph Mussel, DO;  Location: [facility];  Service:  Cardiopulmonary;  Laterality: N/A;   INGUINAL HERNIA REPAIR Right 09/18/2021   Procedure: OPEN RIGHT INGUINAL HERNIA REPAIR WITH MESH;  Surgeon: Kinsinger, Alphonso Aschoff, MD;  Location: [facility];  Service: General;  Laterality: Right;   KNEE CARTILAGE SURGERY Right    PROSTATE SURGERY     ROTATOR CUFF REPAIR Right     Family History  Problem Relation Age of Onset   Sickle cell anemia Mother    Cirrhosis Mother    Alcohol abuse Mother    Bone cancer Father    Hypertension Sister    Hypertension Brother    Colon cancer Neg Hx    Esophageal cancer Neg Hx    Rectal cancer Neg Hx    Stomach cancer Neg Hx     Social History   Tobacco Use  Smoking status: Former    Types: E-cigarettes    Quit date: 05/09/2014    Years since quitting: 9.7   Smokeless tobacco: Never   Tobacco comments:    Quit 5 years ago   Vaping Use   Vaping status: Former   Quit date: 12/21/2023  Substance Use Topics   Alcohol use: Yes    Alcohol/week: 6.0 standard drinks of alcohol    Types: 6 Cans of beer per week    Comment: every other day   Drug use: No    ROS Refer to HPI for ROS details.  Objective:   Vitals: BP 116/72 (BP Location: Left Arm)   Pulse (!) 111   Temp 97.8 F (36.6 C) (Oral)   Resp 18   SpO2 97%   Physical Exam Vitals and nursing note reviewed.  Constitutional:      General: He is not in acute distress.    Appearance: Normal appearance. He is well-developed. He is not ill-appearing or toxic-appearing.  HENT:     Head: Normocephalic and atraumatic.     Nose: Nose normal.     Mouth/Throat:     Mouth: Mucous membranes are moist.  Eyes:     General:        Right eye: No discharge.        Left eye: No discharge.     Extraocular Movements: Extraocular movements intact.     Conjunctiva/sclera: Conjunctivae normal.  Cardiovascular:     Rate and Rhythm: Normal rate and regular rhythm.  Pulmonary:     Effort: Pulmonary effort is normal. No respiratory distress.     Breath  sounds: Normal breath sounds. No wheezing or rhonchi.  Chest:     Chest wall: Tenderness present.  Musculoskeletal:     Right shoulder: Tenderness present. No swelling, deformity or bony tenderness. Normal range of motion. Decreased strength. Normal pulse.     Cervical back: Normal.     Thoracic back: Normal.     Lumbar back: Normal.  Skin:    General: Skin is warm and dry.  Neurological:     General: No focal deficit present.     Mental Status: He is alert and oriented to person, place, and time.  Psychiatric:        Mood and Affect: Mood normal.        Behavior: Behavior normal.     Procedures  No results found for this or any previous visit (from the past 24 hours).  No results found.   Assessment and Plan :     Discharge Instructions       1. Acute cough - guaiFENesin -codeine  100-10 MG/5ML syrup; Take 10 mLs by mouth every 4 (four) hours as needed for cough.  Dispense: 237 mL; Refill: 1  2. Back strain, initial encounter (Primary) - ibuprofen  (ADVIL ) 600 MG tablet; Take 1 tablet (600 mg total) by mouth every 6 (six) hours as needed for mild pain (pain score 1-3), headache or moderate pain (pain score 4-6).  Dispense: 20 tablet; Refill: 0 - baclofen (LIORESAL) 20 MG tablet; Take 1 tablet (20 mg total) by mouth 3 (three) times daily.  Dispense: 30 each; Refill: 0 - diclofenac (VOLTAREN) 75 MG EC tablet; Take 1 tablet (75 mg total) by mouth 2 (two) times daily.  Dispense: 30 tablet; Refill: 0 -Continue to monitor symptoms for any change in severity if there is any escalation of current symptoms or development of new symptoms follow-up in ER for further evaluation and management.  Nimsi Males B Cashae Weich   Christine Schiefelbein, [place], [place] 02/12/24 [phone]

## 2024-02-12 NOTE — ED Notes (Signed)
 Received report. Pt resting, spouse at bedside. Denies needs. ABCs intact.

## 2024-02-12 NOTE — Telephone Encounter (Signed)
 Patient called to say that [facility] the prescription was sent to does not have prescribed cough syrup.  Requested prescription to be sent to [facility] on [address].  Prescription sent to requested pharmacy.

## 2024-02-12 NOTE — ED Provider Notes (Signed)
  Physical Exam  BP 112/87   Pulse 88   Temp 99.3 F (37.4 C)   Resp 17   Ht 5\' 7"  (1.702 m)   Wt 56.4 kg   SpO2 97%   BMI 19.47 kg/m   Physical Exam  Procedures  Procedures  ED Course / MDM   Clinical Course as of 02/12/24 2155  Sat Feb 12, 2024  2131 Assumed care from Dr Charlee Conine. 63 yo M with hx of lung cancer who has BL PE with elevated troponin.  No right heart strain on imaging.  Currently on room air.  Also had some infiltrates was getting antibiotics. [RP]  2144 Dr Lydia Sams from [facility] to eval for admission. [RP]    Clinical Course User Index [RP] Ninetta Basket, MD   Medical Decision Making Amount and/or Complexity of Data Reviewed Labs: ordered. Radiology: ordered.  Risk Prescription drug management. Decision regarding hospitalization.          Ninetta Basket, MD 02/12/24 2155

## 2024-02-12 NOTE — Telephone Encounter (Signed)
 Attempted to reach patient's wife x1 to inform her that meds had been sent. VM full.

## 2024-02-12 NOTE — Discharge Instructions (Addendum)
  1. Acute cough - guaiFENesin -codeine  100-10 MG/5ML syrup; Take 10 mLs by mouth every 4 (four) hours as needed for cough.  Dispense: 237 mL; Refill: 1  2. Back strain, initial encounter (Primary) - ibuprofen  (ADVIL ) 600 MG tablet; Take 1 tablet (600 mg total) by mouth every 6 (six) hours as needed for mild pain (pain score 1-3), headache or moderate pain (pain score 4-6).  Dispense: 20 tablet; Refill: 0 - baclofen (LIORESAL) 20 MG tablet; Take 1 tablet (20 mg total) by mouth 3 (three) times daily.  Dispense: 30 each; Refill: 0 - diclofenac (VOLTAREN) 75 MG EC tablet; Take 1 tablet (75 mg total) by mouth 2 (two) times daily.  Dispense: 30 tablet; Refill: 0 -Continue to monitor symptoms for any change in severity if there is any escalation of current symptoms or development of new symptoms follow-up in ER for further evaluation and management.

## 2024-02-12 NOTE — Progress Notes (Signed)
 ANTICOAGULATION CONSULT NOTE  Pharmacy Consult for Heparin Indication: pulmonary embolus  No Known Allergies  Patient Measurements: Height: 5\' 7"  (170.2 cm) Weight: 56.4 kg (124 lb 5.4 oz) IBW/kg (Calculated) : 66.1 Heparin Dosing Weight: 56.4 kg  Vital Signs: Temp: 99.3 F (37.4 C) (05/10 1619) Temp Source: Oral (05/10 1030) BP: 120/72 (05/10 1800) Pulse Rate: 95 (05/10 1800)  Labs: Recent Labs    02/12/24 1715 02/12/24 1907  HGB 8.1*  --   HCT 25.3*  --   PLT 439*  --   CREATININE 0.81  --   TROPONINIHS 46* 41*    Estimated Creatinine Clearance: 75.4 mL/min (by C-G formula based on SCr of 0.81 mg/dL).   Medical History: Past Medical History:  Diagnosis Date   Allergy    Anxiety    Arthritis    Blood transfusion without reported diagnosis    Cataract    Depression    Prostate cancer (HCC)     Medications:  (Not in a hospital admission)  Scheduled:  Infusions:  PRN:   Assessment: 52 yom with recently diagnosed stage III lung cancer . Patient is presenting with chest and back pain. Heparin per pharmacy consult placed for pulmonary embolus.  CTA w/ PE and no evidence of RHS  Patient is not on anticoagulation prior to arrival.  Hgb 8.1; plt 439  Goal of Therapy:  Heparin level 0.3-0.7 units/ml Monitor platelets by anticoagulation protocol: Yes   Plan:  Give IV heparin 4500 units bolus x 1 Start heparin infusion at 1000 units/hr Check anti-Xa level at 0400 Daily heparin level while on heparin Continue to monitor H&H and platelets  Dionicio Fray, PharmD, BCPS 02/12/2024 8:43 PM ED Clinical Pharmacist -  [phone]

## 2024-02-12 NOTE — ED Triage Notes (Signed)
 The pt was diagnosed  with the cancer 3 weeks ago here

## 2024-02-12 NOTE — ED Provider Notes (Signed)
  EMERGENCY DEPARTMENT AT [facility] Provider Note   CSN: [id number] Arrival date & time: 02/12/24  1616     History  Chief Complaint  Patient presents with   Chest Pain    Jeff Romero is a 63 y.o. male.  63 year old male with recently diagnosed stage III lung cancer presenting to the emergency department today with chest and back pain.  The patient states that he started with pain in his chest that radiates through to his back that started yesterday after doing some heavy lifting.  The patient went to urgent care this morning.  He has tried over-the-counter medications with no relief.  Pain was getting worse so he came to the ER for further evaluation.  He was reporting some shortness of breath earlier.  He has recently started radiation therapy.  He denies any fevers.  Has had a cough but this has been more of a chronic issue even before he was diagnosed with the lung cancer.  Pain is worse with coughing or movement.   Chest Pain Associated symptoms: cough        Home Medications Prior to Admission medications   Medication Sig Start Date End Date Taking? Authorizing Provider  acetaminophen  (TYLENOL ) 325 MG tablet Take 2 tablets (650 mg total) by mouth every 6 (six) hours as needed for mild pain (pain score 1-3) (or Fever >/= 101). 01/22/24   Sheikh, Omair Latif, DO  albuterol (VENTOLIN HFA) 108 (90 Base) MCG/ACT inhaler Inhale 1 puff into the lungs every 6 (six) hours as needed for wheezing or shortness of breath. 12/23/23   [provider]  baclofen (LIORESAL) 20 MG tablet Take 1 tablet (20 mg total) by mouth 3 (three) times daily. 02/12/24   Reddick, Johnathan B, NP  benzonatate  (TESSALON  PERLES) 100 MG capsule Take 2 capsules (200 mg total) by mouth every 6 (six) hours as needed for cough. 02/09/24 02/08/25  Raejean Bullock, NP  benzonatate  (TESSALON ) 100 MG capsule Take 1 capsule (100 mg total) by mouth 3 (three) times daily as needed for cough. 01/29/24    Zelaya, Oscar A, PA-C  Biotin 5000 MCG CAPS Take 5,000 mcg by mouth daily. Patient not taking: Reported on 01/21/2024    [provider]  budesonide -formoterol  (SYMBICORT ) 160-4.5 MCG/ACT inhaler Inhale 2 puffs into the lungs 2 (two) times daily. 01/31/24   Raejean Bullock, NP  Cholecalciferol (VITAMIN D) 50 MCG (2000 UT) tablet Take 2,000 Units by mouth daily. Patient not taking: Reported on 01/21/2024    [provider]  desipramine (NORPRAMIN) 25 MG tablet Take 25 mg by mouth daily.    [provider]  diclofenac (VOLTAREN) 75 MG EC tablet Take 1 tablet (75 mg total) by mouth 2 (two) times daily. 02/12/24   Reddick, Johnathan B, NP  fenofibrate 160 MG tablet Take 160 mg by mouth daily.    [provider]  ferrous sulfate 325 (65 FE) MG tablet Take 1 tablet by mouth daily. Patient not taking: Reported on 01/21/2024    [provider]  folic acid  (FOLVITE ) 1 MG tablet Take 1 tablet (1 mg total) by mouth daily. Start 7 days before pemetrexed chemotherapy. Continue until 21 days after pemetrexed completed. 02/07/24   Marlene Simas, MD  guaiFENesin -codeine  100-10 MG/5ML syrup Take 10 mLs by mouth 3 (three) times daily as needed for cough. 02/12/24   Reddick, Johnathan B, NP  ibuprofen  (ADVIL ) 600 MG tablet Take 1 tablet (600 mg total) by mouth every 6 (  six) hours as needed for mild pain (pain score 1-3), headache or moderate pain (pain score 4-6). 02/12/24   Reddick, Johnathan B, NP  megestrol  (MEGACE ) 20 MG tablet Take 2 tablets (40 mg total) by mouth 4 (four) times daily - after meals and at bedtime. 01/31/24   Raejean Bullock, NP  Misc Natural Products [facility] COMPLEX PO) Take 1 capsule by mouth daily. Patient not taking: Reported on 01/21/2024    [provider]  Multiple Vitamin (MULTIVITAMIN WITH MINERALS) TABS tablet Take 1 tablet by mouth daily.    [provider]  Omega-3 Fatty Acids (FISH OIL) 1000 MG CAPS Take 1,000 mg by mouth daily.     [provider]  ondansetron  (ZOFRAN ) 4 MG tablet Take 1 tablet (4 mg total) by mouth every 6 (six) hours as needed for nausea. 01/22/24   Aura Leeds Latif, DO  pantoprazole  (PROTONIX ) 40 MG tablet Take 1 tablet (40 mg total) by mouth daily. 01/23/24   Sheikh, Omair Latif, DO  Potassium Gluconate 2.5 MEQ TABS Take by mouth. Patient not taking: Reported on 01/21/2024    [provider]  prochlorperazine  (COMPAZINE ) 10 MG tablet Take 1 tablet (10 mg total) by mouth every 6 (six) hours as needed for nausea or vomiting. 02/07/24   Marlene Simas, MD  rosuvastatin (CRESTOR) 10 MG tablet Take 10 mg by mouth daily. 12/23/23   [provider]  Saw Palmetto 450 MG CAPS Take 450 mg by mouth daily. Patient not taking: Reported on 01/21/2024    [provider]  senna-docusate (SENOKOT-S) 8.6-50 MG tablet Take 1 tablet by mouth at bedtime as needed for mild constipation. Patient not taking: Reported on 02/03/2024 01/22/24   Sheikh, Omair Latif, DO  tetrahydrozoline 0.05 % ophthalmic solution Place 1 drop into both eyes daily. Patient not taking: Reported on 01/31/2024    [provider]  umeclidinium bromide  (INCRUSE ELLIPTA ) 62.5 MCG/ACT AEPB Inhale 1 puff into the lungs daily. Patient not taking: Reported on 02/03/2024 01/23/24   Sheikh, Omair Latif, DO      Allergies    Patient has no known allergies.    Review of Systems   Review of Systems  Respiratory:  Positive for cough.   Cardiovascular:  Positive for chest pain.  All other systems reviewed and are negative.   Physical Exam Updated Vital Signs BP 120/72   Pulse 95   Temp 99.3 F (37.4 C)   Resp 14   Ht 5\' 7"  (1.702 m)   Wt 56.4 kg   SpO2 96%   BMI 19.47 kg/m  Physical Exam Vitals and nursing note reviewed.   Gen: Appears uncomfortable Eyes: PERRL, EOMI HEENT: no oropharyngeal swelling Neck: trachea midline Resp: clear to auscultation bilaterally Card: Tachycardic, no murmurs, rubs, or  gallops Abd: nontender, nondistended Extremities: no calf tenderness, no edema Vascular: 2+ radial pulses bilaterally, 2+ DP pulses bilaterally Skin: no rashes Psyc: acting appropriately   ED Results / Procedures / Treatments   Labs (all labs ordered are listed, but only abnormal results are displayed) Labs Reviewed  BASIC METABOLIC PANEL WITH GFR - Abnormal; Notable for the following components:      Result Value   Potassium 3.3 (*)    CO2 20 (*)    Glucose, Bld 102 (*)    Calcium 8.4 (*)    All other components within normal limits  CBC - Abnormal; Notable for the following components:   RBC 3.19 (*)    Hemoglobin 8.1 (*)  HCT 25.3 (*)    MCV 79.3 (*)    MCH 25.4 (*)    Platelets 439 (*)    All other components within normal limits  TROPONIN I (HIGH SENSITIVITY) - Abnormal; Notable for the following components:   Troponin I (High Sensitivity) 46 (*)    All other components within normal limits  TROPONIN I (HIGH SENSITIVITY) - Abnormal; Notable for the following components:   Troponin I (High Sensitivity) 41 (*)    All other components within normal limits  BRAIN NATRIURETIC PEPTIDE  HEPARIN LEVEL (UNFRACTIONATED)    EKG None  Radiology CT Angio Chest PE W and/or Wo Contrast Result Date: 02/12/2024 CLINICAL DATA:  Shortness of breath.  Lung cancer. EXAM: CT ANGIOGRAPHY CHEST WITH CONTRAST TECHNIQUE: Multidetector CT imaging of the chest was performed using the standard protocol during bolus administration of intravenous contrast. Multiplanar CT image reconstructions and MIPs were obtained to evaluate the vascular anatomy. RADIATION DOSE REDUCTION: This exam was performed according to the departmental dose-optimization program which includes automated exposure control, adjustment of the mA and/or kV according to patient size and/or use of iterative reconstruction technique. CONTRAST:  75mL OMNIPAQUE  IOHEXOL  350 MG/ML SOLN COMPARISON:  Chest CT 01/20/2024 and PET-CT  02/01/2019 FINDINGS: Cardiovascular: The heart is within normal limits in size. No pericardial effusion. Stable fluid in the pericardial recesses. The aorta is normal in caliber. No dissection. Scattered atherosclerotic calcifications. Scattered coronary artery calcifications. The pulmonary arterial tree is well opacified. Significant bilateral second and third order pulmonary emboli. No findings for significant right heart strain. The RV LV ratio is less than 1. Mediastinum/Nodes: Stable mediastinal and hilar adenopathy. The esophagus is grossly normal. Lungs/Pleura: Large necrotic right upper lobe lung mass appears relatively stable. Stable underlying advanced emphysematous changes with areas of pulmonary scarring. New small right pleural effusion. Right lower lobe airspace opacity, likely infiltrate. Suspect new right lower lobe pulmonary nodule on image 105/9 measuring 9 mm. Significant interval enlargement a left lower lobe pulmonary nodule. This measures 9 mm on image 104/9. Upper Abdomen: Stable low-attenuation lesions in the right hepatic lobe consistent with a benign cyst. No adrenal gland lesions or upper abdominal adenopathy. Musculoskeletal: No chest wall abnormality. No acute or significant osseous findings. Review of the MIP images confirms the above findings. IMPRESSION: 1. Significant bilateral second and third order pulmonary emboli. No findings for right heart strain. 2. Large necrotic right upper lobe lung mass appears relatively stable. 3. Stable mediastinal and hilar adenopathy. 4. New small right pleural effusion. 5. Right lower lobe airspace opacity, likely infiltrate. 6. New/enlarging right lower lobe and left lower lobe nodules. 7. No significant upper abdominal findings. Aortic Atherosclerosis (ICD10-I70.0) and Emphysema (ICD10-J43.9). These results were called by telephone at the time of interpretation on 02/12/2024 at 8:36 pm to provider Desia Saban , who verbally acknowledged these  results. Electronically Signed   By: Marrian Siva M.D.   On: 02/12/2024 20:36   DG Chest Port 1 View Result Date: 02/12/2024 CLINICAL DATA:  Chest pain, lung cancer. EXAM: PORTABLE CHEST 1 VIEW COMPARISON:  None Available. FINDINGS: The heart size and mediastinal contours are within normal limits. Right upper lung masslike alveolar process has become more dense as compared to the previous examination. There is interstitial prominence in the right lower lung. Left lung is clear. No pneumothorax or pleural effusion. IMPRESSION: 1. Right upper lung masslike alveolar process has become more dense. 2. Interstitial prominence in the right lower lung. Electronically Signed   By: Melodie Spry  Pleasure M.D.   On: 02/12/2024 20:11    Procedures Procedures    Medications Ordered in ED Medications  oxyCODONE -acetaminophen  (PERCOCET/ROXICET) 5-325 MG per tablet 1 tablet (has no administration in time range)  heparin ADULT infusion 100 units/mL (25000 units/250mL) (has no administration in time range)  heparin bolus via infusion 4,500 Units (has no administration in time range)  morphine  (PF) 4 MG/ML injection 4 mg (4 mg Intravenous Given 02/12/24 1712)  ondansetron  (ZOFRAN ) injection 4 mg (4 mg Intravenous Given 02/12/24 1712)  iohexol  (OMNIPAQUE ) 350 MG/ML injection 75 mL (75 mLs Intravenous Contrast Given 02/12/24 2000)    ED Course/ Medical Decision Making/ A&P                                 Medical Decision Making 63 year old male with recently diagnosed stage III lung cancer presenting to the emergency department today with chest and back pain.  I will further evaluate the patient here with basic labs well as an EKG, chest x-ray, and troponin for further evaluation for ACS, pulmonary edema, pulmonary infiltrates, or pneumothorax.  I will give him morphine  and Zofran  for his symptoms.  With reassuring neurovascular exam and normal blood pressure suspicion for aortic dissection is low at this time.  The  patient is tachycardic here on arrival.  If x-ray is unrevealing will obtain a CT angiogram to evaluate for pulmonary embolism or other acute finding.  I will reevaluate for ultimate disposition.  Patient's labs are reassuring with exception of his troponins which are elevated.  X-ray was unremarkable so CT scan is ordered which does show multiple pulmonary emboli.  The patient started on heparin as his initial hemoglobin to start is 8.0.  A call was placed to the hospitalist service for admission.  Amount and/or Complexity of Data Reviewed Labs: ordered. Radiology: ordered.  Risk Prescription drug management. Decision regarding hospitalization.           Final Clinical Impression(s) / ED Diagnoses Final diagnoses:  Other acute pulmonary embolism without acute cor pulmonale (HCC)    Rx / DC Orders ED Discharge Orders     None         Carin Charleston, MD 02/12/24 2049

## 2024-02-13 ENCOUNTER — Encounter (HOSPITAL_COMMUNITY): Payer: Self-pay | Admitting: Internal Medicine

## 2024-02-13 ENCOUNTER — Observation Stay (HOSPITAL_COMMUNITY)

## 2024-02-13 DIAGNOSIS — Z9079 Acquired absence of other genital organ(s): Secondary | ICD-10-CM | POA: Diagnosis not present

## 2024-02-13 DIAGNOSIS — R918 Other nonspecific abnormal finding of lung field: Secondary | ICD-10-CM | POA: Diagnosis not present

## 2024-02-13 DIAGNOSIS — J189 Pneumonia, unspecified organism: Secondary | ICD-10-CM | POA: Diagnosis present

## 2024-02-13 DIAGNOSIS — D509 Iron deficiency anemia, unspecified: Secondary | ICD-10-CM | POA: Diagnosis present

## 2024-02-13 DIAGNOSIS — E876 Hypokalemia: Secondary | ICD-10-CM | POA: Diagnosis present

## 2024-02-13 DIAGNOSIS — I3139 Other pericardial effusion (noninflammatory): Secondary | ICD-10-CM

## 2024-02-13 DIAGNOSIS — I083 Combined rheumatic disorders of mitral, aortic and tricuspid valves: Secondary | ICD-10-CM

## 2024-02-13 DIAGNOSIS — I2699 Other pulmonary embolism without acute cor pulmonale: Secondary | ICD-10-CM | POA: Diagnosis present

## 2024-02-13 DIAGNOSIS — E785 Hyperlipidemia, unspecified: Secondary | ICD-10-CM | POA: Diagnosis present

## 2024-02-13 DIAGNOSIS — I5030 Unspecified diastolic (congestive) heart failure: Secondary | ICD-10-CM | POA: Diagnosis not present

## 2024-02-13 DIAGNOSIS — C7951 Secondary malignant neoplasm of bone: Secondary | ICD-10-CM | POA: Diagnosis present

## 2024-02-13 DIAGNOSIS — Z79899 Other long term (current) drug therapy: Secondary | ICD-10-CM | POA: Diagnosis not present

## 2024-02-13 DIAGNOSIS — Z87891 Personal history of nicotine dependence: Secondary | ICD-10-CM | POA: Diagnosis not present

## 2024-02-13 DIAGNOSIS — Z8249 Family history of ischemic heart disease and other diseases of the circulatory system: Secondary | ICD-10-CM | POA: Diagnosis not present

## 2024-02-13 DIAGNOSIS — Z8546 Personal history of malignant neoplasm of prostate: Secondary | ICD-10-CM | POA: Diagnosis not present

## 2024-02-13 DIAGNOSIS — I2609 Other pulmonary embolism with acute cor pulmonale: Secondary | ICD-10-CM

## 2024-02-13 DIAGNOSIS — K219 Gastro-esophageal reflux disease without esophagitis: Secondary | ICD-10-CM | POA: Diagnosis present

## 2024-02-13 DIAGNOSIS — C3411 Malignant neoplasm of upper lobe, right bronchus or lung: Secondary | ICD-10-CM

## 2024-02-13 DIAGNOSIS — D630 Anemia in neoplastic disease: Secondary | ICD-10-CM | POA: Diagnosis present

## 2024-02-13 DIAGNOSIS — Z832 Family history of diseases of the blood and blood-forming organs and certain disorders involving the immune mechanism: Secondary | ICD-10-CM | POA: Diagnosis not present

## 2024-02-13 DIAGNOSIS — Z811 Family history of alcohol abuse and dependence: Secondary | ICD-10-CM | POA: Diagnosis not present

## 2024-02-13 DIAGNOSIS — Z7951 Long term (current) use of inhaled steroids: Secondary | ICD-10-CM | POA: Diagnosis not present

## 2024-02-13 DIAGNOSIS — Z7960 Long term (current) use of unspecified immunomodulators and immunosuppressants: Secondary | ICD-10-CM | POA: Diagnosis not present

## 2024-02-13 DIAGNOSIS — C779 Secondary and unspecified malignant neoplasm of lymph node, unspecified: Secondary | ICD-10-CM | POA: Diagnosis present

## 2024-02-13 DIAGNOSIS — D649 Anemia, unspecified: Secondary | ICD-10-CM | POA: Diagnosis not present

## 2024-02-13 LAB — BASIC METABOLIC PANEL WITH GFR
Anion gap: 9 (ref 5–15)
BUN: 13 mg/dL (ref 8–23)
CO2: 22 mmol/L (ref 22–32)
Calcium: 8.7 mg/dL — ABNORMAL LOW (ref 8.9–10.3)
Chloride: 104 mmol/L (ref 98–111)
Creatinine, Ser: 0.62 mg/dL (ref 0.61–1.24)
GFR, Estimated: >60 mL/min (ref >60)
Glucose, Bld: 114 mg/dL — ABNORMAL HIGH (ref 70–99)
Potassium: 3.9 mmol/L (ref 3.5–5.1)
Sodium: 135 mmol/L (ref 135–145)

## 2024-02-13 LAB — BRAIN NATRIURETIC PEPTIDE: B Natriuretic Peptide: 43.2 pg/mL (ref 0.0–100.0)

## 2024-02-13 LAB — ECHOCARDIOGRAM COMPLETE
AR max vel: 2.19 cm2
AV Peak grad: 7.8 mmHg
Ao pk vel: 1.4 m/s
Area-P 1/2: 6.27 cm2
Calc EF: 38 %
Height: 67 in
S' Lateral: 4.3 cm
Single Plane A2C EF: 48.7 %
Single Plane A4C EF: 32.9 %
Weight: 1989.43 [oz_av]

## 2024-02-13 LAB — CBC
HCT: 24.2 % — ABNORMAL LOW (ref 39.0–52.0)
Hemoglobin: 7.7 g/dL — ABNORMAL LOW (ref 13.0–17.0)
MCH: 25.2 pg — ABNORMAL LOW (ref 26.0–34.0)
MCHC: 31.8 g/dL (ref 30.0–36.0)
MCV: 79.3 fL — ABNORMAL LOW (ref 80.0–100.0)
Platelets: 441 10*3/uL — ABNORMAL HIGH (ref 150–400)
RBC: 3.05 MIL/uL — ABNORMAL LOW (ref 4.22–5.81)
RDW: 14.9 % (ref 11.5–15.5)
WBC: 9.4 10*3/uL (ref 4.0–10.5)
nRBC: 0 % (ref 0.0–0.2)

## 2024-02-13 LAB — HEPARIN LEVEL (UNFRACTIONATED)
Heparin Unfractionated: 0.12 [IU]/mL — ABNORMAL LOW (ref 0.30–0.70)
Heparin Unfractionated: 0.14 [IU]/mL — ABNORMAL LOW (ref 0.30–0.70)
Heparin Unfractionated: 0.27 [IU]/mL — ABNORMAL LOW (ref 0.30–0.70)

## 2024-02-13 MED ORDER — MORPHINE SULFATE (PF) 2 MG/ML IV SOLN
1.0000 mg | Freq: Once | INTRAVENOUS | Status: DC | PRN
  Filled 2024-02-13: qty 1

## 2024-02-13 MED ORDER — GUAIFENESIN-CODEINE 100-10 MG/5ML PO SOLN
10.0000 mL | Freq: Four times a day (QID) | ORAL | Status: DC | PRN
  Administered 2024-02-13 – 2024-02-15 (×6): 10 mL via ORAL
  Filled 2024-02-13 (×6): qty 10

## 2024-02-13 MED ORDER — MORPHINE SULFATE (PF) 4 MG/ML IV SOLN
4.0000 mg | INTRAVENOUS | Status: DC | PRN
  Administered 2024-02-13: 4 mg via INTRAVENOUS
  Filled 2024-02-13: qty 1

## 2024-02-13 MED ORDER — NALOXONE HCL 0.4 MG/ML IJ SOLN: 0.4000 mg | INTRAMUSCULAR | Status: DC | PRN

## 2024-02-13 MED ORDER — HYDROCODONE-ACETAMINOPHEN 5-325 MG PO TABS: 2.0000 | ORAL_TABLET | ORAL | Status: DC | PRN

## 2024-02-13 MED ORDER — HEPARIN BOLUS VIA INFUSION
2000.0000 [IU] | Freq: Once | INTRAVENOUS | Status: AC
  Administered 2024-02-13: 2000 [IU] via INTRAVENOUS
  Filled 2024-02-13: qty 2000

## 2024-02-13 MED ORDER — HEPARIN BOLUS VIA INFUSION
1000.0000 [IU] | Freq: Once | INTRAVENOUS | Status: AC
  Administered 2024-02-13: 1000 [IU] via INTRAVENOUS
  Filled 2024-02-13: qty 1000

## 2024-02-13 MED ORDER — MORPHINE SULFATE (PF) 4 MG/ML IV SOLN
4.0000 mg | Freq: Once | INTRAVENOUS | Status: DC | PRN
  Administered 2024-02-13: 4 mg via INTRAVENOUS
  Filled 2024-02-13: qty 1

## 2024-02-13 MED ORDER — MORPHINE SULFATE (PF) 4 MG/ML IV SOLN
4.0000 mg | INTRAVENOUS | Status: DC | PRN
  Administered 2024-02-13 – 2024-02-14 (×6): 4 mg via INTRAVENOUS
  Filled 2024-02-13 (×6): qty 1

## 2024-02-13 NOTE — Progress Notes (Signed)
 Echocardiogram 2D Echocardiogram has been performed.  Jeff Romero 02/13/2024, 12:46 PM

## 2024-02-13 NOTE — Progress Notes (Signed)
 [facility] PROGRESS NOTE    Progress Note  Jeff Romero  RUE:[id number] DOB: 03/03/1961 DOA: 02/12/2024 PCP: [facility]     Brief Narrative:   Jeff Romero is an 63 y.o. male past medical history of recently diagnosed stage IV lung cancer with a large necrotic mass with osseous and lymph nodes metastases, history of prostate cancer status post prostatectomy, hyperlipidemia depression comes into the ED for pleuritic chest pain.  CT angio of the chest showed bilateral pulmonary emboli with a large necrotic right upper lung mass whith is stable with hilar lymphadenopathy.  Assessment/Plan:   Bilateral pulmonary embolism (HCC): CT showed PE. Was started on IV heparin, 2D echo was obtained. Agree with discontinuing Megace . Satting 90% on room air tachycardia has resolved blood pressure stable.  Right lower lobe opacity: Tmax 99.5 with no leukocytosis.  Empirically on IV Rocephin and doxycycline .  Microcytic anemia: Signs of overt bleeding.  Hemoglobin this morning is 7.7.  Malignant neoplasm of upper lobe of right lung [facility]) Undergoing radiation will follow-up with Dr. Liam Redhead to start chemotherapy on 02/21/2024    Hypokalemia Replete orally recheck in the morning.   DVT prophylaxis: heparin Family Communication:Wife Status is: Observation The patient remains OBS appropriate and will d/c before 2 midnights.    Code Status:     Code Status Orders  (From admission, onward)           Start     Ordered   02/12/24 2306  Full code  Continuous       Question:  By:  Answer:  Consent: discussion documented in EHR   02/12/24 2307           Code Status History     Date Active Date Inactive Code Status Order ID Comments User Context   01/21/2024 0052 01/22/2024 2207 Full Code [id number]  Willadean Hark, MD ED   07/13/2016 0309 07/15/2016 1813 Full Code [id number]  Angelene [facility], MD ED         IV Access:   Peripheral IV   Procedures  and diagnostic studies:   CT Angio Chest PE W and/or Wo Contrast Result Date: 02/12/2024 CLINICAL DATA:  Shortness of breath.  Lung cancer. EXAM: CT ANGIOGRAPHY CHEST WITH CONTRAST TECHNIQUE: Multidetector CT imaging of the chest was performed using the standard protocol during bolus administration of intravenous contrast. Multiplanar CT image reconstructions and MIPs were obtained to evaluate the vascular anatomy. RADIATION DOSE REDUCTION: This exam was performed according to the departmental dose-optimization program which includes automated exposure control, adjustment of the mA and/or kV according to patient size and/or use of iterative reconstruction technique. CONTRAST:  75mL OMNIPAQUE  IOHEXOL  350 MG/ML SOLN COMPARISON:  Chest CT 01/20/2024 and PET-CT 02/01/2019 FINDINGS: Cardiovascular: The heart is within normal limits in size. No pericardial effusion. Stable fluid in the pericardial recesses. The aorta is normal in caliber. No dissection. Scattered atherosclerotic calcifications. Scattered coronary artery calcifications. The pulmonary arterial tree is well opacified. Significant bilateral second and third order pulmonary emboli. No findings for significant right heart strain. The RV LV ratio is less than 1. Mediastinum/Nodes: Stable mediastinal and hilar adenopathy. The esophagus is grossly normal. Lungs/Pleura: Large necrotic right upper lobe lung mass appears relatively stable. Stable underlying advanced emphysematous changes with areas of pulmonary scarring. New small right pleural effusion. Right lower lobe airspace opacity, likely infiltrate. Suspect new right lower lobe pulmonary nodule on image 105/9 measuring 9 mm. Significant interval enlargement a left lower lobe pulmonary nodule. This measures 9  mm on image 104/9. Upper Abdomen: Stable low-attenuation lesions in the right hepatic lobe consistent with a benign cyst. No adrenal gland lesions or upper abdominal adenopathy. Musculoskeletal: No  chest wall abnormality. No acute or significant osseous findings. Review of the MIP images confirms the above findings. IMPRESSION: 1. Significant bilateral second and third order pulmonary emboli. No findings for right heart strain. 2. Large necrotic right upper lobe lung mass appears relatively stable. 3. Stable mediastinal and hilar adenopathy. 4. New small right pleural effusion. 5. Right lower lobe airspace opacity, likely infiltrate. 6. New/enlarging right lower lobe and left lower lobe nodules. 7. No significant upper abdominal findings. Aortic Atherosclerosis (ICD10-I70.0) and Emphysema (ICD10-J43.9). These results were called by telephone at the time of interpretation on 02/12/2024 at 8:36 pm to provider ANDREW TEE , who verbally acknowledged these results. Electronically Signed   By: Marrian Siva M.D.   On: 02/12/2024 20:36   DG Chest Port 1 View Result Date: 02/12/2024 CLINICAL DATA:  Chest pain, lung cancer. EXAM: PORTABLE CHEST 1 VIEW COMPARISON:  None Available. FINDINGS: The heart size and mediastinal contours are within normal limits. Right upper lung masslike alveolar process has become more dense as compared to the previous examination. There is interstitial prominence in the right lower lung. Left lung is clear. No pneumothorax or pleural effusion. IMPRESSION: 1. Right upper lung masslike alveolar process has become more dense. 2. Interstitial prominence in the right lower lung. Electronically Signed   By: Sydell Eva M.D.   On: 02/12/2024 20:11     Medical Consultants:   None.   Subjective:    [name] he relates his pain is better controlled.  Objective:    Vitals:   02/13/24 0330 02/13/24 0341 02/13/24 0500 02/13/24 0600  BP: 133/69  127/70 124/70  Pulse: (!) 101  100 94  Resp: 19  16 19   Temp:  98.8 F (37.1 C)    TempSrc:  Oral    SpO2: 98%  97% 99%  Weight:      Height:       SpO2: 99 %  No intake or output data in the 24 hours ending 02/13/24  0643 Filed Weights   02/12/24 1628  Weight: 56.4 kg    Exam: General exam: In no acute distress. Respiratory system: Good air movement and clear to auscultation. Cardiovascular system: S1 & S2 heard, RRR. No JVD. Gastrointestinal system: Abdomen is nondistended, soft and nontender.  Extremities: No pedal edema. Skin: No rashes, lesions or ulcers Psychiatry: Judgement and insight appear normal. Mood & affect appropriate.    Data Reviewed:    Labs: Basic Metabolic Panel: Recent Labs  Lab 02/07/24 1122 02/12/24 1715  NA 135 136  K 3.9 3.3*  CL 102 104  CO2 25 20*  GLUCOSE 112* 102*  BUN 13 15  CREATININE 0.87 0.81  CALCIUM 9.0 8.4*   GFR Estimated Creatinine Clearance: 75.4 mL/min (by C-G formula based on SCr of 0.81 mg/dL). Liver Function Tests: Recent Labs  Lab 02/07/24 1122  AST 16  ALT 20  ALKPHOS 73  BILITOT 0.3  PROT 7.1  ALBUMIN 3.3*   No results for input(s): "LIPASE", "AMYLASE" in the last 168 hours. No results for input(s): "AMMONIA" in the last 168 hours. Coagulation profile No results for input(s): "INR", "PROTIME" in the last 168 hours. COVID-19 Labs  No results for input(s): "DDIMER", "FERRITIN", "LDH", "CRP" in the last 72 hours.  Lab Results  Component Value Date   SARSCOV2NAA NEGATIVE 01/20/2024  CBC: Recent Labs  Lab 02/07/24 1122 02/12/24 1715 02/13/24 0525  WBC 11.8* 10.0 9.4  NEUTROABS 9.6*  --   --   HGB 8.8* 8.1* 7.7*  HCT 27.0* 25.3* 24.2*  MCV 77.8* 79.3* 79.3*  PLT 459* 439* 441*   Cardiac Enzymes: No results for input(s): "CKTOTAL", "CKMB", "CKMBINDEX", "TROPONINI" in the last 168 hours. BNP (last 3 results) No results for input(s): "PROBNP" in the last 8760 hours. CBG: No results for input(s): "GLUCAP" in the last 168 hours. D-Dimer: No results for input(s): "DDIMER" in the last 72 hours. Hgb A1c: No results for input(s): "HGBA1C" in the last 72 hours. Lipid Profile: No results for input(s): "CHOL", "HDL",  "LDLCALC", "TRIG", "CHOLHDL", "LDLDIRECT" in the last 72 hours. Thyroid function studies: No results for input(s): "TSH", "T4TOTAL", "T3FREE", "THYROIDAB" in the last 72 hours.  Invalid input(s): "FREET3" Anemia work up: No results for input(s): "VITAMINB12", "FOLATE", "FERRITIN", "TIBC", "IRON", "RETICCTPCT" in the last 72 hours. Sepsis Labs: Recent Labs  Lab 02/07/24 1122 02/12/24 1715 02/13/24 0525  WBC 11.8* 10.0 9.4   Microbiology No results found for this or any previous visit (from the past 240 hours).   Medications:    doxycycline   100 mg Oral Q12H   pantoprazole   40 mg Oral Daily   rosuvastatin  10 mg Oral Daily   sodium chloride  flush  3 mL Intravenous Q12H   Continuous Infusions:  cefTRIAXone (ROCEPHIN)  IV     heparin 1,000 Units/hr (02/12/24 2147)      LOS: 0 days   Macdonald Savoy  [facility]  02/13/2024, 6:43 AM

## 2024-02-13 NOTE — Progress Notes (Signed)
 ANTICOAGULATION CONSULT NOTE  Pharmacy Consult for Heparin Indication: pulmonary embolus  No Known Allergies  Patient Measurements: Height: 5\' 7"  (170.2 cm) Weight: 56.4 kg (124 lb 5.4 oz) IBW/kg (Calculated) : 66.1 Heparin Dosing Weight: 56.4 kg  Vital Signs: Temp: 99.8 F (37.7 C) (05/11 1147) Temp Source: Oral (05/11 1147) BP: 110/65 (05/11 1422) Pulse Rate: 106 (05/11 1422)  Labs: Recent Labs    02/12/24 1715 02/12/24 1907 02/13/24 0525 02/13/24 1501  HGB 8.1*  --  7.7*  --   HCT 25.3*  --  24.2*  --   PLT 439*  --  441*  --   HEPARINUNFRC  --   --  0.12* 0.14*  CREATININE 0.81  --  0.62  --   TROPONINIHS 46* 41*  --   --     Estimated Creatinine Clearance: 76.4 mL/min (by C-G formula based on SCr of 0.62 mg/dL).   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 20 tablet 0 02/12/2024 Morning   albuterol (VENTOLIN HFA) 108 (90 Base) MCG/ACT inhaler Inhale 1 puff into the lungs every 6 (six) hours as needed for wheezing or shortness of breath.   Unknown   baclofen (LIORESAL) 20 MG tablet Take 1 tablet (20 mg total) by mouth 3 (three) times daily. 30 each 0 02/12/2024 Noon   benzonatate  (TESSALON  PERLES) 100 MG capsule Take 2 capsules (200 mg total) by mouth every 6 (six) hours as needed for cough. 30 capsule 1 02/12/2024 Noon   benzonatate  (TESSALON ) 100 MG capsule Take 1 capsule (100 mg total) by mouth 3 (three) times daily as needed for cough. 20 capsule 0 Taking As Needed   desipramine (NORPRAMIN) 25  MG tablet Take 25 mg by mouth daily.   02/11/2024   diclofenac (VOLTAREN) 75 MG EC tablet Take 1 tablet (75 mg total) by mouth 2 (two) times daily. 30 tablet 0 02/11/2024   fenofibrate 160 MG tablet Take 160 mg by mouth daily.   02/11/2024   guaiFENesin -codeine  100-10 MG/5ML syrup Take 10 mLs by mouth 3 (three) times daily as needed for cough. 237 mL 0 Past Week   ibuprofen  (ADVIL ) 600 MG tablet Take 1 tablet (600 mg total) by mouth every 6 (six) hours as needed for mild pain (pain score 1-3), headache or moderate pain (pain score 4-6). 20 tablet 0 02/12/2024 Noon   ondansetron  (ZOFRAN ) 4 MG tablet Take 1 tablet (4 mg total) by mouth every 6 (six) hours as needed for nausea. 20 tablet 0 02/12/2024   OVER THE COUNTER MEDICATION Unknown eye drop and directions   Unknown   pantoprazole  (PROTONIX ) 40 MG tablet Take 1 tablet (40 mg total) by mouth daily. 30 tablet 0 02/12/2024 Morning   prochlorperazine  (COMPAZINE ) 10 MG tablet Take 1 tablet (10 mg total) by mouth every 6 (six) hours as needed for nausea or vomiting. 30 tablet 1 Unknown   rosuvastatin (CRESTOR) 10 MG tablet Take 10 mg by mouth daily.  Past Week   senna-docusate (SENOKOT-S) 8.6-50 MG tablet Take 1 tablet by mouth at bedtime as needed for mild constipation. 30 tablet 0 Unknown   Scheduled:   doxycycline   100 mg Oral Q12H   pantoprazole   40 mg Oral Daily   rosuvastatin  10 mg Oral Daily   sodium chloride  flush  3 mL Intravenous Q12H   Infusions:   cefTRIAXone (ROCEPHIN)  IV     heparin 1,200 Units/hr (02/13/24 0721)   PRN: acetaminophen  **OR** acetaminophen , albuterol, bisacodyl, guaiFENesin -codeine , HYDROcodone-acetaminophen , morphine  injection, naLOXone (NARCAN)  injection, ondansetron  **OR** ondansetron  (ZOFRAN ) IV, senna-docusate  Assessment: 53 yom with recently diagnosed stage III lung cancer and presenting with chest and back pain found to have PE. CTA w/ PE and no evidence of RHS. No anticoagulation prior to admission. Pharmacy  consulted to dose heparin.   Started on 1000 units/hr IV heparin following 4500 unit IV heparin bolus.   Levels: 5/11 0525: 0.12 -- 2000 unit IV bolus given and increased to 1200 units/hr 5/11 1501: 0.14 -- Low No issues with infusion or bleeding per RN.  Hgb 7.7; plt 441  Goal of Therapy:  Heparin level 0.3-0.7 units/ml Monitor platelets by anticoagulation protocol: Yes   Plan:  Give IV heparin 2000 units bolus x 1 Increase heparin infusion to 1400 units/hr Check anti-Xa level at 2300 and daily while on heparin Continue to monitor H&H and platelets  Dionicio Fray, PharmD, BCPS 02/13/2024 3:52 PM ED Clinical Pharmacist -  [phone]

## 2024-02-13 NOTE — ED Notes (Signed)
 Pt OTF with transport, in no new onset distress at this time.

## 2024-02-13 NOTE — Progress Notes (Signed)
 ANTICOAGULATION CONSULT NOTE  Pharmacy Consult for Heparin Indication: pulmonary embolus  No Known Allergies  Patient Measurements: Height: 5\' 7"  (170.2 cm) Weight: 56.4 kg (124 lb 5.4 oz) IBW/kg (Calculated) : 66.1 Heparin Dosing Weight: 56.4 kg  Vital Signs: Temp: 98.1 F (36.7 C) (05/11 2000) Temp Source: Oral (05/11 2000) BP: 120/72 (05/11 2000) Pulse Rate: 105 (05/11 2000)  Labs: Recent Labs    02/12/24 1715 02/12/24 1907 02/13/24 0525 02/13/24 1501 02/13/24 2230  HGB 8.1*  --  7.7*  --   --   HCT 25.3*  --  24.2*  --   --   PLT 439*  --  441*  --   --   HEPARINUNFRC  --   --  0.12* 0.14* 0.27*  CREATININE 0.81  --  0.62  --   --   TROPONINIHS 46* 41*  --   --   --     Estimated Creatinine Clearance: 76.4 mL/min (by C-G formula based on SCr of 0.62 mg/dL).   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 CTA w/ PE and no evidence of RHS. No anticoagulation prior to admission. Pharmacy consulted to dose heparin.   PM: heparin level slightly below goal on 1400 units/hr. Per RN, no signs/symptoms of bleeding or issues with the heparin infusion running continuously. Hgb noted to be 7.7   Goal of Therapy:  Heparin level 0.3-0.7 units/ml Monitor platelets by anticoagulation protocol: Yes   Plan:  Give IV heparin 1000 units bolus x 1 Increase heparin infusion to 1500 units/hr Check anti-Xa level in 6h and daily while on heparin Continue to monitor H&H and platelets  Young Hensen, PharmD, BCPS Clinical Pharmacist 02/13/2024 11:38 PM    ADDENDUM  -heparin level returned at 0.3 on 1500 units/hr (drawn ~5h after rate  change). Per RN, no signs/symptoms of bleeding or issues with the heparin infusion running continuously. CBC shows Hgb low, stable.  Goal of Therapy:  Heparin level 0.3-0.7 units/ml Monitor platelets by anticoagulation protocol: Yes   Plan:  Increase heparin infusion to 1600 units/hr to keep within goal range Check confirmatory anti-Xa level in 6h and daily while on heparin Continue to monitor H&H and platelets  Young Hensen, PharmD, BCPS Clinical Pharmacist 02/14/2024 6:13 AM

## 2024-02-13 NOTE — ED Notes (Signed)
 Pt given pillow and heat pack. Introduced self to pt and family.

## 2024-02-13 NOTE — ED Notes (Signed)
 MD paged for pain medication. This RN is waiting to be called back.

## 2024-02-13 NOTE — Progress Notes (Signed)
 ANTICOAGULATION CONSULT NOTE  Pharmacy Consult for Heparin Indication: pulmonary embolus  No Known Allergies  Patient Measurements: Height: 5\' 7"  (170.2 cm) Weight: 56.4 kg (124 lb 5.4 oz) IBW/kg (Calculated) : 66.1 Heparin Dosing Weight: 56.4 kg  Vital Signs: Temp: 98.8 F (37.1 C) (05/11 0341) Temp Source: Oral (05/11 0341) BP: 124/70 (05/11 0600) Pulse Rate: 94 (05/11 0600)  Labs: Recent Labs    02/12/24 1715 02/12/24 1907 02/13/24 0525  HGB 8.1*  --  7.7*  HCT 25.3*  --  24.2*  PLT 439*  --  441*  HEPARINUNFRC  --   --  0.12*  CREATININE 0.81  --  0.62  TROPONINIHS 46* 41*  --    Estimated Creatinine Clearance: 76.4 mL/min (by C-G formula based on SCr of 0.62 mg/dL).  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 CTA w/ PE and no evidence of RHS. No anticoagulation prior to admission. Pharmacy consulted to dose heparin.  Heparin level 0.12, subtherapeutic  No issues with bleeding or infusion overnight.   Goal of Therapy:  Heparin level 0.3-0.7 units/ml Monitor platelets by anticoagulation protocol: Yes   Plan:  Heparin 2000 units x 1 as bolus and increase heparin infusion to 1200 units/hr 6h heparin level  Daily heparin level, CBC, and monitoring for bleeding F/u plans for anticoagulation   Thank you for allowing pharmacy to participate in this patient's care.  Fonda Hymen, PharmD Emergency Medicine Clinical Pharmacist 02/13/2024,7:04 AM

## 2024-02-13 NOTE — ED Notes (Addendum)
 Echo notified to do patients echocardiogram at bedside.

## 2024-02-14 ENCOUNTER — Other Ambulatory Visit (HOSPITAL_COMMUNITY): Payer: Self-pay

## 2024-02-14 ENCOUNTER — Encounter: Payer: Self-pay | Admitting: Internal Medicine

## 2024-02-14 ENCOUNTER — Telehealth (HOSPITAL_COMMUNITY): Payer: Self-pay | Admitting: Pharmacy Technician

## 2024-02-14 ENCOUNTER — Ambulatory Visit
Admission: RE | Admit: 2024-02-14 | Discharge: 2024-02-14 | Disposition: A | Discharge status: Home / Self Care | Source: Ambulatory Visit | Attending: Radiation Oncology

## 2024-02-14 ENCOUNTER — Other Ambulatory Visit: Payer: Self-pay

## 2024-02-14 DIAGNOSIS — D649 Anemia, unspecified: Secondary | ICD-10-CM

## 2024-02-14 DIAGNOSIS — R918 Other nonspecific abnormal finding of lung field: Secondary | ICD-10-CM | POA: Diagnosis not present

## 2024-02-14 DIAGNOSIS — I2699 Other pulmonary embolism without acute cor pulmonale: Secondary | ICD-10-CM | POA: Diagnosis not present

## 2024-02-14 DIAGNOSIS — C3411 Malignant neoplasm of upper lobe, right bronchus or lung: Secondary | ICD-10-CM | POA: Diagnosis not present

## 2024-02-14 LAB — CBC
HCT: 24 % — ABNORMAL LOW (ref 39.0–52.0)
Hemoglobin: 7.6 g/dL — ABNORMAL LOW (ref 13.0–17.0)
MCH: 24.6 pg — ABNORMAL LOW (ref 26.0–34.0)
MCHC: 31.7 g/dL (ref 30.0–36.0)
MCV: 77.7 fL — ABNORMAL LOW (ref 80.0–100.0)
Platelets: 447 10*3/uL — ABNORMAL HIGH (ref 150–400)
RBC: 3.09 MIL/uL — ABNORMAL LOW (ref 4.22–5.81)
RDW: 14.7 % (ref 11.5–15.5)
WBC: 9.4 10*3/uL (ref 4.0–10.5)
nRBC: 0 % (ref 0.0–0.2)

## 2024-02-14 LAB — RAD ONC ARIA SESSION SUMMARY
Course Elapsed Days: 6
Plan Fractions Treated to Date: 5
Plan Prescribed Dose Per Fraction: 3 Gy
Plan Total Fractions Prescribed: 10
Plan Total Prescribed Dose: 30 Gy
Reference Point Dosage Given to Date: 15 Gy
Reference Point Session Dosage Given: 3 Gy
Session Number: 5

## 2024-02-14 LAB — HEPARIN LEVEL (UNFRACTIONATED): Heparin Unfractionated: 0.3 [IU]/mL (ref 0.30–0.70)

## 2024-02-14 MED ORDER — ACETAMINOPHEN 500 MG PO TABS
1000.0000 mg | ORAL_TABLET | Freq: Three times a day (TID) | ORAL | Status: DC
  Administered 2024-02-14 – 2024-02-15 (×4): 1000 mg via ORAL
  Filled 2024-02-14 (×4): qty 2

## 2024-02-14 MED ORDER — OXYCODONE HCL 5 MG PO TABS: 10.0000 mg | ORAL_TABLET | Freq: Four times a day (QID) | ORAL | Status: DC | PRN

## 2024-02-14 MED ORDER — BENZONATATE 100 MG PO CAPS
200.0000 mg | ORAL_CAPSULE | Freq: Three times a day (TID) | ORAL | Status: DC
  Administered 2024-02-14 – 2024-02-15 (×4): 200 mg via ORAL
  Filled 2024-02-14 (×4): qty 2

## 2024-02-14 MED ORDER — AMOXICILLIN-POT CLAVULANATE 875-125 MG PO TABS
1.0000 | ORAL_TABLET | Freq: Two times a day (BID) | ORAL | Status: DC
Start: 2024-02-14 — End: 2024-02-15
  Administered 2024-02-14 – 2024-02-15 (×3): 1 via ORAL
  Filled 2024-02-14 (×3): qty 1

## 2024-02-14 MED ORDER — APIXABAN 5 MG PO TABS
5.0000 mg | ORAL_TABLET | Freq: Two times a day (BID) | ORAL | Status: DC
Start: 2024-02-21 — End: 2024-02-15

## 2024-02-14 MED ORDER — SENNOSIDES-DOCUSATE SODIUM 8.6-50 MG PO TABS
2.0000 | ORAL_TABLET | Freq: Every day | ORAL | Status: DC
  Administered 2024-02-14: 2 via ORAL
  Filled 2024-02-14: qty 2

## 2024-02-14 MED ORDER — POLYETHYLENE GLYCOL 3350 17 G PO PACK
17.0000 g | PACK | Freq: Every day | ORAL | Status: DC
  Administered 2024-02-14 – 2024-02-15 (×2): 17 g via ORAL
  Filled 2024-02-14 (×2): qty 1

## 2024-02-14 MED ORDER — MORPHINE SULFATE (PF) 2 MG/ML IV SOLN: 2.0000 mg | INTRAVENOUS | Status: DC | PRN

## 2024-02-14 MED ORDER — OXYCODONE HCL 5 MG PO TABS
10.0000 mg | ORAL_TABLET | Freq: Four times a day (QID) | ORAL | Status: DC | PRN
  Administered 2024-02-14 (×2): 10 mg via ORAL
  Filled 2024-02-14: qty 2
  Filled 2024-02-14: qty 3
  Filled 2024-02-14: qty 2

## 2024-02-14 MED ORDER — APIXABAN 5 MG PO TABS
10.0000 mg | ORAL_TABLET | Freq: Two times a day (BID) | ORAL | Status: DC
  Administered 2024-02-14 – 2024-02-15 (×3): 10 mg via ORAL
  Filled 2024-02-14 (×3): qty 2

## 2024-02-14 NOTE — Telephone Encounter (Signed)
 Patient Product/process development scientist completed.    The patient is insured through [facility] and [facility] MEDICAID.     Ran test claim for Eliquis Starter Pack and the current 30 day co-pay is $4.00.   This test claim was processed through [facility]- copay amounts may vary at other pharmacies due to pharmacy/plan contracts, or as the patient moves through the different stages of their insurance plan.     Morgan Arab, CPHT Pharmacy Technician III Certified Patient Advocate [facility] Patient Advocate Team Direct Number: [phone]  Fax: [phone]

## 2024-02-14 NOTE — Progress Notes (Signed)
 PROGRESS NOTE        PATIENT DETAILS Name: Jeff Romero Age: 63 y.o. Sex: male Date of Birth: 1961/01/15 Admit Date: 02/12/2024 Admitting Physician Macdonald Savoy, MD WUJ:[id number], [name]  Brief Summary: Patient is a 62 y.o.  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  Significant events: 5/10>> admit to [facility]  Significant studies: 5/10>> CTA chest: Bilateral 2nd/3rd order pulmonary emboli-no right heart strain.  Large necrotic right upper lobe mass-right lower airspace disease-likely infiltrate. 5/11>> echo: EF 40-45%, grade 1 diastolic dysfunction, small pericardial effusion, moderate in the.  Significant microbiology data: None  Procedures: None  Consults: None  Subjective: Lying comfortably in bed-pleuritic chest pain/back pain is significantly better-but he has been getting IV morphine  pretty frequently for the past 24 hours.  He is not short of breath-stable on room air.  Objective: Vitals: Blood pressure 116/65, pulse 94, temperature 98.7 F (37.1 C), temperature source Oral, resp. rate 16, height 5\' 7"  (1.702 m), weight 56.4 kg, SpO2 95%.   Exam: Gen Exam:Alert awake-not in any distress HEENT:atraumatic, normocephalic Chest: B/L clear to auscultation anteriorly CVS:S1S2 regular Abdomen:soft non tender, non distended Extremities:no edema Neurology: Non focal Skin: no rash  Pertinent Labs/Radiology:    Latest Ref Rng & Units 02/14/2024    5:04 AM 02/13/2024    5:25 AM 02/12/2024    5:15 PM  CBC  WBC 4.0 - 10.5 K/uL 9.4  9.4  10.0   Hemoglobin 13.0 - 17.0 g/dL 7.6  7.7  8.1   Hematocrit 39.0 - 52.0 % 24.0  24.2  25.3   Platelets 150 - 400 K/uL 447  441  439     Lab Results  Component Value Date   NA 135 02/13/2024   K 3.9  02/13/2024   CL 104 02/13/2024   CO2 22 02/13/2024      Assessment/Plan: Pulmonary embolism Likely secondary to underlying malignancy. Stable-on room air-blood pressure stable Still having pleuritic pain but better controlled with IV morphine -discussed with nursing staff-will try and switch him to oral narcotics to see if that controls his pain before we can safely discharge him home. Suspect we can switch him from IV heparin to Eliquis. Will watch him closely for 24 hours-if stable on Eliquis-pain controlled on oral narcotics-he can likely be discharged home on 5/13.  Probable postobstructive pneumonia Afebrile-no leukocytosis Switch Rocephin/doxycycline  to Augmentin.  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 Sigurd Driver him of patient's admission/new diagnosis of PE.  Normocytic anemia Secondary to underlying malignancy No evidence of blood loss Watch closely while patient on anticoagulation.  GERD PPI  HLD Statin   Code status:   Code Status: Full Code   DVT Prophylaxis: apixaban (ELIQUIS) tablet 10 mg  apixaban (ELIQUIS) tablet 5 mg     Family Communication: Spouse at bedside   Disposition Plan: Status is: Inpatient Remains inpatient appropriate because: Severity of illness   Planned Discharge Destination:Home   Diet: Diet Order             Diet Heart Room service appropriate? Yes; Fluid consistency: Thin  Diet effective now  Antimicrobial agents: Anti-infectives (From admission, onward)    Start     Dose/Rate Route Frequency Ordered Stop   02/13/24 2200  cefTRIAXone (ROCEPHIN) 2 g in sodium chloride  0.9 % 100 mL IVPB        2 g 200 mL/hr over 30 Minutes Intravenous Every 24 hours 02/12/24 2307     02/13/24 1000  doxycycline  (VIBRA -TABS) tablet 100 mg        100 mg Oral Every 12 hours 02/12/24  2307     02/12/24 2100  cefTRIAXone (ROCEPHIN) 1 g in sodium chloride  0.9 % 100 mL IVPB        1 g 200 mL/hr over 30 Minutes Intravenous  Once 02/12/24 2059 02/12/24 2223   02/12/24 2100  doxycycline  (VIBRA -TABS) tablet 100 mg        100 mg Oral  Once 02/12/24 2059 02/12/24 2155        MEDICATIONS: Scheduled Meds:  acetaminophen   1,000 mg Oral Q8H   apixaban  10 mg Oral BID   Followed by   Cecily Cohen ON 02/21/2024] apixaban  5 mg Oral BID   benzonatate   200 mg Oral TID   doxycycline   100 mg Oral Q12H   pantoprazole   40 mg Oral Daily   polyethylene glycol  17 g Oral Daily   rosuvastatin  10 mg Oral Daily   sodium chloride  flush  3 mL Intravenous Q12H   Continuous Infusions:  cefTRIAXone (ROCEPHIN)  IV Stopped (02/13/24 2154)   heparin 1,600 Units/hr (02/14/24 0635)   PRN Meds:.albuterol, bisacodyl, guaiFENesin -codeine , morphine  injection, naLOXone (NARCAN)  injection, ondansetron  **OR** ondansetron  (ZOFRAN ) IV, oxyCODONE , senna-docusate   I have personally reviewed following labs and imaging studies  LABORATORY DATA: CBC: Recent Labs  Lab 02/07/24 1122 02/12/24 1715 02/13/24 0525 02/14/24 0504  WBC 11.8* 10.0 9.4 9.4  NEUTROABS 9.6*  --   --   --   HGB 8.8* 8.1* 7.7* 7.6*  HCT 27.0* 25.3* 24.2* 24.0*  MCV 77.8* 79.3* 79.3* 77.7*  PLT 459* 439* 441* 447*    Basic Metabolic Panel: Recent Labs  Lab 02/07/24 1122 02/12/24 1715 02/13/24 0525  NA 135 136 135  K 3.9 3.3* 3.9  CL 102 104 104  CO2 25 20* 22  GLUCOSE 112* 102* 114*  BUN 13 15 13   CREATININE 0.87 0.81 0.62  CALCIUM 9.0 8.4* 8.7*    GFR: Estimated Creatinine Clearance: 76.4 mL/min (by C-G formula based on SCr of 0.62 mg/dL).  Liver Function Tests: Recent Labs  Lab 02/07/24 1122  AST 16  ALT 20  ALKPHOS 73  BILITOT 0.3  PROT 7.1  ALBUMIN 3.3*   No results for input(s): "LIPASE", "AMYLASE" in the last 168 hours. No results for input(s): "AMMONIA" in the last 168 hours.  Coagulation  Profile: No results for input(s): "INR", "PROTIME" in the last 168 hours.  Cardiac Enzymes: No results for input(s): "CKTOTAL", "CKMB", "CKMBINDEX", "TROPONINI" in the last 168 hours.  BNP (last 3 results) No results for input(s): "PROBNP" in the last 8760 hours.  Lipid Profile: No results for input(s): "CHOL", "HDL", "LDLCALC", "TRIG", "CHOLHDL", "LDLDIRECT" in the last 72 hours.  Thyroid Function Tests: No results for input(s): "TSH", "T4TOTAL", "FREET4", "T3FREE", "THYROIDAB" in the last 72 hours.  Anemia Panel: No results for input(s): "VITAMINB12", "FOLATE", "FERRITIN", "TIBC", "IRON", "RETICCTPCT" in the last 72 hours.  Urine analysis:    Component Value Date/Time   COLORURINE BROWN (A) 07/12/2016 1833   APPEARANCEUR CLOUDY (A) 07/12/2016 1833   LABSPEC 1.010  07/12/2016 1833   PHURINE >9.0 (H) 07/12/2016 1833   GLUCOSEU 100 (A) 07/12/2016 1833   HGBUR TRACE (A) 07/12/2016 1833   BILIRUBINUR LARGE (A) 07/12/2016 1833   KETONESUR 15 (A) 07/12/2016 1833   PROTEINUR 100 (A) 07/12/2016 1833   NITRITE POSITIVE (A) 07/12/2016 1833   LEUKOCYTESUR MODERATE (A) 07/12/2016 1833    Sepsis Labs: Lactic Acid, Venous    Component Value Date/Time   LATICACIDVEN 1.35 07/12/2016 2130    MICROBIOLOGY: No results found for this or any previous visit (from the past 240 hours).  RADIOLOGY STUDIES/RESULTS: ECHOCARDIOGRAM COMPLETE Result Date: 02/13/2024    ECHOCARDIOGRAM REPORT   Patient Name:   Bayley Beamesderfer Date of Exam: 02/13/2024 Medical Rec #:  [id number]   Height:       67.0 in Accession #:    [id number]  Weight:       124.3 lb Date of Birth:  December 14, 1960    BSA:          1.652 m Patient Age:    62 years    BP:           125/77 mmHg Patient Gender: M           HR:           108 bpm. Exam Location:  Inpatient Procedure: 2D Echo, 3D Echo, Cardiac Doppler and Color Doppler (Both Spectral            and Color Flow Doppler were utilized during procedure). Indications:    Pulmonary Embolus  I26.09  History:        Patient has no prior history of Echocardiogram examinations.  Sonographer:    Terrilee Few RCS Referring Phys: Beverley Buck PATEL IMPRESSIONS  1. Left ventricular ejection fraction, by estimation, is 40 to 45%. Left ventricular ejection fraction by 3D volume is 40 %. The left ventricle has mildly decreased function. The left ventricle demonstrates global hypokinesis. Left ventricular diastolic  parameters are consistent with Grade I diastolic dysfunction (impaired relaxation).  2. Right ventricular systolic function is normal. The right ventricular size is normal. Tricuspid regurgitation signal is inadequate for assessing PA pressure.  3. A small pericardial effusion is present. The pericardial effusion is posterior to the left ventricle.  4. The mitral valve is normal in structure. Moderate mitral valve regurgitation. No evidence of mitral stenosis.  5. Tricuspid valve regurgitation is moderate.  6. The aortic valve is normal in structure. Aortic valve regurgitation is mild. No aortic stenosis is present.  7. The inferior vena cava is normal in size with greater than 50% respiratory variability, suggesting right atrial pressure of 3 mmHg. FINDINGS  Left Ventricle: Left ventricular ejection fraction, by estimation, is 40 to 45%. Left ventricular ejection fraction by 3D volume is 40 %. The left ventricle has mildly decreased function. The left ventricle demonstrates global hypokinesis. The left ventricular internal cavity size was normal in size. There is no left ventricular hypertrophy. Left ventricular diastolic parameters are consistent with Grade I diastolic dysfunction (impaired relaxation). Right Ventricle: The right ventricular size is normal. No increase in right ventricular wall thickness. Right ventricular systolic function is normal. Tricuspid regurgitation signal is inadequate for assessing PA pressure. Left Atrium: Left atrial size was normal in size. Right Atrium: Right atrial  size was normal in size. Pericardium: A small pericardial effusion is present. The pericardial effusion is posterior to the left ventricle. Mitral Valve: The mitral valve is normal in structure. Moderate mitral valve regurgitation. No evidence of mitral  valve stenosis. Tricuspid Valve: The tricuspid valve is normal in structure. Tricuspid valve regurgitation is moderate . No evidence of tricuspid stenosis. Aortic Valve: The aortic valve is normal in structure. Aortic valve regurgitation is mild. No aortic stenosis is present. Aortic valve peak gradient measures 7.8 mmHg. Pulmonic Valve: The pulmonic valve was not well visualized. Pulmonic valve regurgitation is trivial. No evidence of pulmonic stenosis. Aorta: The aortic root is normal in size and structure. Venous: The inferior vena cava is normal in size with greater than 50% respiratory variability, suggesting right atrial pressure of 3 mmHg. IAS/Shunts: No atrial level shunt detected by color flow Doppler. Additional Comments: 3D was performed not requiring image post processing on an independent workstation and was abnormal.  LEFT VENTRICLE PLAX 2D LVIDd:         5.50 cm         Diastology LVIDs:         4.30 cm         LV e' medial:    9.90 cm/s LV PW:         2.05 cm         LV E/e' medial:  7.1 LV IVS:        0.50 cm         LV e' lateral:   14.80 cm/s LVOT diam:     1.90 cm         LV E/e' lateral: 4.8 LV SV:         51 LV SV Index:   31 LVOT Area:     2.84 cm        3D Volume EF                                LV 3D EF:    Left                                             ventricul LV Volumes (MOD)                            ar LV vol d, MOD    116.0 ml                   ejection A2C:                                        fraction LV vol d, MOD    94.4 ml                    by 3D A4C:                                        volume is LV vol s, MOD    59.5 ml                    40 %. A2C: LV vol s, MOD    63.3 ml A4C:  3D Volume EF:  LV SV MOD A2C:   56.5 ml       3D EF:        40 % LV SV MOD A4C:   94.4 ml       LV EDV:       140 ml LV SV MOD BP:    37.7 ml       LV ESV:       84 ml                                LV SV:        56 ml RIGHT VENTRICLE             IVC RV S prime:     25.60 cm/s  IVC diam: 1.90 cm TAPSE (M-mode): 2.4 cm LEFT ATRIUM             Index        RIGHT ATRIUM           Index LA diam:        3.30 cm 2.00 cm/m   RA Area:     13.50 cm LA Vol (A2C):   38.4 ml 23.24 ml/m  RA Volume:   30.00 ml  18.16 ml/m LA Vol (A4C):   34.0 ml 20.58 ml/m LA Biplane Vol: 36.8 ml 22.27 ml/m  AORTIC VALVE AV Area (Vmax): 2.19 cm AV Vmax:        140.00 cm/s AV Peak Grad:   7.8 mmHg LVOT Vmax:      108.00 cm/s LVOT Vmean:     73.900 cm/s LVOT VTI:       0.179 m  AORTA Ao Root diam: 3.70 cm Ao Asc diam:  3.20 cm MITRAL VALVE MV Area (PHT): 6.27 cm     SHUNTS MV Decel Time: 121 msec     Systemic VTI:  0.18 m MV E velocity: 70.60 cm/s   Systemic Diam: 1.90 cm MV A velocity: 105.00 cm/s MV E/A ratio:  0.67 Kardie Tobb DO Electronically signed by Jerryl Morin DO Signature Date/Time: 02/13/2024/1:25:37 PM    Final    CT Angio Chest PE W and/or Wo Contrast Result Date: 02/12/2024 CLINICAL DATA:  Shortness of breath.  Lung cancer. EXAM: CT ANGIOGRAPHY CHEST WITH CONTRAST TECHNIQUE: Multidetector CT imaging of the chest was performed using the standard protocol during bolus administration of intravenous contrast. Multiplanar CT image reconstructions and MIPs were obtained to evaluate the vascular anatomy. RADIATION DOSE REDUCTION: This exam was performed according to the departmental dose-optimization program which includes automated exposure control, adjustment of the mA and/or kV according to patient size and/or use of iterative reconstruction technique. CONTRAST:  75mL OMNIPAQUE  IOHEXOL  350 MG/ML SOLN COMPARISON:  Chest CT 01/20/2024 and PET-CT 02/01/2019 FINDINGS: Cardiovascular: The heart is within normal limits in size. No pericardial  effusion. Stable fluid in the pericardial recesses. The aorta is normal in caliber. No dissection. Scattered atherosclerotic calcifications. Scattered coronary artery calcifications. The pulmonary arterial tree is well opacified. Significant bilateral second and third order pulmonary emboli. No findings for significant right heart strain. The RV LV ratio is less than 1. Mediastinum/Nodes: Stable mediastinal and hilar adenopathy. The esophagus is grossly normal. Lungs/Pleura: Large necrotic right upper lobe lung mass appears relatively stable. Stable underlying advanced emphysematous changes with areas of pulmonary scarring. New small right pleural effusion. Right lower lobe airspace opacity, likely infiltrate. Suspect new right lower  lobe pulmonary nodule on image 105/9 measuring 9 mm. Significant interval enlargement a left lower lobe pulmonary nodule. This measures 9 mm on image 104/9. Upper Abdomen: Stable low-attenuation lesions in the right hepatic lobe consistent with a benign cyst. No adrenal gland lesions or upper abdominal adenopathy. Musculoskeletal: No chest wall abnormality. No acute or significant osseous findings. Review of the MIP images confirms the above findings. IMPRESSION: 1. Significant bilateral second and third order pulmonary emboli. No findings for right heart strain. 2. Large necrotic right upper lobe lung mass appears relatively stable. 3. Stable mediastinal and hilar adenopathy. 4. New small right pleural effusion. 5. Right lower lobe airspace opacity, likely infiltrate. 6. New/enlarging right lower lobe and left lower lobe nodules. 7. No significant upper abdominal findings. Aortic Atherosclerosis (ICD10-I70.0) and Emphysema (ICD10-J43.9). These results were called by telephone at the time of interpretation on 02/12/2024 at 8:36 pm to provider ANDREW TEE , who verbally acknowledged these results. Electronically Signed   By: Marrian Siva M.D.   On: 02/12/2024 20:36   DG Chest Port 1  View Result Date: 02/12/2024 CLINICAL DATA:  Chest pain, lung cancer. EXAM: PORTABLE CHEST 1 VIEW COMPARISON:  None Available. FINDINGS: The heart size and mediastinal contours are within normal limits. Right upper lung masslike alveolar process has become more dense as compared to the previous examination. There is interstitial prominence in the right lower lung. Left lung is clear. No pneumothorax or pleural effusion. IMPRESSION: 1. Right upper lung masslike alveolar process has become more dense. 2. Interstitial prominence in the right lower lung. Electronically Signed   By: Sydell Eva M.D.   On: 02/12/2024 20:11     LOS: 1 day   Kimberly Penna, MD  [facility]    To contact the attending provider between 7A-7P or the covering provider during after hours 7P-7A, please log into the web site [email or web address] and access using universal [place] password for that web site. If you do not have the password, please call the hospital operator.  02/14/2024, 8:57 AM

## 2024-02-14 NOTE — Discharge Instructions (Addendum)
 Information on my medicine - ELIQUIS (apixaban)  Why was Eliquis prescribed for you? Eliquis was prescribed to treat blood clots that may have been found in the veins of your legs (deep vein thrombosis) or in your lungs (pulmonary embolism) and to reduce the risk of them occurring again.  What do You need to know about Eliquis ? The starting dose is 10 mg (two 5 mg tablets) taken TWICE daily for the FIRST SEVEN (7) DAYS, then on 02/21/24  the dose is reduced to ONE 5 mg tablet taken TWICE daily.  Eliquis may be taken with or without food.   Try to take the dose about the same time in the morning and in the evening. If you have difficulty swallowing the tablet whole please discuss with your pharmacist how to take the medication safely.  Take Eliquis exactly as prescribed and DO NOT stop taking Eliquis without talking to the doctor who prescribed the medication.  Stopping may increase your risk of developing a new blood clot.  Refill your prescription before you run out.  After discharge, you should have regular check-up appointments with your healthcare provider that is prescribing your Eliquis.    What do you do if you miss a dose? If a dose of ELIQUIS is not taken at the scheduled time, take it as soon as possible on the same day and twice-daily administration should be resumed. The dose should not be doubled to make up for a missed dose.  Important Safety Information A possible side effect of Eliquis is bleeding. You should call your healthcare provider right away if you experience any of the following: Bleeding from an injury or your nose that does not stop. Unusual colored urine (red or dark brown) or unusual colored stools (red or black). Unusual bruising for unknown reasons. A serious fall or if you hit your head (even if there is no bleeding).  Some medicines may interact with Eliquis and might increase your risk of bleeding or clotting while on Eliquis. To help avoid this,  consult your healthcare provider or pharmacist prior to using any new prescription or non-prescription medications, including herbals, vitamins, non-steroidal anti-inflammatory drugs (NSAIDs) and supplements.  This website has more information on Eliquis (apixaban): http://[email or web address].com/eliquis/home

## 2024-02-14 NOTE — Plan of Care (Signed)
  Problem: Clinical Measurements: Goal: Respiratory complications will improve Outcome: Not Progressing   Problem: Clinical Measurements: Goal: Cardiovascular complication will be avoided Outcome: Not Progressing   Problem: Clinical Measurements: Goal: Diagnostic test results will improve Outcome: Not Progressing   Problem: Clinical Measurements: Goal: Will remain free from infection Outcome: Not Progressing   Problem: Elimination: Goal: Will not experience complications related to bowel motility Outcome: Not Progressing   Problem: Pain Managment: Goal: General experience of comfort will improve and/or be controlled Outcome: Not Progressing   Problem: Safety: Goal: Ability to remain free from injury will improve Outcome: Not Progressing

## 2024-02-14 NOTE — Progress Notes (Signed)
 ANTICOAGULATION CONSULT NOTE  Pharmacy Consult for Heparin>>apixaban Indication: pulmonary embolus  No Known Allergies  Patient Measurements: Height: 5\' 7"  (170.2 cm) Weight: 56.4 kg (124 lb 5.4 oz) IBW/kg (Calculated) : 66.1 Heparin Dosing Weight: 56.4 kg  Vital Signs: Temp: 98.7 F (37.1 C) (05/12 0337) Temp Source: Oral (05/12 0337) BP: 116/65 (05/12 0000) Pulse Rate: 94 (05/12 0337)  Labs: Recent Labs    02/12/24 1715 02/12/24 1715 02/12/24 1907 02/13/24 0525 02/13/24 1501 02/13/24 2230 02/14/24 0504  HGB 8.1*  --   --  7.7*  --   --  7.6*  HCT 25.3*  --   --  24.2*  --   --  24.0*  PLT 439*  --   --  441*  --   --  447*  HEPARINUNFRC  --    < >  --  0.12* 0.14* 0.27* 0.30  CREATININE 0.81  --   --  0.62  --   --   --   TROPONINIHS 46*  --  41*  --   --   --   --    < > = values in this interval not displayed.    Estimated Creatinine Clearance: 76.4 mL/min (by C-G formula based on SCr of 0.62 mg/dL).   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 CTA w/ PE and no evidence of RHS. No anticoagulation prior to admission. Pharmacy consulted to dose heparin.   5/12 AM: Pharmacy consulted to transition from heparin to apixaban.    Plan:  STOP heparin infusion START apixaban 10 mg BID x 7 days, followed by 5 mg BID- administer first dose at the same time heparin infusion is stopped  Plan communicated to primary RN   [place] $4   Chrystie Crass, PharmD Clinical Pharmacist  02/14/2024 8:23 AM

## 2024-02-15 ENCOUNTER — Other Ambulatory Visit: Payer: Self-pay

## 2024-02-15 ENCOUNTER — Other Ambulatory Visit (HOSPITAL_COMMUNITY): Payer: Self-pay

## 2024-02-15 ENCOUNTER — Ambulatory Visit
Admission: RE | Admit: 2024-02-15 | Discharge: 2024-02-15 | Discharge status: Home / Self Care | Source: Ambulatory Visit | Attending: Radiation Oncology

## 2024-02-15 ENCOUNTER — Encounter: Payer: Self-pay | Admitting: Internal Medicine

## 2024-02-15 DIAGNOSIS — C3411 Malignant neoplasm of upper lobe, right bronchus or lung: Secondary | ICD-10-CM | POA: Diagnosis not present

## 2024-02-15 DIAGNOSIS — I2699 Other pulmonary embolism without acute cor pulmonale: Secondary | ICD-10-CM | POA: Diagnosis not present

## 2024-02-15 DIAGNOSIS — E876 Hypokalemia: Secondary | ICD-10-CM | POA: Diagnosis not present

## 2024-02-15 DIAGNOSIS — D649 Anemia, unspecified: Secondary | ICD-10-CM | POA: Diagnosis not present

## 2024-02-15 LAB — RAD ONC ARIA SESSION SUMMARY
Course Elapsed Days: 7
Plan Fractions Treated to Date: 6
Plan Prescribed Dose Per Fraction: 3 Gy
Plan Total Fractions Prescribed: 10
Plan Total Prescribed Dose: 30 Gy
Reference Point Dosage Given to Date: 18 Gy
Reference Point Session Dosage Given: 3 Gy
Session Number: 6

## 2024-02-15 MED ORDER — AMOXICILLIN-POT CLAVULANATE 875-125 MG PO TABS
1.0000 | ORAL_TABLET | Freq: Two times a day (BID) | ORAL | 0 refills | Status: AC
  Filled 2024-02-15: qty 6, 3d supply, fill #0

## 2024-02-15 MED ORDER — SENNOSIDES-DOCUSATE SODIUM 8.6-50 MG PO TABS
2.0000 | ORAL_TABLET | Freq: Every day | ORAL | 2 refills | Status: DC
  Filled 2024-02-15: qty 60, 30d supply, fill #0

## 2024-02-15 MED ORDER — ACETAMINOPHEN 500 MG PO TABS: 1000.0000 mg | ORAL_TABLET | Freq: Three times a day (TID) | ORAL | Status: DC

## 2024-02-15 MED ORDER — POLYETHYLENE GLYCOL 3350 17 GM/SCOOP PO POWD
17.0000 g | Freq: Every day | ORAL | 1 refills | Status: DC
  Filled 2024-02-15: qty 476, 28d supply, fill #0

## 2024-02-15 MED ORDER — APIXABAN 5 MG PO TABS
ORAL_TABLET | ORAL | 3 refills | Status: DC
  Filled 2024-02-15: qty 72, 30d supply, fill #0

## 2024-02-15 MED ORDER — BENZONATATE 200 MG PO CAPS
200.0000 mg | ORAL_CAPSULE | Freq: Three times a day (TID) | ORAL | 0 refills | Status: DC
  Filled 2024-02-15: qty 90, 30d supply, fill #0

## 2024-02-15 MED ORDER — GUAIFENESIN-CODEINE 100-10 MG/5ML PO SOLN
10.0000 mL | Freq: Three times a day (TID) | ORAL | 0 refills | Status: DC | PRN
  Filled 2024-02-15 – 2024-03-24 (×2): qty 237, 8d supply, fill #0

## 2024-02-15 MED ORDER — OXYCODONE HCL 10 MG PO TABS
10.0000 mg | ORAL_TABLET | Freq: Four times a day (QID) | ORAL | 0 refills | Status: DC | PRN
  Filled 2024-02-15: qty 25, 7d supply, fill #0

## 2024-02-15 NOTE — Plan of Care (Signed)

## 2024-02-15 NOTE — Plan of Care (Signed)
?  Problem: Nutrition: ?Goal: Risk of aspiration will decrease ?Outcome: Progressing ?  ?Problem: Activity: ?Goal: Risk for activity intolerance will decrease ?Outcome: Progressing ?  ?Problem: Nutrition: ?Goal: Adequate nutrition will be maintained ?Outcome: Progressing ?  ?

## 2024-02-15 NOTE — Discharge Summary (Signed)
 PATIENT DETAILS Name: Jeff Romero Age: 63 y.o. Sex: male Date of Birth: October 17, 1960 MRN: [id number]. Admitting Physician: Macdonald Savoy, MD ONG:[facility]WU, [name]  Admit Date: 02/12/2024 Discharge date: 02/15/2024  Recommendations for Outpatient Follow-up:  Follow up with PCP in 1-2 weeks Please obtain CMP/CBC in one week  Admitted From:  Home  Disposition: Home   Discharge Condition: good  CODE STATUS:   Code Status: Full Code   Diet recommendation:  Diet Order             Diet - low sodium heart healthy           Diet Heart Room service appropriate? Yes; Fluid consistency: Thin  Diet effective now                    Brief Summary: Patient is a 63 y.o.  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   Significant events: 5/10>> admit to [facility]   Significant studies: 5/10>> CTA chest: Bilateral 2nd/3rd order pulmonary emboli-no right heart strain.  Large necrotic right upper lobe mass-right lower airspace disease-likely infiltrate. 5/11>> echo: EF 40-45%, grade 1 diastolic dysfunction, small pericardial effusion, moderate in the.   Significant microbiology data: None   Procedures: None   Consults: None  Brief Hospital Course: Pulmonary embolism Likely secondary to underlying malignancy. Stable-on room air-blood pressure stable Initially on IV heparin-but has been switched to Eliquis on 5/12-continues to be stable-on room air.  Pleuritic pain is much better and easily controlled with a combination of scheduled Tylenol  and oral oxycodone .  Stable for discharge today-will continue anticoagulation and follow-up with primary oncologist/hematologist Dr. Marguerita Shih.    Probable postobstructive pneumonia Afebrile-no leukocytosis Initially on IV antibiotics-has been  switched to Augmentin which will be continued for a few more days.   Stage IV known small cell cancer of the lung Ongoing radiation-palliative Per prior notes-chemotherapy to be started on 5/19 Primary oncologist-Dr. Marguerita Shih notified of patient's admission-new diagnosis of PE.     Normocytic anemia Secondary to underlying malignancy No evidence of blood loss Watch closely while patient on anticoagulation.   GERD PPI   HLD Statin  Discharge Diagnoses:  Principal Problem:   Bilateral pulmonary embolism (HCC) Active Problems:   Malignant neoplasm of upper lobe of right lung (HCC)   Right lower lobe pulmonary infiltrate   Anemia   Hypokalemia   Acute pulmonary embolism [facility])   Discharge Instructions:  Activity:  As tolerated   Discharge Instructions     Call MD for:  difficulty breathing, headache or visual disturbances   Complete by: As directed    Call MD for:  severe uncontrolled pain   Complete by: As directed    Diet - low sodium heart healthy   Complete by: As directed    Discharge instructions   Complete by: As directed     Follow with Primary MD  [facility] in 1-2 weeks  Follow-up with Dr. Marguerita Shih in 1-2 weeks  Please get a complete blood count and chemistry panel checked by your Primary MD at your next visit, and again as instructed by your Primary MD.  Get Medicines reviewed and adjusted: Please take all your medications with you for your next visit with your Primary MD  Laboratory/radiological data: Please request your Primary MD to go over all hospital tests and procedure/radiological results at  the follow up, please ask your Primary MD to get all Hospital records sent to his/her office.  In some cases, they will be blood work, cultures and biopsy results pending at the time of your discharge. Please request that your primary care M.D. follows up on these results.  Also Note the following: If you experience worsening of your admission  symptoms, develop shortness of breath, life threatening emergency, suicidal or homicidal thoughts you must seek medical attention immediately by calling 911 or calling your MD immediately  if symptoms less severe.  You must read complete instructions/literature along with all the possible adverse reactions/side effects for all the Medicines you take and that have been prescribed to you. Take any new Medicines after you have completely understood and accpet all the possible adverse reactions/side effects.   Do not drive when taking Pain medications or sleeping medications (Benzodaizepines)  Do not take more than prescribed Pain, Sleep and Anxiety Medications. It is not advisable to combine anxiety,sleep and pain medications without talking with your primary care practitioner  Special Instructions: If you have smoked or chewed Tobacco  in the last 2 yrs please stop smoking, stop any regular Alcohol  and or any Recreational drug use.  Wear Seat belts while driving.  Please note: You were cared for by a hospitalist during your hospital stay. Once you are discharged, your primary care physician will handle any further medical issues. Please note that NO REFILLS for any discharge medications will be authorized once you are discharged, as it is imperative that you return to your primary care physician (or establish a relationship with a primary care physician if you do not have one) for your post hospital discharge needs so that they can reassess your need for medications and monitor your lab values.   Increase activity slowly   Complete by: As directed       Allergies as of 02/15/2024   No Known Allergies      Medication List     STOP taking these medications    ibuprofen  600 MG tablet Commonly known as: ADVIL        TAKE these medications    acetaminophen  500 MG tablet Commonly known as: TYLENOL  Take 2 tablets (1,000 mg total) by mouth every 8 (eight) hours. What changed:   medication strength how much to take when to take this reasons to take this   albuterol 108 (90 Base) MCG/ACT inhaler Commonly known as: VENTOLIN HFA Inhale 1 puff into the lungs every 6 (six) hours as needed for wheezing or shortness of breath.   amoxicillin-clavulanate 875-125 MG tablet Commonly known as: AUGMENTIN Take 1 tablet by mouth every 12 (twelve) hours for 3 days.   apixaban 5 MG Tabs tablet Commonly known as: ELIQUIS Take 2 tablets (10 mg) p.o. twice daily, on 5/19-switch to 1 tablet (5 mg) twice daily.   baclofen 20 MG tablet Commonly known as: LIORESAL Take 1 tablet (20 mg total) by mouth 3 (three) times daily.   benzonatate  200 MG capsule Commonly known as: TESSALON  Take 1 capsule (200 mg total) by mouth 3 (three) times daily. What changed:  medication strength how much to take when to take this reasons to take this Another medication with the same name was removed. Continue taking this medication, and follow the directions you see here.   desipramine 25 MG tablet Commonly known as: NORPRAMIN Take 25 mg by mouth daily.   diclofenac 75 MG EC tablet Commonly known as: VOLTAREN Take 1 tablet (75  mg total) by mouth 2 (two) times daily.   fenofibrate 160 MG tablet Take 160 mg by mouth daily.   guaiFENesin -codeine  100-10 MG/5ML syrup Take 10 mLs by mouth 3 (three) times daily as needed for cough.   ondansetron  4 MG tablet Commonly known as: ZOFRAN  Take 1 tablet (4 mg total) by mouth every 6 (six) hours as needed for nausea.   OVER THE COUNTER MEDICATION Unknown eye drop and directions   Oxycodone  HCl 10 MG Tabs Take 1 tablet (10 mg total) by mouth every 6 (six) hours as needed for moderate pain (pain score 4-6).   pantoprazole  40 MG tablet Commonly known as: PROTONIX  Take 1 tablet (40 mg total) by mouth daily.   polyethylene glycol 17 g packet Commonly known as: MIRALAX / GLYCOLAX Take 17 g by mouth daily.   prochlorperazine  10 MG  tablet Commonly known as: COMPAZINE  Take 1 tablet (10 mg total) by mouth every 6 (six) hours as needed for nausea or vomiting.   rosuvastatin 10 MG tablet Commonly known as: CRESTOR Take 10 mg by mouth daily.   senna-docusate 8.6-50 MG tablet Commonly known as: Senokot-S Take 2 tablets by mouth at bedtime. What changed:  how much to take when to take this reasons to take this        Follow-up Information     [facility]. Schedule an appointment as soon as possible for a visit in 1 week(s).   Contact information: [address] [place] [address] [phone]         Marlene Simas, MD Follow up.   Specialty: Oncology Why: keep next appt Contact information: [address] [place] [address] [phone]                No Known Allergies   Other Procedures/Studies: ECHOCARDIOGRAM COMPLETE Result Date: 02/13/2024    ECHOCARDIOGRAM REPORT   Patient Name:   Jeff Romero Date of Exam: 02/13/2024 Medical Rec #:  [id number]   Height:       67.0 in Accession #:    [id number]  Weight:       124.3 lb Date of Birth:  November 26, 1960    BSA:          1.652 m Patient Age:    62 years    BP:           125/77 mmHg Patient Gender: M           HR:           108 bpm. Exam Location:  Inpatient Procedure: 2D Echo, 3D Echo, Cardiac Doppler and Color Doppler (Both Spectral            and Color Flow Doppler were utilized during procedure). Indications:    Pulmonary Embolus I26.09  History:        Patient has no prior history of Echocardiogram examinations.  Sonographer:    Terrilee Few RCS Referring Phys: Beverley Buck PATEL IMPRESSIONS  1. Left ventricular ejection fraction, by estimation, is 40 to 45%. Left ventricular ejection fraction by 3D volume is 40 %. The left ventricle has mildly decreased function. The left ventricle demonstrates global hypokinesis. Left ventricular diastolic  parameters are consistent with Grade I diastolic dysfunction (impaired  relaxation).  2. Right ventricular systolic function is normal. The right ventricular size is normal. Tricuspid regurgitation signal is inadequate for assessing PA pressure.  3. A small pericardial effusion is present. The pericardial effusion is posterior to the left ventricle.  4. The  mitral valve is normal in structure. Moderate mitral valve regurgitation. No evidence of mitral stenosis.  5. Tricuspid valve regurgitation is moderate.  6. The aortic valve is normal in structure. Aortic valve regurgitation is mild. No aortic stenosis is present.  7. The inferior vena cava is normal in size with greater than 50% respiratory variability, suggesting right atrial pressure of 3 mmHg. FINDINGS  Left Ventricle: Left ventricular ejection fraction, by estimation, is 40 to 45%. Left ventricular ejection fraction by 3D volume is 40 %. The left ventricle has mildly decreased function. The left ventricle demonstrates global hypokinesis. The left ventricular internal cavity size was normal in size. There is no left ventricular hypertrophy. Left ventricular diastolic parameters are consistent with Grade I diastolic dysfunction (impaired relaxation). Right Ventricle: The right ventricular size is normal. No increase in right ventricular wall thickness. Right ventricular systolic function is normal. Tricuspid regurgitation signal is inadequate for assessing PA pressure. Left Atrium: Left atrial size was normal in size. Right Atrium: Right atrial size was normal in size. Pericardium: A small pericardial effusion is present. The pericardial effusion is posterior to the left ventricle. Mitral Valve: The mitral valve is normal in structure. Moderate mitral valve regurgitation. No evidence of mitral valve stenosis. Tricuspid Valve: The tricuspid valve is normal in structure. Tricuspid valve regurgitation is moderate . No evidence of tricuspid stenosis. Aortic Valve: The aortic valve is normal in structure. Aortic valve regurgitation is  mild. No aortic stenosis is present. Aortic valve peak gradient measures 7.8 mmHg. Pulmonic Valve: The pulmonic valve was not well visualized. Pulmonic valve regurgitation is trivial. No evidence of pulmonic stenosis. Aorta: The aortic root is normal in size and structure. Venous: The inferior vena cava is normal in size with greater than 50% respiratory variability, suggesting right atrial pressure of 3 mmHg. IAS/Shunts: No atrial level shunt detected by color flow Doppler. Additional Comments: 3D was performed not requiring image post processing on an independent workstation and was abnormal.  LEFT VENTRICLE PLAX 2D LVIDd:         5.50 cm         Diastology LVIDs:         4.30 cm         LV e' medial:    9.90 cm/s LV PW:         2.05 cm         LV E/e' medial:  7.1 LV IVS:        0.50 cm         LV e' lateral:   14.80 cm/s LVOT diam:     1.90 cm         LV E/e' lateral: 4.8 LV SV:         51 LV SV Index:   31 LVOT Area:     2.84 cm        3D Volume EF                                LV 3D EF:    Left                                             ventricul LV Volumes (MOD)  ar LV vol d, MOD    116.0 ml                   ejection A2C:                                        fraction LV vol d, MOD    94.4 ml                    by 3D A4C:                                        volume is LV vol s, MOD    59.5 ml                    40 %. A2C: LV vol s, MOD    63.3 ml A4C:                           3D Volume EF: LV SV MOD A2C:   56.5 ml       3D EF:        40 % LV SV MOD A4C:   94.4 ml       LV EDV:       140 ml LV SV MOD BP:    37.7 ml       LV ESV:       84 ml                                LV SV:        56 ml RIGHT VENTRICLE             IVC RV S prime:     25.60 cm/s  IVC diam: 1.90 cm TAPSE (M-mode): 2.4 cm LEFT ATRIUM             Index        RIGHT ATRIUM           Index LA diam:        3.30 cm 2.00 cm/m   RA Area:     13.50 cm LA Vol (A2C):   38.4 ml 23.24 ml/m  RA Volume:   30.00 ml   18.16 ml/m LA Vol (A4C):   34.0 ml 20.58 ml/m LA Biplane Vol: 36.8 ml 22.27 ml/m  AORTIC VALVE AV Area (Vmax): 2.19 cm AV Vmax:        140.00 cm/s AV Peak Grad:   7.8 mmHg LVOT Vmax:      108.00 cm/s LVOT Vmean:     73.900 cm/s LVOT VTI:       0.179 m  AORTA Ao Root diam: 3.70 cm Ao Asc diam:  3.20 cm MITRAL VALVE MV Area (PHT): 6.27 cm     SHUNTS MV Decel Time: 121 msec     Systemic VTI:  0.18 m MV E velocity: 70.60 cm/s   Systemic Diam: 1.90 cm MV A velocity: 105.00 cm/s MV E/A ratio:  0.67 Kardie Tobb DO Electronically signed by Jerryl Morin DO Signature Date/Time: 02/13/2024/1:25:37 PM    Final    CT Angio Chest PE W and/or Wo Contrast Result Date: 02/12/2024 CLINICAL  DATA:  Shortness of breath.  Lung cancer. EXAM: CT ANGIOGRAPHY CHEST WITH CONTRAST TECHNIQUE: Multidetector CT imaging of the chest was performed using the standard protocol during bolus administration of intravenous contrast. Multiplanar CT image reconstructions and MIPs were obtained to evaluate the vascular anatomy. RADIATION DOSE REDUCTION: This exam was performed according to the departmental dose-optimization program which includes automated exposure control, adjustment of the mA and/or kV according to patient size and/or use of iterative reconstruction technique. CONTRAST:  75mL OMNIPAQUE  IOHEXOL  350 MG/ML SOLN COMPARISON:  Chest CT 01/20/2024 and PET-CT 02/01/2019 FINDINGS: Cardiovascular: The heart is within normal limits in size. No pericardial effusion. Stable fluid in the pericardial recesses. The aorta is normal in caliber. No dissection. Scattered atherosclerotic calcifications. Scattered coronary artery calcifications. The pulmonary arterial tree is well opacified. Significant bilateral second and third order pulmonary emboli. No findings for significant right heart strain. The RV LV ratio is less than 1. Mediastinum/Nodes: Stable mediastinal and hilar adenopathy. The esophagus is grossly normal. Lungs/Pleura: Large necrotic  right upper lobe lung mass appears relatively stable. Stable underlying advanced emphysematous changes with areas of pulmonary scarring. New small right pleural effusion. Right lower lobe airspace opacity, likely infiltrate. Suspect new right lower lobe pulmonary nodule on image 105/9 measuring 9 mm. Significant interval enlargement a left lower lobe pulmonary nodule. This measures 9 mm on image 104/9. Upper Abdomen: Stable low-attenuation lesions in the right hepatic lobe consistent with a benign cyst. No adrenal gland lesions or upper abdominal adenopathy. Musculoskeletal: No chest wall abnormality. No acute or significant osseous findings. Review of the MIP images confirms the above findings. IMPRESSION: 1. Significant bilateral second and third order pulmonary emboli. No findings for right heart strain. 2. Large necrotic right upper lobe lung mass appears relatively stable. 3. Stable mediastinal and hilar adenopathy. 4. New small right pleural effusion. 5. Right lower lobe airspace opacity, likely infiltrate. 6. New/enlarging right lower lobe and left lower lobe nodules. 7. No significant upper abdominal findings. Aortic Atherosclerosis (ICD10-I70.0) and Emphysema (ICD10-J43.9). These results were called by telephone at the time of interpretation on 02/12/2024 at 8:36 pm to provider ANDREW TEE , who verbally acknowledged these results. Electronically Signed   By: Marrian Siva M.D.   On: 02/12/2024 20:36   DG Chest Port 1 View Result Date: 02/12/2024 CLINICAL DATA:  Chest pain, lung cancer. EXAM: PORTABLE CHEST 1 VIEW COMPARISON:  None Available. FINDINGS: The heart size and mediastinal contours are within normal limits. Right upper lung masslike alveolar process has become more dense as compared to the previous examination. There is interstitial prominence in the right lower lung. Left lung is clear. No pneumothorax or pleural effusion. IMPRESSION: 1. Right upper lung masslike alveolar process has become more  dense. 2. Interstitial prominence in the right lower lung. Electronically Signed   By: Sydell Eva M.D.   On: 02/12/2024 20:11   NM PET Image Initial (PI) Skull Base To Thigh Result Date: 02/02/2024 CLINICAL DATA:  Initial treatment strategy for non-small-cell lung cancer. EXAM: NUCLEAR MEDICINE PET SKULL BASE TO THIGH TECHNIQUE: 6.169 mCi F-18 FDG was injected intravenously. Full-ring PET imaging was performed from the skull base to thigh after the radiotracer. CT data was obtained and used for attenuation correction and anatomic localization. Fasting blood glucose: 92 mg/dl COMPARISON:  Chest CT with contrast 01/20/2024 FINDINGS: Mediastinal blood pool activity: SUV max 2.1 Liver activity: SUV max 2.2 NECK: Supraclavicular lymph node mass identified on the right side with maximum SUV value of 8.6. This  is seen just to the right of the right thyroid lobe and on series 4, image 43 of the CT scan measures 2.9 by 2.3 cm. No additional abnormal areas of uptake identified in the neck including along other lymph node chains. Near symmetric uptake of radiotracer along the visualized intracranial compartment. Incidental CT findings: Visualized portions of the paranasal sinuses and mastoid air cells are clear. The parotid glands, submandibular glands and thyroid glands unremarkable. Scattered vascular calcifications. CHEST: As seen on the prior CT scan there is a large heterogeneous centrally necrotic/cavitary mass in the right upper lobe that extends out to the pleural margin posterolaterally. Maximum SUV value of 12.6. The central mass on contrast CT scan was measured at 4.9 x 4.7 cm. There is confluent tissue extending to the hilum which has a nodular contour as well. This could be a combination of tumor and lymph node. This would have maximum SUV value approaching 12.1 and mass is measured on CT image 69 measures 4.0 x 4.2 cm. There is a associated nodular mass within the course of the right main bronchus with  occlusion. Several satellite lesions are identified elsewhere in the right upper lobe. Additional components of reticulonodular changes in the right upper lobe which could represent lymphangitic spread of disease. Additional hypermetabolic nodes identified including along the internal mammary chain on the right with maximum SUV value of 12.0. Mass on CT image 60 in this location measures 3.8 by 2.4 cm. Additional abnormal nodes right paratracheal, subcarinal. The right paratracheal lesion on CT image 66 measures 3.2 by 2.6 cm and has maximum SUV approaching 9.0. Subcarinal lesion has maximum SUV 9.3. Of note there are no areas of abnormal uptake along left-sided nodes in the mediastinum or left hilum. No axillary nodes. In addition no abnormal uptake in the left lung. Incidental CT findings: Heart is nonenlarged. No pericardial effusion. The thoracic aorta has a normal course and caliber. Scattered calcified plaque at the aortic arch. Breathing motion. Underlying centrilobular and paraseptal emphysematous changes. Apical subpleural blebs. ABDOMEN/PELVIS: There is physiologic distribution radiotracer along the parenchymal organs, bowel and renal collecting systems. Incidental CT findings: Benign right hepatic lobe cysts. Minimal thickening of left adrenal gland but no uptake. No renal or ureteral stones identified. Bowel is nondilated. Scattered stool. Gallbladder is nondilated. Motion. Diffuse vascular calcifications. SKELETON: Abnormal uptake identified along a lytic lesion in the right iliac bone with maximum SUV value of 5.3 consistent with a bone metastasis. No other clear hypermetabolic bone lesions identified in the visualized osseous structures. Incidental CT findings: Mild curvature of the lumbar spine. Scattered degenerative changes of the spine and pelvis. IMPRESSION: As seen on prior CT scan centrally necrotic large right upper lobe hypermetabolic mass identified with nodular tissue tracking to the hilum  and associated bronchial occlusion. There are several slightly lesions in the right upper lobe as well with reticulonodular changes seen of the lungs which could represent lymphangitic spread of disease. Several areas of abnormal hypermetabolic nodes including subcarinal, right hilar, right paratracheal as well as supraclavicular on the right and right internal mammary chain. No left-sided hypermetabolic hilar or mediastinal nodes. Solitary hypermetabolic lesion along the right iliac bone with a lucent lesion on CT worrisome for a solitary bone metastasis. Electronically Signed   By: Adrianna Horde M.D.   On: 02/02/2024 16:39   MR BRAIN W WO CONTRAST Addendum Date: 02/01/2024 ADDENDUM REPORT: 02/01/2024 12:13 ADDENDUM: Study discussed by telephone with Dr. Alisia Apple on 02/01/2024 at 1158 hours. Electronically Signed  By: Marlise Simpers M.D.   On: 02/01/2024 12:13   Result Date: 02/01/2024 CLINICAL DATA:  63 year old male with recently diagnosed large right upper lobe lung mass. Staging. By report no neurologic symptoms at this time. EXAM: MRI HEAD WITHOUT AND WITH CONTRAST TECHNIQUE: Multiplanar, multiecho pulse sequences of the brain and surrounding structures were obtained without and with intravenous contrast. CONTRAST:  6mL GADAVIST  GADOBUTROL  1 MMOL/ML IV SOLN COMPARISON:  None Available. FINDINGS: Brain: No abnormal enhancement identified. No dural thickening. No midline shift, mass effect, or evidence of intracranial mass lesion. But positive for a small clustered, slightly linear area of abnormal diffusion mostly subcortical white matter at the junction of the left posterosuperior and middle frontal gyri (series 5, image 35 and series 13, images 17 and 18). Faint associated FLAIR hyperintensity there. No enhancement there. No evidence of hemorrhage. No mass effect. No other restricted diffusion to suggest acute infarction. No ventriculomegaly, extra-axial collection or acute intracranial hemorrhage.  Cervicomedullary junction and pituitary are within normal limits. Elsewhere gray and white matter signal is within normal limits for age. No cortical encephalomalacia or chronic cerebral blood products identified. Vascular: Major intracranial vascular flow voids are preserved, the distal left vertebral artery appears to be dominant (normal variant). Following contrast the major dural venous sinuses are enhancing and appear to be patent. Skull and upper cervical spine: Heterogeneous bone marrow signal in the visible upper cervical spine, but no destructive or definitely enhancing osseous lesion is identified. Negative visible cervical spinal cord. Sinuses/Orbits: Negative orbits. Paranasal sinuses and mastoids are stable and well aerated. Other: Visible internal auditory structures appear normal. IMPRESSION: 1. Positive for subcentimeter non-enhancing focus of abnormal diffusion in the left frontal lobe. Uvaldo Garfinkel this is a clinically silent small-vessel infarct, rather than tiny metastasis. No associated hemorrhage or mass effect. Recommend repeat MRI without and with contrast in 3 months to re-evaluate. 2. Otherwise negative for age MRI appearance of the Brain. Heterogeneous but probably benign bone marrow signal in the visible cervical spine. Electronically Signed: By: Marlise Simpers M.D. On: 02/01/2024 11:45   CT Chest W Contrast Result Date: 01/20/2024 CLINICAL DATA:  History of cough and abnormal consolidation in the right upper lobe. EXAM: CT CHEST WITH CONTRAST TECHNIQUE: Multidetector CT imaging of the chest was performed during intravenous contrast administration. RADIATION DOSE REDUCTION: This exam was performed according to the departmental dose-optimization program which includes automated exposure control, adjustment of the mA and/or kV according to patient size and/or use of iterative reconstruction technique. CONTRAST:  50mL OMNIPAQUE  IOHEXOL  350 MG/ML SOLN COMPARISON:  Chest x-ray from earlier in the same  day. FINDINGS: Cardiovascular: Thoracic aorta demonstrates atherosclerotic calcifications. No large central pulmonary embolus is identified although timing was not performed for embolus evaluation. Some mild attenuation of the right pulmonary arterial branches is seen no coronary calcifications are noted. No cardiac enlargement is noted. Mediastinum/Nodes: Thoracic inlet is within normal limits. The esophagus is within normal limits. Right hilar and suprahilar adenopathy is identified with central necrosis highly suspicious for metastatic involvement. The largest of these is noted in the right perihilar region measuring 3.8 x 2.4 cm best seen on image number 80 of series 3. A precarinal node with central necrosis is noted measuring 2.0 cm in short axis. The esophagus as visualized is within normal limits. Lungs/Pleura: Left lung shows no acute infiltrate or effusion. Mild emphysematous changes are noted in the apex. Similar emphysematous changes are noted in the right apex. A large centrally necrotic mass lesion is identified  which measures at least 4.9 x 4.7 cm in transverse and AP dimensions respectively. This along with the necrotic adenopathy centrally is consistent with primary pulmonary neoplasm till proven otherwise. Surrounding micro nodular changes are noted likely representing some local spread. Some branching centrally necrotic density is noted particularly in the right upper lobe peripherally suspicious for neoplastic involvement the bronchial tree. Upper Abdomen: Simple cyst is noted in the midportion of the right lobe of the liver. No other focal abnormality is noted. Musculoskeletal: No chest wall abnormality. No acute or significant osseous findings. IMPRESSION: Changes consistent with large right upper lobe necrotic mass lesion with associated necrotic hilar and mediastinal adenopathy. There is also some suggestion of ingrowth into the bronchial tree of the right upper lobe. Peripheral nodular  changes are noted also suspicious for local invasion. Referral to multi disciplinary pulmonary team is recommended. Tissue sampling is recommended as well. Aortic Atherosclerosis (ICD10-I70.0) and Emphysema (ICD10-J43.9). Electronically Signed   By: Violeta Grey M.D.   On: 01/20/2024 22:54   DG Chest 2 View Result Date: 01/20/2024 CLINICAL DATA:  Coughing for 3 months EXAM: CHEST - 2 VIEW COMPARISON:  None Available. FINDINGS: Area of consolidation along the inferior right upper lobe. Question enlargement of the right lung hilum as well. No pneumothorax, effusion or edema. Normal cardiopericardial silhouette. IMPRESSION: Consolidation in the inferior right upper lobe with some fullness of the right lung hilum. Please correlate for any clinical evidence of infection or infiltrate. However with the history of 3 months, a different pathology would be possible and would recommend a contrast CT scan of the chest to further delineate etiology 1 clinically appropriate. Electronically Signed   By: Adrianna Horde M.D.   On: 01/20/2024 14:58     TODAY-DAY OF DISCHARGE:  Subjective:   Jeff Romero today has no headache,no chest abdominal pain,no new weakness tingling or numbness, feels much better wants to go home today.   Objective:   Blood pressure 121/69, pulse (!) 110, temperature 99 F (37.2 C), temperature source Oral, resp. rate 18, height 5\' 7"  (1.702 m), weight 56.4 kg, SpO2 95%.  Intake/Output Summary (Last 24 hours) at 02/15/2024 0810 Last data filed at 02/15/2024 0600 Gross per 24 hour  Intake 3 ml  Output 700 ml  Net -697 ml   Filed Weights   02/12/24 1628  Weight: 56.4 kg    Exam: Awake Alert, Oriented *3, No new F.N deficits, Normal affect [place].AT,PERRAL Supple Neck,No JVD, No cervical lymphadenopathy appriciated.  Symmetrical Chest wall movement, Good air movement bilaterally, CTAB RRR,No Gallops,Rubs or new Murmurs, No Parasternal Heave +ve B.Sounds, Abd Soft, Non tender, No  organomegaly appriciated, No rebound -guarding or rigidity. No Cyanosis, Clubbing or edema, No new Rash or bruise   PERTINENT RADIOLOGIC STUDIES: ECHOCARDIOGRAM COMPLETE Result Date: 02/13/2024    ECHOCARDIOGRAM REPORT   Patient Name:   Jeff Romero Date of Exam: 02/13/2024 Medical Rec #:  [id number]   Height:       67.0 in Accession #:    [id number]  Weight:       124.3 lb Date of Birth:  [birth year]    BSA:          1.652 m Patient Age:    62 years    BP:           125/77 mmHg Patient Gender: M           HR:           108 bpm. Exam Location:  Inpatient Procedure: 2D Echo, 3D Echo, Cardiac Doppler and Color Doppler (Both Spectral            and Color Flow Doppler were utilized during procedure). Indications:    Pulmonary Embolus I26.09  History:        Patient has no prior history of Echocardiogram examinations.  Sonographer:    Terrilee Few RCS Referring Phys: Beverley Buck PATEL IMPRESSIONS  1. Left ventricular ejection fraction, by estimation, is 40 to 45%. Left ventricular ejection fraction by 3D volume is 40 %. The left ventricle has mildly decreased function. The left ventricle demonstrates global hypokinesis. Left ventricular diastolic  parameters are consistent with Grade I diastolic dysfunction (impaired relaxation).  2. Right ventricular systolic function is normal. The right ventricular size is normal. Tricuspid regurgitation signal is inadequate for assessing PA pressure.  3. A small pericardial effusion is present. The pericardial effusion is posterior to the left ventricle.  4. The mitral valve is normal in structure. Moderate mitral valve regurgitation. No evidence of mitral stenosis.  5. Tricuspid valve regurgitation is moderate.  6. The aortic valve is normal in structure. Aortic valve regurgitation is mild. No aortic stenosis is present.  7. The inferior vena cava is normal in size with greater than 50% respiratory variability, suggesting right atrial pressure of 3 mmHg. FINDINGS  Left Ventricle:  Left ventricular ejection fraction, by estimation, is 40 to 45%. Left ventricular ejection fraction by 3D volume is 40 %. The left ventricle has mildly decreased function. The left ventricle demonstrates global hypokinesis. The left ventricular internal cavity size was normal in size. There is no left ventricular hypertrophy. Left ventricular diastolic parameters are consistent with Grade I diastolic dysfunction (impaired relaxation). Right Ventricle: The right ventricular size is normal. No increase in right ventricular wall thickness. Right ventricular systolic function is normal. Tricuspid regurgitation signal is inadequate for assessing PA pressure. Left Atrium: Left atrial size was normal in size. Right Atrium: Right atrial size was normal in size. Pericardium: A small pericardial effusion is present. The pericardial effusion is posterior to the left ventricle. Mitral Valve: The mitral valve is normal in structure. Moderate mitral valve regurgitation. No evidence of mitral valve stenosis. Tricuspid Valve: The tricuspid valve is normal in structure. Tricuspid valve regurgitation is moderate . No evidence of tricuspid stenosis. Aortic Valve: The aortic valve is normal in structure. Aortic valve regurgitation is mild. No aortic stenosis is present. Aortic valve peak gradient measures 7.8 mmHg. Pulmonic Valve: The pulmonic valve was not well visualized. Pulmonic valve regurgitation is trivial. No evidence of pulmonic stenosis. Aorta: The aortic root is normal in size and structure. Venous: The inferior vena cava is normal in size with greater than 50% respiratory variability, suggesting right atrial pressure of 3 mmHg. IAS/Shunts: No atrial level shunt detected by color flow Doppler. Additional Comments: 3D was performed not requiring image post processing on an independent workstation and was abnormal.  LEFT VENTRICLE PLAX 2D LVIDd:         5.50 cm         Diastology LVIDs:         4.30 cm         LV e' medial:     9.90 cm/s LV PW:         2.05 cm         LV E/e' medial:  7.1 LV IVS:        0.50 cm         LV e' lateral:  14.80 cm/s LVOT diam:     1.90 cm         LV E/e' lateral: 4.8 LV SV:         51 LV SV Index:   31 LVOT Area:     2.84 cm        3D Volume EF                                LV 3D EF:    Left                                             ventricul LV Volumes (MOD)                            ar LV vol d, MOD    116.0 ml                   ejection A2C:                                        fraction LV vol d, MOD    94.4 ml                    by 3D A4C:                                        volume is LV vol s, MOD    59.5 ml                    40 %. A2C: LV vol s, MOD    63.3 ml A4C:                           3D Volume EF: LV SV MOD A2C:   56.5 ml       3D EF:        40 % LV SV MOD A4C:   94.4 ml       LV EDV:       140 ml LV SV MOD BP:    37.7 ml       LV ESV:       84 ml                                LV SV:        56 ml RIGHT VENTRICLE             IVC RV S prime:     25.60 cm/s  IVC diam: 1.90 cm TAPSE (M-mode): 2.4 cm LEFT ATRIUM             Index        RIGHT ATRIUM           Index LA diam:        3.30 cm 2.00 cm/m   RA Area:     13.50 cm LA Vol (A2C):   38.4 ml 23.24 ml/m  RA  Volume:   30.00 ml  18.16 ml/m LA Vol (A4C):   34.0 ml 20.58 ml/m LA Biplane Vol: 36.8 ml 22.27 ml/m  AORTIC VALVE AV Area (Vmax): 2.19 cm AV Vmax:        140.00 cm/s AV Peak Grad:   7.8 mmHg LVOT Vmax:      108.00 cm/s LVOT Vmean:     73.900 cm/s LVOT VTI:       0.179 m  AORTA Ao Root diam: 3.70 cm Ao Asc diam:  3.20 cm MITRAL VALVE MV Area (PHT): 6.27 cm     SHUNTS MV Decel Time: 121 msec     Systemic VTI:  0.18 m MV E velocity: 70.60 cm/s   Systemic Diam: 1.90 cm MV A velocity: 105.00 cm/s MV E/A ratio:  0.67 Kardie Tobb DO Electronically signed by Jerryl Morin DO Signature Date/Time: 02/13/2024/1:25:37 PM    Final      PERTINENT LAB RESULTS: CBC: Recent Labs    02/13/24 0525 02/14/24 0504  WBC 9.4 9.4  HGB 7.7*  7.6*  HCT 24.2* 24.0*  PLT 441* 447*   CMET CMP     Component Value Date/Time   NA 135 02/13/2024 0525   K 3.9 02/13/2024 0525   CL 104 02/13/2024 0525   CO2 22 02/13/2024 0525   GLUCOSE 114 (H) 02/13/2024 0525   BUN 13 02/13/2024 0525   CREATININE 0.62 02/13/2024 0525   CREATININE 0.87 02/07/2024 1122   CALCIUM 8.7 (L) 02/13/2024 0525   PROT 7.1 02/07/2024 1122   ALBUMIN 3.3 (L) 02/07/2024 1122   AST 16 02/07/2024 1122   ALT 20 02/07/2024 1122   ALKPHOS 73 02/07/2024 1122   BILITOT 0.3 02/07/2024 1122   GFRNONAA >60 02/13/2024 0525   GFRNONAA >60 02/07/2024 1122    GFR Estimated Creatinine Clearance: 76.4 mL/min (by C-G formula based on SCr of 0.62 mg/dL). No results for input(s): "LIPASE", "AMYLASE" in the last 72 hours. No results for input(s): "CKTOTAL", "CKMB", "CKMBINDEX", "TROPONINI" in the last 72 hours. Invalid input(s): "POCBNP" No results for input(s): "DDIMER" in the last 72 hours. No results for input(s): "HGBA1C" in the last 72 hours. No results for input(s): "CHOL", "HDL", "LDLCALC", "TRIG", "CHOLHDL", "LDLDIRECT" in the last 72 hours. No results for input(s): "TSH", "T4TOTAL", "T3FREE", "THYROIDAB" in the last 72 hours.  Invalid input(s): "FREET3" No results for input(s): "VITAMINB12", "FOLATE", "FERRITIN", "TIBC", "IRON", "RETICCTPCT" in the last 72 hours. Coags: No results for input(s): "INR" in the last 72 hours.  Invalid input(s): "PT" Microbiology: No results found for this or any previous visit (from the past 240 hours).  FURTHER DISCHARGE INSTRUCTIONS:  Get Medicines reviewed and adjusted: Please take all your medications with you for your next visit with your Primary MD  Laboratory/radiological data: Please request your Primary MD to go over all hospital tests and procedure/radiological results at the follow up, please ask your Primary MD to get all Hospital records sent to his/her office.  In some cases, they will be blood work, cultures  and biopsy results pending at the time of your discharge. Please request that your primary care M.D. goes through all the records of your hospital data and follows up on these results.  Also Note the following: If you experience worsening of your admission symptoms, develop shortness of breath, life threatening emergency, suicidal or homicidal thoughts you must seek medical attention immediately by calling 911 or calling your MD immediately  if symptoms less severe.  You must read complete instructions/literature along  with all the possible adverse reactions/side effects for all the Medicines you take and that have been prescribed to you. Take any new Medicines after you have completely understood and accpet all the possible adverse reactions/side effects.   Do not drive when taking Pain medications or sleeping medications (Benzodaizepines)  Do not take more than prescribed Pain, Sleep and Anxiety Medications. It is not advisable to combine anxiety,sleep and pain medications without talking with your primary care practitioner  Special Instructions: If you have smoked or chewed Tobacco  in the last 2 yrs please stop smoking, stop any regular Alcohol  and or any Recreational drug use.  Wear Seat belts while driving.  Please note: You were cared for by a hospitalist during your hospital stay. Once you are discharged, your primary care physician will handle any further medical issues. Please note that NO REFILLS for any discharge medications will be authorized once you are discharged, as it is imperative that you return to your primary care physician (or establish a relationship with a primary care physician if you do not have one) for your post hospital discharge needs so that they can reassess your need for medications and monitor your lab values.  Total Time spent coordinating discharge including counseling, education and face to face time equals greater than 30 minutes.  Signed: Kimberly Penna 02/15/2024 8:10 AM

## 2024-02-15 NOTE — TOC Transition Note (Signed)
 Transition of Care [facility]) - Discharge Note   Patient Details  Name: Jeff Romero MRN: [id number] Date of Birth: 01-20-1961  [facility] CM/SW Contact:  Eusebio High, RN Phone Number: 02/15/2024, 8:52 AM   Clinical Narrative:    Patient will DC to home today. No TOC needs identified. Patient will follow up as directed on AVS              Patient Goals and CMS Choice            Discharge Placement                       Discharge Plan and Services Additional resources added to the After Visit Summary for                                       Social Drivers of Health (SDOH) Interventions SDOH Screenings   Food Insecurity: No Food Insecurity (02/13/2024)  Housing: Low Risk  (02/13/2024)  Transportation Needs: No Transportation Needs (02/13/2024)  Utilities: Not At Risk (02/13/2024)  Tobacco Use: Medium Risk (02/13/2024)     Readmission Risk Interventions     No data to display

## 2024-02-16 ENCOUNTER — Ambulatory Visit
Admission: RE | Admit: 2024-02-16 | Discharge: 2024-02-16 | Disposition: A | Discharge status: Home / Self Care | Source: Ambulatory Visit | Attending: Radiation Oncology | Admitting: Radiation Oncology

## 2024-02-16 ENCOUNTER — Encounter (HOSPITAL_COMMUNITY): Payer: Self-pay

## 2024-02-16 ENCOUNTER — Other Ambulatory Visit: Payer: Self-pay

## 2024-02-16 ENCOUNTER — Encounter (HOSPITAL_COMMUNITY): Payer: Self-pay | Admitting: Internal Medicine

## 2024-02-16 DIAGNOSIS — Z510 Encounter for antineoplastic radiation therapy: Secondary | ICD-10-CM | POA: Diagnosis present

## 2024-02-16 DIAGNOSIS — C3411 Malignant neoplasm of upper lobe, right bronchus or lung: Secondary | ICD-10-CM | POA: Diagnosis present

## 2024-02-16 DIAGNOSIS — Z87891 Personal history of nicotine dependence: Secondary | ICD-10-CM | POA: Diagnosis not present

## 2024-02-16 LAB — RAD ONC ARIA SESSION SUMMARY
Course Elapsed Days: 8
Plan Fractions Treated to Date: 7
Plan Prescribed Dose Per Fraction: 3 Gy
Plan Total Fractions Prescribed: 10
Plan Total Prescribed Dose: 30 Gy
Reference Point Dosage Given to Date: 21 Gy
Reference Point Session Dosage Given: 3 Gy
Session Number: 7

## 2024-02-17 ENCOUNTER — Ambulatory Visit
Admission: RE | Admit: 2024-02-17 | Discharge: 2024-02-17 | Disposition: A | Discharge status: Home / Self Care | Source: Ambulatory Visit | Attending: Radiation Oncology | Admitting: Radiation Oncology

## 2024-02-17 ENCOUNTER — Other Ambulatory Visit: Payer: Self-pay

## 2024-02-17 DIAGNOSIS — Z510 Encounter for antineoplastic radiation therapy: Secondary | ICD-10-CM | POA: Diagnosis not present

## 2024-02-17 LAB — RAD ONC ARIA SESSION SUMMARY
Course Elapsed Days: 9
Plan Fractions Treated to Date: 8
Plan Prescribed Dose Per Fraction: 3 Gy
Plan Total Fractions Prescribed: 10
Plan Total Prescribed Dose: 30 Gy
Reference Point Dosage Given to Date: 24 Gy
Reference Point Session Dosage Given: 3 Gy
Session Number: 8

## 2024-02-17 NOTE — Progress Notes (Signed)
 Pharmacist Chemotherapy Monitoring - Initial Assessment    Anticipated start date: 02/24/24   The following has been reviewed per standard work regarding the patient's treatment regimen: The patient's diagnosis, treatment plan and drug doses, and organ/hematologic function Lab orders and baseline tests specific to treatment regimen  The treatment plan start date, drug sequencing, and pre-medications Prior authorization status  Patient's documented medication list, including drug-drug interaction screen and prescriptions for anti-emetics and supportive care specific to the treatment regimen The drug concentrations, fluid compatibility, administration routes, and timing of the medications to be used The patient's access for treatment and lifetime cumulative dose history, if applicable  The patient's medication allergies and previous infusion related reactions, if applicable   Changes made to treatment plan:  N/A  Follow up needed:  N/A   Cherylynn Cosier, RPH, 02/17/2024  2:51 PM

## 2024-02-18 ENCOUNTER — Other Ambulatory Visit (HOSPITAL_COMMUNITY): Payer: Self-pay

## 2024-02-18 ENCOUNTER — Ambulatory Visit
Admission: RE | Admit: 2024-02-18 | Discharge: 2024-02-18 | Disposition: A | Discharge status: Home / Self Care | Source: Ambulatory Visit | Attending: Radiation Oncology | Admitting: Radiation Oncology

## 2024-02-18 ENCOUNTER — Other Ambulatory Visit: Payer: Self-pay

## 2024-02-18 DIAGNOSIS — Z510 Encounter for antineoplastic radiation therapy: Secondary | ICD-10-CM | POA: Diagnosis not present

## 2024-02-18 LAB — RAD ONC ARIA SESSION SUMMARY
Course Elapsed Days: 10
Plan Fractions Treated to Date: 9
Plan Prescribed Dose Per Fraction: 3 Gy
Plan Total Fractions Prescribed: 10
Plan Total Prescribed Dose: 30 Gy
Reference Point Dosage Given to Date: 27 Gy
Reference Point Session Dosage Given: 3 Gy
Session Number: 9

## 2024-02-18 LAB — MISCELLANEOUS TEST

## 2024-02-21 ENCOUNTER — Encounter (HOSPITAL_COMMUNITY): Payer: Self-pay | Admitting: Internal Medicine

## 2024-02-21 ENCOUNTER — Ambulatory Visit
Admission: RE | Admit: 2024-02-21 | Discharge: 2024-02-21 | Disposition: A | Discharge status: Home / Self Care | Source: Ambulatory Visit | Attending: Radiation Oncology | Admitting: Radiation Oncology

## 2024-02-21 ENCOUNTER — Other Ambulatory Visit: Payer: Self-pay

## 2024-02-21 DIAGNOSIS — Z510 Encounter for antineoplastic radiation therapy: Secondary | ICD-10-CM | POA: Diagnosis not present

## 2024-02-21 LAB — RAD ONC ARIA SESSION SUMMARY
Course Elapsed Days: 13
Plan Fractions Treated to Date: 10
Plan Prescribed Dose Per Fraction: 3 Gy
Plan Total Fractions Prescribed: 10
Plan Total Prescribed Dose: 30 Gy
Reference Point Dosage Given to Date: 30 Gy
Reference Point Session Dosage Given: 3 Gy
Session Number: 10

## 2024-02-22 ENCOUNTER — Inpatient Hospital Stay

## 2024-02-22 ENCOUNTER — Other Ambulatory Visit (HOSPITAL_COMMUNITY): Payer: Self-pay

## 2024-02-23 ENCOUNTER — Other Ambulatory Visit: Payer: Self-pay

## 2024-02-23 ENCOUNTER — Ambulatory Visit: Admitting: Physician Assistant

## 2024-02-23 ENCOUNTER — Telehealth: Payer: Self-pay

## 2024-02-23 NOTE — Telephone Encounter (Signed)
 Received a message regarding the patient's request to reschedule appointments for tomorrow.  Spoke with the patient's wife, [name], this morning. She stated that the patient would like to reschedule the appointments, as he still has questions about his treatment plan and is uncertain about proceeding until he speaks with Dr. Marguerita Shih. The patient is requesting a telephone office visit with Dr. Marguerita Shih prior to making a decision regarding treatment.  Additionally, the patient is in the process of obtaining a second opinion.  Informed the patient and wife that I will relay their request to Dr. Marguerita Shih and the scheduling team for further evaluation.

## 2024-02-23 NOTE — Radiation Completion Notes (Signed)
  Radiation Oncology         [phone] ________________________________  Name: Jeff Romero MRN: [id number]  Date of Service: 02/21/2024  DOB: 1961/04/17  End of Treatment Note  Diagnosis: Stage IV, cT2bN2M1c, NSCLC, NOS of the RUL  Intent: Palliative     ==========DELIVERED PLANS==========  First Treatment Date: 2024-02-08 Last Treatment Date: 2024-02-21   Plan Name: Lung_R Site: Lung, Right Technique: 3D Mode: Photon Dose Per Fraction: 3 Gy Prescribed Dose (Delivered / Prescribed): 30 Gy / 30 Gy Prescribed Fxs (Delivered / Prescribed): 10 / 10     ==========ON TREATMENT VISIT DATES========== 2024-02-11, 2024-02-18   See weekly On Treatment Notes in Epic for details in the Media tab (listed as Progress notes on the On Treatment Visit Dates listed above).The patient tolerated radiation. He developed fatigue and esophagitis during therapy. The patient will receive a call in about one month from the radiation oncology department. He will continue follow up with Dr. Marguerita Shih as well.      Shelvia Dick, PAC

## 2024-02-24 ENCOUNTER — Inpatient Hospital Stay

## 2024-02-24 ENCOUNTER — Inpatient Hospital Stay: Admitting: Internal Medicine

## 2024-02-29 NOTE — Progress Notes (Signed)
 Statement of Medical Necessity as requested by [facility] faxed to [facility] at [phone]

## 2024-03-01 ENCOUNTER — Other Ambulatory Visit: Payer: Self-pay | Admitting: *Deleted

## 2024-03-01 ENCOUNTER — Inpatient Hospital Stay (HOSPITAL_BASED_OUTPATIENT_CLINIC_OR_DEPARTMENT_OTHER): Admitting: Internal Medicine

## 2024-03-01 ENCOUNTER — Inpatient Hospital Stay

## 2024-03-01 VITALS — BP 117/74 | HR 128 | Temp 98.7°F | Resp 17 | Ht 67.0 in | Wt 117.7 lb

## 2024-03-01 DIAGNOSIS — Z87891 Personal history of nicotine dependence: Secondary | ICD-10-CM | POA: Diagnosis not present

## 2024-03-01 DIAGNOSIS — Z79899 Other long term (current) drug therapy: Secondary | ICD-10-CM | POA: Diagnosis not present

## 2024-03-01 DIAGNOSIS — C61 Malignant neoplasm of prostate: Secondary | ICD-10-CM | POA: Diagnosis not present

## 2024-03-01 DIAGNOSIS — C7951 Secondary malignant neoplasm of bone: Secondary | ICD-10-CM | POA: Diagnosis present

## 2024-03-01 DIAGNOSIS — C778 Secondary and unspecified malignant neoplasm of lymph nodes of multiple regions: Secondary | ICD-10-CM | POA: Diagnosis not present

## 2024-03-01 DIAGNOSIS — C3411 Malignant neoplasm of upper lobe, right bronchus or lung: Secondary | ICD-10-CM

## 2024-03-01 DIAGNOSIS — E860 Dehydration: Secondary | ICD-10-CM | POA: Diagnosis not present

## 2024-03-01 MED ORDER — SUCRALFATE 1 G PO TABS: 1.0000 g | ORAL_TABLET | Freq: Three times a day (TID) | ORAL | 1 refills | Status: DC

## 2024-03-01 MED ORDER — SODIUM CHLORIDE 0.9 % IV SOLN: INTRAVENOUS | Status: AC

## 2024-03-01 NOTE — Progress Notes (Signed)
 DISCONTINUE OFF PATHWAY REGIMEN - Non-Small Cell Lung   [id number]:Pembrolizumab 200 mg  IV D1 + Pemetrexed 500 mg/m2 IV D1 + Carboplatin AUC=5 IV D1 q21 Days:   A cycle is every 21 days:     Pembrolizumab      Pemetrexed      Carboplatin   **Always confirm dose/schedule in your pharmacy ordering system**  PRIOR TREATMENT: Off Pathway: Pembrolizumab 200 mg  IV D1 + Pemetrexed 500 mg/m2 IV D1 + Carboplatin AUC=5 IV D1 q21 Days  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   [id number]:Pembrolizumab 200 mg IV D1 q21 Days:   A cycle is every 21 days:     Pembrolizumab   **Always confirm dose/schedule in your pharmacy ordering system**  Patient Characteristics: Stage IV Metastatic, Nonsquamous, Molecular Analysis Completed, Molecular Alteration Present and Targeted Therapy Exhausted OR KRAS G12C+ or HER2+ or NRG1+ Present and No Prior Chemo/Immunotherapy OR No Alteration Present, Initial  Chemotherapy/Immunotherapy, PS = 0, 1, No Alteration Present, No Alteration Present, Candidate for Immunotherapy, PD-L1 Expression Positive 1-49% (TPS) / Negative / Not Tested / Awaiting Test Results and Immunotherapy Candidate Therapeutic Status: Stage IV Metastatic Histology: Nonsquamous Cell Broad Molecular Profiling Status: Molecular Analysis Completed Molecular Analysis Results: No Alteration Present ECOG Performance Status: 1 Chemotherapy/Immunotherapy Line of Therapy: Initial Chemotherapy/Immunotherapy EGFR Exons 18-21 Mutation Testing Status: Completed and Negative ALK Fusion/Rearrangement Testing Status: Completed and Negative BRAF V600 Mutation Testing Status: Completed and Negative KRAS G12C Mutation Testing Status: Completed and Negative MET Exon 14 Mutation Testing Status: Completed and Negative RET Fusion/Rearrangement Testing Status: Completed and Negative NRG1 Fusion/Rearrangement Testing Status: Completed and Negative HER2 Mutation Testing Status: Completed and Negative NTRK  Fusion/Rearrangement Testing Status: Completed and Negative ROS1 Fusion/Rearrangement Testing Status: Completed and Negative Immunotherapy Candidate Status: Candidate for Immunotherapy PD-L1 Expression Status: PD-L1 Positive 1-49% (TPS) Intent of Therapy: Non-Curative / Palliative Intent, Discussed with Patient

## 2024-03-01 NOTE — Progress Notes (Signed)
 [facility] [phone]   [phone]  OFFICE PROGRESS NOTE  Center, [facility] [address] 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   Biomarker Findings HRD signature - HRDsig Negative Microsatellite status - MS-Stable Tumor Mutational Burden - 6 Muts/Mb Genomic Findings For a complete list of the genes assayed, please refer to the Appendix. KRAS G12D STK11 splice site [id number] MCL1 amplification RICTOR amplification TP53 V157F 7 Disease relevant genes with no reportable alterations: ALK, BRAF, EGFR, ERBB2, MET, RET, ROS1  PDL1 TPS 60%.  PRIOR THERAPY:  radiotherapy to the large right upper lobe lung mass.  CURRENT THERAPY: Systemic therapy with immunotherapy with Keytruda 200 Mg IV every 3 weeks.  First dose 03/08/2024  INTERVAL HISTORY: Jeff Romero 63 y.o. male returns to the clinic today for follow-up visit accompanied by his wife.Discussed the use of AI scribe software for clinical note transcription with the patient, who gave verbal consent to proceed.  History of Present Illness   Jeff Romero is a 63 year old male with stage four non-small cell lung cancer who presents for a follow-up regarding his treatment options. He is accompanied by his wife.  He was diagnosed with stage four non-small cell lung cancer in April 2025. He has undergone palliative radiotherapy to a large right upper lobe lung mass . Molecular studies showed no actionable mutations, but the PD-L1 marker was high at 60%.  He experiences significant pain when swallowing, impacting his ability to eat and resulting in a weight loss of  approximately eight pounds. He is currently taking oxycodone  for pain management, but it is not adequately controlling his symptoms.  He was hospitalized a couple of weeks ago due to a pulmonary embolism and is now on Eliquis , taken twice daily.  He experiences a persistent cough that is not relieved by his current medication, lisinopril. He is also experiencing dehydration and has an elevated heart rate.  His caregiver is focused on improving his diet, emphasizing the intake of vegetables, fruits, and proteins while eliminating junk food.       MEDICAL HISTORY: Past Medical History:  Diagnosis Date   Allergy    Anxiety    Arthritis    Blood transfusion without reported diagnosis    Cataract    Depression    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   Prostate cancer (HCC)     ALLERGIES:  has no known allergies.  MEDICATIONS:  Current Outpatient Medications  Medication Sig Dispense Refill   acetaminophen  (TYLENOL ) 500 MG tablet Take 2 tablets (1,000 mg total) by mouth every 8 (eight) hours.     albuterol  (VENTOLIN  HFA) 108 (90 Base) MCG/ACT inhaler Inhale 1 puff into the lungs every 6 (six) hours as needed for wheezing or shortness of breath.     apixaban  (ELIQUIS ) 5 MG TABS tablet Take 2 tablets (10 mg) by mouth twice daily though 5/18.  On 5/19-switch to 1 tablet (5 mg) twice daily. 120 tablet 3   baclofen  (LIORESAL ) 20 MG tablet Take 1 tablet (20 mg total) by mouth 3 (three) times daily. 30 each 0   benzonatate  (TESSALON ) 200 MG capsule Take 1 capsule (200 mg  total) by mouth 3 (three) times daily. 90 capsule 0   desipramine (NORPRAMIN) 25 MG tablet Take 25 mg by mouth daily.     diclofenac  (VOLTAREN ) 75 MG EC tablet Take 1 tablet (75 mg total) by mouth 2 (two) times daily. 30 tablet 0   fenofibrate 160 MG tablet Take 160 mg by mouth daily.     guaiFENesin -codeine  100-10 MG/5ML syrup Take 10 mLs by mouth 3 (three) times daily as needed for cough. 237 mL 0    ondansetron  (ZOFRAN ) 4 MG tablet Take 1 tablet (4 mg total) by mouth every 6 (six) hours as needed for nausea. 20 tablet 0   OVER THE COUNTER MEDICATION Unknown eye drop and directions     Oxycodone  HCl 10 MG TABS Take 1 tablet (10 mg total) by mouth every 6 (six) hours as needed for moderate pain (pain score 4-6). 25 tablet 0   pantoprazole  (PROTONIX ) 40 MG tablet Take 1 tablet (40 mg total) by mouth daily. 30 tablet 0   polyethylene glycol powder (GLYCOLAX /MIRALAX ) 17 GM/SCOOP powder Take 17 g by mouth daily. 476 g 1   prochlorperazine  (COMPAZINE ) 10 MG tablet Take 1 tablet (10 mg total) by mouth every 6 (six) hours as needed for nausea or vomiting. 30 tablet 1   rosuvastatin  (CRESTOR ) 10 MG tablet Take 10 mg by mouth daily.     senna-docusate (SENOKOT-S) 8.6-50 MG tablet Take 2 tablets by mouth at bedtime. 60 tablet 2   No current facility-administered medications for this visit.    SURGICAL HISTORY:  Past Surgical History:  Procedure Laterality Date   BRONCHIAL BIOPSY  01/21/2024   Procedure: BRONCHOSCOPY, WITH BIOPSY;  Surgeon: Joesph Mussel, DO;  Location: [facility];  Service: Cardiopulmonary;;   BRONCHIAL BRUSHINGS  01/21/2024   Procedure: BRONCHOSCOPY, WITH BRUSH BIOPSY;  Surgeon: Joesph Mussel, DO;  Location: [facility];  Service: Cardiopulmonary;;   FLEXIBLE BRONCHOSCOPY N/A 01/21/2024   Procedure: Cordella Deter;  Surgeon: Joesph Mussel, DO;  Location: [facility];  Service: Cardiopulmonary;  Laterality: N/A;   INGUINAL HERNIA REPAIR Right 09/18/2021   Procedure: OPEN RIGHT INGUINAL HERNIA REPAIR WITH MESH;  Surgeon: Kinsinger, Alphonso Aschoff, MD;  Location: [facility];  Service: General;  Laterality: Right;   KNEE CARTILAGE SURGERY Right    PROSTATE SURGERY     ROTATOR CUFF REPAIR Right     REVIEW OF SYSTEMS:  Constitutional: positive for anorexia, fatigue, and weight loss Eyes: negative Ears, nose, mouth, throat, and face: negative Respiratory: positive for cough  and dyspnea on exertion Cardiovascular: negative Gastrointestinal: negative Genitourinary:negative Integument/breast: negative Hematologic/lymphatic: negative Musculoskeletal:positive for muscle weakness Neurological: negative Behavioral/Psych: negative Endocrine: negative Allergic/Immunologic: negative   PHYSICAL EXAMINATION: General appearance: alert, cooperative, cachectic, fatigued, and no distress Head: Normocephalic, without obvious abnormality, atraumatic Neck: no adenopathy, no JVD, supple, symmetrical, trachea midline, and thyroid not enlarged, symmetric, no tenderness/mass/nodules Lymph nodes: Cervical, supraclavicular, and axillary nodes normal. Resp: clear to auscultation bilaterally Back: symmetric, no curvature. ROM normal. No CVA tenderness. Cardio: regular rate and rhythm, S1, S2 normal, no murmur, click, rub or gallop GI: soft, non-tender; bowel sounds normal; no masses,  no organomegaly Extremities: extremities normal, atraumatic, no cyanosis or edema Neurologic: Alert and oriented X 3, normal strength and tone. Normal symmetric reflexes. Normal coordination and gait  ECOG PERFORMANCE STATUS: 1 - Symptomatic but completely ambulatory  Blood pressure 117/74, pulse (!) 127, temperature 98.7 F (37.1 C), temperature source Temporal, resp. rate 17, height 5\' 7"  (1.702 m), weight 117  lb 11.2 oz (53.4 kg), SpO2 99%.  LABORATORY DATA: Lab Results  Component Value Date   WBC 9.4 02/14/2024   HGB 7.6 (L) 02/14/2024   HCT 24.0 (L) 02/14/2024   MCV 77.7 (L) 02/14/2024   PLT 447 (H) 02/14/2024      Chemistry      Component Value Date/Time   NA 135 02/13/2024 0525   K 3.9 02/13/2024 0525   CL 104 02/13/2024 0525   CO2 22 02/13/2024 0525   BUN 13 02/13/2024 0525   CREATININE 0.62 02/13/2024 0525   CREATININE 0.87 02/07/2024 1122      Component Value Date/Time   CALCIUM  8.7 (L) 02/13/2024 0525   ALKPHOS 73 02/07/2024 1122   AST 16 02/07/2024 1122   ALT 20  02/07/2024 1122   BILITOT 0.3 02/07/2024 1122       RADIOGRAPHIC STUDIES: ECHOCARDIOGRAM COMPLETE Result Date: 02/13/2024    ECHOCARDIOGRAM REPORT   Patient Name:   Jeff Romero Date of Exam: 02/13/2024 Medical Rec #:  [id number]   Height:       67.0 in Accession #:    [id number]  Weight:       124.3 lb Date of Birth:  May 07, 1961    BSA:          1.652 m Patient Age:    62 years    BP:           125/77 mmHg Patient Gender: M           HR:           108 bpm. Exam Location:  Inpatient Procedure: 2D Echo, 3D Echo, Cardiac Doppler and Color Doppler (Both Spectral            and Color Flow Doppler were utilized during procedure). Indications:    Pulmonary Embolus I26.09  History:        Patient has no prior history of Echocardiogram examinations.  Sonographer:    Terrilee Few RCS Referring Phys: Beverley Buck PATEL IMPRESSIONS  1. Left ventricular ejection fraction, by estimation, is 40 to 45%. Left ventricular ejection fraction by 3D volume is 40 %. The left ventricle has mildly decreased function. The left ventricle demonstrates global hypokinesis. Left ventricular diastolic  parameters are consistent with Grade I diastolic dysfunction (impaired relaxation).  2. Right ventricular systolic function is normal. The right ventricular size is normal. Tricuspid regurgitation signal is inadequate for assessing PA pressure.  3. A small pericardial effusion is present. The pericardial effusion is posterior to the left ventricle.  4. The mitral valve is normal in structure. Moderate mitral valve regurgitation. No evidence of mitral stenosis.  5. Tricuspid valve regurgitation is moderate.  6. The aortic valve is normal in structure. Aortic valve regurgitation is mild. No aortic stenosis is present.  7. The inferior vena cava is normal in size with greater than 50% respiratory variability, suggesting right atrial pressure of 3 mmHg. FINDINGS  Left Ventricle: Left ventricular ejection fraction, by estimation, is 40 to 45%. Left  ventricular ejection fraction by 3D volume is 40 %. The left ventricle has mildly decreased function. The left ventricle demonstrates global hypokinesis. The left ventricular internal cavity size was normal in size. There is no left ventricular hypertrophy. Left ventricular diastolic parameters are consistent with Grade I diastolic dysfunction (impaired relaxation). Right Ventricle: The right ventricular size is normal. No increase in right ventricular wall thickness. Right ventricular systolic function is normal. Tricuspid regurgitation signal is inadequate for assessing PA pressure. Left Atrium:  Left atrial size was normal in size. Right Atrium: Right atrial size was normal in size. Pericardium: A small pericardial effusion is present. The pericardial effusion is posterior to the left ventricle. Mitral Valve: The mitral valve is normal in structure. Moderate mitral valve regurgitation. No evidence of mitral valve stenosis. Tricuspid Valve: The tricuspid valve is normal in structure. Tricuspid valve regurgitation is moderate . No evidence of tricuspid stenosis. Aortic Valve: The aortic valve is normal in structure. Aortic valve regurgitation is mild. No aortic stenosis is present. Aortic valve peak gradient measures 7.8 mmHg. Pulmonic Valve: The pulmonic valve was not well visualized. Pulmonic valve regurgitation is trivial. No evidence of pulmonic stenosis. Aorta: The aortic root is normal in size and structure. Venous: The inferior vena cava is normal in size with greater than 50% respiratory variability, suggesting right atrial pressure of 3 mmHg. IAS/Shunts: No atrial level shunt detected by color flow Doppler. Additional Comments: 3D was performed not requiring image post processing on an independent workstation and was abnormal.  LEFT VENTRICLE PLAX 2D LVIDd:         5.50 cm         Diastology LVIDs:         4.30 cm         LV e' medial:    9.90 cm/s LV PW:         2.05 cm         LV E/e' medial:  7.1 LV IVS:         0.50 cm         LV e' lateral:   14.80 cm/s LVOT diam:     1.90 cm         LV E/e' lateral: 4.8 LV SV:         51 LV SV Index:   31 LVOT Area:     2.84 cm        3D Volume EF                                LV 3D EF:    Left                                             ventricul LV Volumes (MOD)                            ar LV vol d, MOD    116.0 ml                   ejection A2C:                                        fraction LV vol d, MOD    94.4 ml                    by 3D A4C:                                        volume is LV vol s, MOD    59.5 ml  40 %. A2C: LV vol s, MOD    63.3 ml A4C:                           3D Volume EF: LV SV MOD A2C:   56.5 ml       3D EF:        40 % LV SV MOD A4C:   94.4 ml       LV EDV:       140 ml LV SV MOD BP:    37.7 ml       LV ESV:       84 ml                                LV SV:        56 ml RIGHT VENTRICLE             IVC RV S prime:     25.60 cm/s  IVC diam: 1.90 cm TAPSE (M-mode): 2.4 cm LEFT ATRIUM             Index        RIGHT ATRIUM           Index LA diam:        3.30 cm 2.00 cm/m   RA Area:     13.50 cm LA Vol (A2C):   38.4 ml 23.24 ml/m  RA Volume:   30.00 ml  18.16 ml/m LA Vol (A4C):   34.0 ml 20.58 ml/m LA Biplane Vol: 36.8 ml 22.27 ml/m  AORTIC VALVE AV Area (Vmax): 2.19 cm AV Vmax:        140.00 cm/s AV Peak Grad:   7.8 mmHg LVOT Vmax:      108.00 cm/s LVOT Vmean:     73.900 cm/s LVOT VTI:       0.179 m  AORTA Ao Root diam: 3.70 cm Ao Asc diam:  3.20 cm MITRAL VALVE MV Area (PHT): 6.27 cm     SHUNTS MV Decel Time: 121 msec     Systemic VTI:  0.18 m MV E velocity: 70.60 cm/s   Systemic Diam: 1.90 cm MV A velocity: 105.00 cm/s MV E/A ratio:  0.67 Kardie Tobb DO Electronically signed by Jerryl Morin DO Signature Date/Time: 02/13/2024/1:25:37 PM    Final    CT Angio Chest PE W and/or Wo Contrast Result Date: 02/12/2024 CLINICAL DATA:  Shortness of breath.  Lung cancer. EXAM: CT ANGIOGRAPHY CHEST WITH CONTRAST TECHNIQUE:  Multidetector CT imaging of the chest was performed using the standard protocol during bolus administration of intravenous contrast. Multiplanar CT image reconstructions and MIPs were obtained to evaluate the vascular anatomy. RADIATION DOSE REDUCTION: This exam was performed according to the departmental dose-optimization program which includes automated exposure control, adjustment of the mA and/or kV according to patient size and/or use of iterative reconstruction technique. CONTRAST:  75mL OMNIPAQUE  IOHEXOL  350 MG/ML SOLN COMPARISON:  Chest CT 01/20/2024 and PET-CT 02/01/2019 FINDINGS: Cardiovascular: The heart is within normal limits in size. No pericardial effusion. Stable fluid in the pericardial recesses. The aorta is normal in caliber. No dissection. Scattered atherosclerotic calcifications. Scattered coronary artery calcifications. The pulmonary arterial tree is well opacified. Significant bilateral second and third order pulmonary emboli. No findings for significant right heart strain. The RV LV ratio is less than 1. Mediastinum/Nodes: Stable mediastinal and hilar adenopathy. The esophagus is grossly  normal. Lungs/Pleura: Large necrotic right upper lobe lung mass appears relatively stable. Stable underlying advanced emphysematous changes with areas of pulmonary scarring. New small right pleural effusion. Right lower lobe airspace opacity, likely infiltrate. Suspect new right lower lobe pulmonary nodule on image 105/9 measuring 9 mm. Significant interval enlargement a left lower lobe pulmonary nodule. This measures 9 mm on image 104/9. Upper Abdomen: Stable low-attenuation lesions in the right hepatic lobe consistent with a benign cyst. No adrenal gland lesions or upper abdominal adenopathy. Musculoskeletal: No chest wall abnormality. No acute or significant osseous findings. Review of the MIP images confirms the above findings. IMPRESSION: 1. Significant bilateral second and third order pulmonary emboli.  No findings for right heart strain. 2. Large necrotic right upper lobe lung mass appears relatively stable. 3. Stable mediastinal and hilar adenopathy. 4. New small right pleural effusion. 5. Right lower lobe airspace opacity, likely infiltrate. 6. New/enlarging right lower lobe and left lower lobe nodules. 7. No significant upper abdominal findings. Aortic Atherosclerosis (ICD10-I70.0) and Emphysema (ICD10-J43.9). These results were called by telephone at the time of interpretation on 02/12/2024 at 8:36 pm to provider ANDREW TEE , who verbally acknowledged these results. Electronically Signed   By: Marrian Siva M.D.   On: 02/12/2024 20:36   DG Chest Port 1 View Result Date: 02/12/2024 CLINICAL DATA:  Chest pain, lung cancer. EXAM: PORTABLE CHEST 1 VIEW COMPARISON:  None Available. FINDINGS: The heart size and mediastinal contours are within normal limits. Right upper lung masslike alveolar process has become more dense as compared to the previous examination. There is interstitial prominence in the right lower lung. Left lung is clear. No pneumothorax or pleural effusion. IMPRESSION: 1. Right upper lung masslike alveolar process has become more dense. 2. Interstitial prominence in the right lower lung. Electronically Signed   By: Sydell Eva M.D.   On: 02/12/2024 20:11   NM PET Image Initial (PI) Skull Base To Thigh Result Date: 02/02/2024 CLINICAL DATA:  Initial treatment strategy for non-small-cell lung cancer. EXAM: NUCLEAR MEDICINE PET SKULL BASE TO THIGH TECHNIQUE: 6.169 mCi F-18 FDG was injected intravenously. Full-ring PET imaging was performed from the skull base to thigh after the radiotracer. CT data was obtained and used for attenuation correction and anatomic localization. Fasting blood glucose: 92 mg/dl COMPARISON:  Chest CT with contrast 01/20/2024 FINDINGS: Mediastinal blood pool activity: SUV max 2.1 Liver activity: SUV max 2.2 NECK: Supraclavicular lymph node mass identified on the right  side with maximum SUV value of 8.6. This is seen just to the right of the right thyroid lobe and on series 4, image 43 of the CT scan measures 2.9 by 2.3 cm. No additional abnormal areas of uptake identified in the neck including along other lymph node chains. Near symmetric uptake of radiotracer along the visualized intracranial compartment. Incidental CT findings: Visualized portions of the paranasal sinuses and mastoid air cells are clear. The parotid glands, submandibular glands and thyroid glands unremarkable. Scattered vascular calcifications. CHEST: As seen on the prior CT scan there is a large heterogeneous centrally necrotic/cavitary mass in the right upper lobe that extends out to the pleural margin posterolaterally. Maximum SUV value of 12.6. The central mass on contrast CT scan was measured at 4.9 x 4.7 cm. There is confluent tissue extending to the hilum which has a nodular contour as well. This could be a combination of tumor and lymph node. This would have maximum SUV value approaching 12.1 and mass is measured on CT image 69 measures 4.0  x 4.2 cm. There is a associated nodular mass within the course of the right main bronchus with occlusion. Several satellite lesions are identified elsewhere in the right upper lobe. Additional components of reticulonodular changes in the right upper lobe which could represent lymphangitic spread of disease. Additional hypermetabolic nodes identified including along the internal mammary chain on the right with maximum SUV value of 12.0. Mass on CT image 60 in this location measures 3.8 by 2.4 cm. Additional abnormal nodes right paratracheal, subcarinal. The right paratracheal lesion on CT image 66 measures 3.2 by 2.6 cm and has maximum SUV approaching 9.0. Subcarinal lesion has maximum SUV 9.3. Of note there are no areas of abnormal uptake along left-sided nodes in the mediastinum or left hilum. No axillary nodes. In addition no abnormal uptake in the left lung.  Incidental CT findings: Heart is nonenlarged. No pericardial effusion. The thoracic aorta has a normal course and caliber. Scattered calcified plaque at the aortic arch. Breathing motion. Underlying centrilobular and paraseptal emphysematous changes. Apical subpleural blebs. ABDOMEN/PELVIS: There is physiologic distribution radiotracer along the parenchymal organs, bowel and renal collecting systems. Incidental CT findings: Benign right hepatic lobe cysts. Minimal thickening of left adrenal gland but no uptake. No renal or ureteral stones identified. Bowel is nondilated. Scattered stool. Gallbladder is nondilated. Motion. Diffuse vascular calcifications. SKELETON: Abnormal uptake identified along a lytic lesion in the right iliac bone with maximum SUV value of 5.3 consistent with a bone metastasis. No other clear hypermetabolic bone lesions identified in the visualized osseous structures. Incidental CT findings: Mild curvature of the lumbar spine. Scattered degenerative changes of the spine and pelvis. IMPRESSION: As seen on prior CT scan centrally necrotic large right upper lobe hypermetabolic mass identified with nodular tissue tracking to the hilum and associated bronchial occlusion. There are several slightly lesions in the right upper lobe as well with reticulonodular changes seen of the lungs which could represent lymphangitic spread of disease. Several areas of abnormal hypermetabolic nodes including subcarinal, right hilar, right paratracheal as well as supraclavicular on the right and right internal mammary chain. No left-sided hypermetabolic hilar or mediastinal nodes. Solitary hypermetabolic lesion along the right iliac bone with a lucent lesion on CT worrisome for a solitary bone metastasis. Electronically Signed   By: Adrianna Horde M.D.   On: 02/02/2024 16:39   MR BRAIN W WO CONTRAST Addendum Date: 02/01/2024 ADDENDUM REPORT: 02/01/2024 12:13 ADDENDUM: Study discussed by telephone with Dr. Alisia Apple  on 02/01/2024 at 1158 hours. Electronically Signed   By: Marlise Simpers M.D.   On: 02/01/2024 12:13   Result Date: 02/01/2024 CLINICAL DATA:  63 year old male with recently diagnosed large right upper lobe lung mass. Staging. By report no neurologic symptoms at this time. EXAM: MRI HEAD WITHOUT AND WITH CONTRAST TECHNIQUE: Multiplanar, multiecho pulse sequences of the brain and surrounding structures were obtained without and with intravenous contrast. CONTRAST:  6mL GADAVIST  GADOBUTROL  1 MMOL/ML IV SOLN COMPARISON:  None Available. FINDINGS: Brain: No abnormal enhancement identified. No dural thickening. No midline shift, mass effect, or evidence of intracranial mass lesion. But positive for a small clustered, slightly linear area of abnormal diffusion mostly subcortical white matter at the junction of the left posterosuperior and middle frontal gyri (series 5, image 35 and series 13, images 17 and 18). Faint associated FLAIR hyperintensity there. No enhancement there. No evidence of hemorrhage. No mass effect. No other restricted diffusion to suggest acute infarction. No ventriculomegaly, extra-axial collection or acute intracranial hemorrhage. Cervicomedullary junction and pituitary  are within normal limits. Elsewhere gray and white matter signal is within normal limits for age. No cortical encephalomalacia or chronic cerebral blood products identified. Vascular: Major intracranial vascular flow voids are preserved, the distal left vertebral artery appears to be dominant (normal variant). Following contrast the major dural venous sinuses are enhancing and appear to be patent. Skull and upper cervical spine: Heterogeneous bone marrow signal in the visible upper cervical spine, but no destructive or definitely enhancing osseous lesion is identified. Negative visible cervical spinal cord. Sinuses/Orbits: Negative orbits. Paranasal sinuses and mastoids are stable and well aerated. Other: Visible internal auditory  structures appear normal. IMPRESSION: 1. Positive for subcentimeter non-enhancing focus of abnormal diffusion in the left frontal lobe. Uvaldo Garfinkel this is a clinically silent small-vessel infarct, rather than tiny metastasis. No associated hemorrhage or mass effect. Recommend repeat MRI without and with contrast in 3 months to re-evaluate. 2. Otherwise negative for age MRI appearance of the Brain. Heterogeneous but probably benign bone marrow signal in the visible cervical spine. Electronically Signed: By: Marlise Simpers M.D. On: 02/01/2024 11:45    ASSESSMENT AND PLAN: This is a very pleasant 63 years old African-American male with stage IV (t2b, N3, M1 B) non-small cell lung cancer presented with large right upper lobe lung mass in addition to right hilar, subcarinal, right paratracheal, right internal mammary as well as right supraclavicular lymphadenopathy in addition to metastatic bone lesion involving the right iliac bone diagnosed in April 2025.  Molecular studies showed no actionable mutations and PD-L1 expression was 60%. I had a lengthy discussion with the patient and his wife about his condition and treatment options.  He has some concern about systemic chemotherapy. 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 Post palliative radiotherapy to the large right upper lobe lung mass. Molecular studies showed no actionable mutation, but PD-L1 expression is high at 60%, making him eligible for immunotherapy. He is concerned about chemotherapy due to weakness and fatigue. Immunotherapy alone is a viable option given the PD-L1 expression. Keytruda (pembrolizumab) can be administered every three weeks, with a potential risk of inflammation in any organ. If the response is inadequate, chemotherapy may be added to potentiate the effect of immunotherapy. - Initiate Keytruda (pembrolizumab) immunotherapy  every three weeks. - Perform scans every nine weeks to assess response to treatment. - Refer to palliative care for pain management.  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 He is experiencing significant pain, impacting nutritional intake and weight. - Refer to palliative care for comprehensive pain management. - Continue oxycodone  until palliative care consultation.  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 This condition is expected to improve over time. - Prescribe Carafate (sucralfate) to be taken half an hour before meals to coat the esophagus.  Dehydration Dehydration with elevated heart rate, contributing to weakness and fatigue. - Administer IV fluids to address dehydration and elevated heart rate.  Pulmonary embolism Pulmonary embolism managed with Eliquis  (apixaban ).  Goals of Care He is aware that the treatment is palliative and not curative, aimed at prolonging life and improving quality of life. He desires full code status, indicating a preference for resuscitation and all life-sustaining measures in the event of cardiac or respiratory arrest. - Document full code status in medical records. -  Discuss and review goals of care with palliative care team.   The patient was advised to call immediately if he has any other concerning symptoms in the interval. The patient voices understanding of current disease status and treatment options and is in agreement with the current care plan.  All questions were answered. The patient knows to call the clinic with any problems, questions or concerns. We can certainly see the patient much sooner if necessary.  The total time spent in the appointment was 55 minutes including review of chart and various tests results, discussions about plan of care and  coordination of care plan .   Disclaimer: This note was dictated with voice recognition software. Similar sounding words can inadvertently be transcribed and may not be corrected upon review.

## 2024-03-01 NOTE — Patient Instructions (Signed)

## 2024-03-02 ENCOUNTER — Other Ambulatory Visit

## 2024-03-08 ENCOUNTER — Encounter: Payer: Self-pay | Admitting: Internal Medicine

## 2024-03-08 ENCOUNTER — Other Ambulatory Visit: Payer: Self-pay | Admitting: Physician Assistant

## 2024-03-08 DIAGNOSIS — C3411 Malignant neoplasm of upper lobe, right bronchus or lung: Secondary | ICD-10-CM

## 2024-03-09 ENCOUNTER — Other Ambulatory Visit

## 2024-03-10 ENCOUNTER — Other Ambulatory Visit: Payer: Self-pay

## 2024-03-13 ENCOUNTER — Other Ambulatory Visit: Payer: Self-pay | Admitting: Physician Assistant

## 2024-03-13 ENCOUNTER — Encounter: Payer: Self-pay | Admitting: Internal Medicine

## 2024-03-13 ENCOUNTER — Inpatient Hospital Stay

## 2024-03-13 VITALS — BP 102/77 | HR 119 | Temp 98.0°F | Resp 20 | Ht 67.0 in | Wt 114.5 lb

## 2024-03-13 DIAGNOSIS — R0989 Other specified symptoms and signs involving the circulatory and respiratory systems: Secondary | ICD-10-CM

## 2024-03-13 DIAGNOSIS — Z5112 Encounter for antineoplastic immunotherapy: Secondary | ICD-10-CM | POA: Insufficient documentation

## 2024-03-13 DIAGNOSIS — C3411 Malignant neoplasm of upper lobe, right bronchus or lung: Secondary | ICD-10-CM | POA: Insufficient documentation

## 2024-03-13 DIAGNOSIS — R000 Tachycardia, unspecified: Secondary | ICD-10-CM

## 2024-03-13 DIAGNOSIS — D649 Anemia, unspecified: Secondary | ICD-10-CM | POA: Insufficient documentation

## 2024-03-13 DIAGNOSIS — Z79620 Long term (current) use of immunosuppressive biologic: Secondary | ICD-10-CM | POA: Insufficient documentation

## 2024-03-13 DIAGNOSIS — Z87891 Personal history of nicotine dependence: Secondary | ICD-10-CM | POA: Insufficient documentation

## 2024-03-13 DIAGNOSIS — Z79899 Other long term (current) drug therapy: Secondary | ICD-10-CM | POA: Insufficient documentation

## 2024-03-13 DIAGNOSIS — E876 Hypokalemia: Secondary | ICD-10-CM | POA: Diagnosis not present

## 2024-03-13 DIAGNOSIS — C7951 Secondary malignant neoplasm of bone: Secondary | ICD-10-CM | POA: Insufficient documentation

## 2024-03-13 DIAGNOSIS — Z510 Encounter for antineoplastic radiation therapy: Secondary | ICD-10-CM | POA: Insufficient documentation

## 2024-03-13 DIAGNOSIS — I82890 Acute embolism and thrombosis of other specified veins: Secondary | ICD-10-CM | POA: Diagnosis not present

## 2024-03-13 LAB — CMP (CANCER CENTER ONLY)
ALT: 18 U/L (ref 0–44)
AST: 16 U/L (ref 15–41)
Albumin: 3.3 g/dL — ABNORMAL LOW (ref 3.5–5.0)
Alkaline Phosphatase: 108 U/L (ref 38–126)
Anion gap: 11 (ref 5–15)
BUN: 13 mg/dL (ref 8–23)
CO2: 26 mmol/L (ref 22–32)
Calcium: 9.3 mg/dL (ref 8.9–10.3)
Chloride: 98 mmol/L (ref 98–111)
Creatinine: 0.71 mg/dL (ref 0.61–1.24)
GFR, Estimated: >60 mL/min (ref >60)
Glucose, Bld: 137 mg/dL — ABNORMAL HIGH (ref 70–99)
Potassium: 3.6 mmol/L (ref 3.5–5.1)
Sodium: 135 mmol/L (ref 135–145)
Total Bilirubin: 0.3 mg/dL (ref 0.0–1.2)
Total Protein: 7.5 g/dL (ref 6.5–8.1)

## 2024-03-13 LAB — CBC WITH DIFFERENTIAL (CANCER CENTER ONLY)
Abs Immature Granulocytes: 0.06 10*3/uL (ref 0.00–0.07)
Basophils Absolute: 0.1 10*3/uL (ref 0.0–0.1)
Basophils Relative: 1 %
Eosinophils Absolute: 0 10*3/uL (ref 0.0–0.5)
Eosinophils Relative: 0 %
HCT: 23.3 % — ABNORMAL LOW (ref 39.0–52.0)
Hemoglobin: 7 g/dL — ABNORMAL LOW (ref 13.0–17.0)
Immature Granulocytes: 1 %
Lymphocytes Relative: 4 %
Lymphs Abs: 0.4 10*3/uL — ABNORMAL LOW (ref 0.7–4.0)
MCH: 22.9 pg — ABNORMAL LOW (ref 26.0–34.0)
MCHC: 30 g/dL (ref 30.0–36.0)
MCV: 76.1 fL — ABNORMAL LOW (ref 80.0–100.0)
Monocytes Absolute: 0.7 10*3/uL (ref 0.1–1.0)
Monocytes Relative: 7 %
Neutro Abs: 8.5 10*3/uL — ABNORMAL HIGH (ref 1.7–7.7)
Neutrophils Relative %: 87 %
Platelet Count: 485 10*3/uL — ABNORMAL HIGH (ref 150–400)
RBC: 3.06 MIL/uL — ABNORMAL LOW (ref 4.22–5.81)
RDW: 15.9 % — ABNORMAL HIGH (ref 11.5–15.5)
WBC Count: 9.7 10*3/uL (ref 4.0–10.5)
nRBC: 0 % (ref 0.0–0.2)

## 2024-03-13 MED ORDER — SODIUM CHLORIDE 0.9 % IV SOLN: INTRAVENOUS | Status: DC

## 2024-03-13 MED ORDER — SODIUM CHLORIDE 0.9 % IV SOLN
200.0000 mg | Freq: Once | INTRAVENOUS | Status: AC
  Administered 2024-03-13: 200 mg via INTRAVENOUS
  Filled 2024-03-13: qty 200

## 2024-03-13 NOTE — Progress Notes (Signed)
 Pt. here first time Keytruda. Hemoglobin 7.0 and heart rate elevated 119. Pt. denies chest pain, but complains of dizziness at times and shortness of breath with moderate exertion. Pt also has 3 small areas of skin breakdown, stage 1 on his coccyx which his spouse states from lying in bed most of the day. No signs of infection noted. Pt. has right jugular vein which is bulging and lymph node swollen. Cassie Heilingoetter, PA in for assessment and observation. EKG completed and per Cassie Heilingoetter, ok to proceed with treatment today. Pt. scheduled to come in for blood tomorrow 03/14/24. Pt. also instructed to follow up with PCP for skin breakdown and dressing supplies give to pt. and spouse.

## 2024-03-13 NOTE — Patient Instructions (Addendum)
 [facility] - A DEPT OF [facility]  Discharge Instructions: Thank you for choosing [facility] to provide your oncology and hematology care.   If you have a lab appointment with the Cancer Center, please go directly to the Cancer Center and check in at the registration area.   Wear comfortable clothing and clothing appropriate for easy access to any Portacath or PICC line.   We strive to give you quality time with your provider. You may need to reschedule your appointment if you arrive late (15 or more minutes).  Arriving late affects you and other patients whose appointments are after yours.  Also, if you miss three or more appointments without notifying the office, you may be dismissed from the clinic at the provider's discretion.      For prescription refill requests, have your pharmacy contact our office and allow 72 hours for refills to be completed.    Today you received the following chemotherapy and/or immunotherapy agent: Pembrolizumab (Keytruda)      To help prevent nausea and vomiting after your treatment, we encourage you to take your nausea medication as directed.  BELOW ARE SYMPTOMS THAT SHOULD BE REPORTED IMMEDIATELY: *FEVER GREATER THAN 100.4 F (38 C) OR HIGHER *CHILLS OR SWEATING *NAUSEA AND VOMITING THAT IS NOT CONTROLLED WITH YOUR NAUSEA MEDICATION *UNUSUAL SHORTNESS OF BREATH *UNUSUAL BRUISING OR BLEEDING *URINARY PROBLEMS (pain or burning when urinating, or frequent urination) *BOWEL PROBLEMS (unusual diarrhea, constipation, pain near the anus) TENDERNESS IN MOUTH AND THROAT WITH OR WITHOUT PRESENCE OF ULCERS (sore throat, sores in mouth, or a toothache) UNUSUAL RASH, SWELLING OR PAIN  UNUSUAL VAGINAL DISCHARGE OR ITCHING   Items with * indicate a potential emergency and should be followed up as soon as possible or go to the Emergency Department if any problems should occur.  Please show the CHEMOTHERAPY ALERT CARD or  IMMUNOTHERAPY ALERT CARD at check-in to the Emergency Department and triage nurse.  Should you have questions after your visit or need to cancel or reschedule your appointment, please contact [facility]  Dept: [phone]  and follow the prompts.  Office hours are 8:00 a.m. to 4:30 p.m. Monday - Friday. Please note that voicemails left after 4:00 p.m. may not be returned until the following business day.  We are closed weekends and major holidays. You have access to a nurse at all times for urgent questions. Please call the main number to the clinic Dept: [phone] and follow the prompts.   For any non-urgent questions, you may also contact your provider using MyChart. We now offer e-Visits for anyone 3 and older to request care online for non-urgent symptoms. For details visit [email or web address].   Also download the MyChart app! Go to the app store, search "MyChart", open the app, select [facility], and log in with your MyChart username and password. Pembrolizumab Injection What is this medication? PEMBROLIZUMAB (PEM broe LIZ ue mab) treats some types of cancer. It works by helping your immune system slow or stop the spread of cancer cells. It is a monoclonal antibody. This medicine may be used for other purposes; ask your health care provider or pharmacist if you have questions. COMMON BRAND NAME(S): Keytruda What should I tell my care team before I take this medication? They need to know if you have any of these conditions: Allogeneic stem cell transplant (uses someone else's stem cells)  Autoimmune diseases, such as Crohn disease, ulcerative colitis, lupus History of chest radiation Nervous system problems, such as Guillain-Barre syndrome, myasthenia gravis Organ transplant An unusual or allergic reaction to pembrolizumab, other medications, foods, dyes, or preservatives Pregnant or trying to get pregnant Breast-feeding How  should I use this medication? This medication is injected into a vein. It is given by your care team in a hospital or clinic setting. A special MedGuide will be given to you before each treatment. Be sure to read this information carefully each time. Talk to your care team about the use of this medication in children. While it may be prescribed for children as young as 6 months for selected conditions, precautions do apply. Overdosage: If you think you have taken too much of this medicine contact a poison control center or emergency room at once. NOTE: This medicine is only for you. Do not share this medicine with others. What if I miss a dose? Keep appointments for follow-up doses. It is important not to miss your dose. Call your care team if you are unable to keep an appointment. What may interact with this medication? Interactions have not been studied. This list may not describe all possible interactions. Give your health care provider a list of all the medicines, herbs, non-prescription drugs, or dietary supplements you use. Also tell them if you smoke, drink alcohol, or use illegal drugs. Some items may interact with your medicine. What should I watch for while using this medication? Your condition will be monitored carefully while you are receiving this medication. You may need blood work while taking this medication. This medication may cause serious skin reactions. They can happen weeks to months after starting the medication. Contact your care team right away if you notice fevers or flu-like symptoms with a rash. The rash may be red or purple and then turn into blisters or peeling of the skin. You may also notice a red rash with swelling of the face, lips, or lymph nodes in your neck or under your arms. Tell your care team right away if you have any change in your eyesight. Talk to your care team if you may be pregnant. Serious birth defects can occur if you take this medication during  pregnancy and for 4 months after the last dose. You will need a negative pregnancy test before starting this medication. Contraception is recommended while taking this medication and for 4 months after the last dose. Your care team can help you find the option that works for you. Do not breastfeed while taking this medication and for 4 months after the last dose. What side effects may I notice from receiving this medication? Side effects that you should report to your care team as soon as possible: Allergic reactions--skin rash, itching, hives, swelling of the face, lips, tongue, or throat Dry cough, shortness of breath or trouble breathing Eye pain, redness, irritation, or discharge with blurry or decreased vision Heart muscle inflammation--unusual weakness or fatigue, shortness of breath, chest pain, fast or irregular heartbeat, dizziness, swelling of the ankles, feet, or hands Hormone gland problems--headache, sensitivity to light, unusual weakness or fatigue, dizziness, fast or irregular heartbeat, increased sensitivity to cold or heat, excessive sweating, constipation, hair loss, increased thirst or amount of urine, tremors or shaking, irritability Infusion reactions--chest pain, shortness of breath or trouble breathing, feeling faint or lightheaded Kidney injury (glomerulonephritis)--decrease in the amount of urine, red or dark brown urine, foamy or bubbly urine, swelling of the ankles, hands, or feet  Liver injury--right upper belly pain, loss of appetite, nausea, light-colored stool, dark yellow or brown urine, yellowing skin or eyes, unusual weakness or fatigue Pain, tingling, or numbness in the hands or feet, muscle weakness, change in vision, confusion or trouble speaking, loss of balance or coordination, trouble walking, seizures Rash, fever, and swollen lymph nodes Redness, blistering, peeling, or loosening of the skin, including inside the mouth Sudden or severe stomach pain, bloody  diarrhea, fever, nausea, vomiting Side effects that usually do not require medical attention (report to your care team if they continue or are bothersome): Bone, joint, or muscle pain Diarrhea Fatigue Loss of appetite Nausea Skin rash This list may not describe all possible side effects. Call your doctor for medical advice about side effects. You may report side effects to FDA at 1-800-FDA-1088. Where should I keep my medication? This medication is given in a hospital or clinic. It will not be stored at home. NOTE: This sheet is a summary. It may not cover all possible information. If you have questions about this medicine, talk to your doctor, pharmacist, or health care provider.  2024 [employer]/Gold Standard (2022-02-03 00:00:00)

## 2024-03-14 ENCOUNTER — Other Ambulatory Visit: Payer: Self-pay

## 2024-03-14 ENCOUNTER — Other Ambulatory Visit: Payer: Self-pay | Admitting: Physician Assistant

## 2024-03-14 ENCOUNTER — Inpatient Hospital Stay

## 2024-03-14 ENCOUNTER — Encounter: Payer: Self-pay | Admitting: Physician Assistant

## 2024-03-14 ENCOUNTER — Other Ambulatory Visit (HOSPITAL_COMMUNITY): Payer: Self-pay

## 2024-03-14 ENCOUNTER — Other Ambulatory Visit (HOSPITAL_BASED_OUTPATIENT_CLINIC_OR_DEPARTMENT_OTHER): Payer: Self-pay

## 2024-03-14 ENCOUNTER — Inpatient Hospital Stay (HOSPITAL_BASED_OUTPATIENT_CLINIC_OR_DEPARTMENT_OTHER): Admitting: Nurse Practitioner

## 2024-03-14 ENCOUNTER — Encounter: Payer: Self-pay | Admitting: Nurse Practitioner

## 2024-03-14 ENCOUNTER — Encounter: Payer: Self-pay | Admitting: Internal Medicine

## 2024-03-14 ENCOUNTER — Telehealth: Payer: Self-pay | Admitting: Medical Oncology

## 2024-03-14 VITALS — BP 113/69 | HR 115 | Temp 97.7°F | Resp 18 | Ht 67.0 in | Wt 117.2 lb

## 2024-03-14 DIAGNOSIS — C3411 Malignant neoplasm of upper lobe, right bronchus or lung: Secondary | ICD-10-CM

## 2024-03-14 DIAGNOSIS — Z515 Encounter for palliative care: Secondary | ICD-10-CM

## 2024-03-14 DIAGNOSIS — G893 Neoplasm related pain (acute) (chronic): Secondary | ICD-10-CM

## 2024-03-14 DIAGNOSIS — Z7189 Other specified counseling: Secondary | ICD-10-CM

## 2024-03-14 DIAGNOSIS — D649 Anemia, unspecified: Secondary | ICD-10-CM

## 2024-03-14 DIAGNOSIS — I2699 Other pulmonary embolism without acute cor pulmonale: Secondary | ICD-10-CM

## 2024-03-14 DIAGNOSIS — R059 Cough, unspecified: Secondary | ICD-10-CM

## 2024-03-14 DIAGNOSIS — R0989 Other specified symptoms and signs involving the circulatory and respiratory systems: Secondary | ICD-10-CM

## 2024-03-14 DIAGNOSIS — R000 Tachycardia, unspecified: Secondary | ICD-10-CM

## 2024-03-14 DIAGNOSIS — R630 Anorexia: Secondary | ICD-10-CM | POA: Diagnosis not present

## 2024-03-14 DIAGNOSIS — R530 Neoplastic (malignant) related fatigue: Secondary | ICD-10-CM

## 2024-03-14 LAB — PREPARE RBC (CROSSMATCH)

## 2024-03-14 LAB — CBC WITH DIFFERENTIAL (CANCER CENTER ONLY)
Abs Immature Granulocytes: 0.04 10*3/uL (ref 0.00–0.07)
Basophils Absolute: 0.1 10*3/uL (ref 0.0–0.1)
Basophils Relative: 1 %
Eosinophils Absolute: 0 10*3/uL (ref 0.0–0.5)
Eosinophils Relative: 0 %
HCT: 21.1 % — ABNORMAL LOW (ref 39.0–52.0)
Hemoglobin: 6.6 g/dL — CL (ref 13.0–17.0)
Immature Granulocytes: 1 %
Lymphocytes Relative: 4 %
Lymphs Abs: 0.3 10*3/uL — ABNORMAL LOW (ref 0.7–4.0)
MCH: 23.7 pg — ABNORMAL LOW (ref 26.0–34.0)
MCHC: 31.3 g/dL (ref 30.0–36.0)
MCV: 75.9 fL — ABNORMAL LOW (ref 80.0–100.0)
Monocytes Absolute: 0.8 10*3/uL (ref 0.1–1.0)
Monocytes Relative: 9 %
Neutro Abs: 7.5 10*3/uL (ref 1.7–7.7)
Neutrophils Relative %: 85 %
Platelet Count: 435 10*3/uL — ABNORMAL HIGH (ref 150–400)
RBC: 2.78 MIL/uL — ABNORMAL LOW (ref 4.22–5.81)
RDW: 15.9 % — ABNORMAL HIGH (ref 11.5–15.5)
WBC Count: 8.7 10*3/uL (ref 4.0–10.5)
nRBC: 0 % (ref 0.0–0.2)

## 2024-03-14 LAB — CMP (CANCER CENTER ONLY)
ALT: 18 U/L (ref 0–44)
AST: 21 U/L (ref 15–41)
Albumin: 3.1 g/dL — ABNORMAL LOW (ref 3.5–5.0)
Alkaline Phosphatase: 99 U/L (ref 38–126)
Anion gap: 9 (ref 5–15)
BUN: 19 mg/dL (ref 8–23)
CO2: 28 mmol/L (ref 22–32)
Calcium: 9 mg/dL (ref 8.9–10.3)
Chloride: 98 mmol/L (ref 98–111)
Creatinine: 0.74 mg/dL (ref 0.61–1.24)
GFR, Estimated: >60 mL/min (ref >60)
Glucose, Bld: 98 mg/dL (ref 70–99)
Potassium: 4.1 mmol/L (ref 3.5–5.1)
Sodium: 135 mmol/L (ref 135–145)
Total Bilirubin: 0.3 mg/dL (ref 0.0–1.2)
Total Protein: 7.1 g/dL (ref 6.5–8.1)

## 2024-03-14 LAB — TSH: TSH: 1.2 u[IU]/mL (ref 0.350–4.500)

## 2024-03-14 LAB — IRON AND IRON BINDING CAPACITY (CC-WL,HP ONLY)
Iron: 10 ug/dL — ABNORMAL LOW (ref 45–182)
Saturation Ratios: 4 % — ABNORMAL LOW (ref 17.9–39.5)
TIBC: 246 ug/dL — ABNORMAL LOW (ref 250–450)
UIBC: 236 ug/dL (ref 117–376)

## 2024-03-14 LAB — T4: T4, Total: 9.5 ug/dL (ref 4.5–12.0)

## 2024-03-14 LAB — SAMPLE TO BLOOD BANK

## 2024-03-14 LAB — FERRITIN: Ferritin: 932 ng/mL — ABNORMAL HIGH (ref 24–336)

## 2024-03-14 LAB — ABO/RH: ABO/RH(D): A POS

## 2024-03-14 MED ORDER — APIXABAN 5 MG PO TABS
5.0000 mg | ORAL_TABLET | Freq: Every day | ORAL | 7 refills | Status: DC
Start: 2024-02-15 — End: 2024-03-18
  Filled 2024-03-14 – 2024-03-15 (×3): qty 60, 30d supply, fill #0

## 2024-03-14 MED ORDER — BENZONATATE 200 MG PO CAPS
200.0000 mg | ORAL_CAPSULE | Freq: Three times a day (TID) | ORAL | 1 refills | Status: DC
  Filled 2024-03-14 (×2): qty 90, 30d supply, fill #0

## 2024-03-14 MED ORDER — OXYCODONE HCL 15 MG PO TABS
15.0000 mg | ORAL_TABLET | ORAL | 0 refills | Status: DC | PRN
  Filled 2024-03-14 – 2024-03-15 (×2): qty 90, 15d supply, fill #0

## 2024-03-14 MED ORDER — PANTOPRAZOLE SODIUM 40 MG PO TBEC
40.0000 mg | DELAYED_RELEASE_TABLET | Freq: Every day | ORAL | 3 refills | Status: DC
  Filled 2024-03-14 – 2024-03-15 (×2): qty 30, 30d supply, fill #0

## 2024-03-14 MED ORDER — ACETAMINOPHEN 325 MG PO TABS
650.0000 mg | ORAL_TABLET | Freq: Once | ORAL | Status: AC
  Administered 2024-03-14: 650 mg via ORAL
  Filled 2024-03-14: qty 2

## 2024-03-14 MED ORDER — SENNOSIDES-DOCUSATE SODIUM 8.6-50 MG PO TABS
2.0000 | ORAL_TABLET | Freq: Every day | ORAL | 0 refills | Status: DC
  Filled 2024-03-14 – 2024-03-24 (×2): qty 120, 60d supply, fill #0

## 2024-03-14 MED ORDER — DIPHENHYDRAMINE HCL 25 MG PO CAPS
25.0000 mg | ORAL_CAPSULE | Freq: Once | ORAL | Status: AC
  Administered 2024-03-14: 25 mg via ORAL
  Filled 2024-03-14: qty 1

## 2024-03-14 MED ORDER — FUROSEMIDE 10 MG/ML IJ SOLN: 20.0000 mg | Freq: Once | INTRAMUSCULAR | Status: DC

## 2024-03-14 MED ORDER — SODIUM CHLORIDE 0.9% IV SOLUTION: 250.0000 mL | INTRAVENOUS | Status: DC

## 2024-03-14 MED ORDER — OXYCODONE HCL ER 10 MG PO T12A
10.0000 mg | EXTENDED_RELEASE_TABLET | Freq: Two times a day (BID) | ORAL | 0 refills | Status: DC
  Filled 2024-03-14 – 2024-03-17 (×4): qty 14, 7d supply, fill #0

## 2024-03-14 NOTE — Progress Notes (Signed)
 Spoke with Jeff Romero in the Blood [facility] to confirm orders for 2 units of blood.

## 2024-03-14 NOTE — Telephone Encounter (Signed)
 CRITICAL VALUE STICKER  CRITICAL VALUE: hgb 6.6  RECEIVER (on-site recipient of call):Brandis Wixted, RN  DATE & TIME NOTIFIED: 03/14/2024 @ 1200  MESSENGER (representative from lab):Jessica  MD NOTIFIED: Heilingoetter,Cassie  TIME OF NOTIFICATION:03/14/2024  RESPONSE:  blood transfusion scheduled today for 2 units
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
## 2024-03-14 NOTE — Progress Notes (Signed)
 Late entry: I was called the the infusion room yesterday on 03/13/24 to evaluate a patient prior to his first keytruda infusion due to persistent tachycardia. Per chart review, the patient has had persistent tachycardia for several weeks and labs notable for microcytic anemia with Hbg of 7.0. EKG showed sinus tachycardia. RN noted right JVD. He has a complicated medical history with hospitalization in mid May for for bilateral PEs, no evidence of heart strain, EF 40-45%, small pericardial effusion, mild MR, no SVC syndrome on CTA, normal BNP at that time. Patient reports compliance with eliquis . Denies changes his his stable dyspnea on exertion. No facial swelling or upper extremity swelling. He does not have a cardiologist. Discussed case with supervising physician; cardiology referral recommended for further evaluation of JVD and cardiac status. Iron studies and repeat BNP ordered. 2 units of blood ordered for today but patient has anti-bodies which will cause delay in administration. Diuretics not initiated per supervising physician's recommendation. Patient requested oxycodone  and guaifenesin -codeine  refills; last filled by PCP on 03/01/24 (60 tablets) per PMP aware. Advised patient to continue controlled substance prescriptions with PCP for continuity and safety, as I am not familiar with his prior opioid use. Noted that patient has a palliative care appointment scheduled for today (03/14/24). I let patient know if he developed new or worsening symptoms I would have low threshold for ER evaluation.

## 2024-03-14 NOTE — Progress Notes (Signed)
 Patient here for first time blood transfusion. Was notified from blood bank that patients blod will not be ready today because he has antibodies. Patient and wife voiced understanding. Scheduled for 9:00am 03/15/24 patient aware.

## 2024-03-15 ENCOUNTER — Telehealth: Payer: Self-pay

## 2024-03-15 ENCOUNTER — Encounter: Payer: Self-pay | Admitting: Internal Medicine

## 2024-03-15 ENCOUNTER — Emergency Department (HOSPITAL_COMMUNITY)

## 2024-03-15 ENCOUNTER — Ambulatory Visit (HOSPITAL_BASED_OUTPATIENT_CLINIC_OR_DEPARTMENT_OTHER): Admitting: Physician Assistant

## 2024-03-15 ENCOUNTER — Inpatient Hospital Stay

## 2024-03-15 ENCOUNTER — Inpatient Hospital Stay (HOSPITAL_COMMUNITY)
Admission: EM | Admit: 2024-03-15 | Discharge: 2024-03-18 | DRG: 299 | Disposition: A | Discharge status: Home / Self Care | Source: Ambulatory Visit | Attending: Internal Medicine | Admitting: Internal Medicine

## 2024-03-15 ENCOUNTER — Other Ambulatory Visit: Payer: Self-pay

## 2024-03-15 ENCOUNTER — Other Ambulatory Visit (HOSPITAL_COMMUNITY): Payer: Self-pay

## 2024-03-15 ENCOUNTER — Encounter (HOSPITAL_COMMUNITY): Payer: Self-pay

## 2024-03-15 VITALS — BP 106/63 | HR 107 | Temp 98.3°F | Resp 21

## 2024-03-15 DIAGNOSIS — Z888 Allergy status to other drugs, medicaments and biological substances status: Secondary | ICD-10-CM

## 2024-03-15 DIAGNOSIS — J449 Chronic obstructive pulmonary disease, unspecified: Secondary | ICD-10-CM | POA: Diagnosis present

## 2024-03-15 DIAGNOSIS — E876 Hypokalemia: Secondary | ICD-10-CM | POA: Diagnosis present

## 2024-03-15 DIAGNOSIS — Z86718 Personal history of other venous thrombosis and embolism: Secondary | ICD-10-CM | POA: Diagnosis not present

## 2024-03-15 DIAGNOSIS — K219 Gastro-esophageal reflux disease without esophagitis: Secondary | ICD-10-CM | POA: Diagnosis present

## 2024-03-15 DIAGNOSIS — I8290 Acute embolism and thrombosis of unspecified vein: Secondary | ICD-10-CM | POA: Diagnosis present

## 2024-03-15 DIAGNOSIS — E785 Hyperlipidemia, unspecified: Secondary | ICD-10-CM | POA: Diagnosis present

## 2024-03-15 DIAGNOSIS — G893 Neoplasm related pain (acute) (chronic): Secondary | ICD-10-CM | POA: Diagnosis present

## 2024-03-15 DIAGNOSIS — Z8249 Family history of ischemic heart disease and other diseases of the circulatory system: Secondary | ICD-10-CM | POA: Diagnosis not present

## 2024-03-15 DIAGNOSIS — Z86711 Personal history of pulmonary embolism: Secondary | ICD-10-CM

## 2024-03-15 DIAGNOSIS — Z7901 Long term (current) use of anticoagulants: Secondary | ICD-10-CM | POA: Diagnosis not present

## 2024-03-15 DIAGNOSIS — Z832 Family history of diseases of the blood and blood-forming organs and certain disorders involving the immune mechanism: Secondary | ICD-10-CM | POA: Diagnosis not present

## 2024-03-15 DIAGNOSIS — C3491 Malignant neoplasm of unspecified part of right bronchus or lung: Principal | ICD-10-CM

## 2024-03-15 DIAGNOSIS — C3411 Malignant neoplasm of upper lobe, right bronchus or lung: Secondary | ICD-10-CM | POA: Diagnosis present

## 2024-03-15 DIAGNOSIS — Z681 Body mass index (BMI) 19 or less, adult: Secondary | ICD-10-CM

## 2024-03-15 DIAGNOSIS — Z515 Encounter for palliative care: Secondary | ICD-10-CM

## 2024-03-15 DIAGNOSIS — R000 Tachycardia, unspecified: Secondary | ICD-10-CM | POA: Diagnosis present

## 2024-03-15 DIAGNOSIS — I82890 Acute embolism and thrombosis of other specified veins: Secondary | ICD-10-CM | POA: Diagnosis present

## 2024-03-15 DIAGNOSIS — F1120 Opioid dependence, uncomplicated: Secondary | ICD-10-CM | POA: Diagnosis present

## 2024-03-15 DIAGNOSIS — R110 Nausea: Secondary | ICD-10-CM | POA: Diagnosis present

## 2024-03-15 DIAGNOSIS — D630 Anemia in neoplastic disease: Secondary | ICD-10-CM | POA: Diagnosis present

## 2024-03-15 DIAGNOSIS — C7951 Secondary malignant neoplasm of bone: Secondary | ICD-10-CM | POA: Diagnosis present

## 2024-03-15 DIAGNOSIS — L89322 Pressure ulcer of left buttock, stage 2: Secondary | ICD-10-CM | POA: Diagnosis present

## 2024-03-15 DIAGNOSIS — Z7189 Other specified counseling: Secondary | ICD-10-CM | POA: Diagnosis not present

## 2024-03-15 DIAGNOSIS — E8809 Other disorders of plasma-protein metabolism, not elsewhere classified: Secondary | ICD-10-CM | POA: Diagnosis present

## 2024-03-15 DIAGNOSIS — E860 Dehydration: Secondary | ICD-10-CM | POA: Diagnosis present

## 2024-03-15 DIAGNOSIS — R530 Neoplastic (malignant) related fatigue: Secondary | ICD-10-CM | POA: Diagnosis not present

## 2024-03-15 DIAGNOSIS — Z811 Family history of alcohol abuse and dependence: Secondary | ICD-10-CM

## 2024-03-15 DIAGNOSIS — J90 Pleural effusion, not elsewhere classified: Secondary | ICD-10-CM | POA: Diagnosis present

## 2024-03-15 DIAGNOSIS — E871 Hypo-osmolality and hyponatremia: Secondary | ICD-10-CM | POA: Diagnosis present

## 2024-03-15 DIAGNOSIS — D649 Anemia, unspecified: Secondary | ICD-10-CM | POA: Diagnosis not present

## 2024-03-15 DIAGNOSIS — R221 Localized swelling, mass and lump, neck: Secondary | ICD-10-CM | POA: Diagnosis not present

## 2024-03-15 DIAGNOSIS — I2699 Other pulmonary embolism without acute cor pulmonale: Secondary | ICD-10-CM | POA: Diagnosis present

## 2024-03-15 DIAGNOSIS — R052 Subacute cough: Secondary | ICD-10-CM | POA: Diagnosis not present

## 2024-03-15 DIAGNOSIS — Z87891 Personal history of nicotine dependence: Secondary | ICD-10-CM

## 2024-03-15 DIAGNOSIS — Z79899 Other long term (current) drug therapy: Secondary | ICD-10-CM

## 2024-03-15 DIAGNOSIS — R5381 Other malaise: Secondary | ICD-10-CM | POA: Diagnosis present

## 2024-03-15 DIAGNOSIS — E43 Unspecified severe protein-calorie malnutrition: Secondary | ICD-10-CM | POA: Diagnosis present

## 2024-03-15 DIAGNOSIS — J9601 Acute respiratory failure with hypoxia: Secondary | ICD-10-CM | POA: Diagnosis present

## 2024-03-15 DIAGNOSIS — C779 Secondary and unspecified malignant neoplasm of lymph node, unspecified: Secondary | ICD-10-CM | POA: Diagnosis present

## 2024-03-15 DIAGNOSIS — Z8546 Personal history of malignant neoplasm of prostate: Secondary | ICD-10-CM

## 2024-03-15 DIAGNOSIS — Z923 Personal history of irradiation: Secondary | ICD-10-CM

## 2024-03-15 DIAGNOSIS — R590 Localized enlarged lymph nodes: Secondary | ICD-10-CM | POA: Diagnosis present

## 2024-03-15 DIAGNOSIS — I898 Other specified noninfective disorders of lymphatic vessels and lymph nodes: Secondary | ICD-10-CM | POA: Diagnosis present

## 2024-03-15 DIAGNOSIS — K5900 Constipation, unspecified: Secondary | ICD-10-CM | POA: Diagnosis present

## 2024-03-15 LAB — CBC WITH DIFFERENTIAL/PLATELET
Abs Immature Granulocytes: 0.04 10*3/uL (ref 0.00–0.07)
Basophils Absolute: 0.1 10*3/uL (ref 0.0–0.1)
Basophils Relative: 1 %
Eosinophils Absolute: 0.1 10*3/uL (ref 0.0–0.5)
Eosinophils Relative: 1 %
HCT: 23.4 % — ABNORMAL LOW (ref 39.0–52.0)
Hemoglobin: 7.1 g/dL — ABNORMAL LOW (ref 13.0–17.0)
Immature Granulocytes: 0 %
Lymphocytes Relative: 5 %
Lymphs Abs: 0.4 10*3/uL — ABNORMAL LOW (ref 0.7–4.0)
MCH: 24.4 pg — ABNORMAL LOW (ref 26.0–34.0)
MCHC: 30.3 g/dL (ref 30.0–36.0)
MCV: 80.4 fL (ref 80.0–100.0)
Monocytes Absolute: 0.9 10*3/uL (ref 0.1–1.0)
Monocytes Relative: 10 %
Neutro Abs: 7.8 10*3/uL — ABNORMAL HIGH (ref 1.7–7.7)
Neutrophils Relative %: 83 %
Platelets: 396 10*3/uL (ref 150–400)
RBC: 2.91 MIL/uL — ABNORMAL LOW (ref 4.22–5.81)
RDW: 16.7 % — ABNORMAL HIGH (ref 11.5–15.5)
WBC: 9.3 10*3/uL (ref 4.0–10.5)
nRBC: 0 % (ref 0.0–0.2)

## 2024-03-15 LAB — BASIC METABOLIC PANEL WITH GFR
Anion gap: 10 (ref 5–15)
BUN: 16 mg/dL (ref 8–23)
CO2: 25 mmol/L (ref 22–32)
Calcium: 8.6 mg/dL — ABNORMAL LOW (ref 8.9–10.3)
Chloride: 98 mmol/L (ref 98–111)
Creatinine, Ser: 0.93 mg/dL (ref 0.61–1.24)
GFR, Estimated: >60 mL/min
Glucose, Bld: 122 mg/dL — ABNORMAL HIGH (ref 70–99)
Potassium: 2.9 mmol/L — ABNORMAL LOW (ref 3.5–5.1)
Sodium: 133 mmol/L — ABNORMAL LOW (ref 135–145)

## 2024-03-15 LAB — BRAIN NATRIURETIC PEPTIDE: B Natriuretic Peptide: 30.9 pg/mL (ref 0.0–100.0)

## 2024-03-15 LAB — MAGNESIUM: Magnesium: 1.7 mg/dL (ref 1.7–2.4)

## 2024-03-15 MED ORDER — MORPHINE SULFATE (PF) 4 MG/ML IV SOLN
4.0000 mg | Freq: Once | INTRAVENOUS | Status: AC
  Administered 2024-03-15: 4 mg via INTRAVENOUS
  Filled 2024-03-15: qty 1

## 2024-03-15 MED ORDER — HYDROMORPHONE HCL 1 MG/ML IJ SOLN
1.0000 mg | Freq: Once | INTRAMUSCULAR | Status: AC
  Administered 2024-03-15: 1 mg via INTRAVENOUS
  Filled 2024-03-15: qty 1

## 2024-03-15 MED ORDER — SODIUM CHLORIDE 0.9 % IV SOLN: INTRAVENOUS | Status: AC

## 2024-03-15 MED ORDER — BENZONATATE 100 MG PO CAPS
200.0000 mg | ORAL_CAPSULE | Freq: Three times a day (TID) | ORAL | Status: DC
  Administered 2024-03-15 – 2024-03-18 (×8): 200 mg via ORAL
  Filled 2024-03-15 (×8): qty 2

## 2024-03-15 MED ORDER — ACETAMINOPHEN 325 MG PO TABS
650.0000 mg | ORAL_TABLET | Freq: Once | ORAL | Status: AC
  Administered 2024-03-15: 650 mg via ORAL
  Filled 2024-03-15: qty 2

## 2024-03-15 MED ORDER — DIPHENHYDRAMINE HCL 25 MG PO CAPS
25.0000 mg | ORAL_CAPSULE | Freq: Once | ORAL | Status: AC
  Administered 2024-03-15: 25 mg via ORAL
  Filled 2024-03-15: qty 1

## 2024-03-15 MED ORDER — OXYCODONE HCL ER 10 MG PO T12A: 10.0000 mg | EXTENDED_RELEASE_TABLET | Freq: Two times a day (BID) | ORAL | Status: DC

## 2024-03-15 MED ORDER — ENSURE PLUS HIGH PROTEIN PO LIQD
237.0000 mL | Freq: Two times a day (BID) | ORAL | Status: DC
  Administered 2024-03-16: 237 mL via ORAL

## 2024-03-15 MED ORDER — GUAIFENESIN-CODEINE 100-10 MG/5ML PO SOLN
10.0000 mL | Freq: Three times a day (TID) | ORAL | Status: DC | PRN
  Administered 2024-03-16 – 2024-03-17 (×4): 10 mL via ORAL
  Filled 2024-03-15 (×4): qty 10

## 2024-03-15 MED ORDER — SUCRALFATE 1 G PO TABS
1.0000 g | ORAL_TABLET | Freq: Three times a day (TID) | ORAL | Status: DC
Start: 2024-03-15 — End: 2024-03-18
  Administered 2024-03-15 – 2024-03-18 (×11): 1 g via ORAL
  Filled 2024-03-15 (×11): qty 1

## 2024-03-15 MED ORDER — GUAIFENESIN-CODEINE 100-10 MG/5ML PO SOLN
5.0000 mL | Freq: Once | ORAL | Status: AC
  Administered 2024-03-15: 5 mL via ORAL
  Filled 2024-03-15: qty 5

## 2024-03-15 MED ORDER — POTASSIUM CHLORIDE 10 MEQ/100ML IV SOLN
10.0000 meq | INTRAVENOUS | Status: AC
  Administered 2024-03-15 (×2): 10 meq via INTRAVENOUS
  Filled 2024-03-15 (×2): qty 100

## 2024-03-15 MED ORDER — FENOFIBRATE 160 MG PO TABS
160.0000 mg | ORAL_TABLET | Freq: Every day | ORAL | Status: DC
  Administered 2024-03-16 – 2024-03-18 (×3): 160 mg via ORAL
  Filled 2024-03-15 (×3): qty 1

## 2024-03-15 MED ORDER — POTASSIUM CHLORIDE CRYS ER 20 MEQ PO TBCR
40.0000 meq | EXTENDED_RELEASE_TABLET | Freq: Once | ORAL | Status: AC
  Administered 2024-03-15: 40 meq via ORAL
  Filled 2024-03-15: qty 2

## 2024-03-15 MED ORDER — SENNOSIDES-DOCUSATE SODIUM 8.6-50 MG PO TABS
2.0000 | ORAL_TABLET | Freq: Every day | ORAL | Status: DC
  Administered 2024-03-15: 2 via ORAL
  Filled 2024-03-15: qty 2

## 2024-03-15 MED ORDER — SENNOSIDES-DOCUSATE SODIUM 8.6-50 MG PO TABS: 1.0000 | ORAL_TABLET | Freq: Every evening | ORAL | Status: DC | PRN

## 2024-03-15 MED ORDER — PANTOPRAZOLE SODIUM 40 MG PO TBEC
40.0000 mg | DELAYED_RELEASE_TABLET | Freq: Every day | ORAL | Status: DC
  Administered 2024-03-16 – 2024-03-18 (×3): 40 mg via ORAL
  Filled 2024-03-15 (×3): qty 1

## 2024-03-15 MED ORDER — MAGNESIUM SULFATE 2 GM/50ML IV SOLN
2.0000 g | Freq: Once | INTRAVENOUS | Status: AC
  Administered 2024-03-15: 2 g via INTRAVENOUS
  Filled 2024-03-15: qty 50

## 2024-03-15 MED ORDER — HYDROMORPHONE HCL 1 MG/ML IJ SOLN
0.5000 mg | INTRAMUSCULAR | Status: DC | PRN
  Administered 2024-03-15 – 2024-03-16 (×6): 1 mg via INTRAVENOUS
  Filled 2024-03-15 (×6): qty 1

## 2024-03-15 MED ORDER — IPRATROPIUM-ALBUTEROL 0.5-2.5 (3) MG/3ML IN SOLN
3.0000 mL | RESPIRATORY_TRACT | Status: DC | PRN
  Administered 2024-03-17: 3 mL via RESPIRATORY_TRACT
  Filled 2024-03-15: qty 3

## 2024-03-15 MED ORDER — POLYETHYLENE GLYCOL 3350 17 G PO PACK
17.0000 g | PACK | Freq: Every day | ORAL | Status: DC
  Administered 2024-03-16 – 2024-03-18 (×3): 17 g via ORAL
  Filled 2024-03-15 (×3): qty 1

## 2024-03-15 MED ORDER — HEPARIN (PORCINE) 25000 UT/250ML-% IV SOLN
1400.0000 [IU]/h | INTRAVENOUS | Status: DC
  Administered 2024-03-15: 1100 [IU]/h via INTRAVENOUS
  Administered 2024-03-16: 1250 [IU]/h via INTRAVENOUS
  Filled 2024-03-15 (×3): qty 250

## 2024-03-15 MED ORDER — ROSUVASTATIN CALCIUM 10 MG PO TABS
10.0000 mg | ORAL_TABLET | Freq: Every day | ORAL | Status: DC
  Administered 2024-03-16 – 2024-03-17 (×2): 10 mg via ORAL
  Filled 2024-03-15 (×3): qty 1

## 2024-03-15 MED ORDER — ACETAMINOPHEN 500 MG PO TABS
1000.0000 mg | ORAL_TABLET | Freq: Three times a day (TID) | ORAL | Status: DC
  Administered 2024-03-15 – 2024-03-18 (×8): 1000 mg via ORAL
  Filled 2024-03-15 (×8): qty 2

## 2024-03-15 MED ORDER — OXYCODONE HCL 5 MG PO TABS
15.0000 mg | ORAL_TABLET | ORAL | Status: DC | PRN
  Administered 2024-03-15 – 2024-03-18 (×7): 15 mg via ORAL
  Filled 2024-03-15 (×7): qty 3

## 2024-03-15 MED ORDER — ONDANSETRON HCL 4 MG/2ML IJ SOLN
4.0000 mg | Freq: Once | INTRAMUSCULAR | Status: AC
  Administered 2024-03-15: 4 mg via INTRAVENOUS
  Filled 2024-03-15: qty 2

## 2024-03-15 MED ORDER — SODIUM CHLORIDE 0.9% IV SOLUTION
250.0000 mL | INTRAVENOUS | Status: DC
  Administered 2024-03-15: 250 mL via INTRAVENOUS

## 2024-03-15 MED ORDER — MEGESTROL ACETATE 40 MG PO TABS: 40.0000 mg | ORAL_TABLET | Freq: Three times a day (TID) | ORAL | Status: DC

## 2024-03-15 MED ORDER — ONDANSETRON HCL 4 MG/2ML IJ SOLN
4.0000 mg | Freq: Four times a day (QID) | INTRAMUSCULAR | Status: DC | PRN
  Administered 2024-03-17: 4 mg via INTRAVENOUS
  Filled 2024-03-15: qty 2

## 2024-03-15 MED ORDER — ONDANSETRON HCL 4 MG PO TABS: 4.0000 mg | ORAL_TABLET | Freq: Four times a day (QID) | ORAL | Status: DC | PRN

## 2024-03-15 MED ORDER — IOHEXOL 350 MG/ML SOLN
75.0000 mL | Freq: Once | INTRAVENOUS | Status: AC | PRN
  Administered 2024-03-15: 75 mL via INTRAVENOUS

## 2024-03-15 NOTE — Telephone Encounter (Signed)
 Spoke with pt wife regarding need for PA, communicated with insurance company, awaiting fax.

## 2024-03-15 NOTE — H&P (Signed)
 History and Physical    Patient: Jeff Romero WUJ:[id number] DOB: 01-13-1961 DOA: 03/15/2024 DOS: the patient was seen and examined on 03/15/2024 PCP: [facility]  Patient coming from: Oncology office   Chief Complaint:  Chief Complaint  Patient presents with   neck swelling   HPI: Jeff Romero is a 63 y.o. 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 He is currently getting Magnolia Schroeder (first dose 6/4) presented to the ED for evaluation of acutely worsening neck pain/swelling. Reports associated wheezing and cough, worse with lying down.  Denies dysphagia.  In the ED he was in sinus tachycardia but normotensive, saturating mid 80's on RA.   CT with right external jugular thrombosis and necrotic lymph nodes, which were seen on recent PET. ED provider discussed with on-call Oncology who recommended admission to hospitalist service on heparin  gtt, further recommendations to follow.   Review of Systems: Review of Systems  Constitutional:  Positive for malaise/fatigue and weight loss. Negative for chills, diaphoresis and fever.  HENT:  Negative for sinus pain.   Respiratory:  Positive for cough, shortness of breath and wheezing. Negative for hemoptysis and sputum production.   Cardiovascular:  Negative for chest pain, palpitations and leg swelling.  Gastrointestinal:  Negative for abdominal pain, blood in stool, constipation, diarrhea, nausea and vomiting.  Genitourinary:  Negative for frequency and urgency.  Musculoskeletal:  Positive for neck pain. Negative for back pain and joint pain.  Skin:  Negative for itching and rash.  Neurological:  Positive for dizziness. Negative for tremors, sensory change, speech change, focal weakness, seizures, loss of consciousness and weakness.  Endo/Heme/Allergies:  Bruises/bleeds easily.  Psychiatric/Behavioral:  Negative for suicidal ideas.  The patient is nervous/anxious.     Past Medical History:  Diagnosis Date   Allergy    Anxiety    Arthritis    Blood transfusion without reported diagnosis    Cataract    Depression    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   Prostate cancer [facility])    Past Surgical History:  Procedure Laterality Date   BRONCHIAL BIOPSY  01/21/2024   Procedure: BRONCHOSCOPY, WITH BIOPSY;  Surgeon: Joesph Mussel, DO;  Location: [facility] ENDOSCOPY;  Service: Cardiopulmonary;;   BRONCHIAL BRUSHINGS  01/21/2024   Procedure: BRONCHOSCOPY, WITH BRUSH BIOPSY;  Surgeon: Joesph Mussel, DO;  Location: [facility] ENDOSCOPY;  Service: Cardiopulmonary;;   FLEXIBLE BRONCHOSCOPY N/A 01/21/2024   Procedure: Cordella Deter;  Surgeon: Joesph Mussel, DO;  Location: [facility] ENDOSCOPY;  Service: Cardiopulmonary;  Laterality: N/A;   INGUINAL HERNIA REPAIR Right 09/18/2021   Procedure: OPEN RIGHT INGUINAL HERNIA REPAIR WITH MESH;  Surgeon: Kinsinger, Alphonso Aschoff, MD;  Location: [facility] ORS;  Service: General;  Laterality: Right;   KNEE CARTILAGE SURGERY Right    PROSTATE SURGERY     ROTATOR CUFF REPAIR Right    Social History:  reports that he quit smoking about 9 years ago. His smoking use included e-cigarettes. He has never used smokeless tobacco. He reports current alcohol use of about 6.0 standard drinks of alcohol per week. He reports that he does not use drugs.  Allergies  Allergen Reactions   Breyna  [Budesonide -Formoterol  Fumarate] Cough    Worsened existing cough    Family History  Problem Relation Age of Onset   Sickle cell anemia Mother    Cirrhosis Mother    Alcohol abuse Mother  Bone cancer Father    Hypertension Sister    Hypertension Brother    Colon cancer Neg Hx    Esophageal cancer Neg Hx    Rectal cancer Neg Hx    Stomach cancer Neg Hx     Prior to Admission medications   Medication Sig Start Date End Date Taking? Authorizing Provider  acetaminophen  (TYLENOL ) 500 MG tablet Take  2 tablets (1,000 mg total) by mouth every 8 (eight) hours. 02/15/24   Ghimire, Estil Heman, MD  albuterol  (VENTOLIN  HFA) 108 (90 Base) MCG/ACT inhaler Inhale 1 puff into the lungs every 6 (six) hours as needed for wheezing or shortness of breath. 12/23/23   [provider]  apixaban  (ELIQUIS ) 5 MG TABS tablet Take 2 tablets (10 mg total) by mouth twice daily through 05/18 , on 05/19 switch to take 1 tablet (5 mg total) by mouth twice a day 02/15/24   Burton Casey, MD  benzonatate  (TESSALON ) 200 MG capsule Take 1 capsule (200 mg total) by mouth 3 (three) times daily. 03/14/24   Pickenpack-Cousar, Athena N, NP  desipramine (NORPRAMIN) 25 MG tablet Take 25 mg by mouth daily.    [provider]  fenofibrate 160 MG tablet Take 160 mg by mouth daily.    [provider]  guaiFENesin -codeine  100-10 MG/5ML syrup Take 10 mLs by mouth 3 (three) times daily as needed for cough. 02/15/24   Ghimire, Estil Heman, MD  ondansetron  (ZOFRAN ) 4 MG tablet Take 1 tablet (4 mg total) by mouth every 6 (six) hours as needed for nausea. 01/22/24   Sheikh, Jonel Nephew Latif, DO  OVER THE COUNTER MEDICATION Unknown eye drop and directions    [provider]  oxyCODONE  (OXYCONTIN ) 10 mg 12 hr tablet Take 1 tablet (10 mg total) by mouth every 12 (twelve) hours. 03/14/24   Pickenpack-Cousar, Giles Labrum, NP  oxyCODONE  (ROXICODONE ) 15 MG immediate release tablet Take 1 tablet (15 mg total) by mouth every 4 (four) hours as needed for severe pain (pain score 7-10). 03/14/24   Pickenpack-Cousar, Giles Labrum, NP  pantoprazole  (PROTONIX ) 40 MG tablet Take 1 tablet (40 mg total) by mouth daily. 03/14/24   Pickenpack-Cousar, Giles Labrum, NP  polyethylene glycol powder (GLYCOLAX /MIRALAX ) 17 GM/SCOOP powder Take 17 g by mouth daily. 02/15/24   Ghimire, Estil Heman, MD  rosuvastatin  (CRESTOR ) 10 MG tablet Take 10 mg by mouth daily. 12/23/23   [provider]  senna-docusate (SENOKOT-S) 8.6-50 MG tablet Take 2 tablets by  mouth at bedtime. 02/15/24   Ghimire, Estil Heman, MD  sucralfate  (CARAFATE ) 1 g tablet Take 1 tablet (1 g total) by mouth 4 (four) times daily -  with meals and at bedtime. 03/01/24   Marlene Simas, MD    Physical Exam: Vitals:   03/15/24 1150 03/15/24 1524 03/15/24 1600 03/15/24 1700  BP: 112/76 (!) 144/86 130/67 128/74  Pulse: (!) 102 (!) 124 (!) 126 (!) 127  Resp: 20 (!) 21 (!) 22 17  Temp: 98.2 F (36.8 C) 98.1 F (36.7 C)    TempSrc: Oral Oral    SpO2: 99% 100% 97% 100%  Physical Exam Vitals and nursing note reviewed.  Constitutional:      Appearance: He is ill-appearing (chronically).  HENT:     Mouth/Throat:     Mouth: Mucous membranes are moist.     Comments: Tender, Nodular cervical and tonsilar lymphadenopathy, mild right facial swelling  Eyes:     General: No scleral icterus.    Extraocular Movements: Extraocular movements intact.  Pupils: Pupils are equal, round, and reactive to light.  Cardiovascular:     Rate and Rhythm: Regular rhythm. Tachycardia present.  Pulmonary:     Effort: Pulmonary effort is normal.     Breath sounds: Wheezing and rhonchi present. No rales.  Chest:     Chest wall: No tenderness.  Abdominal:     General: There is no distension.     Palpations: Abdomen is soft.     Tenderness: There is no abdominal tenderness.  Musculoskeletal:     Cervical back: Neck supple.     Right lower leg: No edema.     Left lower leg: No edema.  Lymphadenopathy:     Cervical: Cervical adenopathy present.  Skin:    General: Skin is warm and dry.     Capillary Refill: Capillary refill takes less than 2 seconds.  Neurological:     General: No focal deficit present.     Mental Status: He is alert. Mental status is at baseline.  Psychiatric:        Mood and Affect: Mood normal.        Behavior: Behavior normal.     Data Reviewed:    Labs on Admission: I have personally reviewed following labs and imaging studies  CBC: Recent Labs  Lab  03/13/24 1459 03/14/24 1126 03/15/24 1234  WBC 9.7 8.7 9.3  NEUTROABS 8.5* 7.5 7.8*  HGB 7.0* 6.6* 7.1*  HCT 23.3* 21.1* 23.4*  MCV 76.1* 75.9* 80.4  PLT 485* 435* 396   Basic Metabolic Panel: Recent Labs  Lab 03/13/24 1459 03/14/24 1126 03/15/24 1234  NA 135 135 133*  K 3.6 4.1 2.9*  CL 98 98 98  CO2 26 28 25   GLUCOSE 137* 98 122*  BUN 13 19 16   CREATININE 0.71 0.74 0.93  CALCIUM  9.3 9.0 8.6*  MG  --   --  1.7   GFR: Estimated Creatinine Clearance: 62 mL/min (by C-G formula based on SCr of 0.93 mg/dL). Liver Function Tests: Recent Labs  Lab 03/13/24 1459 03/14/24 1126  AST 16 21  ALT 18 18  ALKPHOS 108 99  BILITOT 0.3 0.3  PROT 7.5 7.1  ALBUMIN 3.3* 3.1*   No results for input(s): LIPASE, AMYLASE in the last 168 hours. No results for input(s): AMMONIA in the last 168 hours. Coagulation Profile: No results for input(s): INR, PROTIME in the last 168 hours. Cardiac Enzymes: No results for input(s): CKTOTAL, CKMB, CKMBINDEX, TROPONINI in the last 168 hours. BNP (last 3 results) No results for input(s): PROBNP in the last 8760 hours. HbA1C: No results for input(s): HGBA1C in the last 72 hours. CBG: No results for input(s): GLUCAP in the last 168 hours. Lipid Profile: No results for input(s): CHOL, HDL, LDLCALC, TRIG, CHOLHDL, LDLDIRECT in the last 72 hours. Thyroid Function Tests: Recent Labs    03/13/24 1459  TSH 1.200  T4TOTAL 9.5   Anemia Panel: Recent Labs    03/14/24 1126  FERRITIN 932*  TIBC 246*  IRON 10*   Urine analysis:    Component Value Date/Time   COLORURINE BROWN (A) 07/12/2016 1833   APPEARANCEUR CLOUDY (A) 07/12/2016 1833   LABSPEC 1.010 07/12/2016 1833   PHURINE >9.0 (H) 07/12/2016 1833   GLUCOSEU 100 (A) 07/12/2016 1833   HGBUR TRACE (A) 07/12/2016 1833   BILIRUBINUR LARGE (A) 07/12/2016 1833   KETONESUR 15 (A) 07/12/2016 1833   PROTEINUR 100 (A) 07/12/2016 1833   NITRITE POSITIVE (A)  07/12/2016 1833   LEUKOCYTESUR MODERATE (A)  07/12/2016 1833    Radiological Exams on Admission: CT Soft Tissue Neck W Contrast Addendum Date: 03/15/2024 ADDENDUM REPORT: 03/15/2024 15:04 ADDENDUM: These results were called by telephone at the time of interpretation on 03/15/2024 at 3:04 pm to provider JULIE HAVILAND , who verbally acknowledged these results. Electronically Signed   By: Denny Flack M.D.   On: 03/15/2024 15:04   Result Date: 03/15/2024 CLINICAL DATA:  Neck mass, nonpulsatile.  Neck swelling. EXAM: CT NECK WITH CONTRAST TECHNIQUE: Multidetector CT imaging of the neck was performed using the standard protocol following the bolus administration of intravenous contrast. RADIATION DOSE REDUCTION: This exam was performed according to the departmental dose-optimization program which includes automated exposure control, adjustment of the mA and/or kV according to patient size and/or use of iterative reconstruction technique. CONTRAST:  75mL OMNIPAQUE  IOHEXOL  350 MG/ML SOLN COMPARISON:  Same day CTA chest and CTA chest dated 02/12/2024. PET CT 02/01/2024. FINDINGS: Pharynx and larynx: The nasopharynx is symmetric. Slight asymmetry of the left palatine tonsil. The oral cavity and floor of mouth are unremarkable. Normal appearance of the base of tongue. Normal appearance of the epiglottis. Aryepiglottic folds are symmetric. Vocal folds are symmetric. Salivary glands: No inflammation, mass, or stone. Thyroid: Normal. Lymph nodes: There is a lymph node conglomerate in right cervical level IV and involving the right supraclavicular fossa which measures 2.6 x 2.0 cm which appears similar to the prior PET CT. There are additional enlarged lymph nodes extending inferior to this nodal conglomerate which abuts the right brachiocephalic vein measuring up to 1.7 cm with central hypoattenuation concerning for central necrosis (series 2, image 104). Vascular: There is a filling defect within the right external  jugular vein noted extending superiorly to the level of the angle of the mandible (series 2, image 53). The filling defect extends inferiorly within the external jugular vein with eventual non opacification of the vessel. There is significant distension of the external jugular vein within the right supraclavicular fossa noted on series 2, image 85). There is associated edema and stranding within the right neck along the sternocleidomastoid muscle extending into the posterior cervical region and supraclavicular fossa. There is likely narrowing of the right brachiocephalic vein due to enlarged lower cervical/supraclavicular lymph nodes. Mild atherosclerosis of the carotid bifurcations without high-grade stenosis appreciated. Limited intracranial: Negative. Visualized orbits: Negative. Mastoids and visualized paranasal sinuses: Air-fluid level in the left maxillary sinus. Mastoid air cells are clear. Skeleton: No acute or aggressive finding. Degenerative changes in the visualized cervical spine. Upper chest: Thoracic findings better evaluated on same day CTA chest. There is redemonstration of right pleural effusion and necrotic right upper lobe mass as well as abnormal appearing superior mediastinal lymph nodes. Emphysema noted. Other: None. IMPRESSION: There is thrombosis of the right external jugular vein extending from the level of the angle of the mandible inferiorly into the supraclavicular fossa. The vessel is significantly distended within the supraclavicular fossa with surrounding edema and stranding. Centrally necrotic lymph nodes in cervical level IV/supraclavicular fossa corresponding to findings on recent PET CT. Centrally necrotic nodal conglomerate abuts the right brachiocephalic vein and likely results in narrowing of the vessel. Mild asymmetric prominence of the left palatine tonsil. Recommend correlation with direct visualization. Thoracic findings better evaluated on same day CTA chest. Air-fluid  level in the left maxillary sinus. Recommend correlation for acute sinusitis. Electronically Signed: By: Denny Flack M.D. On: 03/15/2024 14:56   CT Angio Chest PE W and/or Wo Contrast Result Date: 03/15/2024 CLINICAL DATA:  Neck  swelling and mass. Lung cancer. Concern for pulmonary embolism. EXAM: CT ANGIOGRAPHY CHEST WITH CONTRAST TECHNIQUE: Multidetector CT imaging of the chest was performed using the standard protocol during bolus administration of intravenous contrast. Multiplanar CT image reconstructions and MIPs were obtained to evaluate the vascular anatomy. RADIATION DOSE REDUCTION: This exam was performed according to the departmental dose-optimization program which includes automated exposure control, adjustment of the mA and/or kV according to patient size and/or use of iterative reconstruction technique. CONTRAST:  75mL OMNIPAQUE  IOHEXOL  350 MG/ML SOLN COMPARISON:  CT dated 02/12/2024. FINDINGS: Evaluation of this exam is limited due to respiratory motion as streak artifact caused by patient's arms. Evaluation is also limited due to streak artifact caused by dense contrast and overlying support wires. Cardiovascular: There is no cardiomegaly or pericardial effusion. Mild atherosclerotic calcification of the thoracic aorta. No aneurysmal dilatation or dissection. The the origins of the great vessels of the aortic arch appear patent. Evaluation of the pulmonary arteries is limited due to factors above. There are however small bilateral subsegmental pulmonary artery emboli which may be residual from prior PE. Recurrent acute PE is not excluded. Overall decrease in the clot burden since the prior CT. No evidence of right heart straining. Mediastinum/Nodes: Right hilar mass/adenopathy measures 3.8 x 3.7 cm inferiorly and 3.4 x 3.3 cm superiorly. Left hilar mass/adenopathy measures 2.5 x 2.5 cm, increased since the prior CT. The esophagus is grossly unremarkable no mediastinal fluid collection. Right neck  mass/adenopathy with overall interval increase in the size of the supraclavicular lymph nodes compared to prior CT. There is diffuse skin edema and soft tissue swelling of the right side of the neck, new since the prior CT. Lungs/Pleura: Right pleural effusion, increased in size since the prior CT. An area of consolidative change extending from the right hilum into the right upper lobe relatively similar. There is background of emphysema. There is a 13 mm left lower lobe nodule increased in size since the prior CT. No pneumothorax. The central airways are patent. Upper Abdomen: No acute abnormality. Musculoskeletal: No acute osseous pathology. Review of the MIP images confirms the above findings. IMPRESSION: 1. Small bilateral subsegmental pulmonary artery emboli, likely residual from prior PE. Recurrent acute PE is not excluded. Overall decrease in the clot burden since the prior CT. No evidence of right heart straining. 2. Right pleural effusion, increased in size since the prior CT. 3. Right neck mass/adenopathy with overall interval increase in the size of the supraclavicular lymph nodes compared to prior CT. There is diffuse skin edema and soft tissue swelling of the right neck, new since the prior CT. 4. Bilateral hilar masses/adenopathy, increased in size since the prior CT. 5. A 13 mm left lower lobe nodule, increased in size since the prior CT. 6. Aortic Atherosclerosis (ICD10-I70.0) and Emphysema (ICD10-J43.9). 03/15/2024 These results were called by telephone at the time of interpretation on 03/15/2024 at 2:29 pm to provider JULIE HAVILAND , who verbally acknowledged these results. Electronically Signed   By: Angus Bark M.D.   On: 03/15/2024 14:47       Assessment and Plan: No notes have been filed under this hospital service. Service: Hospitalist  63 y/o M with stage 4 lung cancer on keytruda, hx PE on eliquis , anemia, sent from Oncology office where he was receiving scheduled blood  transfusion for further evaluation of acute painful swelling in his neck. Found to have right EJ thrombosis and Centrally necrotic lymph nodes in cervical level IV/supraclavicular fossa corresponding to findings on  recent PET CT. Admitted on heparin  gtt, further management per Oncology.   #Right external jugular thrombosis  #Centrally necrotic lymph nodes in cervical level IV/supraclavicular #fossa  #Hx PE on eliquis   - heparin  gtt, pharmacy assistance appreciated  - hold eliuqis, ongoing a/c per Oncology  - pain control-- continue chronic pain regimen (PDMP reviewed)with dilaudid  for breakthrough pain  - additional management pending Onocology input, appreciate expertise.     #Stage IV (t2b, N3, M1 B) non-small cell lung cancer  #Hypoxia  #Hx PE - on Keytruda (first dose 6/4). Next appointment with oncologist is 04/03/24 - Oncology assistance appreciated  - cont megace  - PRN nebs, supportive care, as needed supplemental O2 to maintain saturations >90%   #anemia  S/p 1 unit PRBC in office earlier today. Hgb 7.1. Denies active bleeding.  - cont protonix /sucralfate . Recently completed therapy for hpylori  - monitor/transfuse as indicated.   HLD - continue home medications   Heparin  gtt Monitor/replace electrolytes  Heart healthy diet, NPO midnight NS@40cc   Patient desires to be full code, per discussion with him and his wife at the time of admission   Advance Care Planning:   Code Status: Full Code    Consults: Oncology, Pharmacy    Severity of Illness: The appropriate patient status for this patient is INPATIENT. Inpatient status is judged to be reasonable and necessary in order to provide the required intensity of service to ensure the patient's safety. The patient's presenting symptoms, physical exam findings, and initial radiographic and laboratory data in the context of their chronic comorbidities is felt to place them at high risk for further clinical deterioration.  Furthermore, it is not anticipated that the patient will be medically stable for discharge from the hospital within 2 midnights of admission.   * I certify that at the point of admission it is my clinical judgment that the patient will require inpatient hospital care spanning beyond 2 midnights from the point of admission due to high intensity of service, high risk for further deterioration and high frequency of surveillance required.*  Author: Charlesetta Connors, DO 03/15/2024 5:43 PM  For on call review [email or web address].

## 2024-03-15 NOTE — ED Triage Notes (Signed)
 Pt coming from the [facility] for neck swelling. PT was [facility] and began having neck swelling. Pt received one unit of blood. He received 650 of Tylenol  and 25 of benadryl. Had a recent PE on Eliquis .

## 2024-03-15 NOTE — ED Provider Notes (Signed)
 [place] EMERGENCY DEPARTMENT AT [facility] Provider Note   CSN: [id number] Arrival date & time: 03/15/24  1141     History  Chief Complaint  Patient presents with   neck swelling    Jeff Romero is a 63 y.o. male.  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  He is currently getting Keytruda (first dose 6/4).  His hgb was low, so the cancer center gave him a unit of blood today.  He noticed some right sided neck swelling and pain last night, so he was sent here for further eval.  He does have sob, which has been ongoing.  No throat pain or difficulty swallowing.            Home Medications Prior to Admission medications   Medication Sig Start Date End Date Taking? Authorizing Provider  acetaminophen  (TYLENOL ) 500 MG tablet Take 2 tablets (1,000 mg total) by mouth every 8 (eight) hours. 02/15/24   Ghimire, Estil Heman, MD  albuterol  (VENTOLIN  HFA) 108 (90 Base) MCG/ACT inhaler Inhale 1 puff into the lungs every 6 (six) hours as needed for wheezing or shortness of breath. 12/23/23   [provider]  apixaban  (ELIQUIS ) 5 MG TABS tablet Take 2 tablets (10 mg total) by mouth twice daily through 05/18 , on 05/19 switch to take 1 tablet (5 mg total) by mouth twice a day 02/15/24   Burton Casey, MD  benzonatate  (TESSALON ) 200 MG capsule Take 1 capsule (200 mg total) by mouth 3 (three) times daily. 03/14/24   Pickenpack-Cousar, Athena N, NP  desipramine (NORPRAMIN) 25 MG tablet Take 25 mg by mouth daily.    [provider]  fenofibrate 160 MG tablet Take 160 mg by mouth daily.    [provider]  guaiFENesin -codeine  100-10 MG/5ML syrup Take 10 mLs by mouth 3 (three) times daily as needed for cough. 02/15/24   Ghimire, Estil Heman, MD  ondansetron  (ZOFRAN ) 4 MG tablet Take 1 tablet (4 mg total) by mouth every 6 (six) hours as needed for nausea.  01/22/24   Sheikh, Jonel Nephew Latif, DO  OVER THE COUNTER MEDICATION Unknown eye drop and directions    [provider]  oxyCODONE  (OXYCONTIN ) 10 mg 12 hr tablet Take 1 tablet (10 mg total) by mouth every 12 (twelve) hours. 03/14/24   Pickenpack-Cousar, Giles Labrum, NP  oxyCODONE  (ROXICODONE ) 15 MG immediate release tablet Take 1 tablet (15 mg total) by mouth every 4 (four) hours as needed for severe pain (pain score 7-10). 03/14/24   Pickenpack-Cousar, Athena N, NP  pantoprazole  (PROTONIX ) 40 MG tablet Take 1 tablet (40 mg total) by mouth daily. 03/14/24   Pickenpack-Cousar, Giles Labrum, NP  polyethylene glycol powder (GLYCOLAX /MIRALAX ) 17 GM/SCOOP powder Take 17 g by mouth daily. 02/15/24   Ghimire, Estil Heman, MD  rosuvastatin  (CRESTOR ) 10 MG tablet Take 10 mg by mouth daily. 12/23/23   [provider]  senna-docusate (SENOKOT-S) 8.6-50 MG tablet Take 2 tablets by mouth at bedtime. 02/15/24   Ghimire, Estil Heman, MD  sucralfate  (CARAFATE ) 1 g tablet Take 1 tablet (1 g total) by mouth 4 (four) times daily -  with meals and at bedtime. 03/01/24   Marlene Simas, MD      Allergies    Patient has no known allergies.    Review of Systems   Review of Systems  Musculoskeletal:  Positive for neck pain.  All other systems reviewed and are negative.   Physical Exam Updated Vital Signs BP (!) 144/86   Pulse (!) 124   Temp 98.2 F (36.8 C) (Oral)   Resp (!) 21   SpO2 100%  Physical Exam Vitals and nursing note reviewed.  Constitutional:      Appearance: Normal appearance.  HENT:     Head: Atraumatic.     Comments: Right sided facial swelling    Nose: Nose normal.     Mouth/Throat:     Mouth: Mucous membranes are dry.  Eyes:     Extraocular Movements: Extraocular movements intact.     Conjunctiva/sclera: Conjunctivae normal.     Pupils: Pupils are equal, round, and reactive to light.  Neck:     Vascular: JVD present.     Comments: Right sided facial and neck swelling  JVD much more  pronounced on right Cardiovascular:     Rate and Rhythm: Tachycardia present.     Pulses: Normal pulses.     Heart sounds: Normal heart sounds.  Pulmonary:     Effort: Pulmonary effort is normal.     Breath sounds: Normal breath sounds.  Abdominal:     General: Abdomen is flat. Bowel sounds are normal.     Palpations: Abdomen is soft.  Musculoskeletal:        General: Normal range of motion.  Skin:    General: Skin is warm.     Capillary Refill: Capillary refill takes less than 2 seconds.  Neurological:     General: No focal deficit present.     Mental Status: He is alert and oriented to person, place, and time.  Psychiatric:        Mood and Affect: Mood normal.        Behavior: Behavior normal.     ED Results / Procedures / Treatments   Labs (all labs ordered are listed, but only abnormal results are displayed) Labs Reviewed  BASIC METABOLIC PANEL WITH GFR - Abnormal; Notable for the following components:      Result Value   Sodium 133 (*)    Potassium 2.9 (*)    Glucose, Bld 122 (*)    Calcium  8.6 (*)    All other components within normal limits  CBC WITH DIFFERENTIAL/PLATELET - Abnormal; Notable for the following components:   RBC 2.91 (*)    Hemoglobin 7.1 (*)    HCT 23.4 (*)    MCH 24.4 (*)    RDW 16.7 (*)    Neutro Abs 7.8 (*)    Lymphs Abs 0.4 (*)    All other components within normal limits  BRAIN NATRIURETIC PEPTIDE  MAGNESIUM     EKG EKG Interpretation Date/Time:  Wednesday March 15 2024 12:03:23 EDT Ventricular Rate:  106 PR Interval:  137 QRS Duration:  72 QT Interval:  328 QTC Calculation: 436 R Axis:   60  Text Interpretation: Sinus tachycardia No significant change since last tracing Confirmed by Sueellen Emery [phone]) on 03/15/2024 1:14:13 PM  Radiology CT Soft Tissue Neck W Contrast Addendum Date: 03/15/2024 ADDENDUM REPORT: 03/15/2024 15:04 ADDENDUM: These results were called by telephone at the time of interpretation on 03/15/2024 at 3:04  pm to provider Nazareth Norenberg , who verbally acknowledged these results. Electronically Signed   By: Denny Flack M.D.   On: 03/15/2024 15:04   Result Date: 03/15/2024 CLINICAL DATA:  Neck mass, nonpulsatile.  Neck swelling. EXAM: CT NECK WITH CONTRAST TECHNIQUE: Multidetector CT imaging of  the neck was performed using the standard protocol following the bolus administration of intravenous contrast. RADIATION DOSE REDUCTION: This exam was performed according to the departmental dose-optimization program which includes automated exposure control, adjustment of the mA and/or kV according to patient size and/or use of iterative reconstruction technique. CONTRAST:  75mL OMNIPAQUE  IOHEXOL  350 MG/ML SOLN COMPARISON:  Same day CTA chest and CTA chest dated 02/12/2024. PET CT 02/01/2024. FINDINGS: Pharynx and larynx: The nasopharynx is symmetric. Slight asymmetry of the left palatine tonsil. The oral cavity and floor of mouth are unremarkable. Normal appearance of the base of tongue. Normal appearance of the epiglottis. Aryepiglottic folds are symmetric. Vocal folds are symmetric. Salivary glands: No inflammation, mass, or stone. Thyroid: Normal. Lymph nodes: There is a lymph node conglomerate in right cervical level IV and involving the right supraclavicular fossa which measures 2.6 x 2.0 cm which appears similar to the prior PET CT. There are additional enlarged lymph nodes extending inferior to this nodal conglomerate which abuts the right brachiocephalic vein measuring up to 1.7 cm with central hypoattenuation concerning for central necrosis (series 2, image 104). Vascular: There is a filling defect within the right external jugular vein noted extending superiorly to the level of the angle of the mandible (series 2, image 53). The filling defect extends inferiorly within the external jugular vein with eventual non opacification of the vessel. There is significant distension of the external jugular vein within the  right supraclavicular fossa noted on series 2, image 85). There is associated edema and stranding within the right neck along the sternocleidomastoid muscle extending into the posterior cervical region and supraclavicular fossa. There is likely narrowing of the right brachiocephalic vein due to enlarged lower cervical/supraclavicular lymph nodes. Mild atherosclerosis of the carotid bifurcations without high-grade stenosis appreciated. Limited intracranial: Negative. Visualized orbits: Negative. Mastoids and visualized paranasal sinuses: Air-fluid level in the left maxillary sinus. Mastoid air cells are clear. Skeleton: No acute or aggressive finding. Degenerative changes in the visualized cervical spine. Upper chest: Thoracic findings better evaluated on same day CTA chest. There is redemonstration of right pleural effusion and necrotic right upper lobe mass as well as abnormal appearing superior mediastinal lymph nodes. Emphysema noted. Other: None. IMPRESSION: There is thrombosis of the right external jugular vein extending from the level of the angle of the mandible inferiorly into the supraclavicular fossa. The vessel is significantly distended within the supraclavicular fossa with surrounding edema and stranding. Centrally necrotic lymph nodes in cervical level IV/supraclavicular fossa corresponding to findings on recent PET CT. Centrally necrotic nodal conglomerate abuts the right brachiocephalic vein and likely results in narrowing of the vessel. Mild asymmetric prominence of the left palatine tonsil. Recommend correlation with direct visualization. Thoracic findings better evaluated on same day CTA chest. Air-fluid level in the left maxillary sinus. Recommend correlation for acute sinusitis. Electronically Signed: By: Denny Flack M.D. On: 03/15/2024 14:56   CT Angio Chest PE W and/or Wo Contrast Result Date: 03/15/2024 CLINICAL DATA:  Neck swelling and mass. Lung cancer. Concern for pulmonary embolism.  EXAM: CT ANGIOGRAPHY CHEST WITH CONTRAST TECHNIQUE: Multidetector CT imaging of the chest was performed using the standard protocol during bolus administration of intravenous contrast. Multiplanar CT image reconstructions and MIPs were obtained to evaluate the vascular anatomy. RADIATION DOSE REDUCTION: This exam was performed according to the departmental dose-optimization program which includes automated exposure control, adjustment of the mA and/or kV according to patient size and/or use of iterative reconstruction technique. CONTRAST:  75mL OMNIPAQUE  IOHEXOL  350  MG/ML SOLN COMPARISON:  CT dated 02/12/2024. FINDINGS: Evaluation of this exam is limited due to respiratory motion as streak artifact caused by patient's arms. Evaluation is also limited due to streak artifact caused by dense contrast and overlying support wires. Cardiovascular: There is no cardiomegaly or pericardial effusion. Mild atherosclerotic calcification of the thoracic aorta. No aneurysmal dilatation or dissection. The the origins of the great vessels of the aortic arch appear patent. Evaluation of the pulmonary arteries is limited due to factors above. There are however small bilateral subsegmental pulmonary artery emboli which may be residual from prior PE. Recurrent acute PE is not excluded. Overall decrease in the clot burden since the prior CT. No evidence of right heart straining. Mediastinum/Nodes: Right hilar mass/adenopathy measures 3.8 x 3.7 cm inferiorly and 3.4 x 3.3 cm superiorly. Left hilar mass/adenopathy measures 2.5 x 2.5 cm, increased since the prior CT. The esophagus is grossly unremarkable no mediastinal fluid collection. Right neck mass/adenopathy with overall interval increase in the size of the supraclavicular lymph nodes compared to prior CT. There is diffuse skin edema and soft tissue swelling of the right side of the neck, new since the prior CT. Lungs/Pleura: Right pleural effusion, increased in size since the prior  CT. An area of consolidative change extending from the right hilum into the right upper lobe relatively similar. There is background of emphysema. There is a 13 mm left lower lobe nodule increased in size since the prior CT. No pneumothorax. The central airways are patent. Upper Abdomen: No acute abnormality. Musculoskeletal: No acute osseous pathology. Review of the MIP images confirms the above findings. IMPRESSION: 1. Small bilateral subsegmental pulmonary artery emboli, likely residual from prior PE. Recurrent acute PE is not excluded. Overall decrease in the clot burden since the prior CT. No evidence of right heart straining. 2. Right pleural effusion, increased in size since the prior CT. 3. Right neck mass/adenopathy with overall interval increase in the size of the supraclavicular lymph nodes compared to prior CT. There is diffuse skin edema and soft tissue swelling of the right neck, new since the prior CT. 4. Bilateral hilar masses/adenopathy, increased in size since the prior CT. 5. A 13 mm left lower lobe nodule, increased in size since the prior CT. 6. Aortic Atherosclerosis (ICD10-I70.0) and Emphysema (ICD10-J43.9). 03/15/2024 These results were called by telephone at the time of interpretation on 03/15/2024 at 2:29 pm to provider Xenia Nile , who verbally acknowledged these results. Electronically Signed   By: Angus Bark M.D.   On: 03/15/2024 14:47    Procedures Procedures    Medications Ordered in ED Medications  guaiFENesin -codeine  100-10 MG/5ML solution 5 mL (has no administration in time range)  HYDROmorphone  (DILAUDID ) injection 1 mg (has no administration in time range)  magnesium  sulfate IVPB 2 g 50 mL (has no administration in time range)  morphine  (PF) 4 MG/ML injection 4 mg (4 mg Intravenous Given 03/15/24 1247)  ondansetron  (ZOFRAN ) injection 4 mg (4 mg Intravenous Given 03/15/24 1246)  potassium chloride  10 mEq in 100 mL IVPB (10 mEq Intravenous New Bag/Given 03/15/24  1413)  potassium chloride  SA (KLOR-CON  M) CR tablet 40 mEq (40 mEq Oral Given 03/15/24 1424)  iohexol  (OMNIPAQUE ) 350 MG/ML injection 75 mL (75 mLs Intravenous Contrast Given 03/15/24 1342)  morphine  (PF) 4 MG/ML injection 4 mg (4 mg Intravenous Given 03/15/24 1430)    ED Course/ Medical Decision Making/ A&P  Medical Decision Making Amount and/or Complexity of Data Reviewed Labs: ordered. Radiology: ordered.  Risk OTC drugs. Prescription drug management. Decision regarding hospitalization.   This patient presents to the ED for concern of neck swelling and pain, this involves an extensive number of treatment options, and is a complaint that carries with it a high risk of complications and morbidity.  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  The pertinent results include:  cbc with hgb 7.1; bmp with k low at 2.9; bnp nl; mg nl   Imaging Studies ordered:  I ordered imaging studies including ct chest/neck I independently visualized and interpreted imaging which showed  CT neck: There is thrombosis of the right external jugular vein extending  from the level of the angle of the mandible inferiorly into the  supraclavicular fossa. The vessel is significantly distended within  the supraclavicular fossa with surrounding edema and stranding.    Centrally necrotic lymph nodes in cervical level IV/supraclavicular    Centrally necrotic lymph nodes in cervical level IV/supraclavicular  fossa corresponding to findings on recent  PET CT. Centrally necrotic  nodal conglomerate abuts the right brachiocephalic vein and likely  results in narrowing of the vessel.        Mild asymmetric prominence of the left palatine tonsil. Recommend  correlation with direct visualization.    Thoracic findings better evaluated on same day CTA chest.    Air-fluid level in the left maxillary sinus. Recommend correlation  for acute sinusitis.   CT chest: 1. Small bilateral subsegmental pulmonary artery emboli, likely  residual from prior PE. Recurrent acute PE is not excluded. Overall  decrease in the clot burden since the prior CT. No evidence of right  heart straining.  2. Right pleural effusion, increased in size since the prior CT.  3. Right neck mass/adenopathy with overall interval increase in the  size of the supraclavicular lymph nodes compared to prior CT. There  is diffuse skin edema and soft tissue swelling of the right neck,  new since the prior CT.  4. Bilateral hilar masses/adenopathy, increased in size since the  prior CT.  5. A 13 mm left lower lobe nodule, increased in size since the prior  CT.  6. Aortic Atherosclerosis (ICD10-I70.0) and Emphysema (ICD10-J43.9).    03/15/2024   I agree with the radiologist interpretation   Cardiac Monitoring:  The patient was maintained on a cardiac monitor.  I personally viewed and interpreted the cardiac monitored which showed an underlying rhythm of: st   Medicines ordered and prescription drug management:  I ordered medication including morphine /zofran   for pain  Reevaluation of the patient after these medicines showed that the patient improved I have reviewed the patients home medicines and have made adjustments as needed   Test Considered:  ct   Critical Interventions:  ct   Consultations Obtained:  I requested consultation with the oncologist (Dr. Liam Redhead),  and discussed lab and imaging findings as well as pertinent plan -he recommends admission  to medicine.  Stop Eliquis  and start heparin .  Pt d/w Dr. Scherrie Curt ([facility]) for admission.  Problem List /  ED Course:  Hypokalemia:  iv and oral k given.  Likely from poor oral intake.  Mg on the low side of normal, so I have also added Mg. R external jugular vein thrombus:  pt on Eliquis .  Heparin  will be started.  Thrombus likely from cancer + pressure from compression on right brachiocephalic vein.   Cancer pain:  morphine  not touching pain.  Dilaudid  added on.   Reevaluation:  After the interventions noted above, I reevaluated the patient and found that they have :improved   Social Determinants of Health:  Lives at home   Dispostion:  After consideration of the diagnostic results and the patients response to treatment, I feel that the patent would benefit from admission.          Final Clinical Impression(s) / ED Diagnoses Final diagnoses:  Non-small cell cancer of right lung (HCC)  Lymphadenopathy, cervical  Cancer-related breakthrough pain  Hypokalemia  Acute external jugular vein thrombosis    Rx / DC Orders ED Discharge Orders     None         Sueellen Emery, MD 03/15/24 1536

## 2024-03-15 NOTE — Progress Notes (Signed)
 Symptom Management Consult Note Jeff [facility]    Patient Care Team: [facility] as PCP - General Bouchard, Lorilee Rooks, RN as Oncology Nurse Navigator Pickenpack-Cousar, Giles Labrum, NP as Nurse Practitioner [facility])    Name / MRN / DOB: Jeff Romero  [id number]  20-Jan-1961   Date of visit: 03/15/2024   Chief Complaint/Reason for visit: neck swelling   Current Therapy: Systemic therapy with immunotherapy with Keytruda 200 Mg IV every 3 weeks   Last treatment:  Day 1   Cycle 1 on 03/13/24    ASSESSMENT AND PLAN Patient is a 63 y.o. 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 Marguerita Shih.  I have viewed most recent oncology note and lab work.  #Stage IV (t2b, N3, M1 B) non-small cell lung cancer  - Next appointment with oncologist is 04/03/24  #Neck swelling - Acute. Airway intact. Clear lung sounds. PE with right sided face and neck swelling. - Patient tachycardic on arrival, admitted to feeling winded from walking to the infusion [facility]. Shortness of breath is baseline per patient. - Per chart review patient with new right JVD on 03/13/24 per oncologist with plan for outpatient cardiology referral.  - With new swelling patient needs emergent ED work up for further evaluation. Patient and spouse agreeable with plan. Report given to accepting MD.  #Anemia - Here for scheduled blood transfusion today. Pre medications administered were - Patient received one unit PRBC prior to ED transfer    HEME/ONC HISTORY Oncology History  Malignant neoplasm of upper lobe of right lung (HCC)  01/20/2024 Initial Diagnosis   Malignant neoplasm of upper lobe of right lung (HCC)   02/07/2024 Cancer Staging   Staging form: Lung, AJCC V9 - Clinical: Stage IVA (cT2b, cN3, cM1b) - Signed by Marlene Simas, MD on 02/07/2024 Method of lymph node assessment: Clinical   02/23/2024 - 02/23/2024 Chemotherapy    Patient is on Treatment Plan : LUNG Carboplatin (5) + Pemetrexed (500) + Pembrolizumab (200) D1 q21d Induction x 4 cycles / Maintenance Pemetrexed (500) + Pembrolizumab (200) D1 q21d     03/13/2024 -  Chemotherapy   Patient is on Treatment Plan : LUNG NSCLC Pembrolizumab (200) q21d         INTERVAL HISTORY  Discussed the use of AI scribe software for clinical note transcription with the patient, who gave verbal consent to proceed.    Jeff Romero is a 63 y.o. male with oncologic history as above presenting to [facility] today with chief complaint of neck swelling. He is accompanied by spouse who provides additional history.  Patient here for blood transfusion today.  He had lab work yesterday showing his hemoglobin was 6.6.  After the transfusion had started patient reported to nurse that he had new right sided neck swelling.  Spouse reports they noticed it last night and they believe it has increased in size.  Patient admits that it feels tender when touched.  He did have new JVD at a recent oncology visit and the plan was for outpatient cardiology referral.  Patient states he is compliant with his Eliquis .  He was diagnosed with PE last month.  He denies any difficulty breathing or swallowing.  He has shortness of breath at baseline and that is unchanged currently.  Denies fever or chest pain.       ROS  All other systems are reviewed and are negative for acute change  except as noted in the HPI.    No Known Allergies   Past Medical History:  Diagnosis Date  . Allergy   . Anxiety   . Arthritis   . Blood transfusion without reported diagnosis   . Cataract   . Depression   . 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  . Prostate cancer [facility])      Past Surgical History:  Procedure Laterality Date  . BRONCHIAL BIOPSY  01/21/2024   Procedure: BRONCHOSCOPY, WITH BIOPSY;  Surgeon: Joesph Mussel, DO;  Location: [facility] ENDOSCOPY;  Service: Cardiopulmonary;;  .  BRONCHIAL BRUSHINGS  01/21/2024   Procedure: BRONCHOSCOPY, WITH BRUSH BIOPSY;  Surgeon: Joesph Mussel, DO;  Location: [facility] ENDOSCOPY;  Service: Cardiopulmonary;;  . FLEXIBLE BRONCHOSCOPY N/A 01/21/2024   Procedure: Cordella Deter;  Surgeon: Joesph Mussel, DO;  Location: [facility] ENDOSCOPY;  Service: Cardiopulmonary;  Laterality: N/A;  . INGUINAL HERNIA REPAIR Right 09/18/2021   Procedure: OPEN RIGHT INGUINAL HERNIA REPAIR WITH MESH;  Surgeon: Kinsinger, Alphonso Aschoff, MD;  Location: [facility] ORS;  Service: General;  Laterality: Right;  . KNEE CARTILAGE SURGERY Right   . PROSTATE SURGERY    . ROTATOR CUFF REPAIR Right     Social History   Socioeconomic History  . Marital status: Married    Spouse name: Not on file  . Number of children: Not on file  . Years of education: Not on file  . Highest education level: Not on file  Occupational History  . Not on file  Tobacco Use  . Smoking status: Former    Types: E-cigarettes    Quit date: 05/09/2014    Years since quitting: 9.8  . Smokeless tobacco: Never  . Tobacco comments:    Quit 5 years ago   Vaping Use  . Vaping status: Former  . Quit date: 12/21/2023  Substance and Sexual Activity  . Alcohol use: Yes    Alcohol/week: 6.0 standard drinks of alcohol    Types: 6 Cans of beer per week    Comment: every other day  . Drug use: No  . Sexual activity: Not on file  Other Topics Concern  . Not on file  Social History Narrative  . Not on file   Social Drivers of Health   Financial Resource Strain: Not on file  Food Insecurity: No Food Insecurity (02/13/2024)   Hunger Vital Sign   . Worried About Programme researcher, broadcasting/film/video in the Last Year: Never true   . Ran Out of Food in the Last Year: Never true  Transportation Needs: No Transportation Needs (02/13/2024)   PRAPARE - Transportation   . Lack of Transportation (Medical): No   . Lack of Transportation (Non-Medical): No  Physical Activity: Not on file  Stress: Not on file  Social Connections:  Not on file  Intimate Partner Violence: Not At Risk (02/13/2024)   Humiliation, Afraid, Rape, and Kick questionnaire   . Fear of Current or Ex-Partner: No   . Emotionally Abused: No   . Physically Abused: No   . Sexually Abused: No    Family History  Problem Relation Age of Onset  . Sickle cell anemia Mother   . Cirrhosis Mother   . Alcohol abuse Mother   . Bone cancer Father   . Hypertension Sister   . Hypertension Brother   . Colon cancer Neg Hx   . Esophageal cancer Neg Hx   . Rectal cancer Neg Hx   . Stomach cancer Neg Hx  No current facility-administered medications for this visit.  Current Outpatient Medications:  .  acetaminophen  (TYLENOL ) 500 MG tablet, Take 2 tablets (1,000 mg total) by mouth every 8 (eight) hours., Disp: , Rfl:  .  albuterol  (VENTOLIN  HFA) 108 (90 Base) MCG/ACT inhaler, Inhale 1 puff into the lungs every 6 (six) hours as needed for wheezing or shortness of breath., Disp: , Rfl:  .  apixaban  (ELIQUIS ) 5 MG TABS tablet, Take 2 tablets (10 mg total) by mouth twice daily through 05/18 , on 05/19 switch to take 1 tablet (5 mg total) by mouth twice a day, Disp: 60 tablet, Rfl: 7 .  benzonatate  (TESSALON ) 200 MG capsule, Take 1 capsule (200 mg total) by mouth 3 (three) times daily., Disp: 90 capsule, Rfl: 1 .  desipramine (NORPRAMIN) 25 MG tablet, Take 25 mg by mouth daily., Disp: , Rfl:  .  fenofibrate 160 MG tablet, Take 160 mg by mouth daily., Disp: , Rfl:  .  guaiFENesin -codeine  100-10 MG/5ML syrup, Take 10 mLs by mouth 3 (three) times daily as needed for cough., Disp: 237 mL, Rfl: 0 .  ondansetron  (ZOFRAN ) 4 MG tablet, Take 1 tablet (4 mg total) by mouth every 6 (six) hours as needed for nausea., Disp: 20 tablet, Rfl: 0 .  OVER THE COUNTER MEDICATION, Unknown eye drop and directions, Disp: , Rfl:  .  oxyCODONE  (OXYCONTIN ) 10 mg 12 hr tablet, Take 1 tablet (10 mg total) by mouth every 12 (twelve) hours., Disp: 14 tablet, Rfl: 0 .  oxyCODONE  (ROXICODONE )  15 MG immediate release tablet, Take 1 tablet (15 mg total) by mouth every 4 (four) hours as needed for severe pain (pain score 7-10)., Disp: 90 tablet, Rfl: 0 .  pantoprazole  (PROTONIX ) 40 MG tablet, Take 1 tablet (40 mg total) by mouth daily., Disp: 30 tablet, Rfl: 3 .  polyethylene glycol powder (GLYCOLAX /MIRALAX ) 17 GM/SCOOP powder, Take 17 g by mouth daily., Disp: 476 g, Rfl: 1 .  rosuvastatin  (CRESTOR ) 10 MG tablet, Take 10 mg by mouth daily., Disp: , Rfl:  .  senna-docusate (SENOKOT-S) 8.6-50 MG tablet, Take 2 tablets by mouth at bedtime., Disp: 120 tablet, Rfl: 0 .  sucralfate  (CARAFATE ) 1 g tablet, Take 1 tablet (1 g total) by mouth 4 (four) times daily -  with meals and at bedtime., Disp: 120 tablet, Rfl: 1  Facility-Administered Medications Ordered in Other Visits:  .  morphine  (PF) 4 MG/ML injection 4 mg, 4 mg, Intravenous, Once, Haviland, Julie, MD .  ondansetron  (ZOFRAN ) injection 4 mg, 4 mg, Intravenous, Once, Haviland, Julie, MD  PHYSICAL EXAM ECOG FS:1 - Symptomatic but completely ambulatory    Vitals:   03/15/24 1133  BP: 106/63  Pulse: (!) 107  Resp: (!) 21  Temp: 98.3 F (36.8 C)  TempSrc: Oral  SpO2: 98%   Physical Exam Vitals and nursing note reviewed.  Constitutional:      Appearance: He is not ill-appearing or toxic-appearing.     Comments: Frail appearing male  HENT:     Head: Normocephalic.     Comments: Right sided face swelling. Handling secretions. Eyes:     Conjunctiva/sclera: Conjunctivae normal.  Neck:     Vascular: JVD present.     Comments: Right sided neck swelling. Tender to palpation. No overlying erythema Cardiovascular:     Rate and Rhythm: Regular rhythm. Tachycardia present.     Pulses: Normal pulses.          Radial pulses are 2+ on the left side.  Heart sounds: Normal heart sounds.  Pulmonary:     Effort: Pulmonary effort is normal.     Breath sounds: Normal breath sounds.     Comments: Dry cough during exam Abdominal:      General: There is no distension.  Musculoskeletal:     Cervical back: Normal range of motion.     Comments: No swelling to right upper extremity.  Skin:    General: Skin is warm and dry.  Neurological:     Mental Status: He is alert.       LABORATORY DATA I have reviewed the data as listed    Latest Ref Rng & Units 03/14/2024   11:26 AM 03/13/2024    2:59 PM 02/14/2024    5:04 AM  CBC  WBC 4.0 - 10.5 K/uL 8.7  9.7  9.4   Hemoglobin 13.0 - 17.0 g/dL 6.6  7.0  7.6   Hematocrit 39.0 - 52.0 % 21.1  23.3  24.0   Platelets 150 - 400 K/uL 435  485  447         Latest Ref Rng & Units 03/14/2024   11:26 AM 03/13/2024    2:59 PM 02/13/2024    5:25 AM  CMP  Glucose 70 - 99 mg/dL 98  960  454   BUN 8 - 23 mg/dL 19  13  13    Creatinine 0.61 - 1.24 mg/dL 0.98  1.19  1.47   Sodium 135 - 145 mmol/L 135  135  135   Potassium 3.5 - 5.1 mmol/L 4.1  3.6  3.9   Chloride 98 - 111 mmol/L 98  98  104   CO2 22 - 32 mmol/L 28  26  22    Calcium  8.9 - 10.3 mg/dL 9.0  9.3  8.7   Total Protein 6.5 - 8.1 g/dL 7.1  7.5    Total Bilirubin 0.0 - 1.2 mg/dL 0.3  0.3    Alkaline Phos 38 - 126 U/L 99  108    AST 15 - 41 U/L 21  16    ALT 0 - 44 U/L 18  18         RADIOGRAPHIC STUDIES (from last 24 hours if applicable) I have personally reviewed the radiological images as listed and agreed with the findings in the report. No results found.      Visit Diagnosis: 1. Anemia, unspecified type   2. Neck swelling   3. Neoplastic malignant related fatigue      No orders of the defined types were placed in this encounter.   All questions were answered. The patient knows to call the clinic with any problems, questions or concerns. No barriers to learning was detected.  A total of more than 40 minutes were spent on this encounter with face-to-face time and non-face-to-face time, including preparing to see the patient, ordering tests and/or medications, counseling the patient and coordination of care as  outlined above.    Thank you for allowing me to participate in the care of this patient.    Daxtyn Rottenberg E  Walisiewicz, PA-C Department of Hematology/Oncology [facility] Phone: [phone]  [phone]    03/15/2024 12:11 PM

## 2024-03-15 NOTE — Patient Instructions (Signed)

## 2024-03-15 NOTE — Telephone Encounter (Signed)
 Mr Mose seen in infusion while receiving blood. He states that he is doing fine. He is eating, drinking, and urinating well. He knows to call the office at [phone] if he has any questions or concerns.

## 2024-03-15 NOTE — ED Notes (Signed)
 ED TO INPATIENT HANDOFF REPORT  Name/Age/Gender Jeff Romero 63 y.o. male  Code Status    Code Status Orders  (From admission, onward)           Start     Ordered   03/15/24 1737  Full code  Continuous       Question:  By:  Answer:  Consent: discussion documented in EHR   03/15/24 1740           Code Status History     Date Active Date Inactive Code Status Order ID Comments User Context   02/12/2024 2307 02/15/2024 1535 Full Code [id number]  Kenny Peals, MD ED   01/21/2024 0052 01/22/2024 2207 Full Code [id number]  Willadean Hark, MD ED   07/13/2016 0309 07/15/2016 1813 Full Code [id number]  Angelene Kelly, MD ED       Home/SNF/Other Home  Chief Complaint Thrombosis [I82.90]  Level of Care/Admitting Diagnosis ED Disposition     ED Disposition  Admit   Condition  --   Comment  Hospital Area: [facility] [[id number]]  Level of Care: Med-Surg [16]  May admit patient to [facility] or [facility] if equivalent level of care is available:: Yes  Covid Evaluation: Asymptomatic - no recent exposure (last 10 days) testing not required  Diagnosis: Thrombosis [id number]  Admitting Physician: KRUGH, MARISSA C [[id number]]  Attending Physician: KRUGH, MARISSA C [[id number]]  Certification:: I certify this patient will need inpatient services for at least 2 midnights  Expected Medical Readiness: 03/17/2024          Medical History Past Medical History:  Diagnosis Date   Allergy    Anxiety    Arthritis    Blood transfusion without reported diagnosis    Cataract    Depression    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   Prostate cancer (HCC)     Allergies Allergies  Allergen Reactions   Breyna  [Budesonide -Formoterol  Fumarate] Cough    Worsened existing cough    IV Location/Drains/Wounds Patient Lines/Drains/Airways Status     Active Line/Drains/Airways     Name Placement date Placement time Site Days    Peripheral IV 03/15/24 22 G Anterior;Right Forearm 03/15/24  0910  Forearm  less than 1   Peripheral IV 03/15/24 20 G Left Antecubital 03/15/24  1238  Antecubital  less than 1            Labs/Imaging Results for orders placed or performed during the hospital encounter of 03/15/24 (from the past 48 hours)  Basic metabolic panel     Status: Abnormal   Collection Time: 03/15/24 12:34 PM  Result Value Ref Range   Sodium 133 (L) 135 - 145 mmol/L   Potassium 2.9 (L) 3.5 - 5.1 mmol/L   Chloride 98 98 - 111 mmol/L   CO2 25 22 - 32 mmol/L   Glucose, Bld 122 (H) 70 - 99 mg/dL    Comment: Glucose reference range applies only to samples taken after fasting for at least 8 hours.   BUN 16 8 - 23 mg/dL   Creatinine, Ser 4.40 0.61 - 1.24 mg/dL   Calcium  8.6 (L) 8.9 - 10.3 mg/dL   GFR, Estimated >10 >27 mL/min    Comment: (NOTE) Calculated using the CKD-EPI Creatinine Equation (2021)    Anion gap 10 5 - 15    Comment: Performed at [facility], [address], [place], [place] [address]  Brain natriuretic peptide  Status: None   Collection Time: 03/15/24 12:34 PM  Result Value Ref Range   B Natriuretic Peptide 30.9 0.0 - 100.0 pg/mL    Comment: Performed at [facility], [address], [place], [place] [address]  CBC with Differential     Status: Abnormal   Collection Time: 03/15/24 12:34 PM  Result Value Ref Range   WBC 9.3 4.0 - 10.5 K/uL   RBC 2.91 (L) 4.22 - 5.81 MIL/uL   Hemoglobin 7.1 (L) 13.0 - 17.0 g/dL   HCT 60.4 (L) 54.0 - 98.1 %   MCV 80.4 80.0 - 100.0 fL   MCH 24.4 (L) 26.0 - 34.0 pg   MCHC 30.3 30.0 - 36.0 g/dL   RDW 19.1 (H) 47.8 - 29.5 %   Platelets 396 150 - 400 K/uL   nRBC 0.0 0.0 - 0.2 %   Neutrophils Relative % 83 %   Neutro Abs 7.8 (H) 1.7 - 7.7 K/uL   Lymphocytes Relative 5 %   Lymphs Abs 0.4 (L) 0.7 - 4.0 K/uL   Monocytes Relative 10 %   Monocytes Absolute 0.9 0.1 - 1.0 K/uL   Eosinophils Relative 1 %   Eosinophils  Absolute 0.1 0.0 - 0.5 K/uL   Basophils Relative 1 %   Basophils Absolute 0.1 0.0 - 0.1 K/uL   Immature Granulocytes 0 %   Abs Immature Granulocytes 0.04 0.00 - 0.07 K/uL    Comment: Performed at [facility], [address] [place], [place] [address]  Magnesium      Status: None   Collection Time: 03/15/24 12:34 PM  Result Value Ref Range   Magnesium  1.7 1.7 - 2.4 mg/dL    Comment: Performed at [facility], [address], [place], [place] [address]   CT Soft Tissue Neck W Contrast Addendum Date: 03/15/2024 ADDENDUM REPORT: 03/15/2024 15:04 ADDENDUM: These results were called by telephone at the time of interpretation on 03/15/2024 at 3:04 pm to provider JULIE HAVILAND , who verbally acknowledged these results. Electronically Signed   By: Denny Flack M.D.   On: 03/15/2024 15:04   Result Date: 03/15/2024 CLINICAL DATA:  Neck mass, nonpulsatile.  Neck swelling. EXAM: CT NECK WITH CONTRAST TECHNIQUE: Multidetector CT imaging of the neck was performed using the standard protocol following the bolus administration of intravenous contrast. RADIATION DOSE REDUCTION: This exam was performed according to the departmental dose-optimization program which includes automated exposure control, adjustment of the mA and/or kV according to patient size and/or use of iterative reconstruction technique. CONTRAST:  75mL OMNIPAQUE  IOHEXOL  350 MG/ML SOLN COMPARISON:  Same day CTA chest and CTA chest dated 02/12/2024. PET CT 02/01/2024. FINDINGS: Pharynx and larynx: The nasopharynx is symmetric. Slight asymmetry of the left palatine tonsil. The oral cavity and floor of mouth are unremarkable. Normal appearance of the base of tongue. Normal appearance of the epiglottis. Aryepiglottic folds are symmetric. Vocal folds are symmetric. Salivary glands: No inflammation, mass, or stone. Thyroid: Normal. Lymph nodes: There is a lymph node conglomerate in right cervical level IV and involving  the right supraclavicular fossa which measures 2.6 x 2.0 cm which appears similar to the prior PET CT. There are additional enlarged lymph nodes extending inferior to this nodal conglomerate which abuts the right brachiocephalic vein measuring up to 1.7 cm with central hypoattenuation concerning for central necrosis (series 2, image 104). Vascular: There is a filling defect within the right external jugular vein noted extending superiorly to the level of the angle of the  mandible (series 2, image 53). The filling defect extends inferiorly within the external jugular vein with eventual non opacification of the vessel. There is significant distension of the external jugular vein within the right supraclavicular fossa noted on series 2, image 85). There is associated edema and stranding within the right neck along the sternocleidomastoid muscle extending into the posterior cervical region and supraclavicular fossa. There is likely narrowing of the right brachiocephalic vein due to enlarged lower cervical/supraclavicular lymph nodes. Mild atherosclerosis of the carotid bifurcations without high-grade stenosis appreciated. Limited intracranial: Negative. Visualized orbits: Negative. Mastoids and visualized paranasal sinuses: Air-fluid level in the left maxillary sinus. Mastoid air cells are clear. Skeleton: No acute or aggressive finding. Degenerative changes in the visualized cervical spine. Upper chest: Thoracic findings better evaluated on same day CTA chest. There is redemonstration of right pleural effusion and necrotic right upper lobe mass as well as abnormal appearing superior mediastinal lymph nodes. Emphysema noted. Other: None. IMPRESSION: There is thrombosis of the right external jugular vein extending from the level of the angle of the mandible inferiorly into the supraclavicular fossa. The vessel is significantly distended within the supraclavicular fossa with surrounding edema and stranding. Centrally  necrotic lymph nodes in cervical level IV/supraclavicular fossa corresponding to findings on recent PET CT. Centrally necrotic nodal conglomerate abuts the right brachiocephalic vein and likely results in narrowing of the vessel. Mild asymmetric prominence of the left palatine tonsil. Recommend correlation with direct visualization. Thoracic findings better evaluated on same day CTA chest. Air-fluid level in the left maxillary sinus. Recommend correlation for acute sinusitis. Electronically Signed: By: Denny Flack M.D. On: 03/15/2024 14:56   CT Angio Chest PE W and/or Wo Contrast Result Date: 03/15/2024 CLINICAL DATA:  Neck swelling and mass. Lung cancer. Concern for pulmonary embolism. EXAM: CT ANGIOGRAPHY CHEST WITH CONTRAST TECHNIQUE: Multidetector CT imaging of the chest was performed using the standard protocol during bolus administration of intravenous contrast. Multiplanar CT image reconstructions and MIPs were obtained to evaluate the vascular anatomy. RADIATION DOSE REDUCTION: This exam was performed according to the departmental dose-optimization program which includes automated exposure control, adjustment of the mA and/or kV according to patient size and/or use of iterative reconstruction technique. CONTRAST:  75mL OMNIPAQUE  IOHEXOL  350 MG/ML SOLN COMPARISON:  CT dated 02/12/2024. FINDINGS: Evaluation of this exam is limited due to respiratory motion as streak artifact caused by patient's arms. Evaluation is also limited due to streak artifact caused by dense contrast and overlying support wires. Cardiovascular: There is no cardiomegaly or pericardial effusion. Mild atherosclerotic calcification of the thoracic aorta. No aneurysmal dilatation or dissection. The the origins of the great vessels of the aortic arch appear patent. Evaluation of the pulmonary arteries is limited due to factors above. There are however small bilateral subsegmental pulmonary artery emboli which may be residual from prior  PE. Recurrent acute PE is not excluded. Overall decrease in the clot burden since the prior CT. No evidence of right heart straining. Mediastinum/Nodes: Right hilar mass/adenopathy measures 3.8 x 3.7 cm inferiorly and 3.4 x 3.3 cm superiorly. Left hilar mass/adenopathy measures 2.5 x 2.5 cm, increased since the prior CT. The esophagus is grossly unremarkable no mediastinal fluid collection. Right neck mass/adenopathy with overall interval increase in the size of the supraclavicular lymph nodes compared to prior CT. There is diffuse skin edema and soft tissue swelling of the right side of the neck, new since the prior CT. Lungs/Pleura: Right pleural effusion, increased in size since the prior CT. An area  of consolidative change extending from the right hilum into the right upper lobe relatively similar. There is background of emphysema. There is a 13 mm left lower lobe nodule increased in size since the prior CT. No pneumothorax. The central airways are patent. Upper Abdomen: No acute abnormality. Musculoskeletal: No acute osseous pathology. Review of the MIP images confirms the above findings. IMPRESSION: 1. Small bilateral subsegmental pulmonary artery emboli, likely residual from prior PE. Recurrent acute PE is not excluded. Overall decrease in the clot burden since the prior CT. No evidence of right heart straining. 2. Right pleural effusion, increased in size since the prior CT. 3. Right neck mass/adenopathy with overall interval increase in the size of the supraclavicular lymph nodes compared to prior CT. There is diffuse skin edema and soft tissue swelling of the right neck, new since the prior CT. 4. Bilateral hilar masses/adenopathy, increased in size since the prior CT. 5. A 13 mm left lower lobe nodule, increased in size since the prior CT. 6. Aortic Atherosclerosis (ICD10-I70.0) and Emphysema (ICD10-J43.9). 03/15/2024 These results were called by telephone at the time of interpretation on 03/15/2024 at 2:29  pm to provider JULIE HAVILAND , who verbally acknowledged these results. Electronically Signed   By: Angus Bark M.D.   On: 03/15/2024 14:47    Pending Labs Unresulted Labs (From admission, onward)     Start     Ordered   03/16/24 0500  CBC  Daily,   R      03/15/24 1556   03/16/24 0500  Comprehensive metabolic panel  Tomorrow morning,   R        03/15/24 1740   03/16/24 0500  Protime-INR  Tomorrow morning,   R        03/15/24 1740   03/16/24 0400  APTT  Once-Timed,   STAT        03/15/24 1556   03/16/24 0400  Heparin  level (unfractionated)  Once-Timed,   URGENT        03/15/24 1556            Vitals/Pain Today's Vitals   03/15/24 1700 03/15/24 1744 03/15/24 1815 03/15/24 1911  BP: 128/74     Pulse: (!) 127     Resp: 17     Temp:    100 F (37.8 C)  TempSrc:    Oral  SpO2: 100%     PainSc:  3  9      Isolation Precautions No active isolations  Medications Medications  heparin  ADULT infusion 100 units/mL (25000 units/250mL) (has no administration in time range)  acetaminophen  (TYLENOL ) tablet 1,000 mg (has no administration in time range)  oxyCODONE  (Oxy IR/ROXICODONE ) immediate release tablet 15 mg (has no administration in time range)  fenofibrate tablet 160 mg (has no administration in time range)  rosuvastatin  (CRESTOR ) tablet 10 mg (has no administration in time range)  pantoprazole  (PROTONIX ) EC tablet 40 mg (has no administration in time range)  polyethylene glycol (MIRALAX  / GLYCOLAX ) packet 17 g (has no administration in time range)  senna-docusate (Senokot-S) tablet 2 tablet (has no administration in time range)  guaiFENesin -codeine  100-10 MG/5ML solution 10 mL (has no administration in time range)  benzonatate  (TESSALON ) capsule 200 mg (has no administration in time range)  ipratropium-albuterol  (DUONEB) 0.5-2.5 (3) MG/3ML nebulizer solution 3 mL (has no administration in time range)  sucralfate  (CARAFATE ) tablet 1 g (has no administration in time  range)  0.9 %  sodium chloride  infusion ( Intravenous New Bag/Given 03/15/24 1813)  HYDROmorphone  (DILAUDID ) injection 0.5-1 mg (1 mg Intravenous Given 03/15/24 1815)  senna-docusate (Senokot-S) tablet 1 tablet (has no administration in time range)  ondansetron  (ZOFRAN ) tablet 4 mg (has no administration in time range)    Or  ondansetron  (ZOFRAN ) injection 4 mg (has no administration in time range)  morphine  (PF) 4 MG/ML injection 4 mg (4 mg Intravenous Given 03/15/24 1247)  ondansetron  (ZOFRAN ) injection 4 mg (4 mg Intravenous Given 03/15/24 1246)  potassium chloride  10 mEq in 100 mL IVPB (0 mEq Intravenous Stopped 03/15/24 1636)  potassium chloride  SA (KLOR-CON  M) CR tablet 40 mEq (40 mEq Oral Given 03/15/24 1424)  iohexol  (OMNIPAQUE ) 350 MG/ML injection 75 mL (75 mLs Intravenous Contrast Given 03/15/24 1342)  morphine  (PF) 4 MG/ML injection 4 mg (4 mg Intravenous Given 03/15/24 1430)  guaiFENesin -codeine  100-10 MG/5ML solution 5 mL (5 mLs Oral Given 03/15/24 1539)  HYDROmorphone  (DILAUDID ) injection 1 mg (1 mg Intravenous Given 03/15/24 1540)  magnesium  sulfate IVPB 2 g 50 mL (0 g Intravenous Stopped 03/15/24 1636)    Mobility walks

## 2024-03-15 NOTE — Progress Notes (Signed)
 PHARMACY - ANTICOAGULATION CONSULT NOTE  Pharmacy Consult for IV UFH (PTA apixaban ) Indication: pulmonary embolus  No Known Allergies  Patient Measurements:    Vital Signs: Temp: 98.2 F (36.8 C) (06/11 1150) Temp Source: Oral (06/11 1150) BP: 144/86 (06/11 1524) Pulse Rate: 124 (06/11 1524)  Labs: Recent Labs    03/13/24 1459 03/14/24 1126 03/15/24 1234  HGB 7.0* 6.6* 7.1*  HCT 23.3* 21.1* 23.4*  PLT 485* 435* 396  CREATININE 0.71 0.74 0.93    Estimated Creatinine Clearance: 62 mL/min (by C-G formula based on SCr of 0.93 mg/dL).   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 Patient with recent B PE started on apixaban . Last dose was reportedly this AM 6/11 at 0800. Now to transition to IV heparin  for time being per Md order. Baseline labs drawn - Hgb 7.1 s/p 1 unit blood, will continue to monitor closely  Goal of Therapy:  Heparin  level 0.3-0.7 units/ml aPTT 66-102 seconds Monitor platelets by anticoagulation protocol: Yes   Plan:  With last apixaban  dose being at 0800 this AM, will start IV heparin  at rate of 1100 units/hr tonight at 2000 with NO bolus when next dose of apixaban  would have been due.  Check aPTT and Heparin  level 8 hours after starting heparin . HL will be falsely elevated due to apixaban  so will check aPTT levels until both values correlate appropriately.  Daily CBC    Luise Saint, PharmD, BCPS Secure Chat if ?s 03/15/2024 3:53 PM

## 2024-03-16 ENCOUNTER — Other Ambulatory Visit: Payer: Self-pay | Admitting: Medical Oncology

## 2024-03-16 ENCOUNTER — Encounter: Admitting: Dietician

## 2024-03-16 ENCOUNTER — Ambulatory Visit: Admitting: Physician Assistant

## 2024-03-16 ENCOUNTER — Telehealth: Payer: Self-pay | Admitting: Radiation Oncology

## 2024-03-16 ENCOUNTER — Ambulatory Visit

## 2024-03-16 ENCOUNTER — Other Ambulatory Visit (HOSPITAL_COMMUNITY): Payer: Self-pay

## 2024-03-16 ENCOUNTER — Other Ambulatory Visit

## 2024-03-16 DIAGNOSIS — C3491 Malignant neoplasm of unspecified part of right bronchus or lung: Secondary | ICD-10-CM | POA: Diagnosis not present

## 2024-03-16 DIAGNOSIS — G893 Neoplasm related pain (acute) (chronic): Secondary | ICD-10-CM

## 2024-03-16 DIAGNOSIS — D649 Anemia, unspecified: Secondary | ICD-10-CM

## 2024-03-16 DIAGNOSIS — I82890 Acute embolism and thrombosis of other specified veins: Secondary | ICD-10-CM | POA: Diagnosis not present

## 2024-03-16 DIAGNOSIS — C3411 Malignant neoplasm of upper lobe, right bronchus or lung: Secondary | ICD-10-CM

## 2024-03-16 LAB — COMPREHENSIVE METABOLIC PANEL WITH GFR
ALT: 22 U/L (ref 0–44)
AST: 31 U/L (ref 15–41)
Albumin: 1.9 g/dL — ABNORMAL LOW (ref 3.5–5.0)
Alkaline Phosphatase: 89 U/L (ref 38–126)
Anion gap: 10 (ref 5–15)
BUN: 13 mg/dL (ref 8–23)
CO2: 24 mmol/L (ref 22–32)
Calcium: 8.2 mg/dL — ABNORMAL LOW (ref 8.9–10.3)
Chloride: 95 mmol/L — ABNORMAL LOW (ref 98–111)
Creatinine, Ser: 0.68 mg/dL (ref 0.61–1.24)
GFR, Estimated: >60 mL/min (ref >60)
Glucose, Bld: 104 mg/dL — ABNORMAL HIGH (ref 70–99)
Potassium: 4.5 mmol/L (ref 3.5–5.1)
Sodium: 129 mmol/L — ABNORMAL LOW (ref 135–145)
Total Bilirubin: 0.2 mg/dL (ref 0.0–1.2)
Total Protein: 6 g/dL — ABNORMAL LOW (ref 6.5–8.1)

## 2024-03-16 LAB — CBC
HCT: 22.9 % — ABNORMAL LOW (ref 39.0–52.0)
Hemoglobin: 6.8 g/dL — CL (ref 13.0–17.0)
MCH: 24.4 pg — ABNORMAL LOW (ref 26.0–34.0)
MCHC: 29.7 g/dL — ABNORMAL LOW (ref 30.0–36.0)
MCV: 82.1 fL (ref 80.0–100.0)
Platelets: 326 10*3/uL (ref 150–400)
RBC: 2.79 MIL/uL — ABNORMAL LOW (ref 4.22–5.81)
RDW: 16.7 % — ABNORMAL HIGH (ref 11.5–15.5)
WBC: 8 10*3/uL (ref 4.0–10.5)
nRBC: 0 % (ref 0.0–0.2)

## 2024-03-16 LAB — BPAM RBC
Blood Product Expiration Date: 202507062359
Blood Product Unit Number: 202507062359
ISSUE DATE / TIME: 202506110943
Unit Type and Rh: 202507062359
Unit Type and Rh: 202507062359
Unit Type and Rh: 5100
Unit Type and Rh: 5100

## 2024-03-16 LAB — TYPE AND SCREEN
ABO/RH(D): A POS
Antibody Screen: POSITIVE
Donor AG Type: NEGATIVE
Donor AG Type: NEGATIVE
PT AG Type: NEGATIVE
Unit division: 0
Unit division: 0

## 2024-03-16 LAB — HEMOGLOBIN AND HEMATOCRIT, BLOOD
HCT: 25.1 % — ABNORMAL LOW (ref 39.0–52.0)
Hemoglobin: 7.7 g/dL — ABNORMAL LOW (ref 13.0–17.0)

## 2024-03-16 LAB — APTT
aPTT: 62 s — ABNORMAL HIGH (ref 24–36)
aPTT: 82 s — ABNORMAL HIGH (ref 24–36)
aPTT: 55 s — ABNORMAL HIGH (ref 24–36)

## 2024-03-16 LAB — PROTIME-INR
INR: 1.5 — ABNORMAL HIGH (ref 0.8–1.2)
Prothrombin Time: 18.4 s — ABNORMAL HIGH (ref 11.4–15.2)

## 2024-03-16 LAB — PREPARE RBC (CROSSMATCH)

## 2024-03-16 LAB — HEPARIN LEVEL (UNFRACTIONATED)
Heparin Unfractionated: >1.1 [IU]/mL — ABNORMAL HIGH (ref 0.30–0.70)
Heparin Unfractionated: 0.57 [IU]/mL (ref 0.30–0.70)

## 2024-03-16 MED ORDER — SENNOSIDES-DOCUSATE SODIUM 8.6-50 MG PO TABS
1.0000 | ORAL_TABLET | Freq: Two times a day (BID) | ORAL | Status: DC
  Administered 2024-03-16 – 2024-03-18 (×4): 1 via ORAL
  Filled 2024-03-16 (×4): qty 1

## 2024-03-16 MED ORDER — SODIUM CHLORIDE 0.9% IV SOLUTION
Freq: Once | INTRAVENOUS | Status: AC
Start: 2024-03-16 — End: 2024-03-17

## 2024-03-16 MED ORDER — OXYCODONE HCL ER 10 MG PO T12A
10.0000 mg | EXTENDED_RELEASE_TABLET | Freq: Two times a day (BID) | ORAL | Status: DC
  Administered 2024-03-16 – 2024-03-18 (×5): 10 mg via ORAL
  Filled 2024-03-16 (×5): qty 1

## 2024-03-16 MED ORDER — HEPARIN BOLUS VIA INFUSION
1000.0000 [IU] | Freq: Once | INTRAVENOUS | Status: AC
  Administered 2024-03-16: 1000 [IU] via INTRAVENOUS
  Filled 2024-03-16: qty 1000

## 2024-03-16 MED ORDER — BISACODYL 10 MG RE SUPP: 10.0000 mg | Freq: Every day | RECTAL | Status: DC | PRN

## 2024-03-16 MED ORDER — ENSURE PLUS HIGH PROTEIN PO LIQD
237.0000 mL | Freq: Three times a day (TID) | ORAL | Status: DC
  Administered 2024-03-16 – 2024-03-18 (×5): 237 mL via ORAL

## 2024-03-16 NOTE — Telephone Encounter (Signed)
 Received referral for an urgent inpatient consult, per PA Allana Ishikawa no Rad Onc consult needed. Closing referral at this time.

## 2024-03-16 NOTE — Progress Notes (Signed)
 PHARMACY - ANTICOAGULATION CONSULT NOTE  Pharmacy Consult for IV UFH (PTA apixaban ) Indication: pulmonary embolus  Allergies  Allergen Reactions   Breyna  [Budesonide -Formoterol  Fumarate] Cough    Worsened existing cough    Patient Measurements: Height: 5' 7 (170.2 cm) Weight: 53.1 kg (117 lb 1 oz) IBW/kg (Calculated) : 66.1 HEPARIN  DW (KG): 53.1  Vital Signs: Temp: 98.3 F (36.8 C) (06/12 0355) Temp Source: Oral (06/12 0355) BP: 112/55 (06/12 0355) Pulse Rate: 106 (06/12 0355)  Labs: Recent Labs    03/14/24 1126 03/15/24 1234 03/16/24 0449  HGB 6.6* 7.1* 6.8*  HCT 21.1* 23.4* 22.9*  PLT 435* 396 326  APTT  --   --  62*  LABPROT  --   --  18.4*  INR  --   --  1.5*  HEPARINUNFRC  --   --  >1.10*  CREATININE 0.74 0.93 0.68    Estimated Creatinine Clearance: 71.9 mL/min (by C-G formula based on SCr of 0.68 mg/dL).   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   Prostate cancer (HCC)     Medications:  Scheduled:   sodium chloride    Intravenous Once   acetaminophen   1,000 mg Oral Q8H   benzonatate   200 mg Oral TID   feeding supplement  237 mL Oral BID BM   fenofibrate  160 mg Oral Daily   pantoprazole   40 mg Oral Daily   polyethylene glycol  17 g Oral Daily   rosuvastatin   10 mg Oral Daily   senna-docusate  2 tablet Oral QHS   sucralfate   1 g Oral TID WC & HS   Infusions:   sodium chloride  40 mL/hr at 03/16/24 0557   heparin  1,100 Units/hr (03/16/24 0557)    Assessment: 63 yo from [facility] after getting blood transfusion for Hgb of 6.6 and tachycardia. 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 Patient with recent B PE started on apixaban . Last dose was reportedly this AM 6/11 at 0800. Now to transition to IV heparin  for  time being per Md order. Baseline labs drawn - Hgb 7.1 s/p 1 unit blood, will continue to monitor closely  Today, 03/16/24  Hgb 6.8 low but consistent with previous results  1 unit of PRBC ordered  HL >1.10, elevated as expected aPTT Is 62 sec, slightly subtherapeutic  SCr < 1    Goal of Therapy:  Heparin  level 0.3-0.7 units/ml aPTT 66-102 seconds Monitor platelets by anticoagulation protocol: Yes   Plan:  Increase heparin  to 1250  units/hr  .  Check aPTT  8 hours after starting heparin  rate change . HL will be falsely elevated due to apixaban  so will check aPTT levels until both values correlate appropriately.  Daily CBC     Van Gelinas, PharmD, BCPS 03/16/2024 6:55 AM

## 2024-03-16 NOTE — Consult Note (Signed)
 I spoke with the patient and his wife this afternoon.  In summary this is a pleasant 63 year old gentleman diagnosed with Stage IV, cT2bN2M1c, NSCLC, NOS of the RUL.  He recently completed a palliative course of radiotherapy to the right lung and did develop fatigue and esophagitis as well as some skin changes due to using a heating pad over the area during the course of treatment.  His symptoms have improved in this area however he has had known disease including the cervical lymph nodes and iliac region from his prior PET scan in April.  He has just recently began treatment with his first dose of Keytruda immunotherapy being administered on 03/13/2024.  In the last week, the patient has been experiencing an increase in fullness and also visible swelling of his right neck and was evaluated for this on a day he was here for evaluation with Dr. Marguerita Shih and for blood products and initially the patient had been having episodes of tachycardia that has persisted and given clinical symptoms of JVD it was originally felt that he would benefit from a cardiology evaluation however returned with persistent and progressive swelling and was sent to the emergency room.  While being worked up CT angiography confirmed bilateral 2nd and 3rd order pulmonary emboli without heart strain a large necrotic right upper lobe mass overall relatively stable as well as stable mediastinal and hilar adenopathy with a new right effusion, infiltrative change in the right lower lobe and new/increasing right lower lobe and left lower lobe nodules.  He had a CT of the neck with contrast that also confirmed thrombus in the right external jugular vein extending from the level of the angle of the mandible inferior into the supraclavicular fossa significantly distending the vessel within the supraclavicular fossa and surrounding edema and stranding there were centrally necrotic lymph nodes in the cervical level corresponding to the known areas on PET/CT  and a new conglomerate abutting the right brachiocephalic vein and likely resulting in narrowing of the vessel.  He has been struggling with pain in the outpatient setting to and working with palliative medicine.  He has been trying to use short acting oxycodone  and was just recently started on long-acting but has not been able to start this yet in the outpatient setting.  We have been asked to consider an additional course of palliative radiation to the neck and possibly to symptoms in the hip region.  Of note the patient also has symptoms of pain in the buttock area due to what his wife describes as a decubitus ulcer.  We discussed the consideration after Dr. Jeryl Moris has reviewed his case for giving an additional palliative course of radiation to the right neck, we could also map out the area that he is having pain in the lower hip region to determine if this correlates to his known area of iliac disease seen on prior PET.  If so we could consider 10 treatments to this area as well as the cervical neck nodes.  We discussed the risks, benefits, short and long-term effects and risks of radiation and the patient is interested in proceeding.  Given that he also had a questionable area of either prior infarct versus metastatic disease on a prior staging MRI scan we will order an MRI to be performed with 3T imaging at [facility]  He will need coordination through the [facility] system to have this scan performed.  We also discussed changes to his Carafate  which should include crushing his medication to make a  slurry for esophagitis related to prior radiation and for this to be used as well if he is having symptoms of esophagitis from the treatment to the neck.  He will come down tomorrow in our department for simulation.  I have also reached out to his primary hospital team and outpatient palliative care nurse practitioner to try and encourage a team-based approach to his pain management as we do not expect relief or improvement  of his pain until we complete a 2-week course of therapy.  The patient and his wife are in agreement with this plan.     Shelvia Dick, PAC

## 2024-03-16 NOTE — Progress Notes (Signed)
 PHARMACY - ANTICOAGULATION CONSULT NOTE  Pharmacy Consult for heparin  (apixaban  on hold) Indication: hx PE, right external jugular vein thrombosis  Allergies  Allergen Reactions   Breyna  [Budesonide -Formoterol  Fumarate] Cough    Worsened existing cough    Patient Measurements: Height: 5' 7 (170.2 cm) Weight: 53.1 kg (117 lb 1 oz) IBW/kg (Calculated) : 66.1 HEPARIN  DW (KG): 53.1  Vital Signs: Temp: 98.6 F (37 C) (06/12 1600) Temp Source: Oral (06/12 1600) BP: 111/76 (06/12 1600) Pulse Rate: 109 (06/12 1314)  Labs: Recent Labs    03/14/24 1126 03/15/24 1234 03/16/24 0449 03/16/24 1257  HGB 6.6* 7.1* 6.8*  --   HCT 21.1* 23.4* 22.9*  --   PLT 435* 396 326  --   APTT  --   --  62* 82*  LABPROT  --   --  18.4*  --   INR  --   --  1.5*  --   HEPARINUNFRC  --   --  >1.10*  --   CREATININE 0.74 0.93 0.68  --     Estimated Creatinine Clearance: 71.9 mL/min (by C-G formula based on SCr of 0.68 mg/dL).   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   Prostate cancer (HCC)     Medications: apixaban  5 mg PO BID -Last dose reported: 6/11 @ 08:00  Assessment: Pt is a 71 yoM with PMH significant for metastatic NSCLC. Pt received first dose of chemotherapy (Keytruda) on 03/13/24. Pt was recently diagnosed with PE in May 2025 for which he is prescribed apixaban . Pt found to have thrombosis of right external jugular vein. Anticoagulation transitioned from apixaban  to heparin  - pharmacy to dose.   Heparin  level is falsely elevated due to recent DOAC  Today, 03/16/24 aPTT 55 seconds, decreased to sub-therapeutic on heparin  1250 units/hr Heparin  level 0.57, likely remains falsely elevated and not correlating with aPTT CBC: Hgb low, Plt WNL No pauses/interruption and no  bleeding/complications per RN.  Goal of Therapy:  Heparin  level 0.3-0.7 units/ml aPTT 66-102 seconds Monitor platelets by anticoagulation protocol: Yes   Plan:  Give heparin  1000 units bolus IV x 1 Increase to heparin  IV infusion at 1400 units/hr aPTT in 6 hours after rate change Daily aPTT, heparin  level,  and CBC Once aPTT and heparin  level correlate, can monitor using heparin  level only Monitor for signs of bleeding    Kendall Pauls PharmD, BCPS [facility] main pharmacy [phone] 03/16/2024 7:46 PM

## 2024-03-16 NOTE — Progress Notes (Signed)
 Transition of Care [facility]) - Inpatient Brief Assessment  Patient Details  Name: Jeff Romero MRN: [id number] Date of Birth: 02/15/1961  [facility]) CM/SW Contact:    Zenon Hilda, LCSW Phone Number: 03/16/2024, 9:24 AM  Clinical Narrative: Screening completed. No TOC needs identified at this time.  Transition of Care Asessment: Insurance and Status: Insurance coverage has been reviewed Patient has primary care physician: Yes Home environment has been reviewed: Resides in single family home with spouse Prior level of function:: Independent with ADLs at baseline Prior/Current Home Services: No current home services Social Drivers of Health Review: SDOH reviewed no interventions necessary Readmission risk has been reviewed: Yes Transition of care needs: no transition of care needs at this time   03/16/24 0923  Readmission Prevention Plan - High Risk  Transportation Screening Complete  Home Care Consult (High Risk) Complete  High Risk Social Work Consult for recovery care planning/counseling (includes patient and caregiver) Complete  High Risk Palliative Care Screening Not Applicable  Medication Review Complete

## 2024-03-16 NOTE — Progress Notes (Signed)
 PHARMACY - ANTICOAGULATION CONSULT NOTE  Pharmacy Consult for heparin  (apixaban  on hold) Indication: hx PE, right external jugular vein thrombosis  Allergies  Allergen Reactions   Breyna  [Budesonide -Formoterol  Fumarate] Cough    Worsened existing cough    Patient Measurements: Height: 5' 7 (170.2 cm) Weight: 53.1 kg (117 lb 1 oz) IBW/kg (Calculated) : 66.1 HEPARIN  DW (KG): 53.1  Vital Signs: Temp: 98 F (36.7 C) (06/12 1314) Temp Source: Oral (06/12 1314) BP: 112/58 (06/12 1314) Pulse Rate: 109 (06/12 1314)  Labs: Recent Labs    03/14/24 1126 03/15/24 1234 03/16/24 0449 03/16/24 1257  HGB 6.6* 7.1* 6.8*  --   HCT 21.1* 23.4* 22.9*  --   PLT 435* 396 326  --   APTT  --   --  62* 82*  LABPROT  --   --  18.4*  --   INR  --   --  1.5*  --   HEPARINUNFRC  --   --  >1.10*  --   CREATININE 0.74 0.93 0.68  --     Estimated Creatinine Clearance: 71.9 mL/min (by C-G formula based on SCr of 0.68 mg/dL).   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   Prostate cancer (HCC)     Medications: apixaban  5 mg PO BID -Last dose reported: 6/11 @ 08:00  Assessment: Pt is a 43 yoM with PMH significant for metastatic NSCLC. Pt received first dose of chemotherapy (Keytruda) on 03/13/24. Pt was recently diagnosed with PE in May 2025 for which he is prescribed apixaban . Pt found to have thrombosis of right external jugular vein. Anticoagulation transitioned from apixaban  to heparin  - pharmacy to dose.   Heparin  level is falsely elevated due to recent DOAC  Today, 03/16/24 aPTT = 82 seconds is therapeutic on heparin  infusion of 1250 units/hr CBC:  Hgb 6.8 (low but consistent with previous values), transfused 1 unit PRBC 6/11, another today Plt WNL Confirmed with RN - no signs of  bleeding  Goal of Therapy:  Heparin  level 0.3-0.7 units/ml aPTT 66-102 seconds Monitor platelets by anticoagulation protocol: Yes   Plan:  Continue heparin  at 1250 units/hr Check confirmatory 6 hour aPTT CBC, heparin  level, aPTT daily Once aPTT and heparin  level correlate, can monitor using heparin  level only Monitor for signs of bleeding  Shireen Dory, PharmD 03/16/2024,1:34 PM

## 2024-03-16 NOTE — Progress Notes (Signed)
 Initial Nutrition Assessment  DOCUMENTATION CODES:   Severe malnutrition in context of chronic illness, Underweight  INTERVENTION:   -Ensure Plus High Protein po TID, each supplement provides 350 kcal and 20 grams of protein.   NUTRITION DIAGNOSIS:   Severe Malnutrition related to chronic illness, cancer and cancer related treatments as evidenced by severe fat depletion, severe muscle depletion.  GOAL:   Patient will meet greater than or equal to 90% of their needs  MONITOR:   PO intake, Supplement acceptance  REASON FOR ASSESSMENT:   Consult Assessment of nutrition requirement/status  ASSESSMENT:   63 y.o. 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  On presentation in the ED, CT soft tissue neck with contrast showed thrombosis of the right external jugular vein and necrotic lymph nodes.  Patient in room with multiple family members at bedside. Per pt and pt's wife, pt had been trying to follow a strict plant based diet with no meat. He noticed he was losing weight so decided to start incorporating more meat in his diet. Has Ensure at home and has been drinking them. Recommended pt try and drink 2-3 Ensures daily to help with weight gain. Pt denies any issues with chewing or swallowing.  Per weight records, pt has lost 7 lbs since 5/5 (5% wt loss x >1 month, insignificant for time frame).    Medications: Miralax , Senokot, Carafate   Labs reviewed: Low Na   NUTRITION - FOCUSED PHYSICAL EXAM:  Flowsheet Row Most Recent Value  Orbital Region Severe depletion  Upper Arm Region Severe depletion  Thoracic and Lumbar Region Unable to assess  Buccal Region Severe depletion  Temple Region  Severe depletion  Clavicle Bone Region Severe depletion  Clavicle and Acromion Bone Region Severe depletion  Scapular Bone Region Severe depletion  Dorsal Hand Severe depletion  Patellar Region Unable to assess  Anterior Thigh Region Unable to assess  Posterior Calf Region Unable to assess  Edema (RD Assessment) None  Hair Unable to assess  [covered]  Eyes Reviewed  Mouth Reviewed  Skin Reviewed  Nails Reviewed    Diet Order:   Diet Order             Diet regular Room service appropriate? Yes; Fluid consistency: Thin  Diet effective now                   EDUCATION NEEDS:   Education needs have been addressed  Skin:  Skin Assessment: Skin Integrity Issues: Skin Integrity Issues:: Stage II Stage II: left buttocks  Last BM:  6/10  Height:   Ht Readings from Last 1 Encounters:  03/15/24 5' 7 (1.702 m)    Weight:   Wt Readings from Last 1 Encounters:  03/15/24 53.1 kg    BMI:  Body mass index is 18.33 kg/m.  Estimated Nutritional Needs:   Kcal:  2100-2300  Protein:  90-110g  Fluid:  2.3L/day  Arna Better, MS, RD, LDN Inpatient Clinical Dietitian Contact via Secure chat

## 2024-03-16 NOTE — Telephone Encounter (Signed)
 error

## 2024-03-16 NOTE — Evaluation (Addendum)
 Physical Therapy Evaluation Patient Details Name: Jeff Romero MRN: [id number] DOB: 1961-01-09 Today's Date: 03/16/2024  History of Present Illness  63 year old male patient with diagnosis of non-small cell lung cancer with bone mets.  Patient began to experience lumps on the right side of his neck which he noted increased in size and became painful, therefore came to the cancer center for evaluation.  Subsequently admitted and diagnosed with thrombosis in R external jugular vein. PMH: HLD, COPD, GERD and PE.  Clinical Impression  Pt is mobilizing well, he ambulated 450' without an assistive device, no loss of balance, SpO2 97% on room air walking. Pt was encouraged to walk in halls at least TID to minimize deconditioning during hospitalization. PT signing off, mobility team to follow.          If plan is discharge home, recommend the following:     Can travel by private vehicle        Equipment Recommendations None recommended by PT  Recommendations for Other Services       Functional Status Assessment Patient has not had a recent decline in their functional status     Precautions / Restrictions Precautions Precautions: None Recall of Precautions/Restrictions: Intact Restrictions Weight Bearing Restrictions Per Provider Order: No      Mobility  Bed Mobility Overal bed mobility: Independent                  Transfers Overall transfer level: Independent                      Ambulation/Gait Ambulation/Gait assistance: Independent Gait Distance (Feet): 450 Feet Assistive device: None Gait Pattern/deviations: WFL(Within Functional Limits) Gait velocity: WFL     General Gait Details: steady, no loss of balance, SpO2 97% on room air walking  Stairs            Wheelchair Mobility     Tilt Bed    Modified Rankin (Stroke Patients Only)       Balance Overall balance assessment: No apparent balance deficits (not formally assessed)                                            Pertinent Vitals/Pain Pain Assessment Pain Assessment: No/denies pain    Home Living Family/patient expects to be discharged to:: Private residence Living Arrangements: Spouse/significant other Available Help at Discharge: Family;Available PRN/intermittently   Home Access: Level entry     Alternate Level Stairs-Number of Steps: flight Home Layout: Two level;Able to live on main level with bedroom/bathroom Home Equipment: None      Prior Function Prior Level of Function : Independent/Modified Independent             Mobility Comments: walks without AD, no falls in past 6 months       Extremity/Trunk Assessment   Upper Extremity Assessment Upper Extremity Assessment: Overall WFL for tasks assessed    Lower Extremity Assessment Lower Extremity Assessment: Overall WFL for tasks assessed    Cervical / Trunk Assessment Cervical / Trunk Assessment: Normal  Communication   Communication Communication: No apparent difficulties    Cognition Arousal: Alert Behavior During Therapy: WFL for tasks assessed/performed   PT - Cognitive impairments: No apparent impairments                         Following commands:  Intact       Cueing       General Comments      Exercises     Assessment/Plan    PT Assessment Patient does not need any further PT services  PT Problem List         PT Treatment Interventions      PT Goals (Current goals can be found in the Care Plan section)  Acute Rehab PT Goals Patient Stated Goal: likes to work out, do Aeronautical engineer PT Goal Formulation: All assessment and education complete, DC therapy    Frequency       Co-evaluation               AM-PAC PT 6 Clicks Mobility  Outcome Measure Help needed turning from your back to your side while in a flat bed without using bedrails?: None Help needed moving from lying on your back to sitting on the side of a flat  bed without using bedrails?: None Help needed moving to and from a bed to a chair (including a wheelchair)?: None Help needed standing up from a chair using your arms (e.g., wheelchair or bedside chair)?: None Help needed to walk in hospital room?: None Help needed climbing 3-5 steps with a railing? : None 6 Click Score: 24    End of Session   Activity Tolerance: Patient tolerated treatment well Patient left: in bed;with family/visitor present Nurse Communication: Mobility status      Time: 1416-1433 PT Time Calculation (min) (ACUTE ONLY): 17 min   Charges:   PT Evaluation $PT Eval Low Complexity: 1 Low   PT General Charges $$ ACUTE PT VISIT: 1 Visit        Daymon Evans PT 03/16/2024  Acute Rehabilitation Services  Office [phone]

## 2024-03-16 NOTE — Progress Notes (Signed)
 PROGRESS NOTE    Jeff Romero  ZOX:[id number] DOB: 1961-04-03 DOA: 03/15/2024 PCP: [facility]   Brief Narrative:  63 y.o. 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  On presentation in the ED, CT soft tissue neck with contrast showed thrombosis of the right external jugular vein and necrotic lymph nodes.  ED provider discussed with oncology who recommended to start heparin  drip.  Assessment & Plan:   Right external jugular vein thrombosis Necrotic lymph nodes in the cervical and supraclavicular area and supraclavicular area History of PE on Eliquis  -Imaging as above.  Patient was on Eliquis  at home for PE. -ED provider discussed with oncology who recommended to start heparin  drip.  Continue heparin  drip.  Follow oncology recommendations regarding anticoagulation choice moving forward  Stage IV non-small cell lung cancer Acute respiratory failure with hypoxia -Remains on 2 L oxygen via nasal cannula.  Wean off as able - Patient is status post radiation and is currently on Keytruda.  I have communicated with Dr. Marguerita Shih via secure chat: He will see the patient in consultation.  Follow recommendations.  Acute on chronic anemia of chronic disease - From lung cancer.  Hemoglobin 6.8 this morning.  No signs of overt bleeding.  1 unit packed red cell transfusion has been ordered.  Monitor H&H.  Transfuse if hemoglobin is less than 7  Hyponatremia - Possibly from poor oral intake.  Sodium worsening to 129 today.  Increase normal saline to 75 cc an hour.  Hypokalemia - Resolved  Hypoalbuminemia Possible severe protein calorie malnutrition - Encourage oral intake.  Consult  nutrition  Hyperlipidemia--continue fenofibrate and statin  Chronic cancer-related pain and opiate dependence -Continue oxycodone  as needed.  Outpatient follow-up with PCP/palliative care  Physical deconditioning--PT eval   DVT prophylaxis: Heparin  drip Code Status: Full Family Communication: Wife at bedside Disposition Plan: Status is: Inpatient Remains inpatient appropriate because: Of severity of illness    Consultants: Oncology  Procedures: None  Antimicrobials: None   Subjective: Patient seen and examined at bedside.  Complains of intermittent cough and shortness of breath with some neck pain.  No fever, chest pain, vomiting reported.  Objective: Vitals:   03/15/24 2213 03/15/24 2310 03/16/24 0355 03/16/24 0742  BP:  100/66 (!) 112/55 (!) 105/59  Pulse:  (!) 107 (!) 106 (!) 110  Resp:   (!) 22 20  Temp:  98.7 F (37.1 C) 98.3 F (36.8 C) 99.2 F (37.3 C)  TempSrc:  Oral Oral Oral  SpO2:  98% 99% 96%  Weight: 53.1 kg     Height: 5' 7 (1.702 m)       Intake/Output Summary (Last 24 hours) at 03/16/2024 1009 Last data filed at 03/16/2024 0637 Gross per 24 hour  Intake 1054.58 ml  Output 645 ml  Net 409.58 ml   Filed Weights   03/15/24 2213  Weight: 53.1 kg    Examination:  General exam: Appears calm and comfortable.  On 2 L oxygen via nasal cannula.  Looks chronically ill and deconditioned. Respiratory system: Bilateral decreased breath sounds at bases with scattered crackles and tachypnea Cardiovascular system: S1 & S2 heard, mild intermittent tachycardia present gastrointestinal system: Abdomen is nondistended, soft and nontender. Normal bowel sounds heard. Extremities: No cyanosis, clubbing,  edema  Central nervous system: Alert and oriented.  Slow to respond.  No focal neurological deficits. Moving extremities Skin: No rashes, lesions or ulcers Psychiatry: Flat affect.  Not agitated.   Data Reviewed: I have personally reviewed following labs and  imaging studies  CBC: Recent Labs  Lab 03/13/24 1459 03/14/24 1126 03/15/24 1234 03/16/24 0449  WBC 9.7 8.7 9.3 8.0  NEUTROABS 8.5* 7.5 7.8*  --   HGB 7.0* 6.6* 7.1* 6.8*  HCT 23.3* 21.1* 23.4* 22.9*  MCV 76.1* 75.9* 80.4 82.1  PLT 485* 435* 396 326   Basic Metabolic Panel: Recent Labs  Lab 03/13/24 1459 03/14/24 1126 03/15/24 1234 03/16/24 0449  NA 135 135 133* 129*  K 3.6 4.1 2.9* 4.5  CL 98 98 98 95*  CO2 26 28 25 24   GLUCOSE 137* 98 122* 104*  BUN 13 19 16 13   CREATININE 0.71 0.74 0.93 0.68  CALCIUM  9.3 9.0 8.6* 8.2*  MG  --   --  1.7  --    GFR: Estimated Creatinine Clearance: 71.9 mL/min (by C-G formula based on SCr of 0.68 mg/dL). Liver Function Tests: Recent Labs  Lab 03/13/24 1459 03/14/24 1126 03/16/24 0449  AST 16 21 31   ALT 18 18 22   ALKPHOS 108 99 89  BILITOT 0.3 0.3 0.2  PROT 7.5 7.1 6.0*  ALBUMIN 3.3* 3.1* 1.9*   No results for input(s): LIPASE, AMYLASE in the last 168 hours. No results for input(s): AMMONIA in the last 168 hours. Coagulation Profile: Recent Labs  Lab 03/16/24 0449  INR 1.5*   Cardiac Enzymes: No results for input(s): CKTOTAL, CKMB, CKMBINDEX, TROPONINI in the last 168 hours. BNP (last 3 results) No results for input(s): PROBNP in the last 8760 hours. HbA1C: No results for input(s): HGBA1C in the last 72 hours. CBG: No results for input(s): GLUCAP in the last 168 hours. Lipid Profile: No results for input(s): CHOL, HDL, LDLCALC, TRIG, CHOLHDL, LDLDIRECT in the last 72 hours. Thyroid Function Tests: Recent Labs    03/13/24 1459  TSH 1.200  T4TOTAL 9.5   Anemia Panel: Recent Labs    03/14/24 1126  FERRITIN 932*  TIBC 246*  IRON 10*   Sepsis Labs: No results for input(s): PROCALCITON, LATICACIDVEN in the last 168 hours.  No results found for this or any previous visit (from the past 240 hours).       Radiology Studies: CT Soft Tissue Neck W Contrast Addendum  Date: 03/15/2024 ADDENDUM REPORT: 03/15/2024 15:04 ADDENDUM: These results were called by telephone at the time of interpretation on 03/15/2024 at 3:04 pm to provider JULIE HAVILAND , who verbally acknowledged these results. Electronically Signed   By: Denny Flack M.D.   On: 03/15/2024 15:04   Result Date: 03/15/2024 CLINICAL DATA:  Neck mass, nonpulsatile.  Neck swelling. EXAM: CT NECK WITH CONTRAST TECHNIQUE: Multidetector CT imaging of the neck was performed using the standard protocol following the bolus administration of intravenous contrast. RADIATION DOSE REDUCTION: This exam was performed according to the departmental dose-optimization program which includes automated exposure control, adjustment of the mA and/or kV according to patient size and/or use of iterative reconstruction technique. CONTRAST:  75mL OMNIPAQUE  IOHEXOL  350 MG/ML SOLN COMPARISON:  Same day CTA chest and CTA chest dated 02/12/2024. PET CT 02/01/2024. FINDINGS: Pharynx and larynx: The nasopharynx is symmetric. Slight asymmetry of the left palatine tonsil. The oral cavity and floor of mouth are unremarkable. Normal appearance of the base of tongue. Normal appearance of the epiglottis. Aryepiglottic folds  are symmetric. Vocal folds are symmetric. Salivary glands: No inflammation, mass, or stone. Thyroid: Normal. Lymph nodes: There is a lymph node conglomerate in right cervical level IV and involving the right supraclavicular fossa which measures 2.6 x 2.0 cm which appears similar to the prior PET CT. There are additional enlarged lymph nodes extending inferior to this nodal conglomerate which abuts the right brachiocephalic vein measuring up to 1.7 cm with central hypoattenuation concerning for central necrosis (series 2, image 104). Vascular: There is a filling defect within the right external jugular vein noted extending superiorly to the level of the angle of the mandible (series 2, image 53). The filling defect extends inferiorly  within the external jugular vein with eventual non opacification of the vessel. There is significant distension of the external jugular vein within the right supraclavicular fossa noted on series 2, image 85). There is associated edema and stranding within the right neck along the sternocleidomastoid muscle extending into the posterior cervical region and supraclavicular fossa. There is likely narrowing of the right brachiocephalic vein due to enlarged lower cervical/supraclavicular lymph nodes. Mild atherosclerosis of the carotid bifurcations without high-grade stenosis appreciated. Limited intracranial: Negative. Visualized orbits: Negative. Mastoids and visualized paranasal sinuses: Air-fluid level in the left maxillary sinus. Mastoid air cells are clear. Skeleton: No acute or aggressive finding. Degenerative changes in the visualized cervical spine. Upper chest: Thoracic findings better evaluated on same day CTA chest. There is redemonstration of right pleural effusion and necrotic right upper lobe mass as well as abnormal appearing superior mediastinal lymph nodes. Emphysema noted. Other: None. IMPRESSION: There is thrombosis of the right external jugular vein extending from the level of the angle of the mandible inferiorly into the supraclavicular fossa. The vessel is significantly distended within the supraclavicular fossa with surrounding edema and stranding. Centrally necrotic lymph nodes in cervical level IV/supraclavicular fossa corresponding to findings on recent PET CT. Centrally necrotic nodal conglomerate abuts the right brachiocephalic vein and likely results in narrowing of the vessel. Mild asymmetric prominence of the left palatine tonsil. Recommend correlation with direct visualization. Thoracic findings better evaluated on same day CTA chest. Air-fluid level in the left maxillary sinus. Recommend correlation for acute sinusitis. Electronically Signed: By: Denny Flack M.D. On: 03/15/2024 14:56    CT Angio Chest PE W and/or Wo Contrast Result Date: 03/15/2024 CLINICAL DATA:  Neck swelling and mass. Lung cancer. Concern for pulmonary embolism. EXAM: CT ANGIOGRAPHY CHEST WITH CONTRAST TECHNIQUE: Multidetector CT imaging of the chest was performed using the standard protocol during bolus administration of intravenous contrast. Multiplanar CT image reconstructions and MIPs were obtained to evaluate the vascular anatomy. RADIATION DOSE REDUCTION: This exam was performed according to the departmental dose-optimization program which includes automated exposure control, adjustment of the mA and/or kV according to patient size and/or use of iterative reconstruction technique. CONTRAST:  75mL OMNIPAQUE  IOHEXOL  350 MG/ML SOLN COMPARISON:  CT dated 02/12/2024. FINDINGS: Evaluation of this exam is limited due to respiratory motion as streak artifact caused by patient's arms. Evaluation is also limited due to streak artifact caused by dense contrast and overlying support wires. Cardiovascular: There is no cardiomegaly or pericardial effusion. Mild atherosclerotic calcification of the thoracic aorta. No aneurysmal dilatation or dissection. The the origins of the great vessels of the aortic arch appear patent. Evaluation of the pulmonary arteries is limited due to factors above. There are however small bilateral subsegmental pulmonary artery emboli which may be residual from prior PE. Recurrent acute PE is not excluded. Overall decrease  in the clot burden since the prior CT. No evidence of right heart straining. Mediastinum/Nodes: Right hilar mass/adenopathy measures 3.8 x 3.7 cm inferiorly and 3.4 x 3.3 cm superiorly. Left hilar mass/adenopathy measures 2.5 x 2.5 cm, increased since the prior CT. The esophagus is grossly unremarkable no mediastinal fluid collection. Right neck mass/adenopathy with overall interval increase in the size of the supraclavicular lymph nodes compared to prior CT. There is diffuse skin edema  and soft tissue swelling of the right side of the neck, new since the prior CT. Lungs/Pleura: Right pleural effusion, increased in size since the prior CT. An area of consolidative change extending from the right hilum into the right upper lobe relatively similar. There is background of emphysema. There is a 13 mm left lower lobe nodule increased in size since the prior CT. No pneumothorax. The central airways are patent. Upper Abdomen: No acute abnormality. Musculoskeletal: No acute osseous pathology. Review of the MIP images confirms the above findings. IMPRESSION: 1. Small bilateral subsegmental pulmonary artery emboli, likely residual from prior PE. Recurrent acute PE is not excluded. Overall decrease in the clot burden since the prior CT. No evidence of right heart straining. 2. Right pleural effusion, increased in size since the prior CT. 3. Right neck mass/adenopathy with overall interval increase in the size of the supraclavicular lymph nodes compared to prior CT. There is diffuse skin edema and soft tissue swelling of the right neck, new since the prior CT. 4. Bilateral hilar masses/adenopathy, increased in size since the prior CT. 5. A 13 mm left lower lobe nodule, increased in size since the prior CT. 6. Aortic Atherosclerosis (ICD10-I70.0) and Emphysema (ICD10-J43.9). 03/15/2024 These results were called by telephone at the time of interpretation on 03/15/2024 at 2:29 pm to provider JULIE HAVILAND , who verbally acknowledged these results. Electronically Signed   By: Angus Bark M.D.   On: 03/15/2024 14:47        Scheduled Meds:  sodium chloride    Intravenous Once   acetaminophen   1,000 mg Oral Q8H   benzonatate   200 mg Oral TID   feeding supplement  237 mL Oral BID BM   fenofibrate  160 mg Oral Daily   pantoprazole   40 mg Oral Daily   polyethylene glycol  17 g Oral Daily   rosuvastatin   10 mg Oral Daily   senna-docusate  2 tablet Oral QHS   sucralfate   1 g Oral TID WC & HS    Continuous Infusions:  sodium chloride  40 mL/hr at 03/16/24 0557   heparin  1,250 Units/hr (03/16/24 0659)          Audria Leather, MD [facility] 03/16/2024, 10:09 AM

## 2024-03-16 NOTE — Progress Notes (Signed)
 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  Patient began to experience lumps on the right side of his neck which he noted increased in size and became painful, therefore came to the cancer center for evaluation.  Subsequently admitted and diagnosed with thrombosis.  Follows closely with Dr. Marguerita Shih.  Right external jugular vein thrombosis History of PE Right pleural effusion Pain, right neck mass -Patient has been on Eliquis  at home and reports compliance - CT angio chest done 03/15/2024 shows small bilateral subsegmental pulmonary artery emboli, may be residual from prior PE. 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 - CT chest 6/11 shows right neck mass/adenopathy with interval increase in size of supraclavicular lymph nodes. - Recommend radiation oncology evaluation requested today. - Medical oncology/Dr. Marguerita Shih following and will make further evaluation and treatment recommendations.  Anemia, normocytic - Likely multifactorial due to malignancy and oncology therapies - Hemoglobin 6.8.  Recommend PRBC transfusion today. - Transfuse PRBC for hemoglobin <7.0 - Iron studies done, no repletion at this time - Continue to monitor CBC with differential closely   Code Status Full  Subjective:  Patient seen awake and alert sitting up in bed.  On supplemental O2 via [place].  Reports pain and sensitivity of right neck mass.  Wife at bedside who states patient's compliance with Eliquis  at home, reiterating that she gave it to him twice a day.  No other complaints  offered.  Objective:   Intake/Output Summary (Last 24 hours) at 03/16/2024 1046 Last data filed at 03/16/2024 9811 Gross per 24 hour  Intake 1054.58 ml  Output 645 ml  Net 409.58 ml     PHYSICAL EXAMINATION: ECOG PERFORMANCE STATUS: 2 - Symptomatic, <50% confined to bed  Vitals:   03/16/24 0355 03/16/24 0742  BP: (!) 112/55 (!) 105/59  Pulse: (!) 106 (!) 110  Resp: (!) 22 20  Temp: 98.3 F (36.8 C) 99.2 F (37.3 C)  SpO2: 99% 96%   Filed Weights   03/15/24 2213  Weight: 117 lb 1 oz (53.1 kg)    GENERAL: alert, no distress and comfortable SKIN: skin color, texture, turgor are normal, no rashes or significant lesions EYES: normal, conjunctiva are pink and non-injected, sclera clear OROPHARYNX: no exudate, no erythema and lips, buccal mucosa, and tongue normal  NECK: supple, thyroid normal size, non-tender, without nodularity LYMPH: no palpable lymphadenopathy in the cervical, axillary or inguinal LUNGS: clear to auscultation and percussion with normal breathing effort HEART: regular rate & rhythm and no murmurs and no lower extremity edema ABDOMEN: abdomen soft, non-tender and normal bowel sounds MUSCULOSKELETAL: no cyanosis of digits and no clubbing  PSYCH: alert & oriented x 3 with fluent speech NEURO: no focal motor/sensory deficits   All questions were answered. The patient knows to call the clinic with any problems, questions or concerns.   The total time spent in the appointment was 40 minutes encounter with patient including review of chart and various tests results, discussions about plan of care and coordination of care plan  Jacqualin Mate, NP 03/16/2024  10:46 AM    Labs Reviewed:  Lab Results  Component Value Date   WBC 8.0 03/16/2024   HGB 6.8 (LL) 03/16/2024   HCT 22.9 (L) 03/16/2024   MCV 82.1 03/16/2024   PLT 326 03/16/2024   Recent Labs    03/13/24 1459 03/14/24 1126 03/15/24 1234 03/16/24 0449  NA 135 135 133* 129*  K 3.6 4.1 2.9* 4.5   CL 98 98 98 95*  CO2 26 28 25 24   GLUCOSE 137* 98 122* 104*  BUN 13 19 16 13   CREATININE 0.71 0.74 0.93 0.68  CALCIUM  9.3 9.0 8.6* 8.2*  GFRNONAA >60 >60 >60 >60  PROT 7.5 7.1  --  6.0*  ALBUMIN 3.3* 3.1*  --  1.9*  AST 16 21  --  31  ALT 18 18  --  22  ALKPHOS 108 99  --  89  BILITOT 0.3 0.3  --  0.2    Studies Reviewed:  CT Soft Tissue Neck W Contrast Addendum Date: 03/15/2024 ADDENDUM REPORT: 03/15/2024 15:04 ADDENDUM: These results were called by telephone at the time of interpretation on 03/15/2024 at 3:04 pm to provider JULIE HAVILAND , who verbally acknowledged these results. Electronically Signed   By: Denny Flack M.D.   On: 03/15/2024 15:04   Result Date: 03/15/2024 CLINICAL DATA:  Neck mass, nonpulsatile.  Neck swelling. EXAM: CT NECK WITH CONTRAST TECHNIQUE: Multidetector CT imaging of the neck was performed using the standard protocol following the bolus administration of intravenous contrast. RADIATION DOSE REDUCTION: This exam was performed according to the departmental dose-optimization program which includes automated exposure control, adjustment of the mA and/or kV according to patient size and/or use of iterative reconstruction technique. CONTRAST:  75mL OMNIPAQUE  IOHEXOL  350 MG/ML SOLN COMPARISON:  Same day CTA chest and CTA chest dated 02/12/2024. PET CT 02/01/2024. FINDINGS: Pharynx and larynx: The nasopharynx is symmetric. Slight asymmetry of the left palatine tonsil. The oral cavity and floor of mouth are unremarkable. Normal appearance of the base of tongue. Normal appearance of the epiglottis. Aryepiglottic folds are symmetric. Vocal folds are symmetric. Salivary glands: No inflammation, mass, or stone. Thyroid: Normal. Lymph nodes: There is a lymph node conglomerate in right cervical level IV and involving the right supraclavicular fossa which measures 2.6 x 2.0 cm which appears similar to the prior PET CT. There are additional enlarged lymph nodes extending inferior  to this nodal conglomerate which abuts the right brachiocephalic vein measuring up to 1.7 cm with central hypoattenuation concerning for central necrosis (series 2, image 104). Vascular: There is a filling defect within the right external jugular vein noted extending superiorly to the level of the angle of the mandible (series 2, image 53). The filling defect extends inferiorly within the external jugular vein with eventual non opacification of the vessel. There is significant distension of the external jugular vein within the right supraclavicular fossa noted on series 2, image 85). There is associated edema and stranding within the right neck along the sternocleidomastoid muscle extending into the posterior cervical region and supraclavicular fossa. There is likely narrowing of the right brachiocephalic vein due to enlarged lower cervical/supraclavicular lymph nodes. Mild atherosclerosis of the carotid bifurcations without high-grade stenosis appreciated. Limited intracranial: Negative. Visualized orbits: Negative. Mastoids and visualized paranasal sinuses: Air-fluid level in the left maxillary sinus. Mastoid air cells are clear. Skeleton: No acute or aggressive finding. Degenerative changes in the visualized cervical spine. Upper chest: Thoracic findings better evaluated on same day CTA chest. There is redemonstration of  right pleural effusion and necrotic right upper lobe mass as well as abnormal appearing superior mediastinal lymph nodes. Emphysema noted. Other: None. IMPRESSION: There is thrombosis of the right external jugular vein extending from the level of the angle of the mandible inferiorly into the supraclavicular fossa. The vessel is significantly distended within the supraclavicular fossa with surrounding edema and stranding. Centrally necrotic lymph nodes in cervical level IV/supraclavicular fossa corresponding to findings on recent PET CT. Centrally necrotic nodal conglomerate abuts the right  brachiocephalic vein and likely results in narrowing of the vessel. Mild asymmetric prominence of the left palatine tonsil. Recommend correlation with direct visualization. Thoracic findings better evaluated on same day CTA chest. Air-fluid level in the left maxillary sinus. Recommend correlation for acute sinusitis. Electronically Signed: By: Denny Flack M.D. On: 03/15/2024 14:56   CT Angio Chest PE W and/or Wo Contrast Result Date: 03/15/2024 CLINICAL DATA:  Neck swelling and mass. Lung cancer. Concern for pulmonary embolism. EXAM: CT ANGIOGRAPHY CHEST WITH CONTRAST TECHNIQUE: Multidetector CT imaging of the chest was performed using the standard protocol during bolus administration of intravenous contrast. Multiplanar CT image reconstructions and MIPs were obtained to evaluate the vascular anatomy. RADIATION DOSE REDUCTION: This exam was performed according to the departmental dose-optimization program which includes automated exposure control, adjustment of the mA and/or kV according to patient size and/or use of iterative reconstruction technique. CONTRAST:  75mL OMNIPAQUE  IOHEXOL  350 MG/ML SOLN COMPARISON:  CT dated 02/12/2024. FINDINGS: Evaluation of this exam is limited due to respiratory motion as streak artifact caused by patient's arms. Evaluation is also limited due to streak artifact caused by dense contrast and overlying support wires. Cardiovascular: There is no cardiomegaly or pericardial effusion. Mild atherosclerotic calcification of the thoracic aorta. No aneurysmal dilatation or dissection. The the origins of the great vessels of the aortic arch appear patent. Evaluation of the pulmonary arteries is limited due to factors above. There are however small bilateral subsegmental pulmonary artery emboli which may be residual from prior PE. Recurrent acute PE is not excluded. Overall decrease in the clot burden since the prior CT. No evidence of right heart straining. Mediastinum/Nodes: Right  hilar mass/adenopathy measures 3.8 x 3.7 cm inferiorly and 3.4 x 3.3 cm superiorly. Left hilar mass/adenopathy measures 2.5 x 2.5 cm, increased since the prior CT. The esophagus is grossly unremarkable no mediastinal fluid collection. Right neck mass/adenopathy with overall interval increase in the size of the supraclavicular lymph nodes compared to prior CT. There is diffuse skin edema and soft tissue swelling of the right side of the neck, new since the prior CT. Lungs/Pleura: Right pleural effusion, increased in size since the prior CT. An area of consolidative change extending from the right hilum into the right upper lobe relatively similar. There is background of emphysema. There is a 13 mm left lower lobe nodule increased in size since the prior CT. No pneumothorax. The central airways are patent. Upper Abdomen: No acute abnormality. Musculoskeletal: No acute osseous pathology. Review of the MIP images confirms the above findings. IMPRESSION: 1. Small bilateral subsegmental pulmonary artery emboli, likely residual from prior PE. Recurrent acute PE is not excluded. Overall decrease in the clot burden since the prior CT. No evidence of right heart straining. 2. Right pleural effusion, increased in size since the prior CT. 3. Right neck mass/adenopathy with overall interval increase in the size of the supraclavicular lymph nodes compared to prior CT. There is diffuse skin edema and soft tissue swelling of the right neck, new  since the prior CT. 4. Bilateral hilar masses/adenopathy, increased in size since the prior CT. 5. A 13 mm left lower lobe nodule, increased in size since the prior CT. 6. Aortic Atherosclerosis (ICD10-I70.0) and Emphysema (ICD10-J43.9). 03/15/2024 These results were called by telephone at the time of interpretation on 03/15/2024 at 2:29 pm to provider JULIE HAVILAND , who verbally acknowledged these results. Electronically Signed   By: Angus Bark M.D.   On: 03/15/2024 14:47

## 2024-03-17 ENCOUNTER — Ambulatory Visit
Admission: RE | Admit: 2024-03-17 | Discharge: 2024-03-17 | Disposition: A | Discharge status: Home / Self Care | Source: Ambulatory Visit | Attending: Radiation Oncology | Admitting: Radiation Oncology

## 2024-03-17 ENCOUNTER — Other Ambulatory Visit (HOSPITAL_COMMUNITY): Payer: Self-pay

## 2024-03-17 DIAGNOSIS — Z515 Encounter for palliative care: Secondary | ICD-10-CM

## 2024-03-17 DIAGNOSIS — G893 Neoplasm related pain (acute) (chronic): Secondary | ICD-10-CM | POA: Diagnosis not present

## 2024-03-17 DIAGNOSIS — Z510 Encounter for antineoplastic radiation therapy: Secondary | ICD-10-CM | POA: Insufficient documentation

## 2024-03-17 DIAGNOSIS — E43 Unspecified severe protein-calorie malnutrition: Secondary | ICD-10-CM | POA: Insufficient documentation

## 2024-03-17 DIAGNOSIS — C3411 Malignant neoplasm of upper lobe, right bronchus or lung: Secondary | ICD-10-CM | POA: Insufficient documentation

## 2024-03-17 DIAGNOSIS — Z87891 Personal history of nicotine dependence: Secondary | ICD-10-CM | POA: Insufficient documentation

## 2024-03-17 DIAGNOSIS — Z7189 Other specified counseling: Secondary | ICD-10-CM | POA: Diagnosis not present

## 2024-03-17 DIAGNOSIS — I82890 Acute embolism and thrombosis of other specified veins: Secondary | ICD-10-CM

## 2024-03-17 LAB — COMPREHENSIVE METABOLIC PANEL WITH GFR
ALT: 30 U/L (ref 0–44)
AST: 36 U/L (ref 15–41)
Albumin: 1.9 g/dL — ABNORMAL LOW (ref 3.5–5.0)
Alkaline Phosphatase: 98 U/L (ref 38–126)
Anion gap: 11 (ref 5–15)
BUN: 13 mg/dL (ref 8–23)
CO2: 24 mmol/L (ref 22–32)
Calcium: 8.3 mg/dL — ABNORMAL LOW (ref 8.9–10.3)
Chloride: 97 mmol/L — ABNORMAL LOW (ref 98–111)
Creatinine, Ser: 0.78 mg/dL (ref 0.61–1.24)
GFR, Estimated: >60 mL/min (ref >60)
Glucose, Bld: 100 mg/dL — ABNORMAL HIGH (ref 70–99)
Potassium: 4.1 mmol/L (ref 3.5–5.1)
Sodium: 132 mmol/L — ABNORMAL LOW (ref 135–145)
Total Bilirubin: 0.3 mg/dL (ref 0.0–1.2)
Total Protein: 5.8 g/dL — ABNORMAL LOW (ref 6.5–8.1)

## 2024-03-17 LAB — CBC
HCT: 25.1 % — ABNORMAL LOW (ref 39.0–52.0)
Hemoglobin: 7.7 g/dL — ABNORMAL LOW (ref 13.0–17.0)
MCH: 25.5 pg — ABNORMAL LOW (ref 26.0–34.0)
MCHC: 30.7 g/dL (ref 30.0–36.0)
MCV: 83.1 fL (ref 80.0–100.0)
Platelets: 365 10*3/uL (ref 150–400)
RBC: 3.02 MIL/uL — ABNORMAL LOW (ref 4.22–5.81)
RDW: 16.5 % — ABNORMAL HIGH (ref 11.5–15.5)
WBC: 8.7 10*3/uL (ref 4.0–10.5)
nRBC: 0 % (ref 0.0–0.2)

## 2024-03-17 LAB — BPAM RBC
Blood Product Expiration Date: 202507062359
ISSUE DATE / TIME: 202506121256
Unit Type and Rh: 5100

## 2024-03-17 LAB — HEPARIN LEVEL (UNFRACTIONATED): Heparin Unfractionated: 0.49 [IU]/mL (ref 0.30–0.70)

## 2024-03-17 LAB — TYPE AND SCREEN
ABO/RH(D): A POS
Antibody Screen: POSITIVE
Donor AG Type: NEGATIVE
Unit division: 0

## 2024-03-17 LAB — APTT
aPTT: 69 s — ABNORMAL HIGH (ref 24–36)
aPTT: 63 s — ABNORMAL HIGH (ref 24–36)

## 2024-03-17 LAB — MAGNESIUM: Magnesium: 1.8 mg/dL (ref 1.7–2.4)

## 2024-03-17 MED ORDER — BISACODYL 10 MG RE SUPP: 10.0000 mg | Freq: Every day | RECTAL | Status: DC | PRN

## 2024-03-17 MED ORDER — ONDANSETRON HCL 4 MG PO TABS: 8.0000 mg | ORAL_TABLET | Freq: Four times a day (QID) | ORAL | Status: DC | PRN

## 2024-03-17 MED ORDER — SODIUM CHLORIDE 0.9 % IV SOLN: 8.0000 mg | Freq: Four times a day (QID) | INTRAVENOUS | Status: DC | PRN

## 2024-03-17 MED ORDER — ENOXAPARIN SODIUM 60 MG/0.6ML IJ SOSY
1.0000 mg/kg | PREFILLED_SYRINGE | Freq: Two times a day (BID) | INTRAMUSCULAR | Status: DC
  Administered 2024-03-17 – 2024-03-18 (×3): 55 mg via SUBCUTANEOUS
  Filled 2024-03-17 (×3): qty 0.6

## 2024-03-17 MED ORDER — PHENOL 1.4 % MT LIQD
1.0000 | OROMUCOSAL | Status: DC | PRN
  Administered 2024-03-17 (×2): 1 via OROMUCOSAL
  Filled 2024-03-17: qty 177

## 2024-03-17 MED ORDER — MINERAL OIL RE ENEM: 1.0000 | ENEMA | Freq: Once | RECTAL | Status: DC | PRN

## 2024-03-17 MED ORDER — GUAIFENESIN-CODEINE 100-10 MG/5ML PO SOLN
10.0000 mL | Freq: Four times a day (QID) | ORAL | Status: DC | PRN
  Administered 2024-03-17 – 2024-03-18 (×3): 10 mL via ORAL
  Filled 2024-03-17 (×3): qty 10

## 2024-03-17 NOTE — Progress Notes (Signed)
 PHARMACY - ANTICOAGULATION CONSULT NOTE  Pharmacy Consult for heparin  (apixaban  on hold) Indication: hx PE, right external jugular vein thrombosis  Allergies  Allergen Reactions   Breyna  [Budesonide -Formoterol  Fumarate] Cough    Worsened existing cough    Patient Measurements: Height: 5' 7 (170.2 cm) Weight: 53.1 kg (117 lb 1 oz) IBW/kg (Calculated) : 66.1 HEPARIN  DW (KG): 53.1  Vital Signs: Temp: 100.2 F (37.9 C) (06/12 2244) Temp Source: Oral (06/12 2244) BP: 118/66 (06/12 2244) Pulse Rate: 110 (06/12 2244)  Labs: Recent Labs    03/14/24 1126 03/14/24 1126 03/15/24 1234 03/15/24 1234 03/16/24 0449 03/16/24 1257 03/16/24 1914 03/16/24 2035 03/17/24 0430  HGB 6.6*   < > 7.1*  --  6.8*  --   --  7.7* 7.7*  HCT 21.1*  --  23.4*  --  22.9*  --   --  25.1* 25.1*  PLT 435*  --  396  --  326  --   --   --  365  APTT  --   --   --    < > 62* 82* 55*  --  69*  LABPROT  --   --   --   --  18.4*  --   --   --   --   INR  --   --   --   --  1.5*  --   --   --   --   HEPARINUNFRC  --   --   --   --  >1.10*  --  0.57  --  0.49  CREATININE 0.74  --  0.93  --  0.68  --   --   --   --    < > = values in this interval not displayed.    Estimated Creatinine Clearance: 71.9 mL/min (by C-G formula based on SCr of 0.68 mg/dL).   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 Pt received first dose of chemotherapy (Keytruda ) on 03/13/24. Pt was recently diagnosed with PE in May 2025 for which he is prescribed apixaban . Pt found to have thrombosis of right external jugular vein. Anticoagulation transitioned from apixaban  to  heparin  - pharmacy to dose.   Heparin  level is falsely elevated due to recent DOAC  Today, 03/17/24 aPTT 69 therapeutic on 1400 units/hr Heparin  level 0.49 still not correlating with aPTT CBC: Hgb low, Plt WNL No bleeding reported  Goal of Therapy:  Heparin  level 0.3-0.7 units/ml aPTT 66-102 seconds Monitor platelets by anticoagulation protocol: Yes   Plan:  continue heparin  IV infusion at 1400 units/hr aPTT in 6 hours Daily aPTT, heparin  level,  and CBC Once aPTT and heparin  level correlate, can monitor using heparin  level only Monitor for signs of bleeding    Beau Bound RPh 03/17/2024, 5:43 AM

## 2024-03-17 NOTE — Consult Note (Signed)
 Palliative Care Consult Note                                  Date: 03/17/2024   Patient Name: Jeff Romero  DOB: 1960-10-07  MRN: [id number]  Age / Sex: 63 y.o., male  [facility]P: [name] Referring Physician: Dana Ivan Romero*  Reason for Consultation: Establishing goals of care, Non pain symptom management, and Pain control  HPI/Patient Profile: Palliative Care consult requested for goals of care discussion in this 63 y.o. male  with medical history of stage IV non small cell lung cancer with right upper lobe lung mass and metastatic bone lesions (01/2024), PE,  admitted on 03/15/2024 after being seen at [facility] and experiencing ongoing cough and right neck JVD, swelling. CT angio showed small bilateral pulmonary artery embolus, right pleural effusion.  Past Medical History:  Diagnosis Date   Allergy    Anxiety    Arthritis    Blood transfusion without reported diagnosis    Cataract    Depression    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   Prostate cancer (HCC)    Subjective:   This NP Jeff Romero reviewed medical records, received report from team, assessed the patient and then met at the patient's bedside with Jeff Romero and included his wife via speakerphone to discuss symptom management.    Concept of Palliative Care was introduced as specialized medical care for people and their families living with serious illness.  It focuses on providing relief from the symptoms and stress of a serious illness.  The goal is to improve quality of life for both the patient and the family. Values and goals of care important to patient and family were attempted to be elicited. Patient is well known to myself as I follow him for palliative and symptom management needs at [facility].   We discussed Jeff Romero's symptoms at length. He is reporting ongoing cough that is persistent despite our  interventions.  Patient is currently taking Tessalon  Perles as needed, guaifenesin /codeine  3 times daily as needed, and have DuoNebs available as needed.  He states he has not utilize DuoNebs as often as he probably should.  Education provided on potential etiology of persistent cough which is dry and nagging.  Some mucus production at times.  It is difficult for patient to engage in long conversations without coughing spells.  We discussed his pain at length.  He has been started on OxyContin  10 mg every 12 hours as initially prescribed on yesterday.  He has oxycodone  15 mg every 4 hours as needed for breakthrough pain and hydromorphone  0.5-1 mg IV every 2 hours as needed for severe pain.  Patient verbalized appreciation of new pain regimen stating he can tell a significant difference with the addition of extended release oxycodone .  Jeff Romero reports his pain is in his right neck radiating down his shoulder and upper back, and some chest wall pain.  Pain is described as constant, sharp, nagging, throbbing.  Again patient expressed appreciation of current pain regimen with noticeable differences in pain levels compared to several days prior.  In anticipation for discharge we will ensure patient is able to pick up home prescriptions and continue with the regimen to prevent any gaps in medication regimen.  Patient and wife verbalized understanding and appreciation.  All questions answered and support provided.  I discussed the importance of continued  conversation with family and their medical providers regarding overall plan of care and treatment options, ensuring decisions are within the context of the patients values and GOCs.   Objective:   Primary Diagnoses: Present on Admission:  Bilateral pulmonary embolism (HCC)  Malignant neoplasm of upper lobe of right lung (HCC)  Anemia  Thrombosis   Scheduled Meds:  acetaminophen   1,000 mg Oral Q8H   benzonatate   200 mg Oral TID   enoxaparin  (LOVENOX )  injection  1 mg/kg Subcutaneous BID   feeding supplement  237 mL Oral TID BM   fenofibrate   160 mg Oral Daily   oxyCODONE   10 mg Oral Q12H   pantoprazole   40 mg Oral Daily   polyethylene glycol  17 g Oral Daily   rosuvastatin   10 mg Oral Daily   senna-docusate  1 tablet Oral BID   sucralfate   1 g Oral TID WC & HS    Continuous Infusions:   PRN Meds: bisacodyl , guaiFENesin -codeine , HYDROmorphone  (DILAUDID ) injection, ipratropium-albuterol , ondansetron  **OR** ondansetron  (ZOFRAN ) IV, oxyCODONE , phenol  Allergies  Allergen Reactions   Breyna  [Budesonide -Formoterol  Fumarate] Cough    Worsened existing cough    Review of Systems  Constitutional:  Positive for activity change, appetite change and fatigue.  Musculoskeletal:  Positive for back pain and neck pain.  Unless otherwise noted, a complete review of systems is negative.  Physical Exam General: NAD HEENT: right neck JVD, neck swelling Cardiovascular: regular rate and rhythm Pulmonary: clear ant fields, diminished bilaterally  Abdomen: soft, nontender, + bowel sounds Extremities: no edema, no joint deformities Skin: no rashes, warm and dry Neurological: AAO x 3  Vital Signs:  BP 109/64 (BP Location: Right Arm)   Pulse 100   Temp 97.8 F (36.6 C) (Oral)   Resp 19   Ht 5' 7 (1.702 m)   Wt 54.4 kg   SpO2 93%   BMI 18.78 kg/m  Pain Scale: 0-10   Pain Score: 6   SpO2: SpO2: 93 % O2 Device:SpO2: 93 % O2 Flow Rate: .O2 Flow Rate (L/min): 2 L/min  IO: Intake/output summary:  Intake/Output Summary (Last 24 hours) at 03/17/2024 1551 Last data filed at 03/17/2024 1319 Gross per 24 hour  Intake 1434.17 ml  Output 1200 ml  Net 234.17 ml    LBM: Last BM Date : 03/14/24 Baseline Weight: Weight: 53.1 kg Most recent weight: Weight: 54.4 kg      Palliative Assessment/Data:    Advanced Care Planning:   Primary Decision Maker: PATIENT  Code Status/Advance Care Planning: Full code   Assessment & Plan:    SUMMARY OF RECOMMENDATIONS   Full Code-as confirmed by patient. Continue with current plan of care  Patient is clear and expressed wishes to continue to treat the treatable allow him every opportunity to continue to thrive. PMT will continue to support and follow . Please call team line with urgent needs.  Symptom Management:  Pain OxyContin  10 mg every 12 hours Oxycodone  IR 15 mg every 4 hours as needed for moderate to severe/breakthrough pain Hydromorphone  0.5-1 mg every 2 hours as needed for severe pain not controlled by oral regimen. Nausea Zofran  8 mg every 6 hours as needed Constipation Senna 1 tablet twice daily  Dulcolax suppository as needed Cough Guaifenesin /codeine  10 mL every 6 hours as needed for cough  Palliative Prophylaxis:  Frequent Pain Assessment  Additional Recommendations (Limitations, Scope, Preferences): Full Scope Treatment  Psycho-social/Spiritual:  Desire for further Chaplaincy support: no Additional Recommendations: ongoing goals of care discussions  Prognosis:  Unable to determine  Discharge Planning:  Home with Palliative Services at [facility]     Patient and wife  expressed understanding and was in agreement with this plan.    Time Total: 55 min   Visit consisted of counseling and education dealing with the complex and emotionally intense issues of symptom management and palliative care in the setting of serious and potentially life-threatening illness.  Signed by:  Jeff Borer, AGPCNP-BC [facility]   Phone: [phone] Pager: [phone] Amion: GEANNIE Romero   Thank you for allowing the Palliative Medicine Team to assist in the care of this patient. Please utilize secure chat with additional questions, if there is no response within 30 minutes please call the above phone number. Palliative Medicine Team providers are available by phone from 7am to 5pm daily and can be reached  through the team cell phone.  Should this patient require assistance outside of these hours, please call the patient's attending physician.  *Please note that this is a verbal dictation therefore any spelling or grammatical errors are due to the Dragon [employer] One system interpretation.

## 2024-03-17 NOTE — Hospital Course (Signed)
 Hospital course:  63 year old man with metastatic NSCLCa and known history of PE on Eliquis  presented with worsening neck pain and swelling.  Workup revealed thrombosis of the right EJ amid necrotic lymph nodes.  Patient was started on heparin  drip.  Subjective:  Patient is feeling much better.  Notes that he finds that new medicine very helpful to manage his pain, notes that his pain stays at a reasonable level and he only needs to take occasional breakthrough medication.   Exam:  General: Chronically ill-appearing man lying in bed with attentive wife at bedside Eyes: sclera anicteric, conjuctiva mild injection bilaterally CVS: S1-S2, regular  Respiratory:  decreased air entry bilaterally secondary to decreased inspiratory effort, rales at bases  GI: NABS, soft, NT  LE: Warm and well-perfused Neuro: A/O x 3,  grossly nonfocal.  Psych: patient is logical and coherent, judgement and insight appear normal, mood and affect appropriate to situation.  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 Liam Redhead, plan is to discharge patient on Lovenox Patient has been seen by XRT and plan is to start radiation on Monday Patient requesting to wait till tomorrow to be discharged  Anemia Patient is s/p 1 unit PRBC transfusion yesterday with appropriate increase in hemoglobin from 6.8 to 7.7, stable on repeat.  Hyponatremia Protein calorie malnutrition Thought to be secondary to decreased p.o. intake and some dehydration, patient with increased IVF and NS at 75 cc an hour with improvement in sodium.  Will need to discuss with family whether he will be able to orally hydrate adequately at home.  Metastatic NSCLCa Chronic cancer-related pain Per Dr. Liam Redhead as an outpatient, presently on Keytruda  Pain markedly improved with addition of OxyContin  with as needed oxycodone  for  breakthrough pain     DVT prophylaxis: Lovenox Code Status: Full Family Communication: Wife was at bedside throughout Disposition: Home

## 2024-03-17 NOTE — Progress Notes (Signed)
  Radiation Oncology         [phone]) [phone] ________________________________  Name: Jeff Romero MRN: [id number]  Date of Service: 03/20/2024  DOB: 07-Apr-1961  Post Treatment Telephone Note  Diagnosis:  Stage IV, [id number], NSCLC, NOS of the RUL (as documented in provider EOT note)  The patient was available for call today.   Symptoms of fatigue have improved since completing therapy.  Symptoms of skin changes have improved since completing therapy.  Symptoms of esophagitis have improved since completing therapy.  The patient has scheduled follow up with his medical oncologist Dr. Marguerita Shih for ongoing care, and was encouraged to call if he  develops concerns or questions regarding radiation.  This concludes the interaction.  Avery Bodo, LPN

## 2024-03-17 NOTE — Progress Notes (Signed)
 PROGRESS NOTE    Jeff Romero  BJY:[id number] DOB: September 24, 1961 DOA: 03/15/2024 PCP: [facility] course:  63 year old man with metastatic NSCLCa and known history of PE on Eliquis  presented with worsening neck pain and swelling.  Workup revealed thrombosis of the right EJ amid necrotic lymph nodes.  Patient was started on heparin  drip.  Subjective:  Patient is feeling much better.  Notes that he finds that new medicine very helpful to manage his pain, notes that his pain stays at a reasonable level and he only needs to take occasional breakthrough medication.   Exam:  General: Chronically ill-appearing man lying in bed with attentive wife at bedside Eyes: sclera anicteric, conjuctiva mild injection bilaterally CVS: S1-S2, regular  Respiratory:  decreased air entry bilaterally secondary to decreased inspiratory effort, rales at bases  GI: NABS, soft, NT  LE: Warm and well-perfused Neuro: A/O x 3,  grossly nonfocal.  Psych: patient is logical and coherent, judgement and insight appear normal, mood and affect appropriate to situation.  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 Liam Redhead, plan is to discharge patient on Lovenox Patient has been seen by XRT and plan is to start radiation on Monday Patient requesting to wait till tomorrow to be discharged  Anemia Patient is s/p 1 unit PRBC transfusion yesterday with appropriate increase in hemoglobin from 6.8 to 7.7, stable on repeat.  Hyponatremia Protein calorie malnutrition Thought to be secondary to decreased p.o. intake and some dehydration, patient with increased IVF and NS at 75 cc an hour with improvement in sodium.  Will need to discuss with family whether he will be able to orally hydrate adequately at home.  Metastatic NSCLCa Chronic cancer-related pain Per Dr. Liam Redhead as an  outpatient, presently on Keytruda  Pain markedly improved with addition of OxyContin  with as needed oxycodone  for breakthrough pain     DVT prophylaxis: Lovenox Code Status: Full Family Communication: Wife was at bedside throughout Disposition: Home        Pressure Injury 03/15/24 Buttocks Left Stage 2 -  Partial thickness loss of dermis presenting as a shallow open injury with a red, pink wound bed without slough. Dry, Pink (Active)  03/15/24 2100  Location: Buttocks  Location Orientation: Left  Staging: Stage 2 -  Partial thickness loss of dermis presenting as a shallow open injury with a red, pink wound bed without slough.  Wound Description (Comments): Dry, Pink  DO NOT USE:  Present on Admission: Yes     Diet Orders (From admission, onward)     Start     Ordered   03/16/24 0904  Diet regular Room service appropriate? Yes; Fluid consistency: Thin  Diet effective now       Question Answer Comment  Room service appropriate? Yes   Fluid consistency: Thin      03/16/24 0903            Objective: Vitals:   03/16/24 2244 03/17/24 0500 03/17/24 0618 03/17/24 1123  BP: 118/66  118/77 109/64  Pulse: (!) 110  (!) 101 100  Resp:   18 19  Temp: 100.2 F (37.9 C)  98.4 F (36.9 C) 97.8 F (36.6 C)  TempSrc: Oral  Oral Oral  SpO2: 100%  96% 93%  Weight:  54.4 kg    Height:        Intake/Output Summary (Last 24 hours) at 03/17/2024 1625  Last data filed at 03/17/2024 1319 Gross per 24 hour  Intake 1080 ml  Output 1200 ml  Net -120 ml   Filed Weights   03/15/24 2213 03/17/24 0500  Weight: 53.1 kg 54.4 kg    Scheduled Meds:  acetaminophen   1,000 mg Oral Q8H   benzonatate   200 mg Oral TID   enoxaparin (LOVENOX) injection  1 mg/kg Subcutaneous BID   feeding supplement  237 mL Oral TID BM   fenofibrate   160 mg Oral Daily   oxyCODONE   10 mg Oral Q12H   pantoprazole   40 mg Oral Daily   polyethylene glycol  17 g Oral Daily   rosuvastatin   10 mg Oral Daily    senna-docusate  1 tablet Oral BID   sucralfate   1 g Oral TID WC & HS   Continuous Infusions:  Nutritional status Signs/Symptoms: severe fat depletion, severe muscle depletion Interventions: Ensure Enlive (each supplement provides 350kcal and 20 grams of protein) Body mass index is 18.78 kg/m.  Data Reviewed:   CBC: Recent Labs  Lab 03/13/24 1459 03/14/24 1126 03/15/24 1234 03/16/24 0449 03/16/24 2035 03/17/24 0430  WBC 9.7 8.7 9.3 8.0  --  8.7  NEUTROABS 8.5* 7.5 7.8*  --   --   --   HGB 7.0* 6.6* 7.1* 6.8* 7.7* 7.7*  HCT 23.3* 21.1* 23.4* 22.9* 25.1* 25.1*  MCV 76.1* 75.9* 80.4 82.1  --  83.1  PLT 485* 435* 396 326  --  365   Basic Metabolic Panel: Recent Labs  Lab 03/13/24 1459 03/14/24 1126 03/15/24 1234 03/16/24 0449 03/17/24 0430  NA 135 135 133* 129* 132*  K 3.6 4.1 2.9* 4.5 4.1  CL 98 98 98 95* 97*  CO2 26 28 25 24 24   GLUCOSE 137* 98 122* 104* 100*  BUN 13 19 16 13 13   CREATININE 0.71 0.74 0.93 0.68 0.78  CALCIUM  9.3 9.0 8.6* 8.2* 8.3*  MG  --   --  1.7  --  1.8   GFR: Estimated Creatinine Clearance: 73.7 mL/min (by C-G formula based on SCr of 0.78 mg/dL). Liver Function Tests: Recent Labs  Lab 03/13/24 1459 03/14/24 1126 03/16/24 0449 03/17/24 0430  AST 16 21 31  36  ALT 18 18 22 30   ALKPHOS 108 99 89 98  BILITOT 0.3 0.3 0.2 0.3  PROT 7.5 7.1 6.0* 5.8*  ALBUMIN 3.3* 3.1* 1.9* 1.9*   No results for input(s): LIPASE, AMYLASE in the last 168 hours. No results for input(s): AMMONIA in the last 168 hours. Coagulation Profile: Recent Labs  Lab 03/16/24 0449  INR 1.5*   Cardiac Enzymes: No results for input(s): CKTOTAL, CKMB, CKMBINDEX, TROPONINI in the last 168 hours. BNP (last 3 results) No results for input(s): PROBNP in the last 8760 hours. HbA1C: No results for input(s): HGBA1C in the last 72 hours. CBG: No results for input(s): GLUCAP in the last 168 hours. Lipid Profile: No results for input(s): CHOL, HDL,  LDLCALC, TRIG, CHOLHDL, LDLDIRECT in the last 72 hours. Thyroid Function Tests: No results for input(s): TSH, T4TOTAL, FREET4, T3FREE, THYROIDAB in the last 72 hours. Anemia Panel: No results for input(s): VITAMINB12, FOLATE, FERRITIN, TIBC, IRON, RETICCTPCT in the last 72 hours. Sepsis Labs: No results for input(s): PROCALCITON, LATICACIDVEN in the last 168 hours.  No results found for this or any previous visit (from the past 240 hours).       Radiology Studies: No results found.         LOS: 2 days  Time spent= 35 mins    Magdalene School, MD [facility]  If 7PM-7AM, please contact night-coverage  03/17/2024, 4:25 PM

## 2024-03-18 ENCOUNTER — Other Ambulatory Visit (HOSPITAL_COMMUNITY): Payer: Self-pay

## 2024-03-18 ENCOUNTER — Encounter: Payer: Self-pay | Admitting: Radiation Oncology

## 2024-03-18 DIAGNOSIS — I82890 Acute embolism and thrombosis of other specified veins: Secondary | ICD-10-CM | POA: Diagnosis not present

## 2024-03-18 DIAGNOSIS — Z515 Encounter for palliative care: Secondary | ICD-10-CM | POA: Diagnosis not present

## 2024-03-18 DIAGNOSIS — R052 Subacute cough: Secondary | ICD-10-CM

## 2024-03-18 DIAGNOSIS — C3491 Malignant neoplasm of unspecified part of right bronchus or lung: Secondary | ICD-10-CM | POA: Diagnosis not present

## 2024-03-18 DIAGNOSIS — G893 Neoplasm related pain (acute) (chronic): Secondary | ICD-10-CM | POA: Diagnosis not present

## 2024-03-18 LAB — CBC
HCT: 26.9 % — ABNORMAL LOW (ref 39.0–52.0)
Hemoglobin: 8.1 g/dL — ABNORMAL LOW (ref 13.0–17.0)
MCH: 25.1 pg — ABNORMAL LOW (ref 26.0–34.0)
MCHC: 30.1 g/dL (ref 30.0–36.0)
MCV: 83.3 fL (ref 80.0–100.0)
Platelets: 382 10*3/uL (ref 150–400)
RBC: 3.23 MIL/uL — ABNORMAL LOW (ref 4.22–5.81)
RDW: 16.7 % — ABNORMAL HIGH (ref 11.5–15.5)
WBC: 8.9 10*3/uL (ref 4.0–10.5)
nRBC: 0 % (ref 0.0–0.2)

## 2024-03-18 MED ORDER — ENOXAPARIN SODIUM 60 MG/0.6ML IJ SOSY
60.0000 mg | PREFILLED_SYRINGE | Freq: Two times a day (BID) | INTRAMUSCULAR | 0 refills | Status: DC
  Filled 2024-03-18: qty 36, 30d supply, fill #0

## 2024-03-18 NOTE — Progress Notes (Addendum)
 Patient's family called the on call nurse asking for clarification on discharge medications. The discharge instructions were not yet signed, so I reviewed the rest of the chart to the best of my ability to answer their questions on how to dose guaifenesin  with codeine , and iron ...  I instructed the nurse to tell the patient/family that he should take as follows:  Ferrous Sulfate - if he has this at home, take 325 mg with breakfast.  guaifenesin  with codeine  100-10mg /3mL  - take 10mL q 6 hrs prn cough.   Afterwards, I was able to touch base with Dr. Leann Prom, hospitalist, about these recommendations. She confirmed this was correct.  -----------------------------------  Jeff Dawes, MD

## 2024-03-18 NOTE — Progress Notes (Signed)
 Mobility Specialist - Progress Note   03/18/24 0846  Mobility  Activity Ambulated independently in hallway  Level of Assistance Independent  Assistive Device None  Distance Ambulated (ft) 500 ft  Activity Response Tolerated well  Mobility Referral Yes  Mobility visit 1 Mobility  Mobility Specialist Start Time (ACUTE ONLY) 0834  Mobility Specialist Stop Time (ACUTE ONLY) 0844  Mobility Specialist Time Calculation (min) (ACUTE ONLY) 10 min   Pt received in bed and agreeable to mobility. No complaints during session. Pt to bed after session with all needs met. Bed alarm on.     [facility]

## 2024-03-18 NOTE — Discharge Summary (Signed)
 Jeff Romero EAV:[id number] DOB: March 09, 1961 DOA: 03/15/2024  PCP: [name]  Admit date: 03/15/2024  Discharge date: 03/18/2024  Admitted From: Home   disposition: Home  Recommendations for Outpatient Follow-up:   Follow up with radiation oncology as scheduled on Monday as well as medical oncology as previously scheduled.   Home Health: Palliative care Equipment/Devices: None Consultations: Radiation oncology, medical oncology, palliative care Discharge Condition: Fair  Chief Complaint  Patient presents with   neck swelling     Brief history of present illness from the day of admission and additional interim summary    63 y.o. 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  On presentation in the ED, CT soft tissue neck with contrast showed thrombosis of the right external jugular vein and necrotic lymph nodes.  ED provider discussed with oncology who recommended to start heparin  drip.                                                                    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 Liam Redhead, plan is to discharge patient on Lovenox Patient has been seen by XRT and plan is to start radiation on Monday   Anemia Patient is s/p 1 unit PRBC transfusion yesterday with appropriate increase in hemoglobin from 6.8 to 7.7, stable on repeat.   Hyponatremia Protein calorie malnutrition Thought  to be secondary to decreased p.o. intake and some dehydration, patient with increased IVF and NS at 75 cc an hour with improvement in sodium.  Will need to discuss with family whether he will be able to orally hydrate adequately at home.   Metastatic NSCLCa Chronic cancer-related pain Per Dr. Liam Redhead as an outpatient, presently on Keytruda  Pain markedly improved with addition of OxyContin  with as needed oxycodone  for breakthrough pain.  Patient being followed by palliative care at home and through medical oncology clinic.         Discharge diagnosis     Principal Problem:   Acute external jugular vein thrombosis Active Problems:   Bilateral pulmonary embolism (HCC)   Malignant neoplasm of upper lobe of right lung (HCC)   Anemia   Thrombosis   Non-small cell cancer of right lung (HCC)   Cancer-related breakthrough pain   Protein-calorie malnutrition, severe    Discharge instructions    Discharge Instructions     Diet - low sodium heart healthy   Complete by: As directed  Discharge instructions   Complete by: As directed    As we discussed, increase your protein intake, try to eat protein at each meal. You can also use supplements like Ensure between meals. Follow up with your oncologist and palliative care as directed by them.   Discharge wound care:   Complete by: As directed    It is important to relieve pressure off of the area that are getting injured, so try to walk or sit in positions where you are not resting your weight not it. Also, increasing protein intake will be important.   Increase activity slowly   Complete by: As directed        Discharge Medications   Allergies as of 03/18/2024       Reactions   Breyna  [budesonide -formoterol  Fumarate] Cough   Worsened existing cough        Medication List     TAKE these medications    acetaminophen  500 MG tablet Commonly known as: TYLENOL  Take 2 tablets (1,000 mg total) by mouth every 8 (eight)  hours.   albuterol  108 (90 Base) MCG/ACT inhaler Commonly known as: VENTOLIN  HFA Inhale 1 puff into the lungs every 6 (six) hours as needed for wheezing or shortness of breath.   benzonatate  200 MG capsule Commonly known as: TESSALON  Take 1 capsule (200 mg total) by mouth 3 (three) times daily.   Eliquis  5 MG Tabs tablet Generic drug: apixaban  Take 2 tablets (10 mg total) by mouth twice daily through 05/18 , on 05/19 switch to take 1 tablet (5 mg total) by mouth twice a day   enoxaparin 60 MG/0.6ML injection Commonly known as: LOVENOX Inject 0.6 mLs (60 mg total) into the skin 2 (two) times daily.   fenofibrate  160 MG tablet Take 160 mg by mouth daily.   ferrous sulfate 325 (65 FE) MG tablet Take 325 mg by mouth daily with breakfast.   guaiFENesin -codeine  100-10 MG/5ML syrup Take 10 mLs by mouth 3 (three) times daily as needed for cough.   Keytruda  100 MG/4ML Soln Generic drug: pembrolizumab  Inject 200 mg into the vein every 21 ( twenty-one) days.   megestrol  20 MG tablet Commonly known as: MEGACE  Take 40 mg by mouth 4 (four) times daily -  with meals and at bedtime.   ondansetron  4 MG tablet Commonly known as: ZOFRAN  Take 1 tablet (4 mg total) by mouth every 6 (six) hours as needed for nausea.   OVER THE COUNTER MEDICATION Apply 1 Application topically daily. Topical cream with unknown active ingredient/strength. Sample given by oncologist in office for daily use on radiation burn.   Oxycodone  HCl 10 MG Tabs Take 10 mg by mouth every 6 (six) hours as needed (moderate to severe pain).   OxyCONTIN  10 MG 12 hr tablet Generic drug: oxyCODONE  Take 1 tablet (10 mg total) by mouth every 12 (twelve) hours.   oxyCODONE  15 MG immediate release tablet Commonly known as: ROXICODONE  Take 1 tablet (15 mg total) by mouth every 4 (four) hours as needed for severe pain (pain score 7-10).   pantoprazole  40 MG tablet Commonly known as: PROTONIX  Take 1 tablet (40 mg total) by  mouth daily.   polyethylene glycol powder 17 GM/SCOOP powder Commonly known as: GLYCOLAX /MIRALAX  Take 17 g by mouth daily.   rosuvastatin  10 MG tablet Commonly known as: CRESTOR  Take 10 mg by mouth daily.   senna-docusate 8.6-50 MG tablet Commonly known as: Senokot-S Take 2 tablets by mouth at bedtime.   sucralfate  1 g tablet Commonly  known as: Carafate  Take 1 tablet (1 g total) by mouth 4 (four) times daily -  with meals and at bedtime.               Discharge Care Instructions  (From admission, onward)           Start     Ordered   03/18/24 0000  Discharge wound care:       Comments: It is important to relieve pressure off of the area that are getting injured, so try to walk or sit in positions where you are not resting your weight not it. Also, increasing protein intake will be important.   03/18/24 1036              Major procedures and Radiology Reports - PLEASE review detailed and final reports thoroughly  -        CT Soft Tissue Neck W Contrast Addendum Date: 03/15/2024 ADDENDUM REPORT: 03/15/2024 15:04 ADDENDUM: These results were called by telephone at the time of interpretation on 03/15/2024 at 3:04 pm to provider JULIE HAVILAND , who verbally acknowledged these results. Electronically Signed   By: Denny Flack M.D.   On: 03/15/2024 15:04   Result Date: 03/15/2024 CLINICAL DATA:  Neck mass, nonpulsatile.  Neck swelling. EXAM: CT NECK WITH CONTRAST TECHNIQUE: Multidetector CT imaging of the neck was performed using the standard protocol following the bolus administration of intravenous contrast. RADIATION DOSE REDUCTION: This exam was performed according to the departmental dose-optimization program which includes automated exposure control, adjustment of the mA and/or kV according to patient size and/or use of iterative reconstruction technique. CONTRAST:  75mL OMNIPAQUE  IOHEXOL  350 MG/ML SOLN COMPARISON:  Same day CTA chest and CTA chest dated  02/12/2024. PET CT 02/01/2024. FINDINGS: Pharynx and larynx: The nasopharynx is symmetric. Slight asymmetry of the left palatine tonsil. The oral cavity and floor of mouth are unremarkable. Normal appearance of the base of tongue. Normal appearance of the epiglottis. Aryepiglottic folds are symmetric. Vocal folds are symmetric. Salivary glands: No inflammation, mass, or stone. Thyroid: Normal. Lymph nodes: There is a lymph node conglomerate in right cervical level IV and involving the right supraclavicular fossa which measures 2.6 x 2.0 cm which appears similar to the prior PET CT. There are additional enlarged lymph nodes extending inferior to this nodal conglomerate which abuts the right brachiocephalic vein measuring up to 1.7 cm with central hypoattenuation concerning for central necrosis (series 2, image 104). Vascular: There is a filling defect within the right external jugular vein noted extending superiorly to the level of the angle of the mandible (series 2, image 53). The filling defect extends inferiorly within the external jugular vein with eventual non opacification of the vessel. There is significant distension of the external jugular vein within the right supraclavicular fossa noted on series 2, image 85). There is associated edema and stranding within the right neck along the sternocleidomastoid muscle extending into the posterior cervical region and supraclavicular fossa. There is likely narrowing of the right brachiocephalic vein due to enlarged lower cervical/supraclavicular lymph nodes. Mild atherosclerosis of the carotid bifurcations without high-grade stenosis appreciated. Limited intracranial: Negative. Visualized orbits: Negative. Mastoids and visualized paranasal sinuses: Air-fluid level in the left maxillary sinus. Mastoid air cells are clear. Skeleton: No acute or aggressive finding. Degenerative changes in the visualized cervical spine. Upper chest: Thoracic findings better evaluated on  same day CTA chest. There is redemonstration of right pleural effusion and necrotic right upper lobe mass as well as abnormal  appearing superior mediastinal lymph nodes. Emphysema noted. Other: None. IMPRESSION: There is thrombosis of the right external jugular vein extending from the level of the angle of the mandible inferiorly into the supraclavicular fossa. The vessel is significantly distended within the supraclavicular fossa with surrounding edema and stranding. Centrally necrotic lymph nodes in cervical level IV/supraclavicular fossa corresponding to findings on recent PET CT. Centrally necrotic nodal conglomerate abuts the right brachiocephalic vein and likely results in narrowing of the vessel. Mild asymmetric prominence of the left palatine tonsil. Recommend correlation with direct visualization. Thoracic findings better evaluated on same day CTA chest. Air-fluid level in the left maxillary sinus. Recommend correlation for acute sinusitis. Electronically Signed: By: Denny Flack M.D. On: 03/15/2024 14:56   CT Angio Chest PE W and/or Wo Contrast Result Date: 03/15/2024 CLINICAL DATA:  Neck swelling and mass. Lung cancer. Concern for pulmonary embolism. EXAM: CT ANGIOGRAPHY CHEST WITH CONTRAST TECHNIQUE: Multidetector CT imaging of the chest was performed using the standard protocol during bolus administration of intravenous contrast. Multiplanar CT image reconstructions and MIPs were obtained to evaluate the vascular anatomy. RADIATION DOSE REDUCTION: This exam was performed according to the departmental dose-optimization program which includes automated exposure control, adjustment of the mA and/or kV according to patient size and/or use of iterative reconstruction technique. CONTRAST:  75mL OMNIPAQUE  IOHEXOL  350 MG/ML SOLN COMPARISON:  CT dated 02/12/2024. FINDINGS: Evaluation of this exam is limited due to respiratory motion as streak artifact caused by patient's arms. Evaluation is also limited due  to streak artifact caused by dense contrast and overlying support wires. Cardiovascular: There is no cardiomegaly or pericardial effusion. Mild atherosclerotic calcification of the thoracic aorta. No aneurysmal dilatation or dissection. The the origins of the great vessels of the aortic arch appear patent. Evaluation of the pulmonary arteries is limited due to factors above. There are however small bilateral subsegmental pulmonary artery emboli which may be residual from prior PE. Recurrent acute PE is not excluded. Overall decrease in the clot burden since the prior CT. No evidence of right heart straining. Mediastinum/Nodes: Right hilar mass/adenopathy measures 3.8 x 3.7 cm inferiorly and 3.4 x 3.3 cm superiorly. Left hilar mass/adenopathy measures 2.5 x 2.5 cm, increased since the prior CT. The esophagus is grossly unremarkable no mediastinal fluid collection. Right neck mass/adenopathy with overall interval increase in the size of the supraclavicular lymph nodes compared to prior CT. There is diffuse skin edema and soft tissue swelling of the right side of the neck, new since the prior CT. Lungs/Pleura: Right pleural effusion, increased in size since the prior CT. An area of consolidative change extending from the right hilum into the right upper lobe relatively similar. There is background of emphysema. There is a 13 mm left lower lobe nodule increased in size since the prior CT. No pneumothorax. The central airways are patent. Upper Abdomen: No acute abnormality. Musculoskeletal: No acute osseous pathology. Review of the MIP images confirms the above findings. IMPRESSION: 1. Small bilateral subsegmental pulmonary artery emboli, likely residual from prior PE. Recurrent acute PE is not excluded. Overall decrease in the clot burden since the prior CT. No evidence of right heart straining. 2. Right pleural effusion, increased in size since the prior CT. 3. Right neck mass/adenopathy with overall interval increase  in the size of the supraclavicular lymph nodes compared to prior CT. There is diffuse skin edema and soft tissue swelling of the right neck, new since the prior CT. 4. Bilateral hilar masses/adenopathy, increased in size since the  prior CT. 5. A 13 mm left lower lobe nodule, increased in size since the prior CT. 6. Aortic Atherosclerosis (ICD10-I70.0) and Emphysema (ICD10-J43.9). 03/15/2024 These results were called by telephone at the time of interpretation on 03/15/2024 at 2:29 pm to provider JULIE HAVILAND , who verbally acknowledged these results. Electronically Signed   By: Angus Bark M.D.   On: 03/15/2024 14:47    Micro Results    No results found for this or any previous visit (from the past 240 hours).  Today   Subjective    Jeff Romero feels  ready to go home.  Denies chest pain, shortness of breath or abdominal pain.  Feels they can take care of themselves with the resources they have at home.  Objective   Blood pressure 120/67, pulse (!) 106, temperature 98.2 F (36.8 C), temperature source Oral, resp. rate 20, height 5' 7 (1.702 m), weight 54 kg, SpO2 91%.   Intake/Output Summary (Last 24 hours) at 03/18/2024 1037 Last data filed at 03/18/2024 0900 Gross per 24 hour  Intake 1200 ml  Output 500 ml  Net 700 ml    Exam General: Patient  in good spirits sitting up in bed in no acute distress.  Eyes: sclera anicteric, conjuctiva mild injection bilaterally CVS: S1-S2, regular  Respiratory:  decreased air entry bilaterally secondary to decreased inspiratory effort, rales at bases  GI: NABS, soft, NT  LE: No edema.  Neuro: A/O x 3, Moving all extremities equally with normal strength, CN 3-12 intact, grossly nonfocal.  Psych: patient is logical and coherent, judgement and insight appear normal, mood and affect appropriate to situation.    Data Review   CBC w Diff:  Lab Results  Component Value Date   WBC 8.9 03/18/2024   HGB 8.1 (L) 03/18/2024   HGB 6.6 (LL)  03/14/2024   HCT 26.9 (L) 03/18/2024   PLT 382 03/18/2024   PLT 435 (H) 03/14/2024   LYMPHOPCT 5 03/15/2024   MONOPCT 10 03/15/2024   EOSPCT 1 03/15/2024   BASOPCT 1 03/15/2024    CMP:  Lab Results  Component Value Date   NA 132 (L) 03/17/2024   K 4.1 03/17/2024   CL 97 (L) 03/17/2024   CO2 24 03/17/2024   BUN 13 03/17/2024   CREATININE 0.78 03/17/2024   CREATININE 0.74 03/14/2024   PROT 5.8 (L) 03/17/2024   ALBUMIN 1.9 (L) 03/17/2024   BILITOT 0.3 03/17/2024   BILITOT 0.3 03/14/2024   ALKPHOS 98 03/17/2024   AST 36 03/17/2024   AST 21 03/14/2024   ALT 30 03/17/2024   ALT 18 03/14/2024  .   Total Time in preparing paper work, data evaluation and todays exam - 35 minutes  Magdalene School M.D on 03/18/2024 at 10:37 AM  [facility]

## 2024-03-18 NOTE — Progress Notes (Signed)
 Daily Progress Note   Patient Name: Jeff Romero       Date: 03/18/2024 DOB: 05-03-1961  Age: 63 y.o. MRN#: [id number] Attending Physician: Donley Furth* Primary Care Physician: [facility] Admit Date: 03/15/2024  Reason for Consultation/Follow-up: Establishing goals of care  Subjective: Chart Reviewed. Updates Received. Patient Assessed. Patient sitting up in bed watching videos on phone. No acute distress noted. Continues to have some coughing but reports some improvement compared to yesterday. Reports use of nebulizer made his cough worst and has not been using. No acute events overnight.   Nausea controlled with antiemetics. Patient scheduled to discharge home today.  We discussed his regimen at length.  Pain is well-controlled on OxyContin  10 mg every 12 hours and oxycodone  15 mg every 4 hours as needed.  I have confirmed with pharmacy that medications are readily available and will be delivered to patient's room prior to discharge with meds to bed.  Education provided to patient on use of medication with plans to follow-up outpatient on Monday for close symptom management.  He verbalized understanding and appreciation.  All questions answered and support provided.   Length of Stay: 3 days  Vital Signs: BP 120/67   Pulse (!) 106   Temp 98.2 F (36.8 C) (Oral)   Resp 20   Ht 5' 7 (1.702 m)   Wt 54 kg   SpO2 91%   BMI 18.65 kg/m  SpO2: SpO2: 91 % O2 Device: O2 Device: Room Air O2 Flow Rate: O2 Flow Rate (L/min): 2 L/min Last Weight  Most recent update: 03/18/2024  5:42 AM    Weight  54 kg (119 lb 1.6 oz)             Intake/Output Summary (Last 24 hours) at 03/18/2024 1157 Last data filed at 03/18/2024 0900 Gross per 24 hour  Intake 960 ml  Output 500 ml  Net 460 ml    Physical Exam: Gen:  NAD HEENT: moist mucous membranes CV: Regular rate and rhythm, no murmurs rubs or gallops PULM: Diminished, cough  ABD:  soft/nontender/nondistended/normal bowel sounds EXT: Right neck tenderness, JVD, swelling Neuro: Alert and oriented x3  Palliative Care Assessment & Plan  HPI: Palliative Care consult requested for goals of care discussion in this 63 y.o. male  with medical history of stage IV non small cell lung cancer with right upper lobe lung mass and metastatic bone lesions (01/2024), PE,  admitted on 03/15/2024 after being seen at [facility] and experiencing ongoing cough and right neck JVD, swelling. CT angio showed small bilateral pulmonary artery embolus, right pleural effusion.   Code Status: Full code  Recommendations/Plan:  Continue with current plan of care per medical team  Patient scheduled to discharge home later today.  Pain is well-controlled on current regimen.  Education provided on home use of medication. Medications are readily available by [facility].  OxyContin  and oxycodone  IR scheduled for delivery via meds to bed prior to discharge.  I have spoken with pharmacy and confirm medications are available. PMT will continue to support and follow on as needed basis. Please secure chat for urgent needs. I will plan to follow-up with patient at [facility] cancer center on Monday for close symptom management follow-up.  Symptom Management: Pain OxyContin  10 mg every 12 hours Oxycodone  IR 15 mg every 4 hours as needed for moderate to severe/breakthrough pain Hydromorphone  0.5-1 mg every 2 hours as needed for severe pain not controlled by oral regimen. Nausea  Zofran  8 mg every 6 hours as needed Constipation Senna 1 tablet twice daily  Dulcolax suppository as needed Cough Guaifenesin /codeine  10 mL every 6 hours as needed for cough  Thank you for allowing the Palliative Medicine Team to assist in the care of this patient.  Palliative Medicine Team providers are available by phone from 7am to 7pm daily and can be reached through the team cell phone. Should this  patient require assistance outside of these hours, please call the patient's attending physician.  Any controlled substances utilized were prescribed in the context of palliative care. PDMP has been reviewed.  Visit consisted of counseling and education dealing with the complex and emotionally intense issues of symptom management and palliative care in the setting of serious and potentially life-threatening illness.  Dellia Ferguson, AGPCNP-BC  [facility]  [phone]  *Please note that this is a verbal dictation therefore any spelling or grammatical errors are due to the Dragon Medical One system interpretation.

## 2024-03-18 NOTE — Progress Notes (Signed)
 TOC meds in a secure bag delivered to pt in room by this RN. Placed on the window seat. Primary RN updated on med delivery

## 2024-03-18 NOTE — Plan of Care (Signed)

## 2024-03-19 ENCOUNTER — Other Ambulatory Visit (HOSPITAL_COMMUNITY): Payer: Self-pay

## 2024-03-20 ENCOUNTER — Other Ambulatory Visit: Payer: Self-pay | Admitting: Radiation Oncology

## 2024-03-20 ENCOUNTER — Other Ambulatory Visit: Payer: Self-pay

## 2024-03-20 ENCOUNTER — Ambulatory Visit
Admission: RE | Admit: 2024-03-20 | Discharge: 2024-03-20 | Disposition: A | Discharge status: Home / Self Care | Source: Ambulatory Visit | Attending: Radiation Oncology | Admitting: Radiation Oncology

## 2024-03-20 ENCOUNTER — Telehealth: Payer: Self-pay | Admitting: *Deleted

## 2024-03-20 DIAGNOSIS — Z510 Encounter for antineoplastic radiation therapy: Secondary | ICD-10-CM | POA: Diagnosis present

## 2024-03-20 DIAGNOSIS — C7951 Secondary malignant neoplasm of bone: Secondary | ICD-10-CM | POA: Diagnosis not present

## 2024-03-20 DIAGNOSIS — Z79899 Other long term (current) drug therapy: Secondary | ICD-10-CM | POA: Diagnosis not present

## 2024-03-20 DIAGNOSIS — Z79620 Long term (current) use of immunosuppressive biologic: Secondary | ICD-10-CM | POA: Insufficient documentation

## 2024-03-20 DIAGNOSIS — Z87891 Personal history of nicotine dependence: Secondary | ICD-10-CM | POA: Diagnosis not present

## 2024-03-20 DIAGNOSIS — C3411 Malignant neoplasm of upper lobe, right bronchus or lung: Secondary | ICD-10-CM

## 2024-03-20 DIAGNOSIS — Z5112 Encounter for antineoplastic immunotherapy: Secondary | ICD-10-CM | POA: Insufficient documentation

## 2024-03-20 DIAGNOSIS — D649 Anemia, unspecified: Secondary | ICD-10-CM | POA: Insufficient documentation

## 2024-03-20 LAB — RAD ONC ARIA SESSION SUMMARY
Course Elapsed Days: 0
Plan Fractions Treated to Date: 1
Plan Prescribed Dose Per Fraction: 3 Gy
Plan Total Fractions Prescribed: 10
Plan Total Prescribed Dose: 30 Gy
Reference Point Dosage Given to Date: 3 Gy
Reference Point Session Dosage Given: 3 Gy
Session Number: 1

## 2024-03-20 NOTE — Progress Notes (Signed)
 I was called back to the treatment area at to evaluate the patient in the treatment area. [name] has a diagnosis of Stage IV, cT2bN2M1c, NSCLC, NOS of the RUL.  He recently completed a palliative course of radiation to his right lung, and begin systemic immunotherapy with Keytruda  with his first infusion on 03/13/2024.  Last week he was hospitalized between 03/15/24 and 03/18/2024 and found to have venous thrombosis in his jugular vasculature with a history of PE on Eliquis .  It was felt that there was significant extrinsic compression of his cervical and supraclavicular lymph nodes contributing to the narrowing of his vasculature and he was discharged on Lovenox.  During that hospitalization we were asked to see him about considering palliative radiation to his cervical lymph node stations and he completed simulation last Friday and is just starting this treatment today.  His wife told our staff that he has been having a significant difficulty now being home with coughing and prior to his hospitalization had a prescription for Tessalon  Perles and has also been taking short and long-acting narcotics neither of which have provided any relief. He is coughing so much that he is at times retching but not able to bring up much mucus.  He is not having any fevers.  He notes that his cough seems to worsen when he lays flat especially at nighttime.   I reviewed his CT scan from his hospitalization and it appears that he had a right pleural effusion that in my opinion was larger than seen on his CT chest on 01/20/2024.  On the table today as I was approaching the clinical treatment area I could hear him coughing down the hallway even with the vault door closed.  On exam this is a chronically ill-appearing African-American male with temporal wasting and signs of cachexia.  Chest is auscultated posteriorly and breath sounds are noted in bilateral apices and mid fields as well as the left lower lung field however on the right I  struggle to hear any breath sounds below the level of the scapula.  I discussed these findings with the patient and his wife who happens to be a respiratory therapist who is an Secondary school teacher for respiratory therapy students but worked many years in [employer].  I was able to show her his films, and also reached out to his pulmonologist Dr. Fulton Job.  She recommended further imaging, and I also reached out to radiology for their input.  I spoke with [place], PAC in IR and she approved at least ultrasound assessment to see if there is enough fluid to perform a thoracentesis.  I spoke with the patient's wife and the patient and they are still motivated to consider thoracentesis.  Brittainy reassured that the patient would not need to hold his Lovenox in order to have this procedure.  We will order this to be performed by radiology soon as possible.

## 2024-03-20 NOTE — Telephone Encounter (Signed)
 Called patient's wife-[name] Titzer to inform of US /Thora. for 03-21-24- arrival time- 12:30 pm @ [facility], no restrictions to scan, patient to go in Entrance A @ [facility], spoke with patient's wife- [name] and she is aware of this scan and the instructions

## 2024-03-21 ENCOUNTER — Ambulatory Visit (HOSPITAL_COMMUNITY)
Admission: RE | Admit: 2024-03-21 | Discharge: 2024-03-21 | Disposition: A | Discharge status: Home / Self Care | Source: Ambulatory Visit | Attending: Physician Assistant | Admitting: Physician Assistant

## 2024-03-21 ENCOUNTER — Other Ambulatory Visit: Payer: Self-pay

## 2024-03-21 ENCOUNTER — Encounter

## 2024-03-21 ENCOUNTER — Ambulatory Visit (HOSPITAL_COMMUNITY)
Admission: RE | Admit: 2024-03-21 | Discharge: 2024-03-21 | Disposition: A | Discharge status: Home / Self Care | Source: Ambulatory Visit | Attending: Radiation Oncology | Admitting: Radiation Oncology

## 2024-03-21 ENCOUNTER — Ambulatory Visit
Admission: RE | Admit: 2024-03-21 | Discharge: 2024-03-21 | Discharge status: Home / Self Care | Source: Ambulatory Visit | Attending: Radiation Oncology

## 2024-03-21 DIAGNOSIS — C3411 Malignant neoplasm of upper lobe, right bronchus or lung: Secondary | ICD-10-CM | POA: Insufficient documentation

## 2024-03-21 DIAGNOSIS — Z5112 Encounter for antineoplastic immunotherapy: Secondary | ICD-10-CM | POA: Diagnosis not present

## 2024-03-21 DIAGNOSIS — J90 Pleural effusion, not elsewhere classified: Secondary | ICD-10-CM | POA: Insufficient documentation

## 2024-03-21 LAB — RAD ONC ARIA SESSION SUMMARY
Course Elapsed Days: 1
Plan Fractions Treated to Date: 2
Plan Prescribed Dose Per Fraction: 3 Gy
Plan Total Fractions Prescribed: 10
Plan Total Prescribed Dose: 30 Gy
Reference Point Dosage Given to Date: 6 Gy
Reference Point Session Dosage Given: 3 Gy
Session Number: 2

## 2024-03-21 MED ORDER — LIDOCAINE HCL 1 % IJ SOLN
INTRAMUSCULAR | Status: AC
  Filled 2024-03-21: qty 20

## 2024-03-21 MED ORDER — LIDOCAINE HCL 1 % IJ SOLN
10.0000 mL | Freq: Once | INTRAMUSCULAR | Status: AC
  Administered 2024-03-21: 10 mL via INTRADERMAL

## 2024-03-21 NOTE — Procedures (Signed)
 PROCEDURE SUMMARY:  Successful US  guided right thoracentesis. Yielded 550 mL of hazy yellow fluid. Patient tolerated procedure well. No immediate complications. EBL = trace  Specimen was sent for labs.  Post procedure chest X-ray reveals no pneumothorax  Bela Bonaparte S Kaena Santori PA-C 03/21/2024 3:00 PM

## 2024-03-22 ENCOUNTER — Other Ambulatory Visit: Payer: Self-pay

## 2024-03-22 ENCOUNTER — Ambulatory Visit: Payer: Self-pay | Admitting: Radiation Oncology

## 2024-03-22 ENCOUNTER — Ambulatory Visit
Admission: RE | Admit: 2024-03-22 | Discharge: 2024-03-22 | Disposition: A | Discharge status: Home / Self Care | Source: Ambulatory Visit | Attending: Radiation Oncology | Admitting: Radiation Oncology

## 2024-03-22 ENCOUNTER — Telehealth: Payer: Self-pay | Admitting: Radiation Oncology

## 2024-03-22 DIAGNOSIS — Z5112 Encounter for antineoplastic immunotherapy: Secondary | ICD-10-CM | POA: Diagnosis not present

## 2024-03-22 LAB — RAD ONC ARIA SESSION SUMMARY
Course Elapsed Days: 2
Plan Fractions Treated to Date: 3
Plan Prescribed Dose Per Fraction: 3 Gy
Plan Total Fractions Prescribed: 10
Plan Total Prescribed Dose: 30 Gy
Reference Point Dosage Given to Date: 9 Gy
Reference Point Session Dosage Given: 3 Gy
Session Number: 3

## 2024-03-22 LAB — CYTOLOGY - NON PAP

## 2024-03-22 NOTE — Telephone Encounter (Signed)
 I spoke with the patient and his wife and he slept better overnight since he had the thoracentesis. We reviewed that the cytology on the fluid was consistent with cancer in the specimen. We will continue with treatment we have planned to his cervical nodes and he will keep us  informed of symptoms of worsening cough or difficulty laying flat since those were his main complaints prior to thora.

## 2024-03-23 ENCOUNTER — Other Ambulatory Visit

## 2024-03-23 ENCOUNTER — Ambulatory Visit
Admission: RE | Admit: 2024-03-23 | Discharge: 2024-03-23 | Disposition: A | Discharge status: Home / Self Care | Source: Ambulatory Visit | Attending: Radiation Oncology | Admitting: Radiation Oncology

## 2024-03-23 ENCOUNTER — Inpatient Hospital Stay: Admitting: Nurse Practitioner

## 2024-03-23 ENCOUNTER — Other Ambulatory Visit: Payer: Self-pay

## 2024-03-23 DIAGNOSIS — Z5112 Encounter for antineoplastic immunotherapy: Secondary | ICD-10-CM | POA: Diagnosis not present

## 2024-03-23 LAB — RAD ONC ARIA SESSION SUMMARY
Course Elapsed Days: 3
Plan Fractions Treated to Date: 4
Plan Prescribed Dose Per Fraction: 3 Gy
Plan Total Fractions Prescribed: 10
Plan Total Prescribed Dose: 30 Gy
Reference Point Dosage Given to Date: 12 Gy
Reference Point Session Dosage Given: 3 Gy
Session Number: 4

## 2024-03-24 ENCOUNTER — Ambulatory Visit
Admission: RE | Admit: 2024-03-24 | Discharge: 2024-03-24 | Disposition: A | Discharge status: Home / Self Care | Source: Ambulatory Visit | Attending: Radiation Oncology | Admitting: Radiation Oncology

## 2024-03-24 ENCOUNTER — Other Ambulatory Visit: Payer: Self-pay | Admitting: Nurse Practitioner

## 2024-03-24 ENCOUNTER — Encounter: Payer: Self-pay | Admitting: Nutrition

## 2024-03-24 ENCOUNTER — Other Ambulatory Visit (HOSPITAL_COMMUNITY): Payer: Self-pay

## 2024-03-24 ENCOUNTER — Inpatient Hospital Stay: Admitting: Nutrition

## 2024-03-24 ENCOUNTER — Encounter: Payer: Self-pay | Admitting: Internal Medicine

## 2024-03-24 ENCOUNTER — Other Ambulatory Visit: Payer: Self-pay

## 2024-03-24 DIAGNOSIS — C3411 Malignant neoplasm of upper lobe, right bronchus or lung: Secondary | ICD-10-CM

## 2024-03-24 DIAGNOSIS — Z5112 Encounter for antineoplastic immunotherapy: Secondary | ICD-10-CM | POA: Diagnosis not present

## 2024-03-24 DIAGNOSIS — Z515 Encounter for palliative care: Secondary | ICD-10-CM

## 2024-03-24 DIAGNOSIS — G893 Neoplasm related pain (acute) (chronic): Secondary | ICD-10-CM

## 2024-03-24 LAB — RAD ONC ARIA SESSION SUMMARY
Course Elapsed Days: 4
Plan Fractions Treated to Date: 5
Plan Prescribed Dose Per Fraction: 3 Gy
Plan Total Fractions Prescribed: 10
Plan Total Prescribed Dose: 30 Gy
Reference Point Dosage Given to Date: 15 Gy
Reference Point Session Dosage Given: 3 Gy
Session Number: 5

## 2024-03-24 MED ORDER — OXYCODONE HCL ER 10 MG PO T12A
10.0000 mg | EXTENDED_RELEASE_TABLET | Freq: Two times a day (BID) | ORAL | 0 refills | Status: DC
  Filled 2024-03-24: qty 45, 23d supply, fill #0

## 2024-03-24 NOTE — Progress Notes (Signed)
 Patient did not show up for nutrition appointment.

## 2024-03-26 ENCOUNTER — Encounter: Payer: Self-pay | Admitting: Internal Medicine

## 2024-03-27 ENCOUNTER — Ambulatory Visit
Admission: RE | Admit: 2024-03-27 | Discharge: 2024-03-27 | Disposition: A | Discharge status: Home / Self Care | Source: Ambulatory Visit | Attending: Radiation Oncology | Admitting: Radiation Oncology

## 2024-03-27 ENCOUNTER — Other Ambulatory Visit: Payer: Self-pay

## 2024-03-27 DIAGNOSIS — Z5112 Encounter for antineoplastic immunotherapy: Secondary | ICD-10-CM | POA: Diagnosis not present

## 2024-03-27 LAB — RAD ONC ARIA SESSION SUMMARY
Course Elapsed Days: 7
Plan Fractions Treated to Date: 6
Plan Prescribed Dose Per Fraction: 3 Gy
Plan Total Fractions Prescribed: 10
Plan Total Prescribed Dose: 30 Gy
Reference Point Dosage Given to Date: 18 Gy
Reference Point Session Dosage Given: 3 Gy
Session Number: 6

## 2024-03-28 ENCOUNTER — Encounter: Payer: Self-pay | Admitting: Radiation Oncology

## 2024-03-28 ENCOUNTER — Inpatient Hospital Stay (HOSPITAL_BASED_OUTPATIENT_CLINIC_OR_DEPARTMENT_OTHER): Admitting: Nurse Practitioner

## 2024-03-28 ENCOUNTER — Inpatient Hospital Stay: Admitting: Dietician

## 2024-03-28 ENCOUNTER — Other Ambulatory Visit: Payer: Self-pay

## 2024-03-28 ENCOUNTER — Other Ambulatory Visit (HOSPITAL_COMMUNITY): Payer: Self-pay

## 2024-03-28 ENCOUNTER — Encounter: Payer: Self-pay | Admitting: Nurse Practitioner

## 2024-03-28 ENCOUNTER — Ambulatory Visit
Admission: RE | Admit: 2024-03-28 | Discharge: 2024-03-28 | Disposition: A | Discharge status: Home / Self Care | Source: Ambulatory Visit | Attending: Radiation Oncology | Admitting: Radiation Oncology

## 2024-03-28 ENCOUNTER — Other Ambulatory Visit: Payer: Self-pay | Admitting: Radiation Oncology

## 2024-03-28 VITALS — BP 111/69 | HR 116 | Temp 98.4°F | Resp 19 | Ht 67.0 in | Wt 120.8 lb

## 2024-03-28 DIAGNOSIS — R530 Neoplastic (malignant) related fatigue: Secondary | ICD-10-CM

## 2024-03-28 DIAGNOSIS — Z515 Encounter for palliative care: Secondary | ICD-10-CM

## 2024-03-28 DIAGNOSIS — G893 Neoplasm related pain (acute) (chronic): Secondary | ICD-10-CM

## 2024-03-28 DIAGNOSIS — R059 Cough, unspecified: Secondary | ICD-10-CM | POA: Diagnosis not present

## 2024-03-28 DIAGNOSIS — C3411 Malignant neoplasm of upper lobe, right bronchus or lung: Secondary | ICD-10-CM

## 2024-03-28 DIAGNOSIS — Z5112 Encounter for antineoplastic immunotherapy: Secondary | ICD-10-CM | POA: Diagnosis not present

## 2024-03-28 DIAGNOSIS — K5903 Drug induced constipation: Secondary | ICD-10-CM

## 2024-03-28 LAB — RAD ONC ARIA SESSION SUMMARY
Course Elapsed Days: 8
Plan Fractions Treated to Date: 7
Plan Prescribed Dose Per Fraction: 3 Gy
Plan Total Fractions Prescribed: 10
Plan Total Prescribed Dose: 30 Gy
Reference Point Dosage Given to Date: 21 Gy
Reference Point Session Dosage Given: 3 Gy
Session Number: 7

## 2024-03-28 MED ORDER — BENZONATATE 200 MG PO CAPS
200.0000 mg | ORAL_CAPSULE | Freq: Three times a day (TID) | ORAL | 1 refills | Status: DC
Start: 2024-03-28 — End: 2024-05-12
  Filled 2024-03-28: qty 90, 30d supply, fill #0
  Filled 2024-05-04: qty 90, 30d supply, fill #1

## 2024-03-28 MED ORDER — ONDANSETRON HCL 8 MG PO TABS
4.0000 mg | ORAL_TABLET | Freq: Three times a day (TID) | ORAL | 2 refills | Status: DC | PRN
  Filled 2024-03-28: qty 45, 30d supply, fill #0

## 2024-03-28 MED ORDER — HYDROCODONE BIT-HOMATROP MBR 5-1.5 MG/5ML PO SOLN
5.0000 mL | Freq: Three times a day (TID) | ORAL | 0 refills | Status: DC | PRN
  Filled 2024-03-28: qty 120, 8d supply, fill #0

## 2024-03-28 MED ORDER — OXYCODONE HCL 15 MG PO TABS
15.0000 mg | ORAL_TABLET | ORAL | 0 refills | Status: DC | PRN
  Filled 2024-03-28: qty 90, 15d supply, fill #0

## 2024-03-28 MED ORDER — OXYCODONE HCL ER 10 MG PO T12A: 10.0000 mg | EXTENDED_RELEASE_TABLET | Freq: Three times a day (TID) | ORAL | Status: DC

## 2024-03-28 NOTE — Progress Notes (Signed)
 Dr. Dewey recommended ordering a CXR to evaluate for recurrence of his malignant right pleural effusion. An order was placed and we'll follow up with results when available.

## 2024-03-28 NOTE — Progress Notes (Signed)
 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 He is receiving palliative immunotherapy concurrent with radiation. Pt is under the care of Dr. Dewey and Dr. Sherrod.   Past medical history includes bilateral PE, acute jugular vein thrombosis, hepatitis, alcohol use, PCM  Met with pt and significant other ([name]) in office. Pt is fatigued, appears cachectic. Significant other reports pt has gained wt since hospitalization. He is eating 2-3 small meals. Snacks on fruit. He enjoys watermelon. Wife has been encouraging lots of fruits/vegetables. Reports slowly introducing foods they had recently stopped eating (dairy, meats, sugar) since diagnosis. Pt had episode of nausea this afternoon. Relates this to high temperatures as well as pain medication taken just before leaving the house. This has resolved. He is taking miralax , prune juice, and papaya seeds for constipation per wife. Bowels moving daily.   Nutrition Focused Physical Exam: completed 6/12 by inpt RD - noted severe fat/muscle depletions   Medications: tessalon , lovenox , fenofibrate , ferrous sulfate, hycodan, megace , zofran , oxycontin , roxicodone , protonix , miralax , crestor , senna-s, carafate    Labs: 6/13 - Na 132, glucose 100, albumin 1.9   Anthropometrics:   Height: 5'7 Weight: 121.6 lb (6/20 aria) UBW: 135 lb 5.8 oz (4/18) BMI: 18.97   NUTRITION DIAGNOSIS: Inadequate oral intake related to lung cancer as evidenced by cough, shortness of breath, 10% wt loss in 8 weeks - severe for time frame    MALNUTRITION DIAGNOSIS: Severe malnutrition (diagnosed 6/12 by inpt RD) continues    INTERVENTION:  Educated on small frequent meals/snacks high in calories and protein - snack ideas + shake recipes provided Discussed soft moist protein foods for ease of intake (adding  gravy/sauces/tender meats) - ideas provided  Recommend 2 Ensure Complete/equivalent in between meals - Ensure/CIB powder + coupons provided  Contact information provided   MONITORING, EVALUATION, GOAL: Pt will tolerate increased calories and protein to promote wt gain    Next Visit: Monday July 21 during infusion with Joli

## 2024-03-28 NOTE — Progress Notes (Signed)
 Palliative Medicine [facility]  [phone] [phone]   Name: Jeff Romero Date: 03/28/2024 MRN: [id number]  DOB: Jul 16, 1961  Patient Care Team: [facility], [facility] as PCP - General Bouchard, Duwaine BROCKS, RN as Oncology Nurse Navigator Pickenpack-Cousar, Fannie SAILOR, NP as Nurse Practitioner (Hospice and Palliative Medicine)    INTERVAL HISTORY: Jeff Romero is a 63 y.o. 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  Palliative is seeing patient for symptom management and goals of care.   SOCIAL HISTORY:     reports that he quit smoking about 9 years ago. His smoking use included e-cigarettes. He has never used smokeless tobacco. He reports current alcohol use of about 6.0 standard drinks of alcohol per week. He reports that he does not use drugs.  ADVANCE DIRECTIVES:  None on file   CODE STATUS: Full code  PAST MEDICAL HISTORY: Past Medical History:  Diagnosis Date   Allergy    Anxiety    Arthritis    Blood transfusion without reported diagnosis    Cataract    Depression    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   Prostate cancer (HCC)     ALLERGIES:  is allergic to breyna  [budesonide -formoterol  fumarate].  MEDICATIONS:  Current Outpatient Medications  Medication Sig Dispense Refill   acetaminophen  (TYLENOL ) 500 MG tablet Take 2 tablets (1,000 mg total) by mouth every 8 (eight) hours.     albuterol  (VENTOLIN  HFA) 108 (90 Base) MCG/ACT inhaler Inhale 1 puff into the lungs every 6 (six) hours as needed for wheezing or shortness of breath.     benzonatate  (TESSALON ) 200 MG capsule Take 1 capsule (200 mg total) by mouth 3 (three) times daily. 90 capsule 1   enoxaparin  (LOVENOX ) 60 MG/0.6ML injection Inject 0.6 mLs (60 mg total) into the skin 2 (two) times daily. 36 mL 0   fenofibrate  160 MG tablet Take 160  mg by mouth daily.     ferrous sulfate 325 (65 FE) MG tablet Take 325 mg by mouth daily with breakfast.     HYDROcodone  bit-homatropine (HYCODAN) 5-1.5 MG/5ML syrup Take 5 mLs by mouth every 8 (eight) hours as needed for cough. 120 mL 0   megestrol  (MEGACE ) 20 MG tablet Take 40 mg by mouth 4 (four) times daily -  with meals and at bedtime.     OVER THE COUNTER MEDICATION Apply 1 Application topically daily. Topical cream with unknown active ingredient/strength. Sample given by oncologist in office for daily use on radiation burn.     oxyCODONE  (ROXICODONE ) 15 MG immediate release tablet Take 1 tablet (15 mg total) by mouth every 4 (four) hours as needed for severe pain (pain score 7-10). 90 tablet 0   pantoprazole  (PROTONIX ) 40 MG tablet Take 1 tablet (40 mg total) by mouth daily. 30 tablet 3   pembrolizumab  (KEYTRUDA ) 100 MG/4ML SOLN Inject 200 mg into the vein every 21 ( twenty-one) days.     polyethylene glycol powder (GLYCOLAX /MIRALAX ) 17 GM/SCOOP powder Take 17 g by mouth daily. 476 g 1   rosuvastatin  (CRESTOR ) 10 MG tablet Take 10 mg by mouth daily.     senna-docusate (SENOKOT-S) 8.6-50 MG tablet Take 2 tablets by mouth at bedtime. 120 tablet 0   sucralfate  (CARAFATE ) 1 g tablet Take 1 tablet (1 g total) by mouth 4 (four) times daily -  with meals and at  bedtime. 120 tablet 1   ondansetron  (ZOFRAN ) 8 MG tablet Take 0.5 tablets (4 mg total) by mouth every 8 (eight) hours as needed for nausea. 45 tablet 2   oxyCODONE  (OXYCONTIN ) 10 mg 12 hr tablet Take 1 tablet (10 mg total) by mouth every 8 (eight) hours.     No current facility-administered medications for this visit.    VITAL SIGNS: BP 111/69 (BP Location: Right Arm, Patient Position: Sitting)   Pulse (!) 116   Temp 98.4 F (36.9 C) (Temporal)   Resp 19   Ht 5' 7 (1.702 m)   Wt 120 lb 12.8 oz (54.8 kg)   SpO2 98%   BMI 18.92 kg/m  Filed Weights   03/28/24 1523  Weight: 120 lb 12.8 oz (54.8 kg)    Estimated body mass index is  18.92 kg/m as calculated from the following:   Height as of this encounter: 5' 7 (1.702 m).   Weight as of this encounter: 120 lb 12.8 oz (54.8 kg).   PERFORMANCE STATUS (ECOG) : 1 - Symptomatic but completely ambulatory  Physical Exam General: NAD Cardiovascular: regular rate and rhythm Pulmonary: normal breathing pattern Extremities: no edema, no joint deformities Skin: no rashes Neurological: AAO x3  IMPRESSION: Discussed the use of AI scribe software for clinical note transcription with the patient, who gave verbal consent to proceed.  History of Present Illness Jeff Romero is a 63 year old male with non-small cell lung cancer who presents to clinic today for symptom management follow-up. He is accompanied by his wife. Currently undergoing radiation treatments. Persistent cough and uncontrolled pain. Appetite is fair. Some days are better than others. Weight stable at 120lbs.  No consistent shortness of breath, noting that it 'comes and goes' however troublesome.   He has experienced increased coughing, particularly severe in the mornings, described as 'crucial' and accompanied by pain. He has been using cough syrup containing guaifenesin  and codeine , Tessalon  Perles, and Chloraseptic throat spray, which he received during a previous hospital visit. Despite these measures, the cough has worsened. During visit patient has several coughing spells causing difficulty in holding a conversation. Request has been made by Donald, [place] for chest x-ray to rule out potential pleural effusion. Patient made aware to go for imaging after appointments today. Education provided on continued ways to assist in decreasing cough. Unfortunately the use of nebulizer made his coughing and wheeze worst. Patient to continue with Tessalon  Perles, use of Chloraseptic spray, cool mist humidifier. We will change cough syrup to hycodan however extensive education provided on use in addition to other pain medications.  Patient and wife verbalized understanding.   Reports occasional constipation which he has gotten better managed with use of Miralax  and Senna. He has experienced episodes of nausea, possibly related to heat exposure, as noted during a recent visit. He does not currently have Zofran  at home for nausea management and is requesting refill.  Mr. Reamer is experiencing significant pain, particularly in the mornings, described as severe enough to cause shaking. He is currently taking oxycodone  15 mg every four hours as needed and Oxycontin  10mg  every 12 hours. Despite this regimen, he often wakes up in severe pain, requiring additional Tylenol  to manage his symptoms. His wife reports waking him up in the middle of the night to administer pain medication to prevent morning pain crises as he experienced over the past several days. She reports patient was unable to move and get started for his appointment due to pain limited his ability  to get dressed. I reviewed patient's pain and regimen at length. Patient and wife instructed to increase Oxycontin  to every 8 hours for remainder of prescription, continue with Oxy IR every 4 hours as needed. We will continue to closely monitor and support.   I discussed the importance of continued conversation with family and their medical providers regarding overall plan of care and treatment options, ensuring decisions are within the context of the patients values and GOCs. Assessment & Plan Pain management Severe pain persists despite current regimen, indicating insufficiency. - Increase OxyContin  to every eight hours with two extra strength acetaminophen  (500 mg each). - Instruct to take oxycodone  15 mg every four hours as needed. - Advise to take pain medication and Hycodan at least two hours apart.  Cough Persistent cough unresponsive to previous treatments. Chest x-ray for further evaluation per Donald, [place].  - Prescribe Hycodan cough syrup, up to three times daily as  needed. - Monitor for drowsiness and avoid concurrent use with other pain medications. - Continue Tessalon  Perles  -Discontinue use of guaifenesin  and codeine  syrup  Constipation Constipation remains an ongoing concern. - Continue senna at bedtime.  -Continue daily Miralax    Nausea Nausea of unclear etiology. - Prescribe ondansetron  for nausea.  Goals of Care Discussed symptom management to improve quality of life, emphasizing medication timing. - Patient is clear in expressed wishes to continue to treat the treatable allowing him every opportunity to thrive.  - Ensure medications are taken at appropriate intervals to avoid excessive drowsiness.  Follow-up Regular follow-up every three to four weeks unless issues arise. - Schedule follow-up appointment on April 03, 2024. - Call for prescription refills three to four days before depletion, preferably on Wednesday or early Thursday.  Patient expressed understanding and was in agreement with this plan. He also understands that He can call the clinic at any time with any questions, concerns, or complaints.   Any controlled substances utilized were prescribed in the context of palliative care. PDMP has been reviewed.   Visit consisted of counseling and education dealing with the complex and emotionally intense issues of symptom management and palliative care in the setting of serious and potentially life-threatening illness.  Levon Borer, AGPCNP-BC  Palliative Medicine Team/[facility]

## 2024-03-29 ENCOUNTER — Other Ambulatory Visit: Payer: Self-pay

## 2024-03-29 ENCOUNTER — Ambulatory Visit
Admission: RE | Admit: 2024-03-29 | Discharge: 2024-03-29 | Disposition: A | Discharge status: Home / Self Care | Source: Ambulatory Visit | Attending: Radiation Oncology

## 2024-03-29 ENCOUNTER — Other Ambulatory Visit (HOSPITAL_COMMUNITY): Payer: Self-pay

## 2024-03-29 DIAGNOSIS — Z5112 Encounter for antineoplastic immunotherapy: Secondary | ICD-10-CM | POA: Diagnosis not present

## 2024-03-29 LAB — RAD ONC ARIA SESSION SUMMARY
Course Elapsed Days: 9
Plan Fractions Treated to Date: 8
Plan Prescribed Dose Per Fraction: 3 Gy
Plan Total Fractions Prescribed: 10
Plan Total Prescribed Dose: 30 Gy
Reference Point Dosage Given to Date: 24 Gy
Reference Point Session Dosage Given: 3 Gy
Session Number: 8

## 2024-03-30 ENCOUNTER — Other Ambulatory Visit: Payer: Self-pay

## 2024-03-30 ENCOUNTER — Other Ambulatory Visit

## 2024-03-30 ENCOUNTER — Ambulatory Visit
Admission: RE | Admit: 2024-03-30 | Discharge: 2024-03-30 | Disposition: A | Discharge status: Home / Self Care | Source: Ambulatory Visit | Attending: Radiation Oncology | Admitting: Radiation Oncology

## 2024-03-30 DIAGNOSIS — Z5112 Encounter for antineoplastic immunotherapy: Secondary | ICD-10-CM | POA: Diagnosis not present

## 2024-03-30 LAB — RAD ONC ARIA SESSION SUMMARY
Course Elapsed Days: 10
Plan Fractions Treated to Date: 9
Plan Prescribed Dose Per Fraction: 3 Gy
Plan Total Fractions Prescribed: 10
Plan Total Prescribed Dose: 30 Gy
Reference Point Dosage Given to Date: 27 Gy
Reference Point Session Dosage Given: 3 Gy
Session Number: 9

## 2024-03-31 ENCOUNTER — Other Ambulatory Visit: Payer: Self-pay

## 2024-03-31 ENCOUNTER — Ambulatory Visit
Admission: RE | Admit: 2024-03-31 | Discharge: 2024-03-31 | Disposition: A | Discharge status: Home / Self Care | Source: Ambulatory Visit | Attending: Radiation Oncology

## 2024-03-31 ENCOUNTER — Telehealth: Payer: Self-pay | Admitting: Physician Assistant

## 2024-03-31 ENCOUNTER — Ambulatory Visit
Admission: RE | Admit: 2024-03-31 | Discharge: 2024-03-31 | Disposition: A | Discharge status: Home / Self Care | Source: Ambulatory Visit | Attending: Radiation Oncology | Admitting: Radiation Oncology

## 2024-03-31 ENCOUNTER — Other Ambulatory Visit: Payer: Self-pay | Admitting: Radiation Oncology

## 2024-03-31 DIAGNOSIS — C3490 Malignant neoplasm of unspecified part of unspecified bronchus or lung: Secondary | ICD-10-CM

## 2024-03-31 DIAGNOSIS — Z5112 Encounter for antineoplastic immunotherapy: Secondary | ICD-10-CM | POA: Diagnosis not present

## 2024-03-31 LAB — RAD ONC ARIA SESSION SUMMARY
Course Elapsed Days: 11
Plan Fractions Treated to Date: 10
Plan Prescribed Dose Per Fraction: 3 Gy
Plan Total Fractions Prescribed: 10
Plan Total Prescribed Dose: 30 Gy
Reference Point Dosage Given to Date: 30 Gy
Reference Point Session Dosage Given: 3 Gy
Session Number: 10

## 2024-03-31 NOTE — Progress Notes (Unsigned)
 [facility] OFFICE PROGRESS NOTE  Center, [facility] [address] 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    Biomarker Findings HRD signature - HRDsig Negative Microsatellite status - MS-Stable Tumor Mutational Burden - 6 Muts/Mb Genomic Findings For a complete list of the genes assayed, please refer to the Appendix. KRAS G12D STK11 splice site [id number]>T MCL1 amplification RICTOR amplification TP53 V157F 7 Disease relevant genes with no reportable alterations: ALK, BRAF, EGFR, ERBB2, MET, RET, ROS1   PDL1 TPS 60%.  PRIOR THERAPY: 1) radiotherapy to the large right upper lobe lung mass under the care of Dr. Dewey. Completed on *** 2) Radiation to the right cervical lymph node under the care of Dr. Dewey. Last day of radiation on 03/31/24.   CURRENT THERAPY: Palliative systemic chemotherapy with Keytruda  200 mg IV every 3 weeks. First dose on 03/13/24  INTERVAL HISTORY: Jeff Romero 63 y.o. male returns to the clinic today for follow-up visit.  The patient was last seen in clinic by Dr. Sherrod on 03/01/24.  The patient was recently diagnosed with stage IV lung cancer.  He is currently on systemic treatment with immunotherapy with Keytruda .   The patient was recently found to have a jugular vein thrombus for which she is on heparin .  The patient also is currently undergoing radiation to the right cervical lymph nodes under the care of of Dr. Dewey and Isaiah the last day radiation was on 03/31/2024.   The neck swelling is ***at this time.  Has any abnormal bleeding or bruising.  The patient is followed closely by palliative care for  uncontrolled pain for which she is currently taking Oxy IR every 4 hours and long-acting oxycodone  every 8 hours.  He is also on Zofran  for nausea.  He also has Hycodan for cough but he knows to take this at least 2 hours apart from his pain medication.  The patient is no longer on Megace  due to his recent thrombus.  He also takes 2 extra strength Tylenol 's.  He also has Tessalon  Perle for cough.  He takes MiraLAX  and senna for constipation.  He denies any fever, chills, night sweats, or unexplained weight loss.  Appetite?  See nutrition?  His shortness of breath is ***.  Cough?  He denies any hemoptysis.  Swelling?  He denies any vomiting.  Constipation?  He denies any diarrhea.  He denies any rashes or skin changes.  He is here today for evaluation repeat blood work before undergoing cycle #2.   MEDICAL HISTORY: Past Medical History:  Diagnosis Date   Allergy    Anxiety    Arthritis    Blood transfusion without reported diagnosis    Cataract    Depression    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   Prostate cancer (HCC)     ALLERGIES:  is allergic to breyna  [budesonide -formoterol  fumarate].  MEDICATIONS:  Current Outpatient Medications  Medication Sig Dispense Refill   acetaminophen  (TYLENOL ) 500 MG tablet Take 2 tablets (1,000 mg total) by mouth every 8 (eight) hours.     albuterol  (VENTOLIN  HFA) 108 (90 Base) MCG/ACT inhaler Inhale 1 puff into the lungs every 6 (six) hours as needed for wheezing  or shortness of breath.     benzonatate  (TESSALON ) 200 MG capsule Take 1 capsule (200 mg total) by mouth 3 (three) times daily. 90 capsule 1   enoxaparin  (LOVENOX ) 60 MG/0.6ML injection Inject 0.6 mLs (60 mg total) into the skin 2 (two) times daily. 36 mL 0   fenofibrate  160 MG tablet Take 160 mg by mouth daily.     ferrous sulfate 325 (65 FE) MG tablet Take 325 mg by mouth daily with breakfast.     HYDROcodone  bit-homatropine (HYCODAN) 5-1.5 MG/5ML syrup Take 5 mLs  by mouth every 8 (eight) hours as needed for cough. 120 mL 0   megestrol  (MEGACE ) 20 MG tablet Take 40 mg by mouth 4 (four) times daily -  with meals and at bedtime.     ondansetron  (ZOFRAN ) 8 MG tablet Take 0.5 tablets (4 mg total) by mouth every 8 (eight) hours as needed for nausea. 45 tablet 2   OVER THE COUNTER MEDICATION Apply 1 Application topically daily. Topical cream with unknown active ingredient/strength. Sample given by oncologist in office for daily use on radiation burn.     oxyCODONE  (OXYCONTIN ) 10 mg 12 hr tablet Take 1 tablet (10 mg total) by mouth every 8 (eight) hours.     oxyCODONE  (ROXICODONE ) 15 MG immediate release tablet Take 1 tablet (15 mg total) by mouth every 4 (four) hours as needed. 90 tablet 0   pantoprazole  (PROTONIX ) 40 MG tablet Take 1 tablet (40 mg total) by mouth daily. 30 tablet 3   pembrolizumab  (KEYTRUDA ) 100 MG/4ML SOLN Inject 200 mg into the vein every 21 ( twenty-one) days.     polyethylene glycol powder (GLYCOLAX /MIRALAX ) 17 GM/SCOOP powder Take 17 g by mouth daily. 476 g 1   rosuvastatin  (CRESTOR ) 10 MG tablet Take 10 mg by mouth daily.     senna-docusate (SENOKOT-S) 8.6-50 MG tablet Take 2 tablets by mouth at bedtime. 120 tablet 0   sucralfate  (CARAFATE ) 1 g tablet Take 1 tablet (1 g total) by mouth 4 (four) times daily -  with meals and at bedtime. 120 tablet 1   No current facility-administered medications for this visit.    SURGICAL HISTORY:  Past Surgical History:  Procedure Laterality Date   BRONCHIAL BIOPSY  01/21/2024   Procedure: BRONCHOSCOPY, WITH BIOPSY;  Surgeon: Gretta Leita SQUIBB, DO;  Location: [facility];  Service: Cardiopulmonary;;   BRONCHIAL BRUSHINGS  01/21/2024   Procedure: BRONCHOSCOPY, WITH BRUSH BIOPSY;  Surgeon: Gretta Leita SQUIBB, DO;  Location: [facility];  Service: Cardiopulmonary;;   FLEXIBLE BRONCHOSCOPY N/A 01/21/2024   Procedure: ELLIOTT SIDE;  Surgeon: Gretta Leita SQUIBB, DO;  Location: [facility];  Service:  Cardiopulmonary;  Laterality: N/A;   INGUINAL HERNIA REPAIR Right 09/18/2021   Procedure: OPEN RIGHT INGUINAL HERNIA REPAIR WITH MESH;  Surgeon: Kinsinger, Herlene Righter, MD;  Location: [facility];  Service: General;  Laterality: Right;   IR THORACENTESIS ASP PLEURAL SPACE W/IMG GUIDE  03/21/2024   KNEE CARTILAGE SURGERY Right    PROSTATE SURGERY     ROTATOR CUFF REPAIR Right     REVIEW OF SYSTEMS:   Review of Systems  Constitutional: Negative for appetite change, chills, fatigue, fever and unexpected weight change.  HENT:   Negative for mouth sores, nosebleeds, sore throat and trouble swallowing.   Eyes: Negative for eye problems and icterus.  Respiratory: Negative for cough, hemoptysis, shortness of breath and wheezing.   Cardiovascular: Negative for chest pain and leg swelling.  Gastrointestinal: Negative for abdominal pain, constipation, diarrhea, nausea  and vomiting.  Genitourinary: Negative for bladder incontinence, difficulty urinating, dysuria, frequency and hematuria.   Musculoskeletal: Negative for back pain, gait problem, neck pain and neck stiffness.  Skin: Negative for itching and rash.  Neurological: Negative for dizziness, extremity weakness, gait problem, headaches, light-headedness and seizures.  Hematological: Negative for adenopathy. Does not bruise/bleed easily.  Psychiatric/Behavioral: Negative for confusion, depression and sleep disturbance. The patient is not nervous/anxious.     PHYSICAL EXAMINATION:  There were no vitals taken for this visit.  ECOG PERFORMANCE STATUS: {CHL ONC ECOG [id number]  Physical Exam  Constitutional: Oriented to person, place, and time and well-developed, well-nourished, and in no distress. No distress.  HENT:  Head: Normocephalic and atraumatic.  Mouth/Throat: Oropharynx is clear and moist. No oropharyngeal exudate.  Eyes: Conjunctivae are normal. Right eye exhibits no discharge. Left eye exhibits no discharge. No scleral icterus.   Neck: Normal range of motion. Neck supple.  Cardiovascular: Normal rate, regular rhythm, normal heart sounds and intact distal pulses.   Pulmonary/Chest: Effort normal and breath sounds normal. No respiratory distress. No wheezes. No rales.  Abdominal: Soft. Bowel sounds are normal. Exhibits no distension and no mass. There is no tenderness.  Musculoskeletal: Normal range of motion. Exhibits no edema.  Lymphadenopathy:    No cervical adenopathy.  Neurological: Alert and oriented to person, place, and time. Exhibits normal muscle tone. Gait normal. Coordination normal.  Skin: Skin is warm and dry. No rash noted. Not diaphoretic. No erythema. No pallor.  Psychiatric: Mood, memory and judgment normal.  Vitals reviewed.  LABORATORY DATA: Lab Results  Component Value Date   WBC 8.9 03/18/2024   HGB 8.1 (L) 03/18/2024   HCT 26.9 (L) 03/18/2024   MCV 83.3 03/18/2024   PLT 382 03/18/2024      Chemistry      Component Value Date/Time   NA 132 (L) 03/17/2024 0430   K 4.1 03/17/2024 0430   CL 97 (L) 03/17/2024 0430   CO2 24 03/17/2024 0430   BUN 13 03/17/2024 0430   CREATININE 0.78 03/17/2024 0430   CREATININE 0.74 03/14/2024 1126      Component Value Date/Time   CALCIUM  8.3 (L) 03/17/2024 0430   ALKPHOS 98 03/17/2024 0430   AST 36 03/17/2024 0430   AST 21 03/14/2024 1126   ALT 30 03/17/2024 0430   ALT 18 03/14/2024 1126   BILITOT 0.3 03/17/2024 0430   BILITOT 0.3 03/14/2024 1126       RADIOGRAPHIC STUDIES:  IR THORACENTESIS ASP PLEURAL SPACE W/IMG GUIDE Result Date: 03/21/2024 INDICATION: lung cancer, right pleural effusion causing cough Lung cancer with right pleural effusion. Request for diagnostic and therapeutic thoracentesis. EXAM: ULTRASOUND GUIDED RIGHT THORACENTESIS MEDICATIONS: 1% lidocaine  10 mL COMPLICATIONS: None immediate. PROCEDURE: An ultrasound guided thoracentesis was thoroughly discussed with the patient and questions answered. The benefits, risks,  alternatives and complications were also discussed. The patient understands and wishes to proceed with the procedure. Written consent was obtained. Ultrasound was performed to localize and mark an adequate pocket of fluid in the RIGHT chest. The area was then prepped and draped in the normal sterile fashion. 1% Lidocaine  was used for local anesthesia. Under ultrasound guidance a 6 Fr Safe-T-Centesis catheter was introduced. Thoracentesis was performed. The catheter was removed and a dressing applied. FINDINGS: A total of approximately 550 mL of hazy gold fluid was removed. Samples were sent to the laboratory as requested by the clinical team. IMPRESSION: Successful ultrasound guided RIGHT thoracentesis yielding 550 mL of pleural fluid. No  pneumothorax on post-procedure chest x-ray. Procedure performed by: Sari Lamp, PA-C Electronically Signed   By: Thom Hall M.D.   On: 03/21/2024 15:13   DG Chest 1 View Result Date: 03/21/2024 CLINICAL DATA:  Status post right thoracentesis EXAM: CHEST  1 VIEW COMPARISON:  Chest radiograph dated 02/12/2024 FINDINGS: Normal lung volumes. Decreased hazy right lung opacity. Mild residual hazy right upper and right basilar opacities. Decreased small right pleural effusion, including a loculated right upper lung component. No pneumothorax. The heart size and mediastinal contours are within normal limits. No acute osseous abnormality. IMPRESSION: 1. Decreased small right pleural effusion, including a loculated right upper lung component. No pneumothorax. 2. Decreased hazy right lung opacity, likely atelectasis. Electronically Signed   By: Limin  Xu M.D.   On: 03/21/2024 14:45   CT Soft Tissue Neck W Contrast Addendum Date: 03/15/2024 ADDENDUM REPORT: 03/15/2024 15:04 ADDENDUM: These results were called by telephone at the time of interpretation on 03/15/2024 at 3:04 pm to provider JULIE HAVILAND , who verbally acknowledged these results. Electronically Signed   By: Donnice Mania  M.D.   On: 03/15/2024 15:04   Result Date: 03/15/2024 CLINICAL DATA:  Neck mass, nonpulsatile.  Neck swelling. EXAM: CT NECK WITH CONTRAST TECHNIQUE: Multidetector CT imaging of the neck was performed using the standard protocol following the bolus administration of intravenous contrast. RADIATION DOSE REDUCTION: This exam was performed according to the departmental dose-optimization program which includes automated exposure control, adjustment of the mA and/or kV according to patient size and/or use of iterative reconstruction technique. CONTRAST:  75mL OMNIPAQUE  IOHEXOL  350 MG/ML SOLN COMPARISON:  Same day CTA chest and CTA chest dated 02/12/2024. PET CT 02/01/2024. FINDINGS: Pharynx and larynx: The nasopharynx is symmetric. Slight asymmetry of the left palatine tonsil. The oral cavity and floor of mouth are unremarkable. Normal appearance of the base of tongue. Normal appearance of the epiglottis. Aryepiglottic folds are symmetric. Vocal folds are symmetric. Salivary glands: No inflammation, mass, or stone. Thyroid: Normal. Lymph nodes: There is a lymph node conglomerate in right cervical level IV and involving the right supraclavicular fossa which measures 2.6 x 2.0 cm which appears similar to the prior PET CT. There are additional enlarged lymph nodes extending inferior to this nodal conglomerate which abuts the right brachiocephalic vein measuring up to 1.7 cm with central hypoattenuation concerning for central necrosis (series 2, image 104). Vascular: There is a filling defect within the right external jugular vein noted extending superiorly to the level of the angle of the mandible (series 2, image 53). The filling defect extends inferiorly within the external jugular vein with eventual non opacification of the vessel. There is significant distension of the external jugular vein within the right supraclavicular fossa noted on series 2, image 85). There is associated edema and stranding within the right  neck along the sternocleidomastoid muscle extending into the posterior cervical region and supraclavicular fossa. There is likely narrowing of the right brachiocephalic vein due to enlarged lower cervical/supraclavicular lymph nodes. Mild atherosclerosis of the carotid bifurcations without high-grade stenosis appreciated. Limited intracranial: Negative. Visualized orbits: Negative. Mastoids and visualized paranasal sinuses: Air-fluid level in the left maxillary sinus. Mastoid air cells are clear. Skeleton: No acute or aggressive finding. Degenerative changes in the visualized cervical spine. Upper chest: Thoracic findings better evaluated on same day CTA chest. There is redemonstration of right pleural effusion and necrotic right upper lobe mass as well as abnormal appearing superior mediastinal lymph nodes. Emphysema noted. Other: None. IMPRESSION: There is thrombosis  of the right external jugular vein extending from the level of the angle of the mandible inferiorly into the supraclavicular fossa. The vessel is significantly distended within the supraclavicular fossa with surrounding edema and stranding. Centrally necrotic lymph nodes in cervical level IV/supraclavicular fossa corresponding to findings on recent PET CT. Centrally necrotic nodal conglomerate abuts the right brachiocephalic vein and likely results in narrowing of the vessel. Mild asymmetric prominence of the left palatine tonsil. Recommend correlation with direct visualization. Thoracic findings better evaluated on same day CTA chest. Air-fluid level in the left maxillary sinus. Recommend correlation for acute sinusitis. Electronically Signed: By: Donnice Mania M.D. On: 03/15/2024 14:56   CT Angio Chest PE W and/or Wo Contrast Result Date: 03/15/2024 CLINICAL DATA:  Neck swelling and mass. Lung cancer. Concern for pulmonary embolism. EXAM: CT ANGIOGRAPHY CHEST WITH CONTRAST TECHNIQUE: Multidetector CT imaging of the chest was performed using the  standard protocol during bolus administration of intravenous contrast. Multiplanar CT image reconstructions and MIPs were obtained to evaluate the vascular anatomy. RADIATION DOSE REDUCTION: This exam was performed according to the departmental dose-optimization program which includes automated exposure control, adjustment of the mA and/or kV according to patient size and/or use of iterative reconstruction technique. CONTRAST:  75mL OMNIPAQUE  IOHEXOL  350 MG/ML SOLN COMPARISON:  CT dated 02/12/2024. FINDINGS: Evaluation of this exam is limited due to respiratory motion as streak artifact caused by patient's arms. Evaluation is also limited due to streak artifact caused by dense contrast and overlying support wires. Cardiovascular: There is no cardiomegaly or pericardial effusion. Mild atherosclerotic calcification of the thoracic aorta. No aneurysmal dilatation or dissection. The the origins of the great vessels of the aortic arch appear patent. Evaluation of the pulmonary arteries is limited due to factors above. There are however small bilateral subsegmental pulmonary artery emboli which may be residual from prior PE. Recurrent acute PE is not excluded. Overall decrease in the clot burden since the prior CT. No evidence of right heart straining. Mediastinum/Nodes: Right hilar mass/adenopathy measures 3.8 x 3.7 cm inferiorly and 3.4 x 3.3 cm superiorly. Left hilar mass/adenopathy measures 2.5 x 2.5 cm, increased since the prior CT. The esophagus is grossly unremarkable no mediastinal fluid collection. Right neck mass/adenopathy with overall interval increase in the size of the supraclavicular lymph nodes compared to prior CT. There is diffuse skin edema and soft tissue swelling of the right side of the neck, new since the prior CT. Lungs/Pleura: Right pleural effusion, increased in size since the prior CT. An area of consolidative change extending from the right hilum into the right upper lobe relatively similar.  There is background of emphysema. There is a 13 mm left lower lobe nodule increased in size since the prior CT. No pneumothorax. The central airways are patent. Upper Abdomen: No acute abnormality. Musculoskeletal: No acute osseous pathology. Review of the MIP images confirms the above findings. IMPRESSION: 1. Small bilateral subsegmental pulmonary artery emboli, likely residual from prior PE. Recurrent acute PE is not excluded. Overall decrease in the clot burden since the prior CT. No evidence of right heart straining. 2. Right pleural effusion, increased in size since the prior CT. 3. Right neck mass/adenopathy with overall interval increase in the size of the supraclavicular lymph nodes compared to prior CT. There is diffuse skin edema and soft tissue swelling of the right neck, new since the prior CT. 4. Bilateral hilar masses/adenopathy, increased in size since the prior CT. 5. A 13 mm left lower lobe nodule, increased in size  since the prior CT. 6. Aortic Atherosclerosis (ICD10-I70.0) and Emphysema (ICD10-J43.9). 03/15/2024 These results were called by telephone at the time of interpretation on 03/15/2024 at 2:29 pm to provider JULIE HAVILAND , who verbally acknowledged these results. Electronically Signed   By: Vanetta Chou M.D.   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  Molecular studies showed no actionable mutations and PD-L1 expression was 60%.  The patient underwent palliative radiation to the lung mass.  He also completed radiation to the right cervical lymph node under the care of Dr. Dewey which was  completed on 03/31/2024.  The patient was seen by Dr. Sherrod today.  Labs were reviewed.  Recommend that he ***cycle #2 today as scheduled.  He will continue Lovenox  due to his recent jugular vein thrombosis.  He will continue to follow with palliative care for pain management  He will continue his pain medication, MiraLAX , senna, and Zofran  for nausea.  We will see him back in 3 weeks for evaluation and repeat blood work before undergoing cycle #3.  Nutrition?  ***tachycardia   The patient was advised to call immediately if he has any concerning symptoms in the interval. The patient voices understanding of current disease status and treatment options and is in agreement with the current care plan. All questions were answered. The patient knows to call the clinic with any problems, questions or concerns. We can certainly see the patient much sooner if necessary       No orders of the defined types were placed in this encounter.    I spent {CHL ONC TIME VISIT - DTPQU:[id number]} counseling the patient face to face. The total time spent in the appointment was {CHL ONC TIME VISIT - DTPQU:[id number]}.  Adhrit Krenz L Zaahir Pickney, PA-C 03/31/24

## 2024-03-31 NOTE — Telephone Encounter (Signed)
 Called the patient and spoke to the wife to reschedule appointments per provider on PAL. Confirmed appointment changes.

## 2024-04-03 ENCOUNTER — Inpatient Hospital Stay

## 2024-04-03 ENCOUNTER — Inpatient Hospital Stay: Admitting: Nurse Practitioner

## 2024-04-03 ENCOUNTER — Ambulatory Visit

## 2024-04-03 ENCOUNTER — Inpatient Hospital Stay (HOSPITAL_BASED_OUTPATIENT_CLINIC_OR_DEPARTMENT_OTHER): Admitting: Physician Assistant

## 2024-04-03 VITALS — BP 117/59 | HR 106

## 2024-04-03 VITALS — BP 119/73 | HR 120 | Temp 97.9°F | Resp 17 | Wt 116.3 lb

## 2024-04-03 DIAGNOSIS — R638 Other symptoms and signs concerning food and fluid intake: Secondary | ICD-10-CM

## 2024-04-03 DIAGNOSIS — C3411 Malignant neoplasm of upper lobe, right bronchus or lung: Secondary | ICD-10-CM

## 2024-04-03 DIAGNOSIS — Z5112 Encounter for antineoplastic immunotherapy: Secondary | ICD-10-CM | POA: Insufficient documentation

## 2024-04-03 DIAGNOSIS — D649 Anemia, unspecified: Secondary | ICD-10-CM

## 2024-04-03 DIAGNOSIS — E43 Unspecified severe protein-calorie malnutrition: Secondary | ICD-10-CM | POA: Diagnosis not present

## 2024-04-03 DIAGNOSIS — G893 Neoplasm related pain (acute) (chronic): Secondary | ICD-10-CM

## 2024-04-03 LAB — PREPARE RBC (CROSSMATCH)

## 2024-04-03 LAB — CBC WITH DIFFERENTIAL (CANCER CENTER ONLY)
Abs Immature Granulocytes: 0.14 10*3/uL — ABNORMAL HIGH (ref 0.00–0.07)
Basophils Absolute: 0 10*3/uL (ref 0.0–0.1)
Basophils Relative: 0 %
Eosinophils Absolute: 0.1 10*3/uL (ref 0.0–0.5)
Eosinophils Relative: 1 %
HCT: 22.2 % — ABNORMAL LOW (ref 39.0–52.0)
Hemoglobin: 7 g/dL — ABNORMAL LOW (ref 13.0–17.0)
Immature Granulocytes: 2 %
Lymphocytes Relative: 3 %
Lymphs Abs: 0.3 10*3/uL — ABNORMAL LOW (ref 0.7–4.0)
MCH: 24 pg — ABNORMAL LOW (ref 26.0–34.0)
MCHC: 31.5 g/dL (ref 30.0–36.0)
MCV: 76 fL — ABNORMAL LOW (ref 80.0–100.0)
Monocytes Absolute: 0.9 10*3/uL (ref 0.1–1.0)
Monocytes Relative: 9 %
Neutro Abs: 7.7 10*3/uL (ref 1.7–7.7)
Neutrophils Relative %: 85 %
Platelet Count: 376 10*3/uL (ref 150–400)
RBC: 2.92 MIL/uL — ABNORMAL LOW (ref 4.22–5.81)
RDW: 19 % — ABNORMAL HIGH (ref 11.5–15.5)
WBC Count: 9.1 10*3/uL (ref 4.0–10.5)
nRBC: 0 % (ref 0.0–0.2)

## 2024-04-03 LAB — CMP (CANCER CENTER ONLY)
ALT: 32 U/L (ref 0–44)
AST: 34 U/L (ref 15–41)
Albumin: 2.7 g/dL — ABNORMAL LOW (ref 3.5–5.0)
Alkaline Phosphatase: 149 U/L — ABNORMAL HIGH (ref 38–126)
Anion gap: 10 (ref 5–15)
BUN: 14 mg/dL (ref 8–23)
CO2: 25 mmol/L (ref 22–32)
Calcium: 8.9 mg/dL (ref 8.9–10.3)
Chloride: 103 mmol/L (ref 98–111)
Creatinine: 0.66 mg/dL (ref 0.61–1.24)
GFR, Estimated: >60 mL/min (ref >60)
Glucose, Bld: 118 mg/dL — ABNORMAL HIGH (ref 70–99)
Potassium: 3.5 mmol/L (ref 3.5–5.1)
Sodium: 138 mmol/L (ref 135–145)
Total Bilirubin: 0.3 mg/dL (ref 0.0–1.2)
Total Protein: 6.5 g/dL (ref 6.5–8.1)

## 2024-04-03 MED ORDER — SODIUM CHLORIDE 0.9 % IV SOLN: INTRAVENOUS | Status: DC

## 2024-04-03 MED ORDER — SODIUM CHLORIDE 0.9 % IV SOLN
200.0000 mg | Freq: Once | INTRAVENOUS | Status: AC
  Administered 2024-04-03: 200 mg via INTRAVENOUS
  Filled 2024-04-03: qty 200

## 2024-04-03 MED ORDER — SODIUM CHLORIDE 0.9 % IV SOLN: INTRAVENOUS | Status: AC

## 2024-04-03 NOTE — Progress Notes (Signed)
 Ok to tx with tachycardia and Hgb=7 today per Cassie H, PA.  Elizzie Westergard, PharmD, MBA

## 2024-04-03 NOTE — Patient Instructions (Signed)

## 2024-04-03 NOTE — Radiation Completion Notes (Addendum)
  Radiation Oncology         [id number]) [id number] ________________________________  Name: Jeff Romero MRN: [id number]  Date of Service: 03/31/2024  DOB: 09-18-1961  End of Treatment Note   Diagnosis: Stage IV, cT2bN2M1c, NSCLC, NOS of the RUL.   Intent: Palliative     ==========DELIVERED PLANS==========  First Treatment Date: 2024-03-20 Last Treatment Date: 2024-03-31   Plan Name: HN_R Site: Neck Right Technique: 3D Mode: Photon Dose Per Fraction: 3 Gy Prescribed Dose (Delivered / Prescribed): 30 Gy / 30 Gy Prescribed Fxs (Delivered / Prescribed): 10 / 10     ==========ON TREATMENT VISIT DATES========== 2024-03-24, 2024-03-31    See weekly On Treatment Notes in Epic for details in the Media tab (listed as Progress notes on the On Treatment Visit Dates listed above). The patient tolerated radiation. He developed fatigue and anticipated skin changes in the treatment field, esophagitis, and had low back and upper buttock pain at a site of a decubitus ulcer. Dr. Dewey ordered a CT scan to follow up with his pain since it had been some time since his most recent comprehensive body imaging.  The patient will receive a call in about one month from the radiation oncology department. He will continue follow up with Dr. Sherrod as well.      Jeff Romero KYM Husband, PAC

## 2024-04-04 ENCOUNTER — Other Ambulatory Visit

## 2024-04-04 ENCOUNTER — Encounter: Payer: Self-pay | Admitting: Nurse Practitioner

## 2024-04-04 ENCOUNTER — Inpatient Hospital Stay: Attending: Internal Medicine

## 2024-04-04 ENCOUNTER — Other Ambulatory Visit (HOSPITAL_COMMUNITY): Payer: Self-pay

## 2024-04-04 ENCOUNTER — Inpatient Hospital Stay: Admitting: Nurse Practitioner

## 2024-04-04 DIAGNOSIS — D649 Anemia, unspecified: Secondary | ICD-10-CM | POA: Insufficient documentation

## 2024-04-04 DIAGNOSIS — Z87891 Personal history of nicotine dependence: Secondary | ICD-10-CM | POA: Insufficient documentation

## 2024-04-04 DIAGNOSIS — K5903 Drug induced constipation: Secondary | ICD-10-CM | POA: Diagnosis not present

## 2024-04-04 DIAGNOSIS — R634 Abnormal weight loss: Secondary | ICD-10-CM

## 2024-04-04 DIAGNOSIS — Z79899 Other long term (current) drug therapy: Secondary | ICD-10-CM | POA: Insufficient documentation

## 2024-04-04 DIAGNOSIS — C3411 Malignant neoplasm of upper lobe, right bronchus or lung: Secondary | ICD-10-CM | POA: Diagnosis present

## 2024-04-04 DIAGNOSIS — Z5112 Encounter for antineoplastic immunotherapy: Secondary | ICD-10-CM | POA: Diagnosis present

## 2024-04-04 DIAGNOSIS — R630 Anorexia: Secondary | ICD-10-CM

## 2024-04-04 DIAGNOSIS — R001 Bradycardia, unspecified: Secondary | ICD-10-CM | POA: Diagnosis not present

## 2024-04-04 DIAGNOSIS — Z79620 Long term (current) use of immunosuppressive biologic: Secondary | ICD-10-CM | POA: Insufficient documentation

## 2024-04-04 DIAGNOSIS — Z515 Encounter for palliative care: Secondary | ICD-10-CM

## 2024-04-04 DIAGNOSIS — G893 Neoplasm related pain (acute) (chronic): Secondary | ICD-10-CM

## 2024-04-04 DIAGNOSIS — F32A Depression, unspecified: Secondary | ICD-10-CM

## 2024-04-04 DIAGNOSIS — C7951 Secondary malignant neoplasm of bone: Secondary | ICD-10-CM | POA: Diagnosis present

## 2024-04-04 MED ORDER — ACETAMINOPHEN 325 MG PO TABS
650.0000 mg | ORAL_TABLET | Freq: Once | ORAL | Status: AC
  Administered 2024-04-04: 650 mg via ORAL
  Filled 2024-04-04: qty 2

## 2024-04-04 MED ORDER — NYSTATIN 100000 UNIT/ML MT SUSP
10.0000 mL | Freq: Four times a day (QID) | OROMUCOSAL | 1 refills | Status: DC | PRN
  Filled 2024-04-04: qty 240, 6d supply, fill #0

## 2024-04-04 MED ORDER — SODIUM CHLORIDE 0.9% IV SOLUTION
250.0000 mL | INTRAVENOUS | Status: DC
  Administered 2024-04-04: 250 mL via INTRAVENOUS

## 2024-04-04 MED ORDER — DIPHENHYDRAMINE HCL 25 MG PO CAPS
25.0000 mg | ORAL_CAPSULE | Freq: Once | ORAL | Status: AC
  Administered 2024-04-04: 25 mg via ORAL
  Filled 2024-04-04: qty 1

## 2024-04-04 NOTE — Patient Instructions (Signed)
 Blood Transfusion, Adult A blood transfusion is a procedure in which you receive blood or a type of blood cell (blood component) through an IV. You may need a blood transfusion when you have a low blood count, which is a low number of any blood cell. This may result from a bleeding disorder, illness, injury, or surgery. The blood may come from a donor, or you may be able to have your own blood collected and stored (autologous blood donation) before a planned surgery. The blood given in a transfusion may be made up of different blood components. You may receive: Red blood cells. These carry oxygen to the cells in the body. Platelets. These help your blood to clot. Plasma. This is the liquid part of your blood. It carries proteins and other substances throughout the body. White blood cells. These help you fight infections. If you have hemophilia or another clotting disorder, you may also receive other types of blood products. Depending on the type of blood product, this procedure may take 1-4 hours to complete. Tell a health care provider about: Any bleeding problems you have. Any previous reactions you have had during a blood transfusion. Any allergies you have. All medicines you are taking, including vitamins, herbs, eye drops, creams, and over-the-counter medicines. Any surgeries you have had. Any medical conditions you have. Whether you are pregnant or may be pregnant. What are the risks? Talk with your health care provider about risks. The most common problems include: A mild allergic reaction, such as red, swollen areas of skin (hives) and itching. Fever or chills. This may be the body's response to new blood cells received. This may occur during or up to 4 hours after the transfusion. More serious problems may include: A serious allergic reaction that causes difficulty breathing or swelling around the face and lips. Transfusion-associated circulatory overload (TACO), or too much fluid in  the lungs. This may cause breathing problems. Transfusion-related acute lung injury (TRALI), which causes breathing difficulty and low oxygen in the blood. This can occur within hours of the transfusion or several days later. Iron overload. This can happen after receiving many blood transfusions over a period of time. Infection or virus being transmitted. This is rare because donated blood is carefully tested before it is given. Hemolytic transfusion reaction. This is rare. It happens when the body's defense system (immune system)tries to attack the new blood cells. Symptoms may include fever, chills, nausea, low blood pressure, and low back or chest pain. Transfusion-associated graft-versus-host disease (TAGVHD). This is rare. It happens when donated cells attack the body's healthy tissues. What happens before the procedure? You will have a blood test to check your blood type. This test is done to know what kind of blood your body will accept and to match it to the donor blood. If you are going to have a planned surgery, you may be able to do an autologous blood donation. This may be done in case you need to have a transfusion. You will have your temperature, blood pressure, and pulse checked before the transfusion. If you have had an allergic reaction to a transfusion in the past, you may be given medicine to help prevent a reaction. This medicine may be given to you by mouth (orally) or through an IV. What happens during the procedure?  An IV will be inserted into one of your veins. The bag of blood will be attached to your IV. The blood will then enter through your vein. Your temperature, blood pressure,  and pulse will be monitored during the transfusion. This monitoring is done to detect early signs of a transfusion reaction. Tell your nurse right away if you have any of these symptoms during the transfusion: Shortness of breath or trouble breathing. Chest or back pain. Fever or  chills. Itching or hives. If you have any signs or symptoms of a reaction, your transfusion will be stopped and you may be given medicine. When the transfusion is complete, your IV will be removed. Pressure may be applied to the IV site for a few minutes. A bandage (dressing)will be applied. The procedure may vary among health care providers and hospitals. What happens after the procedure? Your temperature, blood pressure, pulse, breathing rate, and blood oxygen level will be monitored until you leave the hospital or clinic. Your blood may be tested to see how you have responded to the transfusion. You may be warmed with fluids or blankets to maintain a normal body temperature. If you receive your blood transfusion in an outpatient setting, you will be told whom to contact to report any reactions. Where to find more information Visit the [employer]: redcross.org Summary A blood transfusion is a procedure in which you receive blood or a type of blood cell (blood component) through an IV. The blood given in a transfusion may be made up of different blood components. You may receive red blood cells, platelets, plasma, or white blood cells depending on the condition treated. Your temperature, blood pressure, and pulse will be monitored before, during, and after the transfusion. After the transfusion, your blood may be tested to see how your body has responded. This information is not intended to replace advice given to you by your health care provider. Make sure you discuss any questions you have with your health care provider. Document Revised: 12/19/2021 Document Reviewed: 12/19/2021 Elsevier Patient Education  2024 [employer].

## 2024-04-05 ENCOUNTER — Encounter: Payer: Self-pay | Admitting: Internal Medicine

## 2024-04-05 MED ORDER — GABAPENTIN 100 MG PO CAPS
100.0000 mg | ORAL_CAPSULE | Freq: Two times a day (BID) | ORAL | 3 refills | Status: DC
  Filled 2024-04-05: qty 60, 30d supply, fill #0

## 2024-04-05 NOTE — Progress Notes (Signed)
 Palliative Medicine [facility]  [phone] [phone]) [phone]   Name: Duan Scharnhorst Date: 04/05/2024 MRN: [id number]  DOB: [birth year]  Patient Care Team: [facility], [facility] as PCP - General Bouchard, Duwaine BROCKS, RN as Oncology Nurse Navigator Pickenpack-Cousar, Fannie SAILOR, NP as Nurse Practitioner (Hospice and Palliative Medicine)    INTERVAL HISTORY: Antoni Stefan is a 63 y.o. 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  Palliative is seeing patient for symptom management and goals of care.   SOCIAL HISTORY:     reports that he quit smoking about 9 years ago. His smoking use included e-cigarettes. He has never used smokeless tobacco. He reports current alcohol use of about 6.0 standard drinks of alcohol per week. He reports that he does not use drugs.  ADVANCE DIRECTIVES:  None on file   CODE STATUS: Full code  PAST MEDICAL HISTORY: Past Medical History:  Diagnosis Date   Allergy    Anxiety    Arthritis    Blood transfusion without reported diagnosis    Cataract    Depression    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   Prostate cancer (HCC)     ALLERGIES:  is allergic to breyna  [budesonide -formoterol  fumarate].  MEDICATIONS:  Current Outpatient Medications  Medication Sig Dispense Refill   magic mouthwash (nystatin, diphenhydrAMINE , alum & mag hydroxide) suspension mixture Swish and swallow 10 mLs 4 (four) times daily as needed for mouth pain. 240 mL 1   acetaminophen  (TYLENOL ) 500 MG tablet Take 2 tablets (1,000 mg total) by mouth every 8 (eight) hours.     albuterol  (VENTOLIN  HFA) 108 (90 Base) MCG/ACT inhaler Inhale 1 puff into the lungs every 6 (six) hours as needed for wheezing or shortness of breath.     benzonatate  (TESSALON ) 200 MG capsule Take 1 capsule (200 mg total) by mouth 3 (three) times daily. 90  capsule 1   enoxaparin  (LOVENOX ) 60 MG/0.6ML injection Inject 0.6 mLs (60 mg total) into the skin 2 (two) times daily. 36 mL 0   fenofibrate  160 MG tablet Take 160 mg by mouth daily.     ferrous sulfate 325 (65 FE) MG tablet Take 325 mg by mouth daily with breakfast.     HYDROcodone  bit-homatropine (HYCODAN) 5-1.5 MG/5ML syrup Take 5 mLs by mouth every 8 (eight) hours as needed for cough. 120 mL 0   megestrol  (MEGACE ) 20 MG tablet Take 40 mg by mouth 4 (four) times daily -  with meals and at bedtime.     ondansetron  (ZOFRAN ) 8 MG tablet Take 0.5 tablets (4 mg total) by mouth every 8 (eight) hours as needed for nausea. 45 tablet 2   OVER THE COUNTER MEDICATION Apply 1 Application topically daily. Topical cream with unknown active ingredient/strength. Sample given by oncologist in office for daily use on radiation burn.     oxyCODONE  (OXYCONTIN ) 10 mg 12 hr tablet Take 1 tablet (10 mg total) by mouth every 8 (eight) hours.     oxyCODONE  (ROXICODONE ) 15 MG immediate release tablet Take 1 tablet (15 mg total) by mouth every 4 (four) hours as needed. 90 tablet 0   pantoprazole  (PROTONIX ) 40 MG tablet Take 1 tablet (40 mg total) by mouth daily. 30 tablet 3   pembrolizumab  (KEYTRUDA ) 100 MG/4ML SOLN Inject 200 mg into the vein every 21 ( twenty-one) days.     polyethylene  glycol powder (GLYCOLAX /MIRALAX ) 17 GM/SCOOP powder Take 17 g by mouth daily. 476 g 1   rosuvastatin  (CRESTOR ) 10 MG tablet Take 10 mg by mouth daily.     senna-docusate (SENOKOT-S) 8.6-50 MG tablet Take 2 tablets by mouth at bedtime. 120 tablet 0   sucralfate  (CARAFATE ) 1 g tablet Take 1 tablet (1 g total) by mouth 4 (four) times daily -  with meals and at bedtime. 120 tablet 1   No current facility-administered medications for this visit.    VITAL SIGNS: There were no vitals taken for this visit. There were no vitals filed for this visit.   Estimated body mass index is 18.22 kg/m as calculated from the following:   Height as of  03/28/24: 5' 7 (1.702 m).   Weight as of 04/03/24: 116 lb 4.8 oz (52.8 kg).   PERFORMANCE STATUS (ECOG) : 1 - Symptomatic but completely ambulatory  Physical Exam HENT:     Mouth/Throat:     Pharynx: Oropharynx is clear.    General: NAD Cardiovascular: regular rate and rhythm Pulmonary: normal breathing pattern Extremities: no edema, no joint deformities Skin: no rashes Neurological: AAO x3  IMPRESSION: Discussed the use of AI scribe software for clinical note transcription with the patient, who gave verbal consent to proceed.  History of Present Illness Roshad Hack is a 63 year old male with non-small cell lung cancer who presents to clinic today for symptom management follow-up. He is accompanied by his daughter, wife engaged via [employer].  Patient complains of persistent cough and decrease in appetite due to throat discomfort. Some days are better than others. Weight is down to 116lbs from 120lbs.    He has a persistent cough that has not improved with current treatments, including Hycodan and nebulizer treatments, which exacerbated the cough. The cough is described as nagging and frustrating, worsened by radiation treatment to his neck.   He experiences significant throat pain, making it difficult to drink water. The pain is severe and impacts his ability to eat, deterring him despite hunger. He manages with soft and cold foods like chicken noodle soup, ice cream, and applesauce, which are tolerable. Swallowing is difficult, and water feels like a 'ball' in his throat, causing significant discomfort. On exam noted to have some oral thrush. Education provided on oral care and use of magic mouthwash. We discussed deferring use of lidocaine  due to risk of aspiration due to his uncontrolled coughing episodes.   His appetite is poor due to throat pain, and he has difficulty swallowing multiple pills at a time, taking them one at a time every 2-3 minutes. His appetite is present, but the  pain associated with eating is a deterrent. We discussed use of pudding or applesauce to prevent pills from feeling as though they are getting stuck. Patient and family verbalized understanding. Patient reports feelings that he can tolerate current regimen with no changes to route.   We discussed Mr. Decarlo's pain at length. He is currently on OxyContin  for pain management, adjusted from every twelve hours to every eight hours, which has been beneficial. He also mentions previous use of Dilaudid  while hospitalized, which was effective. He reports his overall regimen is appropriate and controlling his pain despite throat discomfort. He is much appreciative of this. No changes at this time.   He experiences depression, attributed to the cumulative effects of his symptoms, including persistent cough, throat pain, and difficulty eating. He is on medication for depression, managed by his primary care doctor at  [name]. Mr. Minotti states he has noticed an increase in his depression as it relates to his overall cancer diagnosis and physiological changes and symptoms. Emotional support provided. Encouraged patient to contact PCP for possible medical adjustments.   All questions answered and support provided.   I discussed the importance of continued conversation with family and their medical providers regarding overall plan of care and treatment options, ensuring decisions are within the context of the patients values and GOCs. Assessment & Plan Pain management Cancer related pain much improved on current regimen. Patient states as long as medications are taken as scheduled he is comfortable despite throat pain.  - Continue OxyContin  to every eight hours with two extra strength acetaminophen  (500 mg each). - Continue oxycodone  15 mg every four hours as needed.  Constipation Constipation improved.  - Continue senna at bedtime.  -Continue daily Miralax    Dysphagia Difficulty swallowing due to throat  soreness and inflammation, possibly related to thrush and radiation therapy. Painful swallowing, especially with water. - Encourage cold and soft foods like ice cream, applesauce, and chicken noodle soup. - Consider using applesauce or pudding to help swallow medications. - Discuss potential feeding tube placement if swallowing becomes impossible.  Oral thrush White patches and coating on the tongue and back of the mouth, consistent with oral thrush, likely related to radiation therapy. - Prescribe Magic Mouthwash containing Maalox and nystatin. Swish and swallow up to four times a day. If unable to swallow, swish and spit.  Cough related to radiation therapy Persistent cough likely related to radiation therapy in the neck area. Nebulizer treatment worsened symptoms. Lidocaine  not recommended due to aspiration risk. - Continue current management strategies as cough medicine and nebulizer are ineffective. - Avoid lidocaine  due to aspiration risk. -Continue use of hycodan cough syrup.  -Gabapentin 100mg  twice daily for ideopathic cough and pain.   Depression Depression exacerbated by physical symptoms and pain. Managed by primary care physician at [facility]. - Continue current depression medication as prescribed by primary care physician. Patient expressed understanding and was in agreement with this plan. He also understands that He can call the clinic at any time with any questions, concerns, or complaints.   Goals of Care Discussed symptom management to improve quality of life, emphasizing medication timing. - Patient is clear in expressed wishes to continue to treat the treatable allowing him every opportunity to thrive.  - Ensure medications are taken at appropriate intervals to avoid excessive drowsiness.  Follow-up Regular follow-up every three to four weeks unless issues arise. - Call for prescription refills three to four days before depletion, preferably on Wednesday or  early Thursday.  Any controlled substances utilized were prescribed in the context of palliative care. PDMP has been reviewed.   Visit consisted of counseling and education dealing with the complex and emotionally intense issues of symptom management and palliative care in the setting of serious and potentially life-threatening illness.  Levon Borer, AGPCNP-BC  Palliative Medicine Team/[facility]

## 2024-04-06 ENCOUNTER — Other Ambulatory Visit (HOSPITAL_COMMUNITY): Payer: Self-pay

## 2024-04-06 ENCOUNTER — Ambulatory Visit

## 2024-04-06 ENCOUNTER — Ambulatory Visit: Admitting: Internal Medicine

## 2024-04-06 ENCOUNTER — Other Ambulatory Visit

## 2024-04-06 VITALS — BP 93/60 | HR 113 | Temp 97.6°F | Resp 18 | Ht 67.0 in | Wt 115.5 lb

## 2024-04-06 DIAGNOSIS — Z515 Encounter for palliative care: Secondary | ICD-10-CM

## 2024-04-06 DIAGNOSIS — Z5112 Encounter for antineoplastic immunotherapy: Secondary | ICD-10-CM | POA: Diagnosis not present

## 2024-04-06 DIAGNOSIS — D649 Anemia, unspecified: Secondary | ICD-10-CM

## 2024-04-06 DIAGNOSIS — G893 Neoplasm related pain (acute) (chronic): Secondary | ICD-10-CM

## 2024-04-06 MED ORDER — DIPHENHYDRAMINE HCL 25 MG PO CAPS
25.0000 mg | ORAL_CAPSULE | Freq: Once | ORAL | Status: AC
  Administered 2024-04-06: 25 mg via ORAL
  Filled 2024-04-06: qty 1

## 2024-04-06 MED ORDER — SODIUM CHLORIDE 0.9% IV SOLUTION
250.0000 mL | INTRAVENOUS | Status: DC
  Administered 2024-04-06: 100 mL via INTRAVENOUS

## 2024-04-06 MED ORDER — OXYCODONE HCL 5 MG PO TABS
15.0000 mg | ORAL_TABLET | Freq: Once | ORAL | Status: AC
  Administered 2024-04-06: 15 mg via ORAL
  Filled 2024-04-06: qty 3

## 2024-04-06 MED ORDER — ACETAMINOPHEN 325 MG PO TABS
650.0000 mg | ORAL_TABLET | Freq: Once | ORAL | Status: DC
  Filled 2024-04-06: qty 2

## 2024-04-06 NOTE — Patient Instructions (Signed)

## 2024-04-09 ENCOUNTER — Other Ambulatory Visit: Payer: Self-pay

## 2024-04-09 ENCOUNTER — Inpatient Hospital Stay (HOSPITAL_COMMUNITY)
Admission: EM | Admit: 2024-04-09 | Discharge: 2024-04-15 | DRG: 922 | Disposition: A | Discharge status: Home / Self Care | Attending: Internal Medicine | Admitting: Internal Medicine

## 2024-04-09 ENCOUNTER — Encounter (HOSPITAL_COMMUNITY): Payer: Self-pay | Admitting: Internal Medicine

## 2024-04-09 ENCOUNTER — Emergency Department (HOSPITAL_COMMUNITY)

## 2024-04-09 DIAGNOSIS — G893 Neoplasm related pain (acute) (chronic): Secondary | ICD-10-CM | POA: Diagnosis present

## 2024-04-09 DIAGNOSIS — Z79899 Other long term (current) drug therapy: Secondary | ICD-10-CM

## 2024-04-09 DIAGNOSIS — C779 Secondary and unspecified malignant neoplasm of lymph node, unspecified: Secondary | ICD-10-CM | POA: Diagnosis present

## 2024-04-09 DIAGNOSIS — K209 Esophagitis, unspecified without bleeding: Secondary | ICD-10-CM

## 2024-04-09 DIAGNOSIS — C3411 Malignant neoplasm of upper lobe, right bronchus or lung: Secondary | ICD-10-CM | POA: Diagnosis present

## 2024-04-09 DIAGNOSIS — Z86718 Personal history of other venous thrombosis and embolism: Secondary | ICD-10-CM

## 2024-04-09 DIAGNOSIS — Z87891 Personal history of nicotine dependence: Secondary | ICD-10-CM

## 2024-04-09 DIAGNOSIS — Z515 Encounter for palliative care: Secondary | ICD-10-CM

## 2024-04-09 DIAGNOSIS — Z681 Body mass index (BMI) 19 or less, adult: Secondary | ICD-10-CM

## 2024-04-09 DIAGNOSIS — Z832 Family history of diseases of the blood and blood-forming organs and certain disorders involving the immune mechanism: Secondary | ICD-10-CM

## 2024-04-09 DIAGNOSIS — C3490 Malignant neoplasm of unspecified part of unspecified bronchus or lung: Secondary | ICD-10-CM

## 2024-04-09 DIAGNOSIS — Z811 Family history of alcohol abuse and dependence: Secondary | ICD-10-CM

## 2024-04-09 DIAGNOSIS — K3189 Other diseases of stomach and duodenum: Secondary | ICD-10-CM | POA: Diagnosis present

## 2024-04-09 DIAGNOSIS — K449 Diaphragmatic hernia without obstruction or gangrene: Secondary | ICD-10-CM | POA: Diagnosis present

## 2024-04-09 DIAGNOSIS — E43 Unspecified severe protein-calorie malnutrition: Secondary | ICD-10-CM | POA: Diagnosis present

## 2024-04-09 DIAGNOSIS — R627 Adult failure to thrive: Secondary | ICD-10-CM | POA: Diagnosis present

## 2024-04-09 DIAGNOSIS — E785 Hyperlipidemia, unspecified: Secondary | ICD-10-CM | POA: Diagnosis present

## 2024-04-09 DIAGNOSIS — J449 Chronic obstructive pulmonary disease, unspecified: Secondary | ICD-10-CM | POA: Diagnosis present

## 2024-04-09 DIAGNOSIS — T66XXXA Radiation sickness, unspecified, initial encounter: Principal | ICD-10-CM | POA: Diagnosis present

## 2024-04-09 DIAGNOSIS — Z7901 Long term (current) use of anticoagulants: Secondary | ICD-10-CM

## 2024-04-09 DIAGNOSIS — R64 Cachexia: Secondary | ICD-10-CM | POA: Diagnosis present

## 2024-04-09 DIAGNOSIS — Z8249 Family history of ischemic heart disease and other diseases of the circulatory system: Secondary | ICD-10-CM

## 2024-04-09 DIAGNOSIS — K2289 Other specified disease of esophagus: Secondary | ICD-10-CM | POA: Diagnosis present

## 2024-04-09 DIAGNOSIS — R638 Other symptoms and signs concerning food and fluid intake: Secondary | ICD-10-CM

## 2024-04-09 DIAGNOSIS — Z923 Personal history of irradiation: Secondary | ICD-10-CM

## 2024-04-09 DIAGNOSIS — Z9079 Acquired absence of other genital organ(s): Secondary | ICD-10-CM

## 2024-04-09 DIAGNOSIS — K5900 Constipation, unspecified: Secondary | ICD-10-CM | POA: Diagnosis present

## 2024-04-09 DIAGNOSIS — E860 Dehydration: Principal | ICD-10-CM | POA: Diagnosis present

## 2024-04-09 DIAGNOSIS — Z8619 Personal history of other infectious and parasitic diseases: Secondary | ICD-10-CM

## 2024-04-09 DIAGNOSIS — R52 Pain, unspecified: Principal | ICD-10-CM

## 2024-04-09 DIAGNOSIS — R1310 Dysphagia, unspecified: Secondary | ICD-10-CM | POA: Diagnosis present

## 2024-04-09 DIAGNOSIS — Z86711 Personal history of pulmonary embolism: Secondary | ICD-10-CM

## 2024-04-09 DIAGNOSIS — Z85118 Personal history of other malignant neoplasm of bronchus and lung: Secondary | ICD-10-CM

## 2024-04-09 DIAGNOSIS — D509 Iron deficiency anemia, unspecified: Secondary | ICD-10-CM | POA: Diagnosis present

## 2024-04-09 DIAGNOSIS — E876 Hypokalemia: Secondary | ICD-10-CM | POA: Diagnosis present

## 2024-04-09 DIAGNOSIS — K208 Other esophagitis without bleeding: Secondary | ICD-10-CM | POA: Diagnosis present

## 2024-04-09 DIAGNOSIS — C7951 Secondary malignant neoplasm of bone: Secondary | ICD-10-CM | POA: Diagnosis present

## 2024-04-09 DIAGNOSIS — G894 Chronic pain syndrome: Secondary | ICD-10-CM | POA: Diagnosis present

## 2024-04-09 DIAGNOSIS — Z8546 Personal history of malignant neoplasm of prostate: Secondary | ICD-10-CM

## 2024-04-09 LAB — BASIC METABOLIC PANEL WITH GFR
Anion gap: 13 (ref 5–15)
BUN: 17 mg/dL (ref 8–23)
CO2: 23 mmol/L (ref 22–32)
Calcium: 8.3 mg/dL — ABNORMAL LOW (ref 8.9–10.3)
Chloride: 103 mmol/L (ref 98–111)
Creatinine, Ser: 0.7 mg/dL (ref 0.61–1.24)
GFR, Estimated: >60 mL/min (ref >60)
Glucose, Bld: 94 mg/dL (ref 70–99)
Potassium: 3.5 mmol/L (ref 3.5–5.1)
Sodium: 139 mmol/L (ref 135–145)

## 2024-04-09 LAB — BRAIN NATRIURETIC PEPTIDE: B Natriuretic Peptide: 62.7 pg/mL (ref 0.0–100.0)

## 2024-04-09 LAB — CBC WITH DIFFERENTIAL/PLATELET
Abs Immature Granulocytes: 0.08 K/uL — ABNORMAL HIGH (ref 0.00–0.07)
Basophils Absolute: 0 K/uL (ref 0.0–0.1)
Basophils Relative: 0 %
Eosinophils Absolute: 0 K/uL (ref 0.0–0.5)
Eosinophils Relative: 0 %
HCT: 30.6 % — ABNORMAL LOW (ref 39.0–52.0)
Hemoglobin: 9.4 g/dL — ABNORMAL LOW (ref 13.0–17.0)
Immature Granulocytes: 1 %
Lymphocytes Relative: 7 %
Lymphs Abs: 0.6 K/uL — ABNORMAL LOW (ref 0.7–4.0)
MCH: 24.9 pg — ABNORMAL LOW (ref 26.0–34.0)
MCHC: 30.7 g/dL (ref 30.0–36.0)
MCV: 81 fL (ref 80.0–100.0)
Monocytes Absolute: 0.6 K/uL (ref 0.1–1.0)
Monocytes Relative: 6 %
Neutro Abs: 7.4 K/uL (ref 1.7–7.7)
Neutrophils Relative %: 86 %
Platelets: 251 K/uL (ref 150–400)
RBC: 3.78 MIL/uL — ABNORMAL LOW (ref 4.22–5.81)
RDW: 21.5 % — ABNORMAL HIGH (ref 11.5–15.5)
WBC: 8.7 K/uL (ref 4.0–10.5)
nRBC: 0 % (ref 0.0–0.2)

## 2024-04-09 LAB — TYPE AND SCREEN
ABO/RH(D): A POS
Antibody Screen: POSITIVE

## 2024-04-09 MED ORDER — ENOXAPARIN SODIUM 60 MG/0.6ML IJ SOSY
60.0000 mg | PREFILLED_SYRINGE | Freq: Two times a day (BID) | INTRAMUSCULAR | Status: DC
  Administered 2024-04-09 – 2024-04-10 (×2): 60 mg via SUBCUTANEOUS
  Filled 2024-04-09 (×2): qty 0.6

## 2024-04-09 MED ORDER — SODIUM CHLORIDE 0.9 % IV SOLN: Freq: Once | INTRAVENOUS | Status: AC

## 2024-04-09 MED ORDER — SODIUM CHLORIDE 0.9 % IV BOLUS
1000.0000 mL | Freq: Once | INTRAVENOUS | Status: AC
  Administered 2024-04-09: 1000 mL via INTRAVENOUS

## 2024-04-09 MED ORDER — HYDROMORPHONE HCL 1 MG/ML IJ SOLN
1.0000 mg | INTRAMUSCULAR | Status: DC | PRN
  Administered 2024-04-09 – 2024-04-14 (×14): 1 mg via INTRAVENOUS
  Filled 2024-04-09 (×17): qty 1

## 2024-04-09 MED ORDER — NYSTATIN 100000 UNIT/ML MT SUSP: 10.0000 mL | Freq: Four times a day (QID) | OROMUCOSAL | Status: DC | PRN

## 2024-04-09 MED ORDER — ONDANSETRON HCL 4 MG/2ML IJ SOLN
4.0000 mg | Freq: Four times a day (QID) | INTRAMUSCULAR | Status: DC | PRN
  Administered 2024-04-10 – 2024-04-12 (×2): 4 mg via INTRAVENOUS
  Filled 2024-04-09 (×2): qty 2

## 2024-04-09 MED ORDER — OXYCODONE HCL ER 10 MG PO T12A
10.0000 mg | EXTENDED_RELEASE_TABLET | Freq: Three times a day (TID) | ORAL | Status: DC
  Administered 2024-04-09 – 2024-04-15 (×17): 10 mg via ORAL
  Filled 2024-04-09 (×17): qty 1

## 2024-04-09 MED ORDER — ONDANSETRON HCL 4 MG PO TABS
4.0000 mg | ORAL_TABLET | Freq: Four times a day (QID) | ORAL | Status: DC | PRN
Start: 2024-04-09 — End: 2024-04-15

## 2024-04-09 MED ORDER — ACETAMINOPHEN 650 MG RE SUPP: 650.0000 mg | Freq: Four times a day (QID) | RECTAL | Status: DC | PRN

## 2024-04-09 MED ORDER — MEGESTROL ACETATE 40 MG PO TABS
40.0000 mg | ORAL_TABLET | Freq: Three times a day (TID) | ORAL | Status: DC
  Administered 2024-04-09 – 2024-04-15 (×20): 40 mg via ORAL
  Filled 2024-04-09 (×23): qty 1

## 2024-04-09 MED ORDER — ACETAMINOPHEN 325 MG PO TABS
650.0000 mg | ORAL_TABLET | Freq: Four times a day (QID) | ORAL | Status: DC | PRN
  Administered 2024-04-09 – 2024-04-13 (×2): 650 mg via ORAL
  Filled 2024-04-09 (×3): qty 2

## 2024-04-09 MED ORDER — HYDROMORPHONE HCL 1 MG/ML IJ SOLN: 0.5000 mg | INTRAMUSCULAR | Status: DC | PRN

## 2024-04-09 MED ORDER — HYDROCODONE BIT-HOMATROP MBR 5-1.5 MG/5ML PO SOLN
5.0000 mL | Freq: Three times a day (TID) | ORAL | Status: DC | PRN
  Administered 2024-04-14 – 2024-04-15 (×2): 5 mL via ORAL
  Filled 2024-04-09 (×2): qty 5

## 2024-04-09 MED ORDER — SUCRALFATE 1 G PO TABS
1.0000 g | ORAL_TABLET | Freq: Three times a day (TID) | ORAL | Status: DC
  Administered 2024-04-09 – 2024-04-11 (×9): 1 g via ORAL
  Filled 2024-04-09 (×9): qty 1

## 2024-04-09 MED ORDER — SENNOSIDES-DOCUSATE SODIUM 8.6-50 MG PO TABS
2.0000 | ORAL_TABLET | Freq: Every day | ORAL | Status: DC
  Administered 2024-04-09 – 2024-04-13 (×5): 2 via ORAL
  Filled 2024-04-09 (×5): qty 2

## 2024-04-09 MED ORDER — ENOXAPARIN SODIUM 60 MG/0.6ML IJ SOSY
60.0000 mg | PREFILLED_SYRINGE | Freq: Once | INTRAMUSCULAR | Status: AC
  Administered 2024-04-09: 60 mg via SUBCUTANEOUS
  Filled 2024-04-09: qty 0.6

## 2024-04-09 MED ORDER — BENZONATATE 100 MG PO CAPS
200.0000 mg | ORAL_CAPSULE | Freq: Three times a day (TID) | ORAL | Status: DC
  Administered 2024-04-09 – 2024-04-15 (×16): 200 mg via ORAL
  Filled 2024-04-09 (×18): qty 2

## 2024-04-09 MED ORDER — HYDROMORPHONE HCL 1 MG/ML IJ SOLN
1.0000 mg | Freq: Once | INTRAMUSCULAR | Status: AC
  Administered 2024-04-09: 1 mg via INTRAVENOUS
  Filled 2024-04-09: qty 1

## 2024-04-09 MED ORDER — LACTATED RINGERS IV BOLUS
1000.0000 mL | Freq: Once | INTRAVENOUS | Status: AC
  Administered 2024-04-09: 1000 mL via INTRAVENOUS

## 2024-04-09 MED ORDER — HYDROMORPHONE HCL 1 MG/ML IJ SOLN: 0.5000 mg | Freq: Once | INTRAMUSCULAR | Status: DC

## 2024-04-09 MED ORDER — ONDANSETRON HCL 4 MG/2ML IJ SOLN
4.0000 mg | Freq: Once | INTRAMUSCULAR | Status: AC
  Administered 2024-04-09: 4 mg via INTRAVENOUS
  Filled 2024-04-09: qty 2

## 2024-04-09 MED ORDER — OXYCODONE HCL 5 MG PO TABS: 15.0000 mg | ORAL_TABLET | ORAL | Status: DC | PRN

## 2024-04-09 MED ORDER — ALBUTEROL SULFATE (2.5 MG/3ML) 0.083% IN NEBU: 2.5000 mg | INHALATION_SOLUTION | RESPIRATORY_TRACT | Status: DC | PRN

## 2024-04-09 MED ORDER — POLYETHYLENE GLYCOL 3350 17 G PO PACK
17.0000 g | PACK | Freq: Every day | ORAL | Status: DC
  Administered 2024-04-10 – 2024-04-15 (×4): 17 g via ORAL
  Filled 2024-04-09 (×5): qty 1

## 2024-04-09 MED ORDER — TRAZODONE HCL 50 MG PO TABS
25.0000 mg | ORAL_TABLET | Freq: Every evening | ORAL | Status: DC | PRN
Start: 2024-04-09 — End: 2024-04-15
  Administered 2024-04-12: 25 mg via ORAL
  Filled 2024-04-09: qty 1

## 2024-04-09 MED ORDER — MAGIC MOUTHWASH: 10.0000 mL | Freq: Four times a day (QID) | ORAL | Status: DC | PRN

## 2024-04-09 NOTE — ED Triage Notes (Signed)
 Pt has not been able to eat or drink due to sore throat. Pt is a stage 4 lung cancer patient. Pt currently under immuno therapy. Pt is alert and oriented x4.

## 2024-04-09 NOTE — ED Provider Notes (Signed)
 [place] [facility] Provider Note   CSN: [id number] Arrival date & time: 04/09/24  1112     Patient presents with: Dehydration   Jeff Romero is a 63 y.o. male.   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  Pt has worsening throat pain and has been having a difficult time swallowing.  His wife said he's eaten very little.  Pt's wife said his pain has been severe.  He has lost even more weight.         Prior to Admission medications   Medication Sig Start Date End Date Taking? Authorizing Provider  acetaminophen  (TYLENOL ) 500 MG tablet Take 2 tablets (1,000 mg total) by mouth every 8 (eight) hours. 02/15/24  Yes Ghimire, Donalda HERO, MD  albuterol  (VENTOLIN  HFA) 108 [phone] Base) MCG/ACT inhaler Inhale 1 puff into the lungs every 6 (six) hours as needed for wheezing or shortness of breath. 12/23/23  Yes [provider]  benzonatate  (TESSALON ) 200 MG capsule Take 1 capsule (200 mg total) by mouth 3 (three) times daily. 03/28/24  Yes Pickenpack-Cousar, Fannie SAILOR, NP  enoxaparin  (LOVENOX ) 60 MG/0.6ML injection Inject 0.6 mLs (60 mg total) into the skin 2 (two) times daily. 03/18/24 04/17/24 Yes Chatterjee, Srobona Tublu, MD  fenofibrate  160 MG tablet Take 160 mg by mouth daily.   Yes [provider]  ferrous sulfate 325 (65 FE) MG tablet Take 325 mg by mouth daily with breakfast.   Yes [provider]  HYDROcodone  bit-homatropine (HYCODAN) 5-1.5 MG/5ML syrup Take 5 mLs by mouth every 8 (eight) hours as needed for cough. 03/28/24  Yes Pickenpack-Cousar, Fannie SAILOR, NP  magic mouthwash (nystatin , diphenhydrAMINE , alum & mag hydroxide) suspension mixture Swish and swallow 10 mLs 4  (four) times daily as needed for mouth pain. 04/04/24  Yes Pickenpack-Cousar, Fannie SAILOR, NP  megestrol  (MEGACE ) 20 MG tablet Take 40 mg by mouth 4 (four) times daily -  with meals and at bedtime. 02/21/24  Yes [provider]  ondansetron  (ZOFRAN ) 8 MG tablet Take 0.5 tablets (4 mg total) by mouth every 8 (eight) hours as needed for nausea. 03/28/24  Yes Pickenpack-Cousar, Fannie SAILOR, NP  OVER THE COUNTER MEDICATION Apply 1 Application topically daily. Topical cream with unknown active ingredient/strength. Sample given by oncologist in office for daily use on radiation burn.   Yes [provider]  oxyCODONE  (OXYCONTIN ) 10 mg 12 hr tablet Take 1 tablet (10 mg total) by mouth every 8 (eight) hours. 03/28/24  Yes Pickenpack-Cousar, Fannie SAILOR, NP  oxyCODONE  (ROXICODONE ) 15 MG immediate release tablet Take 1 tablet (15 mg total) by mouth every 4 (four) hours as needed. 03/28/24  Yes Pickenpack-Cousar, Fannie SAILOR, NP  pantoprazole  (PROTONIX ) 40 MG tablet Take 1 tablet (40 mg total) by mouth daily. 03/14/24  Yes Pickenpack-Cousar, Fannie SAILOR, NP  pembrolizumab  (KEYTRUDA ) 100 MG/4ML SOLN Inject 200 mg into the vein every 21 ( twenty-one) days.   Yes [provider]  polyethylene glycol powder (GLYCOLAX /MIRALAX ) 17 GM/SCOOP powder Take 17 g by mouth daily. 02/15/24  Yes Ghimire, Donalda HERO, MD  rosuvastatin  (CRESTOR ) 10 MG tablet Take 10 mg by mouth daily. 12/23/23  Yes [provider]  senna-docusate (SENOKOT-S) 8.6-50 MG tablet  Take 2 tablets by mouth at bedtime. 02/15/24  Yes Ghimire, Donalda HERO, MD  sucralfate  (CARAFATE ) 1 g tablet Take 1 tablet (1 g total) by mouth 4 (four) times daily -  with meals and at bedtime. 03/01/24  Yes Sherrod Sherrod, MD  gabapentin  (NEURONTIN ) 100 MG capsule Take 1 capsule (100 mg total) by mouth 2 (two) times daily. Patient not taking: Reported on 04/09/2024 04/05/24   Pickenpack-Cousar, Fannie SAILOR, NP    Allergies: Breyna  [budesonide -formoterol  fumarate]     Review of Systems  HENT:         Sore throat  Respiratory:  Positive for shortness of breath.   All other systems reviewed and are negative.   Updated Vital Signs BP 127/72   Pulse (!) 122   Temp 98 F (36.7 C) (Oral)   Resp (!) 24   SpO2 95%   Physical Exam Vitals and nursing note reviewed.  Constitutional:      Appearance: He is cachectic.     Comments: Pt looks uncomfortable  HENT:     Head: Normocephalic and atraumatic.     Right Ear: External ear normal.     Left Ear: External ear normal.     Nose: Nose normal.     Mouth/Throat:     Mouth: Mucous membranes are dry.  Eyes:     Extraocular Movements: Extraocular movements intact.     Conjunctiva/sclera: Conjunctivae normal.     Pupils: Pupils are equal, round, and reactive to light.  Cardiovascular:     Rate and Rhythm: Regular rhythm. Tachycardia present.     Pulses: Normal pulses.     Heart sounds: Normal heart sounds.  Pulmonary:     Effort: Pulmonary effort is normal.     Breath sounds: Normal breath sounds.  Abdominal:     General: Abdomen is flat. Bowel sounds are normal.     Palpations: Abdomen is soft.  Musculoskeletal:        General: Normal range of motion.     Cervical back: Normal range of motion and neck supple.  Skin:    General: Skin is warm.     Capillary Refill: Capillary refill takes less than 2 seconds.  Neurological:     General: No focal deficit present.     Mental Status: He is alert and oriented to person, place, and time.  Psychiatric:        Mood and Affect: Mood normal.        Behavior: Behavior normal.     (all labs ordered are listed, but only abnormal results are displayed) Labs Reviewed  BASIC METABOLIC PANEL WITH GFR - Abnormal; Notable for the following components:      Result Value   Calcium  8.3 (*)    All other components within normal limits  CBC WITH DIFFERENTIAL/PLATELET - Abnormal; Notable for the following components:   RBC 3.78 (*)    Hemoglobin 9.4 (*)     HCT 30.6 (*)    MCH 24.9 (*)    RDW 21.5 (*)    Lymphs Abs 0.6 (*)    Abs Immature Granulocytes 0.08 (*)    All other components within normal limits  BRAIN NATRIURETIC PEPTIDE  TYPE AND SCREEN    EKG: None  Radiology: [facility] Chest Port 1 View Result Date: 04/09/2024 CLINICAL DATA:  Short of breath stage IV lung cancer EXAM: PORTABLE CHEST 1 VIEW COMPARISON:  Radiograph 03/21/2024, CT 03/15/2024. FINDINGS: Normal cardiac silhouette. Significant interval increase in density of RIGHT upper lobe  mass. Nodule in the superior segment of LEFT lower lobe also appears increased. No pneumothorax. No pleural fluid. No acute osseous abnormality. IMPRESSION: 1. Increased masslike consolidation in the RIGHT upper lobe. Differential includes progressive pulmonary infection versus progressive neoplasm versus a combination of both. 2. Increase in volume of LEFT lobe nodule suggests progressive lung cancer. Electronically Signed   By: Jackquline Boxer M.D.   On: 04/09/2024 12:51     Procedures   Medications Ordered in the ED  sodium chloride  0.9 % bolus 1,000 mL (has no administration in time range)  enoxaparin  (LOVENOX ) injection 60 mg (has no administration in time range)  sodium chloride  0.9 % bolus 1,000 mL (1,000 mLs Intravenous New Bag/Given 04/09/24 1221)  HYDROmorphone  (DILAUDID ) injection 1 mg (1 mg Intravenous Given 04/09/24 1223)  ondansetron  (ZOFRAN ) injection 4 mg (4 mg Intravenous Given 04/09/24 1222)                                    Medical Decision Making Amount and/or Complexity of Data Reviewed Labs: ordered. Radiology: ordered.  Risk Prescription drug management. Decision regarding hospitalization.   This patient presents to the ED for concern of pain, this involves an extensive number of treatment options, and is a complaint that carries with it a high risk of complications and morbidity.  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  The pertinent results include:  cbc with hgb 9.4 (improved after recent blood transfusion); bmp nl; bnp nl   Imaging Studies ordered:  I ordered imaging studies including cxr  I independently visualized and interpreted imaging which showed   Increased masslike consolidation in the RIGHT upper lobe.  Differential includes progressive pulmonary infection versus  progressive neoplasm versus a combination of both.  2. Increase in volume of LEFT lobe nodule suggests progressive lung  cancer.   I agree with the radiologist interpretation   Cardiac Monitoring:  The patient was maintained on a cardiac monitor.  I personally viewed and interpreted the cardiac monitored which showed an underlying rhythm of: st   Medicines ordered and prescription drug management:  I ordered medication including ivfs/dilaudid   for sx  Reevaluation of the patient after these medicines showed that the patient improved I have reviewed the patients home medicines and have made adjustments as needed   Test Considered:  ct   Critical Interventions:  Pain control   Consultations Obtained:  I requested consultation with the hospitalist (Dr. Zella),  and discussed lab and imaging findings as well as pertinent plan -he will admit   Problem List / ED Course:  Intractable pain:  pt is feeling better after iv dilaudid , but needs admission as his pain at  home is not under control. Dehydration:  pt has had poor oral intake due to pain when he swallows.  IVFs given. Hx DVT and EJ thrombus:  pt had not had his am  dose of Lovenox  yet, so he's given a dose here in the ED   Reevaluation:  After the interventions noted above, I reevaluated the patient and found that they have :improved   Social Determinants of Health:  Lives at home   Dispostion:  After consideration of the diagnostic results and the patients response to treatment, I feel that the patent would benefit from admission.       Final diagnoses:  Intractable pain  Metastatic non-small cell lung cancer [facility])  Dehydration    ED Discharge Orders     None          Dean Clarity, MD 04/09/24 1353

## 2024-04-09 NOTE — Plan of Care (Signed)
  Problem: Education: Goal: Knowledge of General Education information will improve Description: Including pain rating scale, medication(s)/side effects and non-pharmacologic comfort measures Outcome: Progressing   Problem: Elimination: Goal: Will not experience complications related to urinary retention Outcome: Progressing   Problem: Pain Managment: Goal: General experience of comfort will improve and/or be controlled Outcome: Progressing   Problem: Activity: Goal: Risk for activity intolerance will decrease Outcome: Not Progressing   Problem: Nutrition: Goal: Adequate nutrition will be maintained Outcome: Not Progressing

## 2024-04-09 NOTE — ED Notes (Signed)
 Pt refused Tylenol  till he is upstairs in his admission bed.

## 2024-04-09 NOTE — H&P (Signed)
 History and Physical  Jeff Romero FMW:[id number] DOB: 02/06/1961 DOA: 04/09/2024  PCP: [facility]   Chief Complaint: Severe throat pain, weight loss  HPI: Jeff Romero is an unfortunate 63 y.o. male with medical history significant for stage IV non-small cell lung cancer metastatic to bone and lymph node status post radiation on Keytruda , PE and recent right external jugular vein thrombus currently on Lovenox  being admitted to the hospital with uncontrolled cancer related pain.  He has been under the care of [name], currently on Keytruda  and completed radiation.  He has also been followed by palliative care for symptom management, recently his pain medication regimen was adjusted he has been placed on OxyContin  10 mg p.o. every 8 hours, as well as oxycodone  15 mg p.o. every 4 hours for breakthrough pain.  Unfortunately has had continued severe throat pain which makes swallowing Possible, he has a lot of difficulty taking his p.o. medications, has a lot of trouble tolerating drinking water, even nutritional supplements like Ensure are too thick and painful.  He was hospitalized in the middle of June, was found at that time to have a right external jugular vein thrombosis in the setting of Eliquis , was transition to therapeutic Lovenox  which she continues.  He denies any fever, vomiting, has continued severe dysphagia and weight loss.  Review of Systems: Please see HPI for pertinent positives and negatives. A complete 10 system review of systems are otherwise negative.  Past Medical History:  Diagnosis Date   Allergy    Anxiety    Arthritis    Blood transfusion without reported diagnosis    Cataract    Depression    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   Prostate cancer [facility])    Past Surgical History:  Procedure Laterality Date   BRONCHIAL BIOPSY  01/21/2024   Procedure: BRONCHOSCOPY, WITH BIOPSY;  Surgeon: Gretta Leita SQUIBB, DO;  Location: [facility]  ENDOSCOPY;  Service: Cardiopulmonary;;   BRONCHIAL BRUSHINGS  01/21/2024   Procedure: BRONCHOSCOPY, WITH BRUSH BIOPSY;  Surgeon: Gretta Leita SQUIBB, DO;  Location: [facility] ENDOSCOPY;  Service: Cardiopulmonary;;   FLEXIBLE BRONCHOSCOPY N/A 01/21/2024   Procedure: ELLIOTT SIDE;  Surgeon: Gretta Leita SQUIBB, DO;  Location: [facility] ENDOSCOPY;  Service: Cardiopulmonary;  Laterality: N/A;   INGUINAL HERNIA REPAIR Right 09/18/2021   Procedure: OPEN RIGHT INGUINAL HERNIA REPAIR WITH MESH;  Surgeon: Kinsinger, Herlene Righter, MD;  Location: [facility] ORS;  Service: General;  Laterality: Right;   IR THORACENTESIS ASP PLEURAL SPACE W/IMG GUIDE  03/21/2024   KNEE CARTILAGE SURGERY Right    PROSTATE SURGERY     ROTATOR CUFF REPAIR Right    Social History:  reports that he quit smoking about 9 years ago. His smoking use included e-cigarettes. He has never used smokeless tobacco. He reports current alcohol use of about 6.0 standard drinks of alcohol per week. He reports that he does not use drugs.  Allergies  Allergen Reactions   Breyna  [Budesonide -Formoterol  Fumarate] Cough    Worsened existing cough    Family History  Problem Relation Age of Onset   Sickle cell anemia Mother    Cirrhosis Mother    Alcohol abuse Mother    Bone cancer Father    Hypertension Sister    Hypertension Brother    Colon cancer Neg Hx    Esophageal cancer Neg Hx    Rectal cancer Neg Hx    Stomach cancer Neg Hx      Prior to Admission medications  Medication Sig Start Date End Date Taking? Authorizing Provider  acetaminophen  (TYLENOL ) 500 MG tablet Take 2 tablets (1,000 mg total) by mouth every 8 (eight) hours. 02/15/24  Yes Ghimire, Donalda HERO, MD  albuterol  (VENTOLIN  HFA) 108 (90 Base) MCG/ACT inhaler Inhale 1 puff into the lungs every 6 (six) hours as needed for wheezing or shortness of breath. 12/23/23  Yes [provider]  benzonatate  (TESSALON ) 200 MG capsule Take 1 capsule (200 mg total) by mouth 3 (three) times daily. 03/28/24   Yes Pickenpack-Cousar, Fannie SAILOR, NP  enoxaparin  (LOVENOX ) 60 MG/0.6ML injection Inject 0.6 mLs (60 mg total) into the skin 2 (two) times daily. 03/18/24 04/17/24 Yes Chatterjee, Srobona Tublu, MD  fenofibrate  160 MG tablet Take 160 mg by mouth daily.   Yes [provider]  ferrous sulfate 325 (65 FE) MG tablet Take 325 mg by mouth daily with breakfast.   Yes [provider]  HYDROcodone  bit-homatropine (HYCODAN) 5-1.5 MG/5ML syrup Take 5 mLs by mouth every 8 (eight) hours as needed for cough. 03/28/24  Yes Pickenpack-Cousar, Fannie SAILOR, NP  magic mouthwash (nystatin , diphenhydrAMINE , alum & mag hydroxide) suspension mixture Swish and swallow 10 mLs 4 (four) times daily as needed for mouth pain. 04/04/24  Yes Pickenpack-Cousar, Fannie SAILOR, NP  megestrol  (MEGACE ) 20 MG tablet Take 40 mg by mouth 4 (four) times daily -  with meals and at bedtime. 02/21/24  Yes [provider]  ondansetron  (ZOFRAN ) 8 MG tablet Take 0.5 tablets (4 mg total) by mouth every 8 (eight) hours as needed for nausea. 03/28/24  Yes Pickenpack-Cousar, Fannie SAILOR, NP  OVER THE COUNTER MEDICATION Apply 1 Application topically daily. Topical cream with unknown active ingredient/strength. Sample given by oncologist in office for daily use on radiation burn.   Yes [provider]  oxyCODONE  (OXYCONTIN ) 10 mg 12 hr tablet Take 1 tablet (10 mg total) by mouth every 8 (eight) hours. 03/28/24  Yes Pickenpack-Cousar, Fannie SAILOR, NP  oxyCODONE  (ROXICODONE ) 15 MG immediate release tablet Take 1 tablet (15 mg total) by mouth every 4 (four) hours as needed. 03/28/24  Yes Pickenpack-Cousar, Fannie SAILOR, NP  pantoprazole  (PROTONIX ) 40 MG tablet Take 1 tablet (40 mg total) by mouth daily. 03/14/24  Yes Pickenpack-Cousar, Fannie SAILOR, NP  pembrolizumab  (KEYTRUDA ) 100 MG/4ML SOLN Inject 200 mg into the vein every 21 ( twenty-one) days.   Yes [provider]  polyethylene glycol powder (GLYCOLAX /MIRALAX ) 17 GM/SCOOP powder Take 17 g  by mouth daily. 02/15/24  Yes Ghimire, Donalda HERO, MD  rosuvastatin  (CRESTOR ) 10 MG tablet Take 10 mg by mouth daily. 12/23/23  Yes [provider]  senna-docusate (SENOKOT-S) 8.6-50 MG tablet Take 2 tablets by mouth at bedtime. 02/15/24  Yes Ghimire, Donalda HERO, MD  sucralfate  (CARAFATE ) 1 g tablet Take 1 tablet (1 g total) by mouth 4 (four) times daily -  with meals and at bedtime. 03/01/24  Yes Sherrod Sherrod, MD  gabapentin  (NEURONTIN ) 100 MG capsule Take 1 capsule (100 mg total) by mouth 2 (two) times daily. Patient not taking: Reported on 04/09/2024 04/05/24   [place] Fannie SAILOR, NP    Physical Exam: BP 124/64   Pulse (!) 123   Temp 98 F (36.7 C) (Oral)   Resp (!) 31   SpO2 94%  General: Thin emaciated and chronically ill-appearing gentleman, currently in no acute distress.  Speaking in full sentences without any cough, shortness of breath. Cardiovascular: RRR, no murmurs or rubs, no peripheral edema  Respiratory: clear to auscultation bilaterally, no  wheezes, no crackles  Abdomen: soft, nontender, scaphoid Skin: dry, no rashes  Musculoskeletal: no joint effusions, normal range of motion  Psychiatric: appropriate affect, normal speech  Neurologic: extraocular muscles intact, clear speech, moving all extremities with intact sensorium         Labs on Admission:  Basic Metabolic Panel: Recent Labs  Lab 04/03/24 1432 04/09/24 1237  NA 138 139  K 3.5 3.5  CL 103 103  CO2 25 23  GLUCOSE 118* 94  BUN 14 17  CREATININE 0.66 0.70  CALCIUM  8.9 8.3*   Liver Function Tests: Recent Labs  Lab 04/03/24 1432  AST 34  ALT 32  ALKPHOS 149*  BILITOT 0.3  PROT 6.5  ALBUMIN 2.7*   No results for input(s): LIPASE, AMYLASE in the last 168 hours. No results for input(s): AMMONIA in the last 168 hours. CBC: Recent Labs  Lab 04/03/24 1432 04/09/24 1237  WBC 9.1 8.7  NEUTROABS 7.7 7.4  HGB 7.0* 9.4*  HCT 22.2* 30.6*  MCV 76.0* 81.0  PLT 376 251   Cardiac  Enzymes: No results for input(s): CKTOTAL, CKMB, CKMBINDEX, TROPONINI in the last 168 hours. BNP (last 3 results) Recent Labs    02/13/24 0525 03/15/24 1234 04/09/24 1237  BNP 43.2 30.9 62.7    ProBNP (last 3 results) No results for input(s): PROBNP in the last 8760 hours.  CBG: No results for input(s): GLUCAP in the last 168 hours.  Radiological Exams on Admission: [facility] Chest Port 1 View Result Date: 04/09/2024 CLINICAL DATA:  Short of breath stage IV lung cancer EXAM: PORTABLE CHEST 1 VIEW COMPARISON:  Radiograph 03/21/2024, CT 03/15/2024. FINDINGS: Normal cardiac silhouette. Significant interval increase in density of RIGHT upper lobe mass. Nodule in the superior segment of LEFT lower lobe also appears increased. No pneumothorax. No pleural fluid. No acute osseous abnormality. IMPRESSION: 1. Increased masslike consolidation in the RIGHT upper lobe. Differential includes progressive pulmonary infection versus progressive neoplasm versus a combination of both. 2. Increase in volume of LEFT lobe nodule suggests progressive lung cancer. Electronically Signed   By: Jackquline Boxer M.D.   On: 04/09/2024 12:51   Assessment/Plan Jeff Romero is an unfortunate 63 y.o. male with medical history significant for stage IV non-small cell lung cancer metastatic to bone and lymph node status post radiation on Keytruda , PE and recent right external jugular vein thrombus currently on Lovenox  being admitted to the hospital with uncontrolled cancer related pain.   Stage IV non-small cell lung cancer-with known bony and lymphatic metastases.  Unfortunately he has severe esophagitis with dysphagia status post radiation.  Now being admitted to the hospital with evidence of dehydration, severe pain, weight loss in the setting of inability to tolerate p.o. intake. -Observation admission -IV fluids -OxyContin , oxycodone , IV Dilaudid  for breakthrough pain -Palliative care for symptom management, goals  of care discussion; given his continued failure to thrive and weight loss, he may be best served in this unfortunate situation with inpatient hospice  Right external jugular vein thrombosis-continue therapeutic Lovenox      Code Status: Full Code  Consults called: Palliative care  Admission status: Observation  Time spent: 55 minutes  Siraj Dermody CHRISTELLA Gail MD [facility] Pager [phone]  If 7PM-7AM, please contact night-coverage [email or web address] Password [email or web address]  04/09/2024, 2:55 PM

## 2024-04-10 DIAGNOSIS — I2699 Other pulmonary embolism without acute cor pulmonale: Secondary | ICD-10-CM | POA: Diagnosis not present

## 2024-04-10 DIAGNOSIS — Z7901 Long term (current) use of anticoagulants: Secondary | ICD-10-CM | POA: Diagnosis not present

## 2024-04-10 DIAGNOSIS — R64 Cachexia: Secondary | ICD-10-CM | POA: Diagnosis present

## 2024-04-10 DIAGNOSIS — C3490 Malignant neoplasm of unspecified part of unspecified bronchus or lung: Secondary | ICD-10-CM | POA: Diagnosis not present

## 2024-04-10 DIAGNOSIS — R1310 Dysphagia, unspecified: Secondary | ICD-10-CM

## 2024-04-10 DIAGNOSIS — R070 Pain in throat: Secondary | ICD-10-CM | POA: Diagnosis not present

## 2024-04-10 DIAGNOSIS — E876 Hypokalemia: Secondary | ICD-10-CM | POA: Diagnosis present

## 2024-04-10 DIAGNOSIS — T66XXXA Radiation sickness, unspecified, initial encounter: Secondary | ICD-10-CM | POA: Diagnosis present

## 2024-04-10 DIAGNOSIS — R053 Chronic cough: Secondary | ICD-10-CM | POA: Diagnosis not present

## 2024-04-10 DIAGNOSIS — G894 Chronic pain syndrome: Secondary | ICD-10-CM | POA: Diagnosis present

## 2024-04-10 DIAGNOSIS — R638 Other symptoms and signs concerning food and fluid intake: Secondary | ICD-10-CM | POA: Diagnosis not present

## 2024-04-10 DIAGNOSIS — Z832 Family history of diseases of the blood and blood-forming organs and certain disorders involving the immune mechanism: Secondary | ICD-10-CM | POA: Diagnosis not present

## 2024-04-10 DIAGNOSIS — C7951 Secondary malignant neoplasm of bone: Secondary | ICD-10-CM | POA: Diagnosis present

## 2024-04-10 DIAGNOSIS — D509 Iron deficiency anemia, unspecified: Secondary | ICD-10-CM | POA: Diagnosis present

## 2024-04-10 DIAGNOSIS — C3411 Malignant neoplasm of upper lobe, right bronchus or lung: Secondary | ICD-10-CM | POA: Diagnosis present

## 2024-04-10 DIAGNOSIS — Z515 Encounter for palliative care: Secondary | ICD-10-CM

## 2024-04-10 DIAGNOSIS — Z8249 Family history of ischemic heart disease and other diseases of the circulatory system: Secondary | ICD-10-CM | POA: Diagnosis not present

## 2024-04-10 DIAGNOSIS — K3189 Other diseases of stomach and duodenum: Secondary | ICD-10-CM | POA: Diagnosis not present

## 2024-04-10 DIAGNOSIS — R627 Adult failure to thrive: Secondary | ICD-10-CM | POA: Diagnosis present

## 2024-04-10 DIAGNOSIS — Z8546 Personal history of malignant neoplasm of prostate: Secondary | ICD-10-CM | POA: Diagnosis not present

## 2024-04-10 DIAGNOSIS — Z7189 Other specified counseling: Secondary | ICD-10-CM | POA: Diagnosis not present

## 2024-04-10 DIAGNOSIS — K2210 Ulcer of esophagus without bleeding: Secondary | ICD-10-CM | POA: Diagnosis not present

## 2024-04-10 DIAGNOSIS — G893 Neoplasm related pain (acute) (chronic): Secondary | ICD-10-CM | POA: Diagnosis present

## 2024-04-10 DIAGNOSIS — K5903 Drug induced constipation: Secondary | ICD-10-CM | POA: Diagnosis not present

## 2024-04-10 DIAGNOSIS — F418 Other specified anxiety disorders: Secondary | ICD-10-CM | POA: Diagnosis not present

## 2024-04-10 DIAGNOSIS — K449 Diaphragmatic hernia without obstruction or gangrene: Secondary | ICD-10-CM | POA: Diagnosis not present

## 2024-04-10 DIAGNOSIS — K2289 Other specified disease of esophagus: Secondary | ICD-10-CM | POA: Diagnosis present

## 2024-04-10 DIAGNOSIS — E43 Unspecified severe protein-calorie malnutrition: Secondary | ICD-10-CM | POA: Diagnosis present

## 2024-04-10 DIAGNOSIS — E860 Dehydration: Secondary | ICD-10-CM | POA: Diagnosis present

## 2024-04-10 DIAGNOSIS — K208 Other esophagitis without bleeding: Secondary | ICD-10-CM | POA: Diagnosis present

## 2024-04-10 DIAGNOSIS — J449 Chronic obstructive pulmonary disease, unspecified: Secondary | ICD-10-CM | POA: Diagnosis present

## 2024-04-10 DIAGNOSIS — C779 Secondary and unspecified malignant neoplasm of lymph node, unspecified: Secondary | ICD-10-CM | POA: Diagnosis present

## 2024-04-10 DIAGNOSIS — Z681 Body mass index (BMI) 19 or less, adult: Secondary | ICD-10-CM | POA: Diagnosis not present

## 2024-04-10 DIAGNOSIS — Z79899 Other long term (current) drug therapy: Secondary | ICD-10-CM | POA: Diagnosis not present

## 2024-04-10 DIAGNOSIS — E785 Hyperlipidemia, unspecified: Secondary | ICD-10-CM | POA: Diagnosis present

## 2024-04-10 LAB — TYPE AND SCREEN
ABO/RH(D): A POS
Antibody Screen: POSITIVE
Donor AG Type: NEGATIVE
Donor AG Type: NEGATIVE
Unit division: 0
Unit division: 0

## 2024-04-10 LAB — BASIC METABOLIC PANEL WITH GFR
Anion gap: 8 (ref 5–15)
BUN: 15 mg/dL (ref 8–23)
CO2: 24 mmol/L (ref 22–32)
Calcium: 7.9 mg/dL — ABNORMAL LOW (ref 8.9–10.3)
Chloride: 107 mmol/L (ref 98–111)
Creatinine, Ser: 0.67 mg/dL (ref 0.61–1.24)
GFR, Estimated: >60 mL/min (ref >60)
Glucose, Bld: 113 mg/dL — ABNORMAL HIGH (ref 70–99)
Potassium: 3.5 mmol/L (ref 3.5–5.1)
Sodium: 139 mmol/L (ref 135–145)

## 2024-04-10 LAB — BPAM RBC
Blood Product Expiration Date: 202507272359
Blood Product Expiration Date: 202507272359
ISSUE DATE / TIME: 202507011116
ISSUE DATE / TIME: 202507031030
Unit Type and Rh: 6200
Unit Type and Rh: 6200

## 2024-04-10 LAB — CBC
HCT: 24.8 % — ABNORMAL LOW (ref 39.0–52.0)
Hemoglobin: 7.6 g/dL — ABNORMAL LOW (ref 13.0–17.0)
MCH: 24.4 pg — ABNORMAL LOW (ref 26.0–34.0)
MCHC: 30.6 g/dL (ref 30.0–36.0)
MCV: 79.5 fL — ABNORMAL LOW (ref 80.0–100.0)
Platelets: 245 K/uL (ref 150–400)
RBC: 3.12 MIL/uL — ABNORMAL LOW (ref 4.22–5.81)
RDW: 21.4 % — ABNORMAL HIGH (ref 11.5–15.5)
WBC: 7.7 K/uL (ref 4.0–10.5)
nRBC: 0 % (ref 0.0–0.2)

## 2024-04-10 MED ORDER — BENZOCAINE 20 % MT AERO: INHALATION_SPRAY | Freq: Three times a day (TID) | OROMUCOSAL | Status: DC | PRN

## 2024-04-10 MED ORDER — PANTOPRAZOLE SODIUM 40 MG PO TBEC
40.0000 mg | DELAYED_RELEASE_TABLET | Freq: Every day | ORAL | Status: DC
  Administered 2024-04-11 – 2024-04-13 (×2): 40 mg via ORAL
  Filled 2024-04-10 (×3): qty 1

## 2024-04-10 MED ORDER — ENOXAPARIN SODIUM 60 MG/0.6ML IJ SOSY
50.0000 mg | PREFILLED_SYRINGE | Freq: Two times a day (BID) | INTRAMUSCULAR | Status: AC
  Administered 2024-04-10 – 2024-04-11 (×2): 50 mg via SUBCUTANEOUS
  Filled 2024-04-10 (×2): qty 0.6

## 2024-04-10 MED ORDER — GABAPENTIN 100 MG PO CAPS
100.0000 mg | ORAL_CAPSULE | Freq: Two times a day (BID) | ORAL | Status: DC
  Administered 2024-04-10 – 2024-04-13 (×6): 100 mg via ORAL
  Filled 2024-04-10 (×8): qty 1

## 2024-04-10 MED ORDER — BENZOCAINE 20 % MT AERO
INHALATION_SPRAY | Freq: Three times a day (TID) | OROMUCOSAL | Status: DC | PRN
  Filled 2024-04-10: qty 57

## 2024-04-10 NOTE — Consult Note (Addendum)
 Palliative Medicine Inpatient Consult Note  Consulting Provider:  Zella Katha HERO, MD   Reason for consult:  uncontrolled pain.  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 Palliative care has been asked to assist with additional goals of care conversations.   Clinical Assessment/Goals of Care:  *Please note that this is a verbal dictation therefore any spelling or grammatical errors are due to the Dragon Medical One system interpretation.  I have reviewed medical records including EPIC notes, labs and imaging, received report from bedside RN, assessed the patient who is lying in bed complaining of throat pain from radiation and a chronic cough.    I met with Darin Hamilton to further discuss diagnosis prognosis, GOC, EOL wishes, disposition and options.   I introduced Palliative Medicine as specialized medical care for people living with serious illness. It focuses on providing relief from the symptoms and stress of a serious illness. The goal is to improve quality of life for both the patient and the family.  Medical History Review and Understanding:  A review of Jontez's past medical history significant for anxiety, depression, arthritis,   stage IV non-small cell lung cancer metastatic to bone and lymph nodes ->  status post radiation on Keytruda , PE, and right external jugular vein thrombus was completed.   Social History:  Jaken lives in [place], [place] . He and his wife, [name] have been married for> 40 years. He has 3 children and 11 grandchildren.  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   Functional and Nutritional  State:  Preceding hospitalization, Contrell was able to mobilize in his home. He does perform badls and iadls. He has had a worsening appetite due to his chronic cough.   Palliative Symptoms:  Cough which has been ongoing for months - he has tried Hycodan and nebulizer treatments, which exacerbated the cough.  Throat Pain - OxyContin  10 mg p.o. every 8 hours, as well as oxycodone  15 mg p.o. every 4 hours for breakthrough pain   Advance Directives:  A detailed discussion was had today regarding advanced directives.  Patients spouse, [name] is his Runner, broadcasting/film/video.   Code Status:  Concepts specific to code status, artifical feeding and hydration, continued IV antibiotics and rehospitalization was had.  The difference between a aggressive medical intervention path  and a palliative comfort care path for this patient at this time was had.   Zakhai would like to pursue all measures at this time for life prolongation.   Discussion:  I spoke with Darin at length this afternoon. He shares that he has had a tough time in regards to his chronic cough from his lung cancer and his throat pain due to his neck radiation treatment. We discussed the various methods that have been used to better control both the cough and the pain. Jaquell shares to date nothing has worked to relief his discomfort.   Reviewed a plan to provide better symptoms relief. At this time we will request consultations of the nutrition to better support what Eisen should be eating - he has been educated that cold fluids are more restrictive exacerbating pain. Have recommended room temperature liquids and warm (not  hot) foods.   Have asked for speech to better support recommendations on how Saron should be siiting to swallow and present aspiration events.  Have requested suction be set up at beside.  Incentive spirometry will be utilized frequently to better support lung volume and diaphragm strength.  Had added low dose  gabapentin  to help with cough.  Had added hurricane spray to help throat pain as needed.  I did share with Andrell that in the setting of significant symptoms often there are additional decisions regarding nutritional supplementation. I was honest in sharing sometimes this involves the need for a gastrostomy tube and artificial feeding to maintain caloric intake and support strength. Marlan shares understanding though would ideally like to avoid this decision as long as able. He is hopeful that a path to symptom improvement will support him.  _____________________ Addendum:  A discussion was held with patients wife, [name] this afternoon. We reviewed Izack's current state. She shares the hope to understand what is going on now as opposed to just treating. WE discussed that she would prefer if a scope could be done to better assess the inflammation in the esophagus and verify there is nothing else causing patients pain and failure to thrive. I shared that I would alert the primary team to this desire.  We discussed the above management options which [name] does feel are reasonable to try while the patient remains hospitalized.  [name] feels that the most helpful medication while Fedrick has been inpatient has been his dilaudid  IVP. We discussed having further conversation(s) about whether or not to switch this in the near future to see if it makes a difference.   Discussed the importance of continued conversation with family and their  medical providers regarding overall plan of care and treatment options, ensuring decisions are within the context of the patients values and GOCs.  Add time: 76  Decision Maker: Betke,[name] (Spouse): [phone] (Mobile)   SUMMARY OF RECOMMENDATIONS   FULL CODE  Symptom management as below  Ongoing PMT support  Code Status/Advance Care Planning: FULL CODE   Symptom Management:  Cough, chronic: - Continue Tessalon  200mg  PO TID - Continue  Hycodain 5ml Q8H PRN - Start Gabapentin  100mg  PO BID - Suction to be set up at bedside for expectorant  Throat Pain: - Continue Oxycontin  10mg  PO Q8H - Continue Cxycodone 15mg  PO Q4H PRN breakthrough pain --> Will consider switching to oral dilaudid  PRN as this appears to be more effective for pain - Continue IV dilaudid  PRN - Restart Protonix  40mg  Po Qday - Continue carafate  prior to meals to coat esophagus - Start hurricaine spray TID PRN - Appreciate speech therapy education for patients need to be upright with eating/drinking - Appreciate nutritional guidance on food textures and temperatures  Weight Loss, Adult FTT: - Continue megace  40mg  PO TID   Palliative Prophylaxis:  Aspiration, Bowel Regimen, Delirium Protocol, Frequent Pain Assessment, Oral Care, Palliative Wound Care, and Turn Reposition  Additional Recommendations (Limitations, Scope, Preferences): Full scope of care  Psycho-social/Spiritual:  Desire for further Chaplaincy support: Not at this time Additional Recommendations: Education on radiation associated throat pain   Prognosis: Difficult to determine presently.   Discharge Planning: Home once medically optimized.   Vitals:   04/10/24 1047 04/10/24 1148  BP: 124/67 114/77  Pulse: (!) 113 (!) 113  Resp:  18  Temp: 98.5 F (36.9 C) 98.3 F (36.8 C)  SpO2: 94% 96%    Intake/Output Summary (Last 24 hours) at 04/10/2024 1231 Last data  filed at 04/10/2024 0741 Gross per 24 hour  Intake 2991.81 ml  Output 275 ml  Net 2716.81 ml   Last Weight  Most recent update: 04/09/2024  4:03 PM    Weight  53.4 kg (117 lb 11.6 oz)            Gen:  Older AA M chronically ill appearing HEENT: moist mucous membranes CV: Regular rate and rhythm  PULM: On RA, breathing is nonlabored ABD: soft/nontender  EXT: (+) Muscle wasting Neuro: Alert and oriented x3   PPS: 50%   This conversation/these recommendations were discussed with patient primary care team, Dr.  Leotis ______________________________________________________ Rosaline Becton [facility] Palliative Medicine Team Team Cell Phone: [phone] Please utilize secure chat with additional questions, if there is no response within 30 minutes please call the above phone number  Total Time: 29 Billing based on MDM: High  Palliative Medicine Team providers are available by phone from 7am to 7pm daily and can be reached through the team cell phone.  Should this patient require assistance outside of these hours, please call the patient's attending physician.

## 2024-04-10 NOTE — Evaluation (Signed)
 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   Prostate cancer [facility])    Past Surgical History:  Past Surgical History:  Procedure Laterality Date   BRONCHIAL BIOPSY  01/21/2024   Procedure: BRONCHOSCOPY, WITH BIOPSY;  Surgeon: Gretta Leita SQUIBB, DO;  Location: [facility] ENDOSCOPY;  Service: Cardiopulmonary;;   BRONCHIAL BRUSHINGS  01/21/2024   Procedure: BRONCHOSCOPY, WITH BRUSH BIOPSY;  Surgeon: Gretta Leita SQUIBB, DO;  Location: [facility] ENDOSCOPY;  Service: Cardiopulmonary;;   FLEXIBLE BRONCHOSCOPY N/A 01/21/2024   Procedure: ELLIOTT SIDE;  Surgeon: Gretta Leita SQUIBB, DO;  Location: [facility] ENDOSCOPY;  Service: Cardiopulmonary;  Laterality: N/A;   INGUINAL HERNIA REPAIR Right 09/18/2021   Procedure: OPEN RIGHT INGUINAL HERNIA REPAIR WITH MESH;  Surgeon: Kinsinger, Herlene Righter, MD;  Location: [facility] ORS;  Service: General;  Laterality: Right;   IR THORACENTESIS ASP PLEURAL SPACE W/IMG GUIDE  03/21/2024   KNEE CARTILAGE SURGERY Right    PROSTATE SURGERY     ROTATOR CUFF REPAIR Right    HPI:  63 yr old male with medical history significant for stage IV non-small cell lung cancer, metastatic to bone and lymph node status post radiation, PE and recent right external jugular vein thrombus presented to the hospital with uncontrolled cancer related pain. Pt reports cough and throat pain that has persisted for several months and is worsening. He has lost weight.  Water causes significant pain; he states he is able to tolerate tea (iced/hot), soups, ice cream.  Palliative  care is following and made referral for swallow evaluation.    Assessment / Plan / Recommendation  Clinical Impression  Pt presents with odynophagia, no s/s of aspiration, but complaints of globus that could suggest decreased efficiency of esophageal swallow.  Oral mechanism exam was normal. Pt accepted limited sips of thin liquid, purees, and swallowed a pill with ice cream.  He describes ongoing pain, sensation of material balling up in chest. We discussed the inflammation and irritation that are an unfortunate side effect of radiation. We discussed the work and effort required to swallow and the potential benefits of a G-tube - allowing Mr. Beeks to eat/drink for pleasure, removing the pressure to consume all his calories/nutrition by mouth.  We discussed the potential of imaging to determine efficiency of the swallow.    After session, returned to room and pt was speaking with his wife on the phone. She conveyed her desire for a GI consult prior to pursuing further testing with SLP.    For now, recommend pt continue to eat/drink the POs that are most comfortable; consider warmer/room temperature food/liquids; eat in an upright position and keep HOB elevated 30-45 min after eating. Spoke with RN and Palliative Care, Rosaline Becton, regarding the aformentioned information.   Our service will respectfully sign off. Please re-consult if we can offer further help.   SLP Visit Diagnosis: Dysphagia, unspecified (R13.10)    Aspiration Risk  Other (comment) (low aspiration risk)    Diet Recommendation   Thin;Age appropriate regular  Medication Administration: Whole meds with  liquid    Other  Recommendations Oral Care Recommendations: Oral care BID       Swallow Study   General HPI: 63 yr old male with medical history significant for stage IV non-small cell lung cancer, metastatic to bone and lymph node status post radiation, PE and recent right external jugular vein thrombus presented to  the hospital with uncontrolled cancer related pain. Pt reports cough and throat pain that has persisted for several months and is worsening. He has lost weight.  Water causes significant pain; he states he is able to tolerate tea (iced/hot), soups, ice cream.  Palliative care is following and made referral for swallow evaluation. Type of Study: Bedside Swallow Evaluation Previous Swallow Assessment: no Diet Prior to this Study: Regular;Thin liquids (Level 0) Temperature Spikes Noted: No Respiratory Status: Room air Behavior/Cognition: Alert;Cooperative Oral Cavity Assessment: Within Functional Limits Oral Care Completed by SLP: Recent completion by staff Oral Cavity - Dentition: Adequate natural dentition Vision: Functional for self-feeding Self-Feeding Abilities: Able to feed self Patient Positioning: Upright in bed Baseline Vocal Quality: Normal Volitional Cough: Strong Volitional Swallow: Able to elicit    Oral/Motor/Sensory Function Overall Oral Motor/Sensory Function: Within functional limits   Ice Chips Ice chips: Within functional limits   Thin Liquid Thin Liquid: Within functional limits; c/o globus   Nectar Thick Nectar Thick Liquid: Not tested   Honey Thick Honey Thick Liquid: Not tested   Puree Puree: Within functional limits; c/o globus  Solid     Solid: Not tested      Vona Palma Laurice 04/10/2024,3:38 PM Palma L. Vona, MA CCC/SLP Clinical Specialist - Acute Care SLP Acute Rehabilitation Services Office number [phone]

## 2024-04-10 NOTE — Plan of Care (Signed)
  Problem: Education: Goal: Knowledge of General Education information will improve Description: Including pain rating scale, medication(s)/side effects and non-pharmacologic comfort measures Outcome: Progressing   Problem: Clinical Measurements: Goal: Respiratory complications will improve Outcome: Progressing   Problem: Activity: Goal: Risk for activity intolerance will decrease Outcome: Progressing   Problem: Nutrition: Goal: Adequate nutrition will be maintained Outcome: Progressing   Problem: Pain Managment: Goal: General experience of comfort will improve and/or be controlled Outcome: Progressing   Problem: Safety: Goal: Ability to remain free from injury will improve Outcome: Progressing

## 2024-04-10 NOTE — Progress Notes (Addendum)
 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  He has been under the care of [name], currently on Keytruda  and completed radiation. He has also been followed by palliative care for symptom management, recently his pain medication regimen was adjusted he has been placed on OxyContin  10 mg p.o. every 8 hours, as well as oxycodone  15 mg p.o. every 4 hours for breakthrough pain.  Patient continued to complain about severe throat pain which makes swallowing difficult, also has difficulty taking a lot of his p.o. medications, also reports difficulty tolerating drinking water and even nutritional supplements like Ensure.  He was hospitalized in June and was found at that time to have a right external jugular vein thrombosis in the setting of Eliquis .  He was transitioned to therapeutic Lovenox  which he is continuing.  Patient was admitted for further management.  Assessment & Plan:   Principal Problem:   Cancer-related breakthrough pain   Metastatic Stage IV non-small cell lung cancer: Unfortunately he has severe esophagitis with dysphagia status post radiation.   Now being admitted to the hospital with evidence of dehydration, severe pain, weight loss in the setting of inability to tolerate orally. Continue IV fluid resuscitation. Continue OxyContin , oxycodone , IV Dilaudid  for breakthrough pain Palliative care for symptom management, goals of care discussion; given his continued failure to thrive and weight loss, he may be best served in this unfortunate situation with inpatient hospice.   Right external  jugular vein thrombosis : Continue therapeutic Lovenox   Fever: Patient is spiked fever but there is no other signs of infection. Hold on antibiotics.  Follow-up cultures.  Chronic pain syndrome Continue OxyContin  oxycodone  and IV Dilaudid  for breakthrough pain.  Chronic normocytic normochromic anemia.: Prior baseline hemoglobin was 7.0 Continue to monitor H&H. There is no any obvious visible bleeding.   DVT prophylaxis: Therapeutic Lovenox  Code Status: Full code Family Communication: Family at bed side Disposition Plan:    Status is: Observation The patient remains OBS appropriate and will d/c before 2 midnights.   Patient admitted for uncontrollable cancer-related pain.  Consultants:  None  Procedures: None  Antimicrobials:  Anti-infectives (From admission, onward)    None      Subjective: Patient was seen and examined at bedside.  Overnight events noted. Patient reports feeling improved.  He has a fever 100.9. He reports pain is reasonably controlled with IV Dilaudid .   Objective: Vitals:   04/10/24 0259 04/10/24 0656 04/10/24 1047 04/10/24 1148  BP: 119/65 119/65 124/67 114/77  Pulse: (!) 129 (!) 120 (!) 113 (!) 113  Resp: 20 20  18   Temp: (!) 100.9 F (38.3 C) 98.4 F (36.9 C) 98.5 F (36.9 C) 98.3 F (36.8 C)  TempSrc: Oral Oral Oral Oral  SpO2: (!) 86% 91% 94% 96%  Weight:      Height:        Intake/Output Summary (Last 24 hours) at 04/10/2024 1200 Last data filed at 04/10/2024 0741 Gross per 24 hour  Intake 2991.81 ml  Output 275 ml  Net 2716.81 ml   Filed Weights   04/09/24 1603  Weight: 53.4 kg    Examination:  General  exam: Appears calm and comfortable, deconditioned, not in any acute distress. Respiratory system: Clear to auscultation. Respiratory effort normal.  RR 16 Cardiovascular system: S1 & S2 heard, RRR. No JVD, murmurs, rubs, gallops or clicks.  Gastrointestinal system: Abdomen is non distended, soft and non tender. Normal  bowel sounds heard. Central nervous system: Alert and oriented X 3. No focal neurological deficits. Extremities: Symmetric 5 x 5 power. Skin: No rashes, lesions or ulcers Psychiatry: Judgement and insight appear normal. Mood & affect appropriate.     Data Reviewed: I have personally reviewed following labs and imaging studies  CBC: Recent Labs  Lab 04/03/24 1432 04/09/24 1237 04/10/24 0508  WBC 9.1 8.7 7.7  NEUTROABS 7.7 7.4  --   HGB 7.0* 9.4* 7.6*  HCT 22.2* 30.6* 24.8*  MCV 76.0* 81.0 79.5*  PLT 376 251 245   Basic Metabolic Panel: Recent Labs  Lab 04/03/24 1432 04/09/24 1237 04/10/24 0508  NA 138 139 139  K 3.5 3.5 3.5  CL 103 103 107  CO2 25 23 24   GLUCOSE 118* 94 113*  BUN 14 17 15   CREATININE 0.66 0.70 0.67  CALCIUM  8.9 8.3* 7.9*   GFR: Estimated Creatinine Clearance: 71.4 mL/min (by C-G formula based on SCr of 0.67 mg/dL). Liver Function Tests: Recent Labs  Lab 04/03/24 1432  AST 34  ALT 32  ALKPHOS 149*  BILITOT 0.3  PROT 6.5  ALBUMIN 2.7*   No results for input(s): LIPASE, AMYLASE in the last 168 hours. No results for input(s): AMMONIA in the last 168 hours. Coagulation Profile: No results for input(s): INR, PROTIME in the last 168 hours. Cardiac Enzymes: No results for input(s): CKTOTAL, CKMB, CKMBINDEX, TROPONINI in the last 168 hours. BNP (last 3 results) No results for input(s): PROBNP in the last 8760 hours. HbA1C: No results for input(s): HGBA1C in the last 72 hours. CBG: No results for input(s): GLUCAP in the last 168 hours. Lipid Profile: No results for input(s): CHOL, HDL, LDLCALC, TRIG, CHOLHDL, LDLDIRECT in the last 72 hours. Thyroid  Function Tests: No results for input(s): TSH, T4TOTAL, FREET4, T3FREE, THYROIDAB in the last 72 hours. Anemia Panel: No results for input(s): VITAMINB12, FOLATE, FERRITIN, TIBC, IRON, RETICCTPCT in the last 72 hours. Sepsis Labs: No results  for input(s): PROCALCITON, LATICACIDVEN in the last 168 hours.  No results found for this or any previous visit (from the past 240 hours).   Radiology Studies: DG Chest Port 1 View Result Date: 04/09/2024 CLINICAL DATA:  Short of breath stage IV lung cancer EXAM: PORTABLE CHEST 1 VIEW COMPARISON:  Radiograph 03/21/2024, CT 03/15/2024. FINDINGS: Normal cardiac silhouette. Significant interval increase in density of RIGHT upper lobe mass. Nodule in the superior segment of LEFT lower lobe also appears increased. No pneumothorax. No pleural fluid. No acute osseous abnormality. IMPRESSION: 1. Increased masslike consolidation in the RIGHT upper lobe. Differential includes progressive pulmonary infection versus progressive neoplasm versus a combination of both. 2. Increase in volume of LEFT lobe nodule suggests progressive lung cancer. Electronically Signed   By: Jackquline Boxer M.D.   On: 04/09/2024 12:51   Scheduled Meds:  benzonatate   200 mg Oral TID   enoxaparin   50 mg Subcutaneous Q12H   megestrol   40 mg Oral TID WC & HS   oxyCODONE   10 mg Oral Q8H   polyethylene glycol  17 g Oral Daily   senna-docusate  2 tablet Oral QHS   sucralfate   1 g Oral TID WC & HS   Continuous Infusions:  LOS: 0 days    Time spent: 50 mins    Darcel Dawley, MD [facility]   If 7PM-7AM, please contact night-coverage

## 2024-04-10 NOTE — Plan of Care (Signed)
   Problem: Education: Goal: Knowledge of General Education information will improve Description Including pain rating scale, medication(s)/side effects and non-pharmacologic comfort measures Outcome: Progressing

## 2024-04-11 ENCOUNTER — Inpatient Hospital Stay (HOSPITAL_COMMUNITY)

## 2024-04-11 DIAGNOSIS — D509 Iron deficiency anemia, unspecified: Secondary | ICD-10-CM | POA: Diagnosis not present

## 2024-04-11 DIAGNOSIS — Z515 Encounter for palliative care: Secondary | ICD-10-CM | POA: Diagnosis not present

## 2024-04-11 DIAGNOSIS — R627 Adult failure to thrive: Secondary | ICD-10-CM | POA: Diagnosis not present

## 2024-04-11 DIAGNOSIS — R070 Pain in throat: Secondary | ICD-10-CM | POA: Diagnosis not present

## 2024-04-11 DIAGNOSIS — G893 Neoplasm related pain (acute) (chronic): Secondary | ICD-10-CM | POA: Diagnosis not present

## 2024-04-11 DIAGNOSIS — R053 Chronic cough: Secondary | ICD-10-CM | POA: Diagnosis not present

## 2024-04-11 DIAGNOSIS — R1310 Dysphagia, unspecified: Secondary | ICD-10-CM | POA: Diagnosis not present

## 2024-04-11 MED ORDER — ENOXAPARIN SODIUM 60 MG/0.6ML IJ SOSY
50.0000 mg | PREFILLED_SYRINGE | Freq: Two times a day (BID) | INTRAMUSCULAR | Status: DC
  Administered 2024-04-12 – 2024-04-15 (×6): 50 mg via SUBCUTANEOUS
  Filled 2024-04-11 (×6): qty 0.6

## 2024-04-11 MED ORDER — BOOST / RESOURCE BREEZE PO LIQD CUSTOM
1.0000 | Freq: Three times a day (TID) | ORAL | Status: DC
  Administered 2024-04-11 – 2024-04-14 (×4): 1 via ORAL

## 2024-04-11 MED ORDER — KATE FARMS STANDARD 1.4 PO LIQD
325.0000 mL | Freq: Two times a day (BID) | ORAL | Status: DC
  Administered 2024-04-11 – 2024-04-13 (×2): 325 mL via ORAL
  Filled 2024-04-11 (×11): qty 325

## 2024-04-11 MED ORDER — HYDROMORPHONE HCL 4 MG PO TABS
4.0000 mg | ORAL_TABLET | ORAL | Status: DC | PRN
  Administered 2024-04-11: 4 mg via ORAL
  Filled 2024-04-11: qty 1

## 2024-04-11 MED ORDER — PROSOURCE PLUS PO LIQD
30.0000 mL | Freq: Three times a day (TID) | ORAL | Status: DC
  Administered 2024-04-11 – 2024-04-15 (×3): 30 mL via ORAL
  Filled 2024-04-11 (×7): qty 30

## 2024-04-11 MED ORDER — ENSURE PLUS HIGH PROTEIN PO LIQD
237.0000 mL | Freq: Two times a day (BID) | ORAL | Status: DC
  Administered 2024-04-11: 237 mL via ORAL

## 2024-04-11 MED ORDER — BIOTENE DRY MOUTH MT LIQD: 15.0000 mL | OROMUCOSAL | Status: DC | PRN

## 2024-04-11 MED ORDER — SUCRALFATE 1 GM/10ML PO SUSP
1.0000 g | Freq: Three times a day (TID) | ORAL | Status: DC
  Administered 2024-04-11 – 2024-04-15 (×12): 1 g via ORAL
  Filled 2024-04-11 (×13): qty 10

## 2024-04-11 MED ORDER — HYDROMORPHONE HCL 4 MG PO TABS
4.0000 mg | ORAL_TABLET | Freq: Three times a day (TID) | ORAL | Status: DC
  Administered 2024-04-11 – 2024-04-15 (×9): 4 mg via ORAL
  Filled 2024-04-11 (×10): qty 1

## 2024-04-11 NOTE — Progress Notes (Signed)
 PROGRESS NOTE    Jeff Romero  FMW:[id number] DOB: 02-16-1961 DOA: 04/09/2024 PCP: [facility]   Brief Narrative:  This 63 yrs old unfortunate male with medical history significant for stage IV non-small cell lung cancer,  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  He has been under the care of [name], currently on Keytruda  and completed radiation. He has also been followed by palliative care for symptom management, recently his pain medication regimen was adjusted he has been placed on OxyContin  10 mg p.o. every 8 hours, as well as oxycodone  15 mg p.o. every 4 hours for breakthrough pain.  Patient continued to complain about severe throat pain which makes swallowing difficult, also has difficulty taking a lot of his p.o. medications, also reports difficulty tolerating drinking water and even nutritional supplements like Ensure.  He was hospitalized in June and was found at that time to have a right external jugular vein thrombosis in the setting of Eliquis .  He was transitioned to therapeutic Lovenox  which he is continuing.  Patient was admitted for further management.  Assessment & Plan:   Principal Problem:   Cancer-related breakthrough pain   Metastatic Stage IV non-small cell lung cancer: Unfortunately he has severe esophagitis with dysphagia status post radiation.   Now being admitted to the hospital with evidence of dehydration, severe pain, weight loss in the setting of inability to tolerate orally. Continue IV fluid resuscitation.  He is currently on Keytruda . Continue OxyContin , oxycodone , IV Dilaudid  for breakthrough pain Palliative care for symptom management, goals of care discussion; given his continued failure to thrive and weight loss, he may be best served in this unfortunate situation with inpatient  hospice.   Right external jugular vein thrombosis : Continue therapeutic Lovenox .  Fever: Patient has spiked fever but there is no other signs of infection. Hold on antibiotics.  Follow-up cultures.  Chronic pain syndrome: Continue OxyContin  oxycodone  and IV Dilaudid  for breakthrough pain.  Chronic normocytic normochromic anemia.: Prior baseline hemoglobin was 7.0 Continue to monitor H&H. There is no any obvious visible bleeding.  Throat pain: Continue HurriCaine spray thrice daily as needed, continue Magic mouthwash. Continue pain management. Barium swallow study ordered. GI consulted patient may need endoscopy pending barium swallow study. GI recommended ENT consult.   DVT prophylaxis: Therapeutic Lovenox  Code Status: Full code Family Communication: Family at bed side Disposition Plan:    Status is: Observation The patient remains OBS appropriate and will d/c before 2 midnights.   Patient admitted for uncontrollable cancer-related pain.  Consultants:  Gastroenterology  Procedures: None  Antimicrobials:  Anti-infectives (From admission, onward)    None      Subjective: Patient was seen and examined at bedside. Overnight events noted. Patient reports feeling improved.  He has a fever 100.9. He reports pain is reasonably controlled with IV Dilaudid . Patient is going to have barium swallow today.   Objective: Vitals:   04/10/24 2156 04/11/24 0024 04/11/24 0416 04/11/24 1336  BP:   139/69 (!) 140/70  Pulse:   (!) 117 (!) 122  Resp:   19 (!) 24  Temp: 99.6 F (37.6 C) 99.6 F (37.6 C) 99.5 F (37.5 C) 99.1 F (37.3 C)  TempSrc: Axillary Oral Oral Oral  SpO2:   93% 94%  Weight:      Height:        Intake/Output Summary (Last  24 hours) at 04/11/2024 1405 Last data filed at 04/11/2024 1053 Gross per 24 hour  Intake 720 ml  Output 825 ml  Net -105 ml   Filed Weights   04/09/24 1603  Weight: 53.4 kg    Examination:  General exam: Appears calm  and comfortable, deconditioned, not in any acute distress. Respiratory system: CTA bilaterally.  Respiratory effort normal.  RR 16 Cardiovascular system: S1 & S2 heard, RRR. No JVD, murmurs, rubs, gallops or clicks.  Gastrointestinal system: Abdomen is non distended, soft and non tender. Normal bowel sounds heard. Central nervous system: Alert and oriented X 3. No focal neurological deficits. Extremities: No edema, no cyanosis, no clubbing Skin: No rashes, lesions or ulcers Psychiatry: Judgement and insight appear normal. Mood & affect appropriate.     Data Reviewed: I have personally reviewed following labs and imaging studies  CBC: Recent Labs  Lab 04/09/24 1237 04/10/24 0508  WBC 8.7 7.7  NEUTROABS 7.4  --   HGB 9.4* 7.6*  HCT 30.6* 24.8*  MCV 81.0 79.5*  PLT 251 245   Basic Metabolic Panel: Recent Labs  Lab 04/09/24 1237 04/10/24 0508  NA 139 139  K 3.5 3.5  CL 103 107  CO2 23 24  GLUCOSE 94 113*  BUN 17 15  CREATININE 0.70 0.67  CALCIUM  8.3* 7.9*   GFR: Estimated Creatinine Clearance: 71.4 mL/min (by C-G formula based on SCr of 0.67 mg/dL). Liver Function Tests: No results for input(s): AST, ALT, ALKPHOS, BILITOT, PROT, ALBUMIN in the last 168 hours.  No results for input(s): LIPASE, AMYLASE in the last 168 hours. No results for input(s): AMMONIA in the last 168 hours. Coagulation Profile: No results for input(s): INR, PROTIME in the last 168 hours. Cardiac Enzymes: No results for input(s): CKTOTAL, CKMB, CKMBINDEX, TROPONINI in the last 168 hours. BNP (last 3 results) No results for input(s): PROBNP in the last 8760 hours. HbA1C: No results for input(s): HGBA1C in the last 72 hours. CBG: No results for input(s): GLUCAP in the last 168 hours. Lipid Profile: No results for input(s): CHOL, HDL, LDLCALC, TRIG, CHOLHDL, LDLDIRECT in the last 72 hours. Thyroid  Function Tests: No results for input(s): TSH,  T4TOTAL, FREET4, T3FREE, THYROIDAB in the last 72 hours. Anemia Panel: No results for input(s): VITAMINB12, FOLATE, FERRITIN, TIBC, IRON, RETICCTPCT in the last 72 hours. Sepsis Labs: No results for input(s): PROCALCITON, LATICACIDVEN in the last 168 hours.  No results found for this or any previous visit (from the past 240 hours).   Radiology Studies: No results found.  Scheduled Meds:  (feeding supplement) PROSource Plus  30 mL Oral TID BM   benzonatate   200 mg Oral TID   enoxaparin   50 mg Subcutaneous Q12H   feeding supplement  1 Container Oral TID BM   feeding supplement (KATE FARMS STANDARD 1.4)  325 mL Oral BID BM   gabapentin   100 mg Oral BID   HYDROmorphone   4 mg Oral TID AC   megestrol   40 mg Oral TID WC & HS   oxyCODONE   10 mg Oral Q8H   pantoprazole   40 mg Oral Daily   polyethylene glycol  17 g Oral Daily   senna-docusate  2 tablet Oral QHS   sucralfate   1 g Oral TID WC & HS   Continuous Infusions:   LOS: 1 day    Time spent: 35 mins    Darcel Dawley, MD [facility]   If 7PM-7AM, please contact night-coverage

## 2024-04-11 NOTE — H&P (View-Only) (Signed)
 Referring Provider: Dr. Darcel Dawley Primary Care Physician:  [facility] Primary Gastroenterologist:  Dr. Victory Brand  Reason for Consultation:  Throat pain, decreased oral intake  HPI: Jeff Romero is a 63 y.o. 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 Pylori gastritis. S/P RIH surgery 09/2021.   He presented to the ED 04/09/2024 secondary to throat pain which has progressively worsened over the past two weeks with decreased p.o. intake and weight loss.  Labs in the ED showed a WBC count of 7.7.  Hemoglobin 7.6 down from 9.4 on 04/09/2024.  Prior Hemoglobin level 7.0 on 6/3, transfused 1 unit of PRBCs as an outpatient at the cancer center 04/04/2024. Chest x-ray 04/09/2024 showed increased masslike consolidation in the right upper lobe and increased volume of the left lobe nodule suggestive of progressive lung cancer.He is currently on Keytruda  for metastatic non-small cell lung cancer as noted above and his last radiation treatment was 03/31/2024.  He describes having pain specifically to the right side of his throat when he swallows any food, pills or liquid. He denies having any difficulty swallowing food, pills or liquid just has pain when he swallows. He takes Pantoprazole  40 mg daily at home and Carafate  1 g 4 times daily.  He previously did not experience any heartburn but for the past few days he has had mild heartburn.  No upper or lower abdominal pain. He was evaluated by speech pathologist, no evidence of oral dysphagia, possible radiation esophagitis suspected and further GI evaluation recommended. Patient's wife, who is a  respiratory therapist, requests for her husband to undergo an EGD during this hospital admission. He has intermittent constipation for which he takes MiraLAX  and senna as needed.  His last bowel movement was 2 days ago which he stated was black.  His wife stated he started taking oral iron 1 month ago.  No bright red blood per the rectum.  He underwent a colonoscopy 07/2018 which was normal.  He was initially seen in our GI clinic 12/08/2021 for further evaluation regarding iron deficiency anemia.  At that time, his iron levels were normal therefore oral iron supplement was discontinued.  Repeat labs 02/2022 confirmed recurrent IDA.  He underwent an EGD by Dr. Brand 02/24/2022 which identified H. pylori gastritis which was treated with metronidazole /doxycycline /bismuth  subsalicylate/omeprazole  x 14 days.  A posttreatment H. pylori breath test was ordered but was not completed.   GI PROCEDURES:  EGD 02/24/2022: - Normal esophagus.  - Erythematous and granular mucosa in the gastric fundus and gastric body.  - Normal mucosa was found in the entire examined duodenum. Biopsied.  - Several biopsies were obtained on the greater curvature of the gastric body, on the lesser curvature of the gastric body, on the greater curvature of the gastric antrum and on the lesser curvature of the gastric antrum.  1. Surgical [P], duodenum bulb and 2nd portion of duodenum FRAGMENTS OF NORMAL DUODENAL MUCOSA. THERE ARE NO DIAGNOSTIC FEATURES OF CELIAC DISEASE. 2. Surgical [P], gastric antrum and gastric body CHRONIC EROSIVE GASTRITIS. ABUNDANT H. PYLORI ARE PRESENT (CONFIRMED BY IMMUNOSTAIN). NEGATIVE FOR DYSPLASIA AND MALIGNANCY. THE FOLLOWING IMMUNOSTAIN IS PERFORMED WITH APPROPRIATE CONTROL: H. PYLORI: POSITIVE. Prescribed treatment  as follows: Metronidazole  500 mg three times daily x 14 days;  Disp #42 tablets, RF zero  Bismuth  subsalicylate 525 mg four times daily x 14 days;  Disp #56 tablets, RF zero  Doxycycline   100 mg twice daily x 14 days;  Disp# 28 tablets, RF zero  Omeprazole  20 mg twice daily x 14 days (available over the counter)   Colonoscopy 07/12/2018 by Dr. Legrand: Normal colonoscopy Recall colonoscopy 10 years   Cologuard 04/21/2021: Negative  Past Medical History:  Diagnosis Date   Allergy    Anxiety    Arthritis    Blood transfusion without reported diagnosis    Cataract    Depression    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   Prostate cancer [facility])     Past Surgical History:  Procedure Laterality Date   BRONCHIAL BIOPSY  01/21/2024   Procedure: BRONCHOSCOPY, WITH BIOPSY;  Surgeon: Gretta Leita SQUIBB, DO;  Location: [facility];  Service: Cardiopulmonary;;   BRONCHIAL BRUSHINGS  01/21/2024   Procedure: BRONCHOSCOPY, WITH BRUSH BIOPSY;  Surgeon: Gretta Leita SQUIBB, DO;  Location: [facility];  Service: Cardiopulmonary;;   FLEXIBLE BRONCHOSCOPY N/A 01/21/2024   Procedure: ELLIOTT SIDE;  Surgeon: Gretta Leita SQUIBB, DO;  Location: [facility]OS[facility];  Service: Cardiopulmonary;  Laterality: N/A;   INGUINAL HERNIA REPAIR Right 09/18/2021   Procedure: OPEN RIGHT INGUINAL HERNIA REPAIR WITH MESH;  Surgeon: Kinsinger, Herlene Righter, MD;  Location: [facility];  Service: General;  Laterality: Right;   IR THORACENTESIS ASP PLEURAL SPACE W/IMG GUIDE  03/21/2024   KNEE CARTILAGE SURGERY Right    PROSTATE SURGERY     ROTATOR CUFF REPAIR Right     Prior to Admission medications   Medication Sig Start Date End Date Taking? Authorizing Provider  acetaminophen  (TYLENOL ) 500 MG tablet Take 2 tablets (1,000 mg total) by mouth every 8 (eight) hours. 02/15/24  Yes Ghimire, Donalda HERO, MD  albuterol  (VENTOLIN  HFA) 108 (90 Base) MCG/ACT inhaler Inhale 1 puff into the lungs every 6 (six) hours as needed for wheezing or shortness of breath. 12/23/23  Yes [provider]  benzonatate  (TESSALON ) 200 MG capsule Take 1 capsule (200 mg total) by mouth 3 (three) times daily. 03/28/24  Yes  Pickenpack-Cousar, Fannie SAILOR, NP  enoxaparin  (LOVENOX ) 60 MG/0.6ML injection Inject 0.6 mLs (60 mg total) into the skin 2 (two) times daily. 03/18/24 04/17/24 Yes Chatterjee, Srobona Tublu, MD  fenofibrate  160 MG tablet Take 160 mg by mouth daily.   Yes [provider]  ferrous sulfate 325 (65 FE) MG tablet Take 325 mg by mouth daily with breakfast.   Yes [provider]  HYDROcodone  bit-homatropine (HYCODAN) 5-1.5 MG/5ML syrup Take 5 mLs by mouth every 8 (eight) hours as needed for cough. 03/28/24  Yes Pickenpack-Cousar, Fannie SAILOR, NP  magic mouthwash (nystatin , diphenhydrAMINE , alum & mag hydroxide) suspension mixture Swish and swallow 10 mLs 4 (four) times daily as needed for mouth pain. 04/04/24  Yes Pickenpack-Cousar, Fannie SAILOR, NP  megestrol  (MEGACE ) 20 MG tablet Take 40 mg by mouth 4 (four) times daily -  with meals and at bedtime. 02/21/24  Yes [provider]  ondansetron  (ZOFRAN ) 8 MG tablet Take 0.5 tablets (4 mg total) by mouth every 8 (eight) hours as needed for nausea. 03/28/24  Yes Pickenpack-Cousar, Fannie SAILOR, NP  OVER THE COUNTER MEDICATION Apply 1 Application topically daily. Topical cream with unknown active ingredient/strength. Sample given by oncologist in office for daily use on radiation burn.   Yes  [provider]  oxyCODONE  (OXYCONTIN ) 10 mg 12 hr tablet Take 1 tablet (10 mg total) by mouth every 8 (eight) hours. 03/28/24  Yes Pickenpack-Cousar, Fannie SAILOR, NP  oxyCODONE  (ROXICODONE ) 15 MG immediate release tablet Take 1 tablet (15 mg total) by mouth every 4 (four) hours as needed. 03/28/24  Yes Pickenpack-Cousar, Fannie SAILOR, NP  pantoprazole  (PROTONIX ) 40 MG tablet Take 1 tablet (40 mg total) by mouth daily. 03/14/24  Yes Pickenpack-Cousar, Fannie SAILOR, NP  pembrolizumab  (KEYTRUDA ) 100 MG/4ML SOLN Inject 200 mg into the vein every 21 ( twenty-one) days.   Yes [provider]  polyethylene glycol powder (GLYCOLAX /MIRALAX ) 17 GM/SCOOP powder Take 17 g by  mouth daily. 02/15/24  Yes Ghimire, Donalda HERO, MD  rosuvastatin  (CRESTOR ) 10 MG tablet Take 10 mg by mouth daily. 12/23/23  Yes [provider]  senna-docusate (SENOKOT-S) 8.6-50 MG tablet Take 2 tablets by mouth at bedtime. 02/15/24  Yes Ghimire, Donalda HERO, MD  sucralfate  (CARAFATE ) 1 g tablet Take 1 tablet (1 g total) by mouth 4 (four) times daily -  with meals and at bedtime. 03/01/24  Yes Sherrod Sherrod, MD  gabapentin  (NEURONTIN ) 100 MG capsule Take 1 capsule (100 mg total) by mouth 2 (two) times daily. Patient not taking: Reported on 04/09/2024 04/05/24   Pickenpack-Cousar, Fannie SAILOR, NP    Current Facility-Administered Medications  Medication Dose Route Frequency Provider Last Rate Last Admin   acetaminophen  (TYLENOL ) tablet 650 mg  650 mg Oral Q6H PRN Zella, Mir M, MD   650 mg at 04/09/24 1606   Or   acetaminophen  (TYLENOL ) suppository 650 mg  650 mg Rectal Q6H PRN Zella, Mir M, MD       albuterol  (PROVENTIL ) (2.5 MG/3ML) 0.083% nebulizer solution 2.5 mg  2.5 mg Nebulization Q2H PRN Zella, Mir M, MD       Benzocaine  (HURRCAINE) 20 % mouth spray   Mouth/Throat TID PRN Ferolito, Michelle Y, NP       benzonatate  (TESSALON ) capsule 200 mg  200 mg Oral TID Ikramullah, Mir M, MD   200 mg at 04/10/24 2152   enoxaparin  (LOVENOX ) injection 50 mg  50 mg Subcutaneous Q12H Leotis Bogus, MD   50 mg at 04/10/24 2308   feeding supplement (ENSURE PLUS HIGH PROTEIN) liquid 237 mL  237 mL Oral BID BM Leotis Bogus, MD       gabapentin  (NEURONTIN ) capsule 100 mg  100 mg Oral BID Ferolito, Michelle Y, NP   100 mg at 04/10/24 2153   HYDROcodone  bit-homatropine (HYCODAN) 5-1.5 MG/5ML syrup 5 mL  5 mL Oral Q8H PRN Zella, Mir M, MD       HYDROmorphone  (DILAUDID ) injection 1 mg  1 mg Intravenous Q2H PRN Zella, Mir M, MD   1 mg at 04/11/24 9163   magic mouthwash  10 mL Oral QID PRN Zella Katha HERO, MD       megestrol  (MEGACE ) tablet 40 mg  40 mg Oral TID WC & HS Zella, Mir  M, MD   40 mg at 04/11/24 0818   ondansetron  (ZOFRAN ) tablet 4 mg  4 mg Oral Q6H PRN Zella Katha HERO, MD       Or   ondansetron  (ZOFRAN ) injection 4 mg  4 mg Intravenous Q6H PRN Zella, Mir M, MD   4 mg at 04/10/24 0006   oxyCODONE  (Oxy IR/ROXICODONE ) immediate release tablet 15 mg  15 mg Oral Q4H PRN Zella, Mir M, MD       oxyCODONE  (OXYCONTIN ) 12 hr tablet  10 mg  10 mg Oral Q8H Zella, Mir M, MD   10 mg at 04/11/24 9379   pantoprazole  (PROTONIX ) EC tablet 40 mg  40 mg Oral Daily Ferolito, Michelle Y, NP       polyethylene glycol (MIRALAX  / GLYCOLAX ) packet 17 g  17 g Oral Daily Zella, Mir M, MD   17 g at 04/10/24 0945   senna-docusate (Senokot-S) tablet 2 tablet  2 tablet Oral QHS Zella, Mir M, MD   2 tablet at 04/10/24 2153   sucralfate  (CARAFATE ) tablet 1 g  1 g Oral TID WC & HS Zella, Mir M, MD   1 g at 04/11/24 0818   traZODone  (DESYREL ) tablet 25 mg  25 mg Oral QHS PRN Zella Katha HERO, MD        Allergies as of 04/09/2024 - Review Complete 04/09/2024  Allergen Reaction Noted   Breyna  [budesonide -formoterol  fumarate] Cough 03/15/2024    Family History  Problem Relation Age of Onset   Sickle cell anemia Mother    Cirrhosis Mother    Alcohol abuse Mother    Bone cancer Father    Hypertension Sister    Hypertension Brother    Colon cancer Neg Hx    Esophageal cancer Neg Hx    Rectal cancer Neg Hx    Stomach cancer Neg Hx     Social History   Socioeconomic History   Marital status: Married    Spouse name: Not on file   Number of children: Not on file   Years of education: Not on file   Highest education level: Not on file  Occupational History   Not on file  Tobacco Use   Smoking status: Former    Types: E-cigarettes    Quit date: 05/09/2014    Years since quitting: 9.9   Smokeless tobacco: Never   Tobacco comments:    Quit 5 years ago   Vaping Use   Vaping status: Former   Quit date: 12/21/2023  Substance and Sexual Activity    Alcohol use: Yes    Alcohol/week: 6.0 standard drinks of alcohol    Types: 6 Cans of beer per week    Comment: every other day   Drug use: No   Sexual activity: Not on file  Other Topics Concern   Not on file  Social History Narrative   Not on file   Social Drivers of Health   Financial Resource Strain: Not on file  Food Insecurity: No Food Insecurity (04/09/2024)   Hunger Vital Sign    Worried About Running Out of Food in the Last Year: Never true    Ran Out of Food in the Last Year: Never true  Transportation Needs: No Transportation Needs (04/09/2024)   PRAPARE - Administrator, Civil Service (Medical): No    Lack of Transportation (Non-Medical): No  Physical Activity: Not on file  Stress: Not on file  Social Connections: Not on file  Intimate Partner Violence: Not At Risk (04/09/2024)   Humiliation, Afraid, Rape, and Kick questionnaire    Fear of Current or Ex-Partner: No    Emotionally Abused: No    Physically Abused: No    Sexually Abused: No   Review of Systems: Gen: Denies fever, sweats or chills. No weight loss.  CV: Denies chest pain, palpitations or edema. Resp:+ Cough. GI: See HPI. GU : Denies urinary burning, blood in urine, increased urinary frequency or incontinence. MS: Denies joint pain, muscles aches or weakness. Derm: Denies rash,  itchiness, skin lesions or unhealing ulcers. Psych: Denies depression, anxiety, memory loss or confusion. Heme: Denies easy bruising, bleeding. Neuro:  Denies headaches, dizziness or paresthesias. Endo:  Denies any problems with DM, thyroid  or adrenal function.  Physical Exam: Vital signs in last 24 hours: Temp:  [98.3 F (36.8 C)-100.6 F (38.1 C)] 99.5 F (37.5 C) (07/08 0416) Pulse Rate:  [113-131] 117 (07/08 0416) Resp:  [18-19] 19 (07/08 0416) BP: (114-139)/(67-77) 139/69 (07/08 0416) SpO2:  [90 %-96 %] 93 % (07/08 0416) Last BM Date : 04/09/24 General: Chronically ill-appearing 63 year old male in no  acute distress. Head:  Normocephalic and atraumatic. Eyes:  No scleral icterus. Conjunctiva pink. Ears:  Normal auditory acuity. Nose:  No deformity, discharge or lesions. Mouth:  Dentition intact. No ulcers or lesions.  Neck:  Supple. No lymphadenopathy or thyromegaly.  Lungs: Congested cough.  Breath sounds clear anteriorly.  On oxygen 2 L nasal cannula. Heart: Regular rate rhythm, no murmurs. Abdomen:, Nondistended.  Nontender.  Positive bowel sounds all 4 quadrants.  No palpable mass. Rectal: Deferred. Musculoskeletal:  Symmetrical without gross deformities.  Pulses:  Normal pulses noted. Extremities:  Without clubbing or edema. Neurologic:  Alert and  oriented x 4. No focal deficits.  Skin:  Intact without significant lesions or rashes. Psych:  Alert and cooperative. Normal mood and affect.  Intake/Output from previous day: 07/07 0701 - 07/08 0700 In: 540 [P.O.:540] Out: 1050 [Urine:1050] Intake/Output this shift: No intake/output data recorded.  Lab Results: Recent Labs    04/09/24 1237 04/10/24 0508  WBC 8.7 7.7  HGB 9.4* 7.6*  HCT 30.6* 24.8*  PLT 251 245   BMET Recent Labs    04/09/24 1237 04/10/24 0508  NA 139 139  K 3.5 3.5  CL 103 107  CO2 23 24  GLUCOSE 94 113*  BUN 17 15  CREATININE 0.70 0.67  CALCIUM  8.3* 7.9*   LFT No results for input(s): PROT, ALBUMIN, AST, ALT, ALKPHOS, BILITOT, BILIDIR, IBILI in the last 72 hours. PT/INR No results for input(s): LABPROT, INR in the last 72 hours. Hepatitis Panel No results for input(s): HEPBSAG, HCVAB, HEPAIGM, HEPBIGM in the last 72 hours.    Studies/Results: DG Chest Port 1 View Result Date: 04/09/2024 CLINICAL DATA:  Short of breath stage IV lung cancer EXAM: PORTABLE CHEST 1 VIEW COMPARISON:  Radiograph 03/21/2024, CT 03/15/2024. FINDINGS: Normal cardiac silhouette. Significant interval increase in density of RIGHT upper lobe mass. Nodule in the superior segment of LEFT  lower lobe also appears increased. No pneumothorax. No pleural fluid. No acute osseous abnormality. IMPRESSION: 1. Increased masslike consolidation in the RIGHT upper lobe. Differential includes progressive pulmonary infection versus progressive neoplasm versus a combination of both. 2. Increase in volume of LEFT lobe nodule suggests progressive lung cancer. Electronically Signed   By: Jackquline Boxer M.D.   On: 04/09/2024 12:51    IMPRESSION/PLAN:  63 year old male admitted with right sided throat pain and odynophagia with decreased oral intake and weight loss. No dysphagia.  Evaluated by speech pathologist, no evidence of oral dysphagia, radiation esophagitis suspected and further GI evaluation recommended. -Continue HurriCaine spray 3 times daily as needed -Continue Magic mouthwash 4 times daily as needed (prescribed for oral thrush) -Continue Pantoprazole  po 40mg  QD -Switch Carafate  to liquid suspension before every meal and nightly -Pain management and IV fluids per the hospitalist -Barium swallow study ordered by the hospitalist, await result -Endoscopic evaluation recommendations pending barium swallow study results. Possible EGD to rule out radiation vs candidiasis esophagitis -  Await further recommendations per Dr. Wilhelmenia -Recommend ENT consult during this hospital admission  Stage IV non-small cell RUL lung cancer with metastasis to bones and to lymph nodes s/p radiation and currently on Keytruda . Right pleural effusion status post thoracentesis 03/21/2024.  Followed by palliative care.  Iron deficiency anemia secondary to above.  Required blood transfusions as needed.  Last received a blood transfusion for hemoglobin level of 7.0 as an outpatient on 7/1. Admission Hg 9.4 -> 7.6.  Patient endorsed passing a black stool x 1 two days ago. On oral iron. -CBC - Monitor patient for GI bleeding  PE (previously on Eliquis ) and recent right external jugular vein thrombus 03/2024 on Lovenox     History of H. pylori gastritis or EGD 02/2022, treated. Post treatment: Breath test ordered, not completed.   Elida HERO Kennedy-Smith  04/11/2024, 11:00 AM

## 2024-04-11 NOTE — Plan of Care (Signed)

## 2024-04-11 NOTE — Consult Note (Cosign Needed Addendum)
 Referring Provider: Dr. Darcel Dawley Primary Care Physician:  [facility] Primary Gastroenterologist:  Dr. Victory Brand  Reason for Consultation:  Throat pain, decreased oral intake  HPI: Jeff Romero is a 63 y.o. 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 Pylori gastritis. S/P RIH surgery 09/2021.   He presented to the ED 04/09/2024 secondary to throat pain which has progressively worsened over the past two weeks with decreased p.o. intake and weight loss.  Labs in the ED showed a WBC count of 7.7.  Hemoglobin 7.6 down from 9.4 on 04/09/2024.  Prior Hemoglobin level 7.0 on 6/3, transfused 1 unit of PRBCs as an outpatient at the cancer center 04/04/2024. Chest x-ray 04/09/2024 showed increased masslike consolidation in the right upper lobe and increased volume of the left lobe nodule suggestive of progressive lung cancer.He is currently on Keytruda  for metastatic non-small cell lung cancer as noted above and his last radiation treatment was 03/31/2024.  He describes having pain specifically to the right side of his throat when he swallows any food, pills or liquid. He denies having any difficulty swallowing food, pills or liquid just has pain when he swallows. He takes Pantoprazole  40 mg daily at home and Carafate  1 g 4 times daily.  He previously did not experience any heartburn but for the past few days he has had mild heartburn.  No upper or lower abdominal pain. He was evaluated by speech pathologist, no evidence of oral dysphagia, possible radiation esophagitis suspected and further GI evaluation recommended. Patient's wife, who is a  respiratory therapist, requests for her husband to undergo an EGD during this hospital admission. He has intermittent constipation for which he takes MiraLAX  and senna as needed.  His last bowel movement was 2 days ago which he stated was black.  His wife stated he started taking oral iron 1 month ago.  No bright red blood per the rectum.  He underwent a colonoscopy 07/2018 which was normal.  He was initially seen in our GI clinic 12/08/2021 for further evaluation regarding iron deficiency anemia.  At that time, his iron levels were normal therefore oral iron supplement was discontinued.  Repeat labs 02/2022 confirmed recurrent IDA.  He underwent an EGD by Dr. Brand 02/24/2022 which identified H. pylori gastritis which was treated with metronidazole /doxycycline /bismuth  subsalicylate/omeprazole  x 14 days.  A posttreatment H. pylori breath test was ordered but was not completed.   GI PROCEDURES:  EGD 02/24/2022: - Normal esophagus.  - Erythematous and granular mucosa in the gastric fundus and gastric body.  - Normal mucosa was found in the entire examined duodenum. Biopsied.  - Several biopsies were obtained on the greater curvature of the gastric body, on the lesser curvature of the gastric body, on the greater curvature of the gastric antrum and on the lesser curvature of the gastric antrum.  1. Surgical [P], duodenum bulb and 2nd portion of duodenum FRAGMENTS OF NORMAL DUODENAL MUCOSA. THERE ARE NO DIAGNOSTIC FEATURES OF CELIAC DISEASE. 2. Surgical [P], gastric antrum and gastric body CHRONIC EROSIVE GASTRITIS. ABUNDANT H. PYLORI ARE PRESENT (CONFIRMED BY IMMUNOSTAIN). NEGATIVE FOR DYSPLASIA AND MALIGNANCY. THE FOLLOWING IMMUNOSTAIN IS PERFORMED WITH APPROPRIATE CONTROL: H. PYLORI: POSITIVE. Prescribed treatment  as follows: Metronidazole  500 mg three times daily x 14 days;  Disp #42 tablets, RF zero  Bismuth  subsalicylate 525 mg four times daily x 14 days;  Disp #56 tablets, RF zero  Doxycycline   100 mg twice daily x 14 days;  Disp# 28 tablets, RF zero  Omeprazole  20 mg twice daily x 14 days (available over the counter)   Colonoscopy 07/12/2018 by Dr. Legrand: Normal colonoscopy Recall colonoscopy 10 years   Cologuard 04/21/2021: Negative  Past Medical History:  Diagnosis Date   Allergy    Anxiety    Arthritis    Blood transfusion without reported diagnosis    Cataract    Depression    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   Prostate cancer [facility])     Past Surgical History:  Procedure Laterality Date   BRONCHIAL BIOPSY  01/21/2024   Procedure: BRONCHOSCOPY, WITH BIOPSY;  Surgeon: Gretta Leita SQUIBB, DO;  Location: [facility] ENDOSCOPY;  Service: Cardiopulmonary;;   BRONCHIAL BRUSHINGS  01/21/2024   Procedure: BRONCHOSCOPY, WITH BRUSH BIOPSY;  Surgeon: Gretta Leita SQUIBB, DO;  Location: [facility] ENDOSCOPY;  Service: Cardiopulmonary;;   FLEXIBLE BRONCHOSCOPY N/A 01/21/2024   Procedure: ELLIOTT SIDE;  Surgeon: Gretta Leita SQUIBB, DO;  Location: [facility] ENDOSCOPY;  Service: Cardiopulmonary;  Laterality: N/A;   INGUINAL HERNIA REPAIR Right 09/18/2021   Procedure: OPEN RIGHT INGUINAL HERNIA REPAIR WITH MESH;  Surgeon: Kinsinger, Herlene Righter, MD;  Location: [facility] ORS;  Service: General;  Laterality: Right;   IR THORACENTESIS ASP PLEURAL SPACE W/IMG GUIDE  03/21/2024   KNEE CARTILAGE SURGERY Right    PROSTATE SURGERY     ROTATOR CUFF REPAIR Right     Prior to Admission medications   Medication Sig Start Date End Date Taking? Authorizing Provider  acetaminophen  (TYLENOL ) 500 MG tablet Take 2 tablets (1,000 mg total) by mouth every 8 (eight) hours. 02/15/24  Yes Ghimire, Donalda HERO, MD  albuterol  (VENTOLIN  HFA) 108 (90 Base) MCG/ACT inhaler Inhale 1 puff into the lungs every 6 (six) hours as needed for wheezing or shortness of breath. 12/23/23  Yes [provider]  benzonatate  (TESSALON ) 200 MG capsule Take 1 capsule (200 mg total) by mouth 3 (three) times daily. 03/28/24  Yes  Pickenpack-Cousar, Fannie SAILOR, NP  enoxaparin  (LOVENOX ) 60 MG/0.6ML injection Inject 0.6 mLs (60 mg total) into the skin 2 (two) times daily. 03/18/24 04/17/24 Yes Chatterjee, Srobona Tublu, MD  fenofibrate  160 MG tablet Take 160 mg by mouth daily.   Yes [provider]  ferrous sulfate 325 (65 FE) MG tablet Take 325 mg by mouth daily with breakfast.   Yes [provider]  HYDROcodone  bit-homatropine (HYCODAN) 5-1.5 MG/5ML syrup Take 5 mLs by mouth every 8 (eight) hours as needed for cough. 03/28/24  Yes Pickenpack-Cousar, Fannie SAILOR, NP  magic mouthwash (nystatin , diphenhydrAMINE , alum & mag hydroxide) suspension mixture Swish and swallow 10 mLs 4 (four) times daily as needed for mouth pain. 04/04/24  Yes Pickenpack-Cousar, Fannie SAILOR, NP  megestrol  (MEGACE ) 20 MG tablet Take 40 mg by mouth 4 (four) times daily -  with meals and at bedtime. 02/21/24  Yes [provider]  ondansetron  (ZOFRAN ) 8 MG tablet Take 0.5 tablets (4 mg total) by mouth every 8 (eight) hours as needed for nausea. 03/28/24  Yes Pickenpack-Cousar, Fannie SAILOR, NP  OVER THE COUNTER MEDICATION Apply 1 Application topically daily. Topical cream with unknown active ingredient/strength. Sample given by oncologist in office for daily use on radiation burn.   Yes  [provider]  oxyCODONE  (OXYCONTIN ) 10 mg 12 hr tablet Take 1 tablet (10 mg total) by mouth every 8 (eight) hours. 03/28/24  Yes Pickenpack-Cousar, Fannie SAILOR, NP  oxyCODONE  (ROXICODONE ) 15 MG immediate release tablet Take 1 tablet (15 mg total) by mouth every 4 (four) hours as needed. 03/28/24  Yes Pickenpack-Cousar, Fannie SAILOR, NP  pantoprazole  (PROTONIX ) 40 MG tablet Take 1 tablet (40 mg total) by mouth daily. 03/14/24  Yes Pickenpack-Cousar, Fannie SAILOR, NP  pembrolizumab  (KEYTRUDA ) 100 MG/4ML SOLN Inject 200 mg into the vein every 21 ( twenty-one) days.   Yes [provider]  polyethylene glycol powder (GLYCOLAX /MIRALAX ) 17 GM/SCOOP powder Take 17 g by  mouth daily. 02/15/24  Yes Ghimire, Donalda HERO, MD  rosuvastatin  (CRESTOR ) 10 MG tablet Take 10 mg by mouth daily. 12/23/23  Yes [provider]  senna-docusate (SENOKOT-S) 8.6-50 MG tablet Take 2 tablets by mouth at bedtime. 02/15/24  Yes Ghimire, Donalda HERO, MD  sucralfate  (CARAFATE ) 1 g tablet Take 1 tablet (1 g total) by mouth 4 (four) times daily -  with meals and at bedtime. 03/01/24  Yes Sherrod Sherrod, MD  gabapentin  (NEURONTIN ) 100 MG capsule Take 1 capsule (100 mg total) by mouth 2 (two) times daily. Patient not taking: Reported on 04/09/2024 04/05/24   Pickenpack-Cousar, Fannie SAILOR, NP    Current Facility-Administered Medications  Medication Dose Route Frequency Provider Last Rate Last Admin   acetaminophen  (TYLENOL ) tablet 650 mg  650 mg Oral Q6H PRN Zella, Mir M, MD   650 mg at 04/09/24 1606   Or   acetaminophen  (TYLENOL ) suppository 650 mg  650 mg Rectal Q6H PRN Zella, Mir M, MD       albuterol  (PROVENTIL ) (2.5 MG/3ML) 0.083% nebulizer solution 2.5 mg  2.5 mg Nebulization Q2H PRN Zella, Mir M, MD       Benzocaine  (HURRCAINE) 20 % mouth spray   Mouth/Throat TID PRN Ferolito, Michelle Y, NP       benzonatate  (TESSALON ) capsule 200 mg  200 mg Oral TID Ikramullah, Mir M, MD   200 mg at 04/10/24 2152   enoxaparin  (LOVENOX ) injection 50 mg  50 mg Subcutaneous Q12H Leotis Bogus, MD   50 mg at 04/10/24 2308   feeding supplement (ENSURE PLUS HIGH PROTEIN) liquid 237 mL  237 mL Oral BID BM Leotis Bogus, MD       gabapentin  (NEURONTIN ) capsule 100 mg  100 mg Oral BID Ferolito, Michelle Y, NP   100 mg at 04/10/24 2153   HYDROcodone  bit-homatropine (HYCODAN) 5-1.5 MG/5ML syrup 5 mL  5 mL Oral Q8H PRN Zella, Mir M, MD       HYDROmorphone  (DILAUDID ) injection 1 mg  1 mg Intravenous Q2H PRN Zella, Mir M, MD   1 mg at 04/11/24 9163   magic mouthwash  10 mL Oral QID PRN Zella Katha HERO, MD       megestrol  (MEGACE ) tablet 40 mg  40 mg Oral TID WC & HS Zella, Mir  M, MD   40 mg at 04/11/24 0818   ondansetron  (ZOFRAN ) tablet 4 mg  4 mg Oral Q6H PRN Zella Katha HERO, MD       Or   ondansetron  (ZOFRAN ) injection 4 mg  4 mg Intravenous Q6H PRN Zella, Mir M, MD   4 mg at 04/10/24 0006   oxyCODONE  (Oxy IR/ROXICODONE ) immediate release tablet 15 mg  15 mg Oral Q4H PRN Zella, Mir M, MD       oxyCODONE  (OXYCONTIN ) 12 hr tablet  10 mg  10 mg Oral Q8H Zella, Mir M, MD   10 mg at 04/11/24 9379   pantoprazole  (PROTONIX ) EC tablet 40 mg  40 mg Oral Daily Ferolito, Michelle Y, NP       polyethylene glycol (MIRALAX  / GLYCOLAX ) packet 17 g  17 g Oral Daily Zella, Mir M, MD   17 g at 04/10/24 0945   senna-docusate (Senokot-S) tablet 2 tablet  2 tablet Oral QHS Zella, Mir M, MD   2 tablet at 04/10/24 2153   sucralfate  (CARAFATE ) tablet 1 g  1 g Oral TID WC & HS Zella, Mir M, MD   1 g at 04/11/24 0818   traZODone  (DESYREL ) tablet 25 mg  25 mg Oral QHS PRN Zella Katha HERO, MD        Allergies as of 04/09/2024 - Review Complete 04/09/2024  Allergen Reaction Noted   Breyna  [budesonide -formoterol  fumarate] Cough 03/15/2024    Family History  Problem Relation Age of Onset   Sickle cell anemia Mother    Cirrhosis Mother    Alcohol abuse Mother    Bone cancer Father    Hypertension Sister    Hypertension Brother    Colon cancer Neg Hx    Esophageal cancer Neg Hx    Rectal cancer Neg Hx    Stomach cancer Neg Hx     Social History   Socioeconomic History   Marital status: Married    Spouse name: Not on file   Number of children: Not on file   Years of education: Not on file   Highest education level: Not on file  Occupational History   Not on file  Tobacco Use   Smoking status: Former    Types: E-cigarettes    Quit date: 05/09/2014    Years since quitting: 9.9   Smokeless tobacco: Never   Tobacco comments:    Quit 5 years ago   Vaping Use   Vaping status: Former   Quit date: 12/21/2023  Substance and Sexual Activity    Alcohol use: Yes    Alcohol/week: 6.0 standard drinks of alcohol    Types: 6 Cans of beer per week    Comment: every other day   Drug use: No   Sexual activity: Not on file  Other Topics Concern   Not on file  Social History Narrative   Not on file   Social Drivers of Health   Financial Resource Strain: Not on file  Food Insecurity: No Food Insecurity (04/09/2024)   Hunger Vital Sign    Worried About Running Out of Food in the Last Year: Never true    Ran Out of Food in the Last Year: Never true  Transportation Needs: No Transportation Needs (04/09/2024)   PRAPARE - Administrator, Civil Service (Medical): No    Lack of Transportation (Non-Medical): No  Physical Activity: Not on file  Stress: Not on file  Social Connections: Not on file  Intimate Partner Violence: Not At Risk (04/09/2024)   Humiliation, Afraid, Rape, and Kick questionnaire    Fear of Current or Ex-Partner: No    Emotionally Abused: No    Physically Abused: No    Sexually Abused: No   Review of Systems: Gen: Denies fever, sweats or chills. No weight loss.  CV: Denies chest pain, palpitations or edema. Resp:+ Cough. GI: See HPI. GU : Denies urinary burning, blood in urine, increased urinary frequency or incontinence. MS: Denies joint pain, muscles aches or weakness. Derm: Denies rash,  itchiness, skin lesions or unhealing ulcers. Psych: Denies depression, anxiety, memory loss or confusion. Heme: Denies easy bruising, bleeding. Neuro:  Denies headaches, dizziness or paresthesias. Endo:  Denies any problems with DM, thyroid  or adrenal function.  Physical Exam: Vital signs in last 24 hours: Temp:  [98.3 F (36.8 C)-100.6 F (38.1 C)] 99.5 F (37.5 C) (07/08 0416) Pulse Rate:  [113-131] 117 (07/08 0416) Resp:  [18-19] 19 (07/08 0416) BP: (114-139)/(67-77) 139/69 (07/08 0416) SpO2:  [90 %-96 %] 93 % (07/08 0416) Last BM Date : 04/09/24 General: Chronically ill-appearing 63 year old male in no  acute distress. Head:  Normocephalic and atraumatic. Eyes:  No scleral icterus. Conjunctiva pink. Ears:  Normal auditory acuity. Nose:  No deformity, discharge or lesions. Mouth:  Dentition intact. No ulcers or lesions.  Neck:  Supple. No lymphadenopathy or thyromegaly.  Lungs: Congested cough.  Breath sounds clear anteriorly.  On oxygen 2 L nasal cannula. Heart: Regular rate rhythm, no murmurs. Abdomen:, Nondistended.  Nontender.  Positive bowel sounds all 4 quadrants.  No palpable mass. Rectal: Deferred. Musculoskeletal:  Symmetrical without gross deformities.  Pulses:  Normal pulses noted. Extremities:  Without clubbing or edema. Neurologic:  Alert and  oriented x 4. No focal deficits.  Skin:  Intact without significant lesions or rashes. Psych:  Alert and cooperative. Normal mood and affect.  Intake/Output from previous day: 07/07 0701 - 07/08 0700 In: 540 [P.O.:540] Out: 1050 [Urine:1050] Intake/Output this shift: No intake/output data recorded.  Lab Results: Recent Labs    04/09/24 1237 04/10/24 0508  WBC 8.7 7.7  HGB 9.4* 7.6*  HCT 30.6* 24.8*  PLT 251 245   BMET Recent Labs    04/09/24 1237 04/10/24 0508  NA 139 139  K 3.5 3.5  CL 103 107  CO2 23 24  GLUCOSE 94 113*  BUN 17 15  CREATININE 0.70 0.67  CALCIUM  8.3* 7.9*   LFT No results for input(s): PROT, ALBUMIN, AST, ALT, ALKPHOS, BILITOT, BILIDIR, IBILI in the last 72 hours. PT/INR No results for input(s): LABPROT, INR in the last 72 hours. Hepatitis Panel No results for input(s): HEPBSAG, HCVAB, HEPAIGM, HEPBIGM in the last 72 hours.    Studies/Results: DG Chest Port 1 View Result Date: 04/09/2024 CLINICAL DATA:  Short of breath stage IV lung cancer EXAM: PORTABLE CHEST 1 VIEW COMPARISON:  Radiograph 03/21/2024, CT 03/15/2024. FINDINGS: Normal cardiac silhouette. Significant interval increase in density of RIGHT upper lobe mass. Nodule in the superior segment of LEFT  lower lobe also appears increased. No pneumothorax. No pleural fluid. No acute osseous abnormality. IMPRESSION: 1. Increased masslike consolidation in the RIGHT upper lobe. Differential includes progressive pulmonary infection versus progressive neoplasm versus a combination of both. 2. Increase in volume of LEFT lobe nodule suggests progressive lung cancer. Electronically Signed   By: Jackquline Boxer M.D.   On: 04/09/2024 12:51    IMPRESSION/PLAN:  63 year old male admitted with right sided throat pain and odynophagia with decreased oral intake and weight loss. No dysphagia.  Evaluated by speech pathologist, no evidence of oral dysphagia, radiation esophagitis suspected and further GI evaluation recommended. -Continue HurriCaine spray 3 times daily as needed -Continue Magic mouthwash 4 times daily as needed (prescribed for oral thrush) -Continue Pantoprazole  po 40mg  QD -Switch Carafate  to liquid suspension before every meal and nightly -Pain management and IV fluids per the hospitalist -Barium swallow study ordered by the hospitalist, await result -Endoscopic evaluation recommendations pending barium swallow study results. Possible EGD to rule out radiation vs candidiasis esophagitis -  Await further recommendations per Dr. Wilhelmenia -Recommend ENT consult during this hospital admission  Stage IV non-small cell RUL lung cancer with metastasis to bones and to lymph nodes s/p radiation and currently on Keytruda . Right pleural effusion status post thoracentesis 03/21/2024.  Followed by palliative care.  Iron deficiency anemia secondary to above.  Required blood transfusions as needed.  Last received a blood transfusion for hemoglobin level of 7.0 as an outpatient on 7/1. Admission Hg 9.4 -> 7.6.  Patient endorsed passing a black stool x 1 two days ago. On oral iron. -CBC - Monitor patient for GI bleeding  PE (previously on Eliquis ) and recent right external jugular vein thrombus 03/2024 on Lovenox     History of H. pylori gastritis or EGD 02/2022, treated. Post treatment: Breath test ordered, not completed.   Elida HERO Kennedy-Smith  04/11/2024, 11:00 AM

## 2024-04-11 NOTE — Plan of Care (Signed)

## 2024-04-11 NOTE — Progress Notes (Signed)
 Palliative Medicine Inpatient Follow Up Note HPI: This 63 yrs old unfortunate male with medical history significant for stage IV non-small cell lung cancer,  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 Palliative care has been asked to assist with additional goals of care conversations.   Today's Discussion 04/11/2024  *Please note that this is a verbal dictation therefore any spelling or grammatical errors are due to the Dragon Medical One system interpretation.  Chart reviewed inclusive of vital signs, progress notes, laboratory results, and diagnostic images.   I met with Jeff Romero at bedside in the company of his spouse, [name]. He shares that he does have improvement in his throat pain with the IV dilaudid  and has been able to take in more food and fluid today. We discussed changing his as needed pain medication from oxycodone  to dilaudid  as this does seem to help him most. We also discussed giving this 30 minutes prior to meals to see if there is an effect on pain with oral intake. We discussed if it neglects to help considering other options. Ptient shares at home he was taking magic mouthwash which did not seem to help him.  [name] does complain of dry mount - we discussed adding biotene also.   During my time at bedside patient was assessed by the registered nutrition who shares recommendations for higher calorie juices and perhaps to veer away from dairy products.   We discussed if the interventions are not effective at controlling pain and Johnson neglects to be able to eat sufficiently then considering a PEG may be necessary.  Created space and opportunity for patient to explore thoughts feelings and fears regarding current medical situation.  Questions and concerns addressed/Palliative Support Provided.   Objective  Assessment: Vital Signs Vitals:   04/11/24 0024 04/11/24 0416  BP:  139/69  Pulse:  (!) 117  Resp:  19  Temp: 99.6 F (37.6 C) 99.5 F (37.5 C)  SpO2:  93%    Intake/Output Summary (Last 24 hours) at 04/11/2024 1245 Last data filed at 04/11/2024 1053 Gross per 24 hour  Intake 720 ml  Output 825 ml  Net -105 ml   Last Weight  Most recent update: 04/09/2024  4:03 PM    Weight  53.4 kg (117 lb 11.6 oz)            Gen:  Older AA M chronically ill appearing HEENT: moist mucous membranes CV: Regular rate and rhythm  PULM: On RA, breathing is nonlabored ABD: soft/nontender  EXT: (+) Muscle wasting Neuro: Alert and oriented x3   SUMMARY OF RECOMMENDATIONS   FULL CODE   Symptom management as below   Ongoing PMT support   Code Status/Advance Care Planning: FULL CODE   Symptom Management:  Cough, chronic: - Continue Tessalon  200mg  PO TID - Continue Hycodain 5ml Q8H PRN - Continue Gabapentin  100mg  PO BID - Suction to be set up at bedside for expectorant   Throat Pain: - Continue Oxycontin  10mg  PO Q8H - Start dilaudid  4mg  PRN Q$H as needed and 30 minutes prior to meals   - Continue IV dilaudid  PRN for severe breakthrough pain - Protonix  40mg  Po Qday - Continue carafate  prior to meals to coat esophagus - Hurricaine spray TID PRN - Patient does not feel magic mouthwash helps - GI is involved and esophagram is pending   Weight Loss, Adult  FTT: - Nutrition involvement - Continue megace  40mg  PO TID  Dry Mouth: - Biotene mouth wash as needed ______________________________________________________________________________________ Rosaline Becton [place] Palliative Medicine Team Team Cell Phone: [phone] Please utilize secure chat with additional questions, if there is no response within 30 minutes please call the above phone number   Time Spent: 50 Billing based on MDM: High  Palliative Medicine Team providers are available by phone from 7am to 7pm daily  and can be reached through the team cell phone.  Should this patient require assistance outside of these hours, please call the patient's attending physician.

## 2024-04-11 NOTE — Progress Notes (Addendum)
 Initial Nutrition Assessment  DOCUMENTATION CODES:   Severe malnutrition in context of chronic illness, Underweight  INTERVENTION:   -Boost Breeze po TID, each supplement provides 250 kcal and 9 grams of protein  -Kate [employer]ms 1.4 PO BID, each provides 455 kcals and 20g protein  -Prosource Plus PO TID, each provides 100 kcals and 15g protein  -mixed with applesauce  -Consider PEG tube if unable to increase PO intakes  -Provided handout on Sore Mouth and Throat  NUTRITION DIAGNOSIS:   Severe Malnutrition related to chronic illness, cancer and cancer related treatments as evidenced by severe fat depletion, severe muscle depletion, energy intake < or equal to 50% for > or equal to 1 month, percent weight loss.  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  Patient in room, wife and Palliative care provider at bedside.  Pt reports main source of pain is when he swallows. Water has caused him the most pain while other liquids like juice, ice cream, Ensure and applesauce are not as bad. Wants to stay away from dairy at this time as it has caused his secretions to worsen. Pt likes Boost Breeze so willing to switch to this option. Willing to try plant based shakes Mallie Pinion and Prosource plus to add to applesauce. Plan is to try and maximize options orally but if unable to increase PO intakes, PEG was discussed as an option. Will monitor for plan of care.  Provided handout that provides list of foods most appropriate to consume  when dealing with mouth or throat pain.  Per GI note, plan is for pt to have EGD tomorrow to assess for radiation vs candidiasis esophagitis. Per pt's wife, pt had been tolerating radiation prior to last treatment which was his last.  Palliative plans to order Dilaudid  PRN prior to meals to allow pt to eat. Pt was able to consume eggs and grits this morning following Dilaudid  dose.    Per wife, UBW ~145 lbs. Per weight records, pt has lost 18 lbs since 4/18 (13% wt loss x 2.5 months, significant for time frame).  Medications: Megace , Miralax , Senokot, Carafate  Labs reviewed.  NUTRITION - FOCUSED PHYSICAL EXAM:  Flowsheet Row Most Recent Value  Orbital Region Severe depletion  Upper Arm Region Severe depletion  Thoracic and Lumbar Region Unable to assess  Buccal Region Severe depletion  Temple Region Severe depletion  Clavicle Bone Region Severe depletion  Clavicle and Acromion Bone Region Severe depletion  Scapular Bone Region Severe depletion  Dorsal Hand Severe depletion  Patellar Region Severe depletion  Anterior Thigh Region Severe depletion  Posterior Calf Region Severe depletion  Edema (RD Assessment) None  Hair Reviewed  Eyes Reviewed  Mouth Reviewed  [build up on tongue]  Skin Reviewed  Nails Reviewed    Diet Order:   Diet Order             Diet NPO time specified  Diet effective midnight           Diet regular Room service appropriate? Yes; Fluid consistency: Thin  Diet effective  now                   EDUCATION NEEDS:   Education needs have been addressed  Skin:  Skin Assessment: Reviewed RN Assessment  Last BM:  7/6  Height:   Ht Readings from Last 1 Encounters:  04/09/24 5' 7 (1.702 m)    Weight:   Wt Readings from Last 1 Encounters:  04/09/24 53.4 kg    BMI:  Body mass index is 18.44 kg/m.  Estimated Nutritional Needs:   Kcal:  2050-2250  Protein:  100-115g  Fluid:  2L/day   Morna Lee, MS, RD, LDN Inpatient Clinical  Dietitian Contact via Secure chat

## 2024-04-12 ENCOUNTER — Inpatient Hospital Stay (HOSPITAL_COMMUNITY): Admitting: Anesthesiology

## 2024-04-12 ENCOUNTER — Encounter (HOSPITAL_COMMUNITY)
Admission: EM | Disposition: A | Discharge status: Home / Self Care | Payer: Self-pay | Source: Home / Self Care | Attending: Internal Medicine

## 2024-04-12 ENCOUNTER — Encounter (HOSPITAL_COMMUNITY): Payer: Self-pay | Admitting: Internal Medicine

## 2024-04-12 ENCOUNTER — Inpatient Hospital Stay

## 2024-04-12 DIAGNOSIS — I2699 Other pulmonary embolism without acute cor pulmonale: Secondary | ICD-10-CM

## 2024-04-12 DIAGNOSIS — Z515 Encounter for palliative care: Secondary | ICD-10-CM | POA: Diagnosis not present

## 2024-04-12 DIAGNOSIS — K2210 Ulcer of esophagus without bleeding: Secondary | ICD-10-CM

## 2024-04-12 DIAGNOSIS — K3189 Other diseases of stomach and duodenum: Secondary | ICD-10-CM

## 2024-04-12 DIAGNOSIS — G893 Neoplasm related pain (acute) (chronic): Secondary | ICD-10-CM | POA: Diagnosis not present

## 2024-04-12 DIAGNOSIS — Z7189 Other specified counseling: Secondary | ICD-10-CM | POA: Diagnosis not present

## 2024-04-12 DIAGNOSIS — K2289 Other specified disease of esophagus: Secondary | ICD-10-CM | POA: Diagnosis not present

## 2024-04-12 DIAGNOSIS — F418 Other specified anxiety disorders: Secondary | ICD-10-CM

## 2024-04-12 DIAGNOSIS — K449 Diaphragmatic hernia without obstruction or gangrene: Secondary | ICD-10-CM

## 2024-04-12 LAB — COMPREHENSIVE METABOLIC PANEL WITH GFR
ALT: 161 U/L — ABNORMAL HIGH (ref 0–44)
AST: 249 U/L — ABNORMAL HIGH (ref 15–41)
Albumin: <1.5 g/dL — ABNORMAL LOW (ref 3.5–5.0)
Alkaline Phosphatase: 221 U/L — ABNORMAL HIGH (ref 38–126)
Anion gap: 8 (ref 5–15)
BUN: 13 mg/dL (ref 8–23)
CO2: 25 mmol/L (ref 22–32)
Calcium: 8 mg/dL — ABNORMAL LOW (ref 8.9–10.3)
Chloride: 103 mmol/L (ref 98–111)
Creatinine, Ser: 0.65 mg/dL (ref 0.61–1.24)
GFR, Estimated: >60 mL/min (ref >60)
Glucose, Bld: 108 mg/dL — ABNORMAL HIGH (ref 70–99)
Potassium: 3.4 mmol/L — ABNORMAL LOW (ref 3.5–5.1)
Sodium: 136 mmol/L (ref 135–145)
Total Bilirubin: 0.8 mg/dL (ref 0.0–1.2)
Total Protein: 5.5 g/dL — ABNORMAL LOW (ref 6.5–8.1)

## 2024-04-12 LAB — CBC
HCT: 24.7 % — ABNORMAL LOW (ref 39.0–52.0)
Hemoglobin: 7.4 g/dL — ABNORMAL LOW (ref 13.0–17.0)
MCH: 24.7 pg — ABNORMAL LOW (ref 26.0–34.0)
MCHC: 30 g/dL (ref 30.0–36.0)
MCV: 82.3 fL (ref 80.0–100.0)
Platelets: 241 K/uL (ref 150–400)
RBC: 3 MIL/uL — ABNORMAL LOW (ref 4.22–5.81)
RDW: 21.9 % — ABNORMAL HIGH (ref 11.5–15.5)
WBC: 7.9 K/uL (ref 4.0–10.5)
nRBC: 0 % (ref 0.0–0.2)

## 2024-04-12 LAB — IRON AND TIBC
Iron: 25 ug/dL — ABNORMAL LOW (ref 45–182)
Saturation Ratios: 18 % (ref 17.9–39.5)
TIBC: 139 ug/dL — ABNORMAL LOW (ref 250–450)
UIBC: 114 ug/dL

## 2024-04-12 LAB — VITAMIN B12: Vitamin B-12: 932 pg/mL — ABNORMAL HIGH (ref 180–914)

## 2024-04-12 LAB — FERRITIN: Ferritin: 5127 ng/mL — ABNORMAL HIGH (ref 24–336)

## 2024-04-12 LAB — FOLATE: Folate: 6.1 ng/mL (ref >5.9)

## 2024-04-12 SURGERY — EGD (ESOPHAGOGASTRODUODENOSCOPY)
Anesthesia: Monitor Anesthesia Care

## 2024-04-12 MED ORDER — SODIUM CHLORIDE 0.9 % IV SOLN: INTRAVENOUS | Status: DC | PRN

## 2024-04-12 MED ORDER — SODIUM CHLORIDE 0.9 % IV SOLN: INTRAVENOUS | Status: DC

## 2024-04-12 MED ORDER — PROPOFOL 500 MG/50ML IV EMUL
INTRAVENOUS | Status: DC | PRN
  Administered 2024-04-12: 120 ug/kg/min via INTRAVENOUS

## 2024-04-12 MED ORDER — PROPOFOL 1000 MG/100ML IV EMUL
INTRAVENOUS | Status: AC
  Filled 2024-04-12: qty 100

## 2024-04-12 MED ORDER — ORAL CARE MOUTH RINSE: 15.0000 mL | OROMUCOSAL | Status: DC | PRN

## 2024-04-12 MED ORDER — PROPOFOL 10 MG/ML IV BOLUS
INTRAVENOUS | Status: DC | PRN
Start: 2024-04-12 — End: 2024-04-12
  Administered 2024-04-12: 60 mg via INTRAVENOUS
  Administered 2024-04-12: 40 mg via INTRAVENOUS

## 2024-04-12 MED ORDER — PHENYLEPHRINE 80 MCG/ML (10ML) SYRINGE FOR IV PUSH (FOR BLOOD PRESSURE SUPPORT)
PREFILLED_SYRINGE | INTRAVENOUS | Status: DC | PRN
Start: 2024-04-12 — End: 2024-04-12
  Administered 2024-04-12: 160 ug via INTRAVENOUS

## 2024-04-12 NOTE — Interval H&P Note (Signed)
 History and Physical Interval Note:  04/12/2024 2:41 PM  Jeff Romero  has presented today for surgery, with the diagnosis of Odynophgia.  The various methods of treatment have been discussed with the patient and family. After consideration of risks, benefits and other options for treatment, the patient has consented to  Procedure(s): EGD (ESOPHAGOGASTRODUODENOSCOPY) (N/A) as a surgical intervention.  The patient's history has been reviewed, patient examined, no change in status, stable for surgery.  I have reviewed the patient's chart and labs.  Questions were answered to the patient's satisfaction.    The risks and benefits of endoscopic evaluation were discussed with the patient; these include but are not limited to the risk of perforation, infection, bleeding, missed lesions, lack of diagnosis, severe illness requiring hospitalization, as well as anesthesia and sedation related illnesses.  The patient and/or family is agreeable to proceed.     Amellia Panik Mansouraty Jr

## 2024-04-12 NOTE — Progress Notes (Addendum)
 Palliative Medicine Inpatient Follow Up Note HPI: This 63 yrs old unfortunate male with medical history significant for stage IV non-small cell lung cancer,  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 Palliative care has been asked to assist with additional goals of care conversations.   Today's Discussion 04/12/2024  *Please note that this is a verbal dictation therefore any spelling or grammatical errors are due to the Dragon Medical One system interpretation.  Chart reviewed inclusive of vital signs, progress notes, laboratory results, and diagnostic images.   I met with Jeff Romero at bedside this morning. He shares he was able to eat more yesterday and the pain has had some degree of improvement. He shares he thinks the dilaudid  regiment is helping as is the gabapentin .   We reviewed the plan for an EGD today to further assess patients throat pain and identify if he has esophagitis.   Chronic cough has remained about the same to mildly improved her Jeff Romero.   Discussed the plan to continue present measures at this time and re-assess daily to see if modifications need to be made.   Reviewed the importance of daily mobility.  ___________________ Addendum:  I met with Jeff Romero and his wife, Jeff Romero. We reviewed so far improvements have been made from the perspective of pain and nutrition.   Discussed plan for EGD today which hopefully will better help determine if Jeff Romero has esophagitis.   Questions and concerns addressed/Palliative Support Provided.   Add. time:   Objective Assessment: Vital Signs Vitals:   04/12/24 0735 04/12/24 0942  BP: 120/65 109/62  Pulse: (!) 117 (!) 115  Resp: 16 16  Temp: 98.2 F (36.8 C) 98.6 F (37 C)  SpO2: 90% 90%    Intake/Output Summary (Last 24 hours) at 04/12/2024 1051 Last data filed  at 04/12/2024 0603 Gross per 24 hour  Intake 240 ml  Output 325 ml  Net -85 ml   Last Weight  Most recent update: 04/09/2024  4:03 PM    Weight  53.4 kg (117 lb 11.6 oz)            Gen:  Older AA M chronically ill appearing HEENT: moist mucous membranes CV: Regular rate and rhythm  PULM: On RA, breathing is nonlabored ABD: soft/nontender  EXT: (+) Muscle wasting Neuro: Alert and oriented x3   SUMMARY OF RECOMMENDATIONS   FULL CODE   Symptom management as below   Ongoing PMT support   Code Status/Advance Care Planning: FULL CODE   Symptom Management:  Cough, chronic: - Continue Tessalon  200mg  PO TID - Continue Hycodain 5ml Q8H PRN - Continue Gabapentin  100mg  PO BID - Suction  set up at bedside for expectorant   Throat Pain: - Continue Oxycontin  10mg  PO Q8H --> Will further assess opioid needs and likely increase to 15mg  Q8H TID tomorrow - Continue dilaudid  4mg  PRN Q4H as needed and 30 minutes prior to meals   - Continue IV dilaudid  PRN for severe breakthrough pain - Protonix  40mg  Po Qday - Continue carafate  prior to meals to coat esophagus - Hurricaine spray TID PRN - Patient does not feel magic mouthwash helps though it is ordered if desires - GI is involved, plan for EGD today - ENT consult has been recommended by GI   Weight Loss, Adult FTT: - Nutrition involvement - Continue megace  40mg  PO TID  Dry Mouth: -  Biotene mouth wash as needed ______________________________________________________________________________________ Rosaline Becton [place] Palliative Medicine Team Team Cell Phone: [phone] Please utilize secure chat with additional questions, if there is no response within 30 minutes please call the above phone number   Time Spent: 46 Billing based on MDM: High  Palliative Medicine Team providers are available by phone from 7am to 7pm daily and can be reached through the team cell phone.  Should this patient require assistance outside of  these hours, please call the patient's attending physician.

## 2024-04-12 NOTE — Plan of Care (Signed)
  Problem: Education: Goal: Knowledge of General Education information will improve Description: Including pain rating scale, medication(s)/side effects and non-pharmacologic comfort measures Outcome: Progressing   Problem: Clinical Measurements: Goal: Respiratory complications will improve Outcome: Progressing Goal: Cardiovascular complication will be avoided Outcome: Progressing   Problem: Nutrition: Goal: Adequate nutrition will be maintained Outcome: Progressing   Problem: Coping: Goal: Level of anxiety will decrease Outcome: Progressing   Problem: Pain Managment: Goal: General experience of comfort will improve and/or be controlled Outcome: Progressing   Problem: Skin Integrity: Goal: Risk for impaired skin integrity will decrease Outcome: Progressing

## 2024-04-12 NOTE — Progress Notes (Addendum)
 MEWS Progress Note  Patient Details Name: Jeff Romero MRN: [id number] DOB: August 26, 1961 Today's Date: 04/12/2024   MEWS Flowsheet Documentation:  Assess: MEWS Score Temp: 98.2 F (36.8 C) BP: 120/65 MAP (mmHg): 80 Pulse Rate: (!) 117 ECG Heart Rate: (!) 126 Resp: 16 Level of Consciousness: Alert SpO2: 90 % O2 Device: Room Air Assess: MEWS Score MEWS Temp: 0 MEWS Systolic: 0 MEWS Pulse: 2 MEWS RR: 0 MEWS LOC: 0 MEWS Score: 2 MEWS Score Color: Yellow Assess: SIRS CRITERIA SIRS Temperature : 0 SIRS Respirations : 0 SIRS Pulse: 1 SIRS WBC: 0 SIRS Score Sum : 1         [name]  [name] 04/12/2024, 7:36 AM

## 2024-04-12 NOTE — Anesthesia Preprocedure Evaluation (Signed)
 Anesthesia Evaluation  Patient identified by MRN, date of birth, ID band Patient awake    Reviewed: Allergy & Precautions, NPO status , Patient's Chart, lab work & pertinent test results  Airway Mallampati: II  TM Distance: >3 FB Neck ROM: Full    Dental no notable dental hx.    Pulmonary former smoker, PE Lung Cancer   Pulmonary exam normal        Cardiovascular negative cardio ROS  Rhythm:Regular Rate:Tachycardia     Neuro/Psych  PSYCHIATRIC DISORDERS Anxiety Depression    negative neurological ROS     GI/Hepatic ,GERD  Medicated and Controlled,,(+) Hepatitis -  Endo/Other  negative endocrine ROS    Renal/GU negative Renal ROS     Musculoskeletal  (+) Arthritis ,    Abdominal   Peds  Hematology  (+) Blood dyscrasia, anemia   Anesthesia Other Findings Odynophgia  Reproductive/Obstetrics                              Anesthesia Physical Anesthesia Plan  ASA: 3  Anesthesia Plan: MAC   Post-op Pain Management:    Induction:   PONV Risk Score and Plan: 1 and Propofol  infusion and Treatment may vary due to age or medical condition  Airway Management Planned: Nasal Cannula  Additional Equipment:   Intra-op Plan:   Post-operative Plan:   Informed Consent: I have reviewed the patients History and Physical, chart, labs and discussed the procedure including the risks, benefits and alternatives for the proposed anesthesia with the patient or authorized representative who has indicated his/her understanding and acceptance.     Dental advisory given  Plan Discussed with: CRNA  Anesthesia Plan Comments:         Anesthesia Quick Evaluation

## 2024-04-12 NOTE — Transfer of Care (Signed)
 Immediate Anesthesia Transfer of Care Note  Patient: Jeff Romero  Procedure(s) Performed: EGD (ESOPHAGOGASTRODUODENOSCOPY)  Patient Location: PACU  Anesthesia Type:MAC  Level of Consciousness: drowsy  Airway & Oxygen Therapy: Patient Spontanous Breathing and Patient connected to face mask oxygen  Post-op Assessment: Report given to RN and Post -op Vital signs reviewed and stable  Post vital signs: Reviewed and stable  Last Vitals:  Vitals Value Taken Time  BP 123/96 04/12/24 15:17  Temp 36.7 C 04/12/24 15:17  Pulse 110 04/12/24 15:19  Resp 18 04/12/24 15:19  SpO2 100 % 04/12/24 15:19  Vitals shown include unfiled device data.  Last Pain:  Vitals:   04/12/24 1517  TempSrc: Temporal  PainSc:       Patients Stated Pain Goal: 0 (04/12/24 0811)  Complications: No notable events documented.

## 2024-04-12 NOTE — Progress Notes (Signed)
 PROGRESS NOTE    Jeff Romero  FMW:[id number] DOB: 02-07-1961 DOA: 04/09/2024 PCP: [name]  Brief Narrative: 63 yrs old unfortunate male with medical history significant for stage IV non-small cell lung cancer,  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  He has been under the care of [name], currently on Keytruda  and completed radiation. He has also been followed by palliative care for symptom management, recently his pain medication regimen was adjusted he has been placed on OxyContin  10 mg p.o. every 8 hours, as well as oxycodone  15 mg p.o. every 4 hours for breakthrough pain.  Patient continued to complain about severe throat pain which makes swallowing difficult, also has difficulty taking a lot of his p.o. medications, also reports difficulty tolerating drinking water and even nutritional supplements like Ensure.  He was hospitalized in June and was found at that time to have a right external jugular vein thrombosis in the setting of Eliquis .  He was transitioned to therapeutic Lovenox  which he is continuing.  Patient was admitted for further management.  Assessment & Plan:   Principal Problem:   Cancer-related breakthrough pain  Metastatic Stage IV non-small cell lung cancer: Unfortunately he has severe esophagitis with dysphagia status post radiation.   Now being admitted to the hospital with evidence of dehydration, severe pain, weight loss in the setting of inability to tolerate orally. Continue IV fluid resuscitation.  He is currently on Keytruda . Continue OxyContin , oxycodone , IV Dilaudid  for breakthrough pain Palliative care for symptom management, goals of care discussion; given his continued failure to thrive and weight loss, he may be best served in this unfortunate situation with inpatient hospice.    Hypokalemia- replete  Right external jugular vein thrombosis : therapeutic Lovenox  on hold for egd   Fever: Patient has spiked fever but there is no other signs of infection. Hold on antibiotics.  Follow-up cultures.   Chronic pain syndrome: Continue OxyContin  oxycodone  and IV Dilaudid  for breakthrough pain.   Chronic normocytic normochromic anemia.: Prior baseline hemoglobin was 7.0,currently 7.4 Continue to monitor H&H. There is no any obvious visible bleeding.   Throat pain: Continue HurriCaine spray thrice daily as needed, continue Magic mouthwash. Continue pain management. Barium swallow study 7/8-No significant esophageal dysmotility observed. Mild distal esophageal fold thickening may reflect mild esophagitis. No appreciable ulceration or stricture. No frank tracheal aspiration. GI recommended ENT consult.  Pressure Injury 03/15/24 Buttocks Left Stage 2 -  Partial thickness loss of dermis presenting as a shallow open injury with a red, pink wound bed without slough. Dry, Pink (Active)  03/15/24 2100  Location: Buttocks  Location Orientation: Left  Staging: Stage 2 -  Partial thickness loss of dermis presenting as a shallow open injury with a red, pink wound bed without slough.  Wound Description (Comments): Dry, Pink  DO NOT USE:  Present on Admission: Yes    Nutrition Problem: Severe Malnutrition Etiology: chronic illness, cancer and cancer related treatments   Signs/Symptoms: severe fat depletion, severe muscle depletion, energy intake < or equal to 50% for > or equal to 1 month, percent weight loss  Interventions: Boost Breeze, Prostat, Education  Estimated body mass index is 18.44 kg/m as calculated from the following:   Height as of this encounter: 5' 7 (1.702 m).   Weight as of this encounter: 53.4 kg.  DVT prophylaxis: lovenox  Code Status: full  Family Communication: wife Disposition Plan:  Status is: Inpatient Remains inpatient appropriate because:  acute illness   Consultants: gi  Procedures: none Antimicrobials: none  Subjective: Pain better on dilaudid   Objective: Vitals:   04/11/24 2110 04/12/24 0524 04/12/24 0735 04/12/24 0942  BP: 124/67 (!) 114/54 120/65 109/62  Pulse: (!) 120 (!) 122 (!) 117 (!) 115  Resp: 18 20 16 16   Temp: 99.1 F (37.3 C) 99 F (37.2 C) 98.2 F (36.8 C) 98.6 F (37 C)  TempSrc: Oral Oral Oral Oral  SpO2: 96% (!) 86% 90% 90%  Weight:      Height:        Intake/Output Summary (Last 24 hours) at 04/12/2024 1206 Last data filed at 04/12/2024 0603 Gross per 24 hour  Intake --  Output 325 ml  Net -325 ml   Filed Weights   04/09/24 1603  Weight: 53.4 kg    Examination:  General exam: Appears frail ill appearing Respiratory system: Clear to auscultation. Respiratory effort normal. Cardiovascular system: S1 & S2 heard, RRR. No JVD, murmurs, rubs, gallops or clicks. No pedal edema. Gastrointestinal system: Abdomen is nondistended, soft and nontender. No organomegaly or masses felt. Normal bowel sounds heard. Central nervous system: Alert and oriented. No focal neurological deficits. Extremities: no edema  Data Reviewed: I have personally reviewed following labs and imaging studies  CBC: Recent Labs  Lab 04/09/24 1237 04/10/24 0508 04/12/24 0624  WBC 8.7 7.7 7.9  NEUTROABS 7.4  --   --   HGB 9.4* 7.6* 7.4*  HCT 30.6* 24.8* 24.7*  MCV 81.0 79.5* 82.3  PLT 251 245 241   Basic Metabolic Panel: Recent Labs  Lab 04/09/24 1237 04/10/24 0508 04/12/24 0624  NA 139 139 136  K 3.5 3.5 3.4*  CL 103 107 103  CO2 23 24 25   GLUCOSE 94 113* 108*  BUN 17 15 13   CREATININE 0.70 0.67 0.65  CALCIUM  8.3* 7.9* 8.0*   GFR: Estimated Creatinine Clearance: 71.4 mL/min (by C-G formula based on SCr of 0.65 mg/dL). Liver Function Tests: Recent Labs  Lab 04/12/24 0624  AST 249*  ALT 161*  ALKPHOS 221*  BILITOT 0.8  PROT 5.5*  ALBUMIN <1.5*   No results for input(s): LIPASE, AMYLASE  in the last 168 hours. No results for input(s): AMMONIA in the last 168 hours. Coagulation Profile: No results for input(s): INR, PROTIME in the last 168 hours. Cardiac Enzymes: No results for input(s): CKTOTAL, CKMB, CKMBINDEX, TROPONINI in the last 168 hours. BNP (last 3 results) No results for input(s): PROBNP in the last 8760 hours. HbA1C: No results for input(s): HGBA1C in the last 72 hours. CBG: No results for input(s): GLUCAP in the last 168 hours. Lipid Profile: No results for input(s): CHOL, HDL, LDLCALC, TRIG, CHOLHDL, LDLDIRECT in the last 72 hours. Thyroid  Function Tests: No results for input(s): TSH, T4TOTAL, FREET4, T3FREE, THYROIDAB in the last 72 hours. Anemia Panel: Recent Labs    04/12/24 0624  VITAMINB12 932*  FOLATE 6.1  FERRITIN 5,127*  TIBC 139*  IRON 25*   Sepsis Labs: No results for input(s): PROCALCITON, LATICACIDVEN in the last 168 hours.  No results found for this or any previous visit (from the past 240 hours).       Radiology Studies: DG ESOPHAGUS W SINGLE CM (SOL OR THIN BA) Result Date: 04/11/2024 CLINICAL DATA:  Dysphagia, coughing, painful swallowing. EXAM: ESOPHOGRAM/BARIUM SWALLOW TECHNIQUE: Single contrast examination was performed using  thin barium. FLUOROSCOPY: Radiation Exposure Index (as provided by  the fluoroscopic device): 7.8 mGy Kerma COMPARISON:  CT chest 03/15/2024 FINDINGS: Due to the patient's clinical condition, he was only able to assume the LPO position, all sequences are obtained with the patient LPO. The pharyngeal phase of swallowing is only seen in the oblique projection in passing. There were no episodes of tracheal aspiration. Primary peristaltic waves were intact on 4/4 swallows with only a small amount of proximal escape. Mild distal esophageal fold thickening may reflect mild esophagitis. No appreciable ulceration or overt stricture identified. IMPRESSION: 1. No significant  esophageal dysmotility observed. 2. Mild distal esophageal fold thickening may reflect mild esophagitis. No appreciable ulceration or stricture. 3. No frank tracheal aspiration. 4. Entire exam performed with the patient in the LPO position due to the patient's condition. Electronically Signed   By: Ryan Salvage M.D.   On: 04/11/2024 15:27    Scheduled Meds:  (feeding supplement) PROSource Plus  30 mL Oral TID BM   benzonatate   200 mg Oral TID   enoxaparin  (LOVENOX ) injection  50 mg Subcutaneous Q12H   feeding supplement  1 Container Oral TID BM   feeding supplement (KATE [employer]ARMS STANDARD 1.4)  325 mL Oral BID BM   gabapentin   100 mg Oral BID   HYDROmorphone   4 mg Oral TID AC   megestrol   40 mg Oral TID WC & HS   oxyCODONE   10 mg Oral Q8H   pantoprazole   40 mg Oral Daily   polyethylene glycol  17 g Oral Daily   senna-docusate  2 tablet Oral QHS   sucralfate   1 g Oral TID WC & HS   Continuous Infusions:  sodium chloride  100 mL/hr at 04/12/24 0851     LOS: 2 days    Time spent: 50 min Almarie KANDICE Hoots, MD  04/12/2024, 12:06 PM

## 2024-04-12 NOTE — Op Note (Signed)
 [facility] Patient Name: Jeff Romero Procedure Date: 04/12/2024 MRN: [id number] Attending MD: Aloha Finner , MD, [id number] Date of Birth: 05-24-1961 CSN: [id number] Age: 63 Admit Type: Inpatient Procedure:                Upper GI endoscopy Indications:              Odynophagia, Sore throat, Weight loss Providers:                Aloha Finner, MD, Hoy Penner, RN,                            Curtistine Bishop, Technician Referring MD:              Medicines:                Monitored Anesthesia Care Complications:            No immediate complications. Estimated Blood Loss:     Estimated blood loss was minimal. Procedure:                Pre-Anesthesia Assessment:                           - Prior to the procedure, a History and Physical                            was performed, and patient medications and                            allergies were reviewed. The patient's tolerance of                            previous anesthesia was also reviewed. The risks                            and benefits of the procedure and the sedation                            options and risks were discussed with the patient.                            All questions were answered, and informed consent                            was obtained. Prior Anticoagulants: The patient has                            taken Lovenox  (enoxaparin ), last dose was 1 day                            prior to procedure. ASA Grade Assessment: III - A                            patient with severe systemic disease. After  reviewing the risks and benefits, the patient was                            deemed in satisfactory condition to undergo the                            procedure.                           After obtaining informed consent, the endoscope was                            passed under direct vision. Throughout the                            procedure, the  patient's blood pressure, pulse, and                            oxygen saturations were monitored continuously. The                            [id number]-H190 ([id number]) Olympus endoscope was introduced                            through the mouth, and advanced to the second part                            of duodenum. The upper GI endoscopy was                            accomplished without difficulty. The patient                            tolerated the procedure. Scope In: Scope Out: Findings:      Diffuse moderate mucosal changes characterized by congestion,       granularity, sloughing and altered texture were found at the       cricopharyngeus and in the proximal esophagus. More suggestive of       radiation changes. Biopsies were taken with a cold forceps for histology       and rule out Candida and Viral esophagitis.      Patchy mild mucosal changes characterized by altered texture were found       in the mid esophagus. More suggestive of radiation changes. Biopsies       were taken with a cold forceps for histology.      The distal esophagus was normal.      The Z-line was irregular and was found 40 cm from the incisors.      A 1 cm hiatal hernia was present.      Patchy mildly erythematous mucosa without bleeding was found in the       entire examined stomach. Biopsies were taken with a cold forceps for       histology and Helicobacter pylori testing.      No gross lesions were noted in the duodenal bulb, in the first portion       of the duodenum  and in the second portion of the duodenum. Impression:               - Congested, granular, texture changed mucosa in                            the proximal esophagus. Biopsied. More suggestive                            of radiation changes and mucositis rather than                            viral.                           - Texture changed mucosa in the esophagus in middle                            esophagus. Biopsied.                            - Normal distal esophagus.                           - Z-line irregular, 40 cm from the incisors.                           - 1 cm hiatal hernia.                           - Erythematous mucosa in the stomach. Biopsied.                           - No gross lesions in the duodenal bulb, in the                            first portion of the duodenum and in the second                            portion of the duodenum. Moderate Sedation:      Not Applicable - Patient had care per Anesthesia. Recommendation:           - The patient will be observed post-procedure,                            until all discharge criteria are met.                           - Return patient to hospital ward for ongoing care.                           - Observe patient's clinical course.                           - Await pathology results. If evidence of viral  process then will need treatment or if evidence of                            Candida is found will need treatment (does not look                            like either overtly at this time).                           - Continue Protonix  and if able to tolerate                            swallowing then increasing up to twice daily.                           - Continue Carafate  4 times daily.                           - May continue Magic Mouthwash.                           - May restart Lovenox  @ 900 PM.                           - No further inpatient GI workup at this time. We                            will sign off, but call with questions.                           - The findings and recommendations were discussed                            with the patient.                           - The findings and recommendations were discussed                            with the patient's family.                           - The findings and recommendations were discussed                            with the referring  physician. Procedure Code(s):        --- Professional ---                           [phone], Esophagogastroduodenoscopy, flexible,                            transoral; with biopsy, single or multiple Diagnosis Code(s):        --- Professional ---  K22.89, Other specified disease of esophagus                           K31.89, Other diseases of stomach and duodenum                           K44.9, Diaphragmatic hernia without obstruction or                            gangrene                           R13.10, Dysphagia, unspecified                           J02.9, Acute pharyngitis, unspecified                           R63.4, Abnormal weight loss CPT copyright 2022 [employer]. All rights reserved. The codes documented in this report are preliminary and upon coder review may  be revised to meet current compliance requirements. Aloha Finner, MD 04/12/2024 3:34:23 PM Number of Addenda: 0

## 2024-04-12 NOTE — Anesthesia Postprocedure Evaluation (Signed)
 Anesthesia Post Note  Patient: Jeff Romero  Procedure(s) Performed: EGD (ESOPHAGOGASTRODUODENOSCOPY)     Patient location during evaluation: Endoscopy Anesthesia Type: MAC Level of consciousness: awake Pain management: pain level controlled Vital Signs Assessment: post-procedure vital signs reviewed and stable Respiratory status: spontaneous breathing, nonlabored ventilation and respiratory function stable Cardiovascular status: blood pressure returned to baseline and stable Postop Assessment: no apparent nausea or vomiting Anesthetic complications: no   No notable events documented.  Last Vitals:  Vitals:   04/12/24 1545 04/12/24 1559  BP:  120/68  Pulse: (!) 115 (!) 113  Resp: 13 18  Temp:  37.1 C  SpO2: 95% 97%    Last Pain:  Vitals:   04/12/24 1611  TempSrc:   PainSc: 8                  Elesha Thedford P Emory Gallentine

## 2024-04-13 ENCOUNTER — Other Ambulatory Visit

## 2024-04-13 DIAGNOSIS — Z515 Encounter for palliative care: Secondary | ICD-10-CM | POA: Diagnosis not present

## 2024-04-13 DIAGNOSIS — R638 Other symptoms and signs concerning food and fluid intake: Secondary | ICD-10-CM

## 2024-04-13 DIAGNOSIS — C3490 Malignant neoplasm of unspecified part of unspecified bronchus or lung: Secondary | ICD-10-CM

## 2024-04-13 DIAGNOSIS — G893 Neoplasm related pain (acute) (chronic): Secondary | ICD-10-CM | POA: Diagnosis not present

## 2024-04-13 LAB — CBC
HCT: 23.3 % — ABNORMAL LOW (ref 39.0–52.0)
Hemoglobin: 7.1 g/dL — ABNORMAL LOW (ref 13.0–17.0)
MCH: 24.8 pg — ABNORMAL LOW (ref 26.0–34.0)
MCHC: 30.5 g/dL (ref 30.0–36.0)
MCV: 81.5 fL (ref 80.0–100.0)
Platelets: 235 K/uL (ref 150–400)
RBC: 2.86 MIL/uL — ABNORMAL LOW (ref 4.22–5.81)
RDW: 22.3 % — ABNORMAL HIGH (ref 11.5–15.5)
WBC: 7.6 K/uL (ref 4.0–10.5)
nRBC: 0 % (ref 0.0–0.2)

## 2024-04-13 LAB — COMPREHENSIVE METABOLIC PANEL WITH GFR
ALT: 108 U/L — ABNORMAL HIGH (ref 0–44)
AST: 117 U/L — ABNORMAL HIGH (ref 15–41)
Albumin: <1.5 g/dL — ABNORMAL LOW (ref 3.5–5.0)
Alkaline Phosphatase: 206 U/L — ABNORMAL HIGH (ref 38–126)
Anion gap: 10 (ref 5–15)
BUN: 14 mg/dL (ref 8–23)
CO2: 23 mmol/L (ref 22–32)
Calcium: 7.7 mg/dL — ABNORMAL LOW (ref 8.9–10.3)
Chloride: 100 mmol/L (ref 98–111)
Creatinine, Ser: 0.62 mg/dL (ref 0.61–1.24)
GFR, Estimated: >60 mL/min (ref >60)
Glucose, Bld: 153 mg/dL — ABNORMAL HIGH (ref 70–99)
Potassium: 3 mmol/L — ABNORMAL LOW (ref 3.5–5.1)
Sodium: 133 mmol/L — ABNORMAL LOW (ref 135–145)
Total Bilirubin: 1 mg/dL (ref 0.0–1.2)
Total Protein: 5.2 g/dL — ABNORMAL LOW (ref 6.5–8.1)

## 2024-04-13 LAB — MAGNESIUM: Magnesium: 1.8 mg/dL (ref 1.7–2.4)

## 2024-04-13 MED ORDER — GABAPENTIN 100 MG PO CAPS
100.0000 mg | ORAL_CAPSULE | Freq: Three times a day (TID) | ORAL | Status: DC
  Administered 2024-04-13 – 2024-04-15 (×6): 100 mg via ORAL
  Filled 2024-04-13 (×6): qty 1

## 2024-04-13 MED ORDER — POTASSIUM CHLORIDE 10 MEQ/100ML IV SOLN
10.0000 meq | INTRAVENOUS | Status: AC
  Administered 2024-04-13 (×4): 10 meq via INTRAVENOUS
  Filled 2024-04-13: qty 100

## 2024-04-13 MED ORDER — PANTOPRAZOLE SODIUM 40 MG PO TBEC
40.0000 mg | DELAYED_RELEASE_TABLET | Freq: Two times a day (BID) | ORAL | Status: DC
  Administered 2024-04-13 – 2024-04-15 (×4): 40 mg via ORAL
  Filled 2024-04-13 (×4): qty 1

## 2024-04-13 NOTE — Plan of Care (Signed)

## 2024-04-13 NOTE — Progress Notes (Signed)
   04/13/24 0853  TOC Brief Assessment  Insurance and Status Reviewed  Patient has primary care physician Yes  Home environment has been reviewed home w/ family  Prior level of function: independent  Prior/Current Home Services No current home services  Social Drivers of Health Review SDOH reviewed no interventions necessary  Readmission risk has been reviewed Yes  Transition of care needs no transition of care needs at this time

## 2024-04-13 NOTE — Progress Notes (Signed)
 Daily Progress Note   Patient Name: Jeff Romero       Date: 04/13/2024 DOB: 1961/02/19  Age: 63 y.o. MRN#: [id number] Attending Physician: Will Almarie MATSU, MD Primary Care Physician: [facility] Admit Date: 04/09/2024  Reason for Consultation/Follow-up: Establishing goals of care  Subjective: Pain better controlled but cough bothering him significantly.   Length of Stay: 3  Current Medications: Scheduled Meds:   (feeding supplement) PROSource Plus  30 mL Oral TID BM   benzonatate   200 mg Oral TID   enoxaparin  (LOVENOX ) injection  50 mg Subcutaneous Q12H   feeding supplement  1 Container Oral TID BM   feeding supplement (KATE FARMS STANDARD 1.4)  325 mL Oral BID BM   gabapentin   100 mg Oral TID   HYDROmorphone   4 mg Oral TID AC   megestrol   40 mg Oral TID WC & HS   oxyCODONE   10 mg Oral Q8H   pantoprazole   40 mg Oral BID   polyethylene glycol  17 g Oral Daily   senna-docusate  2 tablet Oral QHS   sucralfate   1 g Oral TID WC & HS    Continuous Infusions:  sodium chloride  Stopped (04/12/24 0854)   potassium chloride       PRN Meds: acetaminophen  **OR** acetaminophen , albuterol , antiseptic oral rinse, Benzocaine , HYDROcodone  bit-homatropine, HYDROmorphone  (DILAUDID ) injection, HYDROmorphone , magic mouthwash, ondansetron  **OR** ondansetron  (ZOFRAN ) IV, mouth rinse, traZODone   Physical Exam Constitutional:      General: He is not in acute distress.    Appearance: He is ill-appearing.     Comments: Consistent dry cough throughout visit  Pulmonary:     Effort: Pulmonary effort is normal.  Skin:    General: Skin is warm and dry.  Neurological:     Mental Status: He is alert and oriented to person, place, and time.             Vital Signs: BP 125/68 (BP Location: Left Arm)    Pulse (!) 116   Temp 98.2 F (36.8 C) (Oral)   Resp 16   Ht 5' 7 (1.702 m)   Wt 53.4 kg   SpO2 94%   BMI 18.44 kg/m  SpO2: SpO2: 94 % O2 Device: O2 Device: Room Air O2 Flow Rate: O2 Flow Rate (L/min): 2 L/min  Intake/output summary:  Intake/Output Summary (Last 24 hours) at 04/13/2024 1443 Last data filed at 04/13/2024 1050 Gross per 24 hour  Intake 456.85 ml  Output 300 ml  Net 156.85 ml   LBM: Last BM Date : 04/09/24 Baseline Weight: Weight: 53.4 kg Most recent weight: Weight: 53.4 kg       Palliative Assessment/Data:PPS 40%      Patient Active Problem List   Diagnosis Date Noted   Encounter for antineoplastic immunotherapy 04/03/2024   Protein-calorie malnutrition, severe 03/17/2024   Non-small cell cancer of right lung (HCC) 03/16/2024   Cancer-related breakthrough pain 03/16/2024   Acute external jugular vein thrombosis 03/15/2024   Thrombosis 03/15/2024   Acute pulmonary embolism (HCC) 02/13/2024   Bilateral pulmonary embolism (HCC) 02/12/2024   Anemia 02/12/2024   Hypokalemia 02/12/2024   Right lower lobe pulmonary infiltrate 01/21/2024   Malignant neoplasm of upper lobe of right  lung (HCC) 01/20/2024   Epigastric pain    Hepatitis    Hyperbilirubinemia    Malnutrition of moderate degree 07/14/2016   Transaminitis 07/13/2016   Abdominal pain 07/13/2016   Nausea & vomiting 07/13/2016   Alcohol abuse 07/13/2016    Palliative Care Assessment & Plan   HPI: This 63 yrs old unfortunate male with medical history significant for stage IV non-small cell lung cancer,  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 Palliative care has been asked to assist with additional goals of care conversations.   Assessment: Today Mr Arscott tells me pain is somewhat better with the addition of scheduled dilaudid   prior to meals. He is tolerating medication well - no significant drowsiness. He does share concern that he did not receive his dilaudid  prior to breakfast this AM. I verified the dilaudid  is scheduled for 8 AM as this is when he would like to receive it. We also reviewed he continues to receive his oxycontin . We discuss he has PRN dilaudid  available if scheduled medication is not controlling his pain - he was not aware of this.   His cough is bothering him significantly. He tells me no cough medicine helps. We discussed increasing his gabapentin . He is agreeable to this.  Call to wife [name] - she is working today. Reviewed the above including his pain regimen and plan to increase gabapentin  frequency. She agrees.   Recommendations/Plan: Continue current pain regimen Increase gabapentin  to TID in hopes for cough relief Continue prn meds for cough  Full code/full scope  Code Status: Full code  Care plan was discussed with patient and wife  Thank you for allowing the Palliative Medicine Team to assist in the care of this patient.   Total Time 40 minutes Prolonged Time Billed  no   Time spent includes: Detailed review of medical records (labs, imaging, vital signs), medically appropriate exam, discussion with treatment team, counseling and educating patient, family and/or staff, documenting clinical information, medication management and coordination of care.     *Please note that this is a verbal dictation therefore any spelling or grammatical errors are due to the Dragon Medical One system interpretation.  Tobey Jama Barnacle, DNP, [facility] Palliative Medicine Team Team Phone # [phone]  Pager [phone]

## 2024-04-13 NOTE — Progress Notes (Addendum)
 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  He has been under the care of [name], currently on Keytruda  and completed radiation. He has also been followed by palliative care for symptom management, recently his pain medication regimen was adjusted he has been placed on OxyContin  10 mg p.o. every 8 hours, as well as oxycodone  15 mg p.o. every 4 hours for breakthrough pain.  Patient continued to complain about severe throat pain which makes swallowing difficult, also has difficulty taking a lot of his p.o. medications, also reports difficulty tolerating drinking water and even nutritional supplements like Ensure.  He was hospitalized in June and was found at that time to have a right external jugular vein thrombosis in the setting of Eliquis .  He was transitioned to therapeutic Lovenox  which he is continuing.  Patient was admitted for further management.  Assessment & Plan:   Principal Problem:   Cancer-related breakthrough pain  Metastatic Stage IV non-small cell lung cancer: Unfortunately he has severe esophagitis with dysphagia status post radiation.   Now being admitted to the hospital with evidence of dehydration, severe pain, weight loss in the setting of inability to tolerate orally. Continue IV fluid resuscitation.  He is currently on Keytruda . Continue OxyContin , oxycodone , IV Dilaudid  for breakthrough pain Palliative care for symptom management, goals of care discussion; given his continued failure to thrive and weight loss, he may be best served in this unfortunate situation with inpatient hospice.    Hypokalemia- replete K 3.0  Right external jugular vein thrombosis : therapeutic Lovenox  on hold for egd   Fever: Patient has spiked fever but there is no other signs of infection. Hold on antibiotics.  Follow-up cultures.   Chronic pain syndrome: Continue OxyContin  oxycodone  and IV Dilaudid  for breakthrough pain.   Chronic normocytic normochromic anemia.: Prior baseline hemoglobin was 7.0,currently 7.4 Continue to monitor H&H. There is no any obvious visible bleeding.   Throat pain: Continue HurriCaine spray thrice daily as needed, continue Magic mouthwash. Continue pain management. Barium swallow study 7/8-No significant esophageal dysmotility observed. Mild distal esophageal fold thickening may reflect mild esophagitis. No appreciable ulceration or stricture. No frank tracheal aspiration. GI recommended ENT consult. EGD-- Congested, granular, texture changed mucosa in                            the proximal esophagus. Biopsied. More suggestive                            of radiation changes and mucositis rather than                            viral.                           - Texture changed mucosa in the esophagus in middle                            esophagus. Biopsied.                           -  Normal distal esophagus.                           - Z-line irregular, 40 cm from the incisors.                           - 1 cm hiatal hernia.                           - Erythematous mucosa in the stomach. Biopsied.                           - No gross lesions in the duodenal bulb, in the                            first portion of the duodenum and in the second                            portion of the duodenum.  Continue Protonix  and if able to tolerate                            swallowing then increasing up to twice daily.                           - Continue Carafate  4 times daily.                           - May continue Magic Mouthwash.                           -  May restart Lovenox  @ 900 PM.                           - No further inpatient GI workup at this time.                      Nutrition Problem: Severe Malnutrition Etiology: chronic illness, cancer and cancer related treatments   Signs/Symptoms: severe fat depletion, severe muscle depletion, energy intake < or equal to 50% for > or equal to 1 month, percent weight loss  Interventions: Boost Breeze, Prostat, Education  Estimated body mass index is 18.44 kg/m as calculated from the following:   Height as of this encounter: 5' 7 (1.702 m).   Weight as of this encounter: 53.4 kg.  DVT prophylaxis: lovenox  Code Status: full Family Communication: wife Disposition Plan:  Status is: Inpatient Remains inpatient appropriate because: acute illness   Consultants: gi  Procedures: none Antimicrobials: none  Subjective: Pain better on dilaudid   Objective: Vitals:   04/12/24 1559 04/12/24 1734 04/12/24 1751 04/13/24 0512  BP: 120/68 114/63  102/62  Pulse: (!) 113 (!) 117 (!) 110   Resp: 18 16  16   Temp: 98.7 F (37.1 C) 98.3 F (36.8 C)  98.3 F (36.8 C)  TempSrc: Oral Oral  Oral  SpO2: 97% 97%  91%  Weight:      Height:        Intake/Output Summary (Last 24 hours) at 04/13/2024 1323 Last data filed  at 04/13/2024 1050 Gross per 24 hour  Intake 456.85 ml  Output 300 ml  Net 156.85 ml   Filed Weights   04/09/24 1603  Weight: 53.4 kg    Examination:  General exam: Appears frail ill appearing Respiratory system: Clear to auscultation. Respiratory effort normal. Cardiovascular system: S1 & S2 heard, RRR. No JVD, murmurs, rubs, gallops or clicks. No pedal edema. Gastrointestinal system: Abdomen is nondistended, soft and nontender. No organomegaly or masses felt. Normal bowel sounds heard. Central nervous system: Alert and oriented. No focal neurological deficits. Extremities: no edema  Data Reviewed: I have personally reviewed following labs and imaging  studies  CBC: Recent Labs  Lab 04/09/24 1237 04/10/24 0508 04/12/24 0624 04/13/24 0606  WBC 8.7 7.7 7.9 7.6  NEUTROABS 7.4  --   --   --   HGB 9.4* 7.6* 7.4* 7.1*  HCT 30.6* 24.8* 24.7* 23.3*  MCV 81.0 79.5* 82.3 81.5  PLT 251 245 241 235   Basic Metabolic Panel: Recent Labs  Lab 04/09/24 1237 04/10/24 0508 04/12/24 0624 04/13/24 0606  NA 139 139 136 133*  K 3.5 3.5 3.4* 3.0*  CL 103 107 103 100  CO2 23 24 25 23   GLUCOSE 94 113* 108* 153*  BUN 17 15 13 14   CREATININE 0.70 0.67 0.65 0.62  CALCIUM  8.3* 7.9* 8.0* 7.7*   GFR: Estimated Creatinine Clearance: 71.4 mL/min (by C-G formula based on SCr of 0.62 mg/dL). Liver Function Tests: Recent Labs  Lab 04/12/24 0624 04/13/24 0606  AST 249* 117*  ALT 161* 108*  ALKPHOS 221* 206*  BILITOT 0.8 1.0  PROT 5.5* 5.2*  ALBUMIN <1.5* <1.5*   No results for input(s): LIPASE, AMYLASE in the last 168 hours. No results for input(s): AMMONIA in the last 168 hours. Coagulation Profile: No results for input(s): INR, PROTIME in the last 168 hours. Cardiac Enzymes: No results for input(s): CKTOTAL, CKMB, CKMBINDEX, TROPONINI in the last 168 hours. BNP (last 3 results) No results for input(s): PROBNP in the last 8760 hours. HbA1C: No results for input(s): HGBA1C in the last 72 hours. CBG: No results for input(s): GLUCAP in the last 168 hours. Lipid Profile: No results for input(s): CHOL, HDL, LDLCALC, TRIG, CHOLHDL, LDLDIRECT in the last 72 hours. Thyroid  Function Tests: No results for input(s): TSH, T4TOTAL, FREET4, T3FREE, THYROIDAB in the last 72 hours. Anemia Panel: Recent Labs    04/12/24 0624  VITAMINB12 932*  FOLATE 6.1  FERRITIN 5,127*  TIBC 139*  IRON 25*   Sepsis Labs: No results for input(s): PROCALCITON, LATICACIDVEN in the last 168 hours.  No results found for this or any previous visit (from the past 240 hours).       Radiology Studies: DG  ESOPHAGUS W SINGLE CM (SOL OR THIN BA) Result Date: 04/11/2024 CLINICAL DATA:  Dysphagia, coughing, painful swallowing. EXAM: ESOPHOGRAM/BARIUM SWALLOW TECHNIQUE: Single contrast examination was performed using  thin barium. FLUOROSCOPY: Radiation Exposure Index (as provided by the fluoroscopic device): 7.8 mGy Kerma COMPARISON:  CT chest 03/15/2024 FINDINGS: Due to the patient's clinical condition, he was only able to assume the LPO position, all sequences are obtained with the patient LPO. The pharyngeal phase of swallowing is only seen in the oblique projection in passing. There were no episodes of tracheal aspiration. Primary peristaltic waves were intact on 4/4 swallows with only a small amount of proximal escape. Mild distal esophageal fold thickening may reflect mild esophagitis. No appreciable ulceration or overt stricture identified. IMPRESSION: 1. No significant esophageal dysmotility  observed. 2. Mild distal esophageal fold thickening may reflect mild esophagitis. No appreciable ulceration or stricture. 3. No frank tracheal aspiration. 4. Entire exam performed with the patient in the LPO position due to the patient's condition. Electronically Signed   By: Ryan Salvage M.D.   On: 04/11/2024 15:27    Scheduled Meds:  (feeding supplement) PROSource Plus  30 mL Oral TID BM   benzonatate   200 mg Oral TID   enoxaparin  (LOVENOX ) injection  50 mg Subcutaneous Q12H   feeding supplement  1 Container Oral TID BM   feeding supplement (KATE FARMS STANDARD 1.4)  325 mL Oral BID BM   gabapentin   100 mg Oral TID   HYDROmorphone   4 mg Oral TID AC   megestrol   40 mg Oral TID WC & HS   oxyCODONE   10 mg Oral Q8H   pantoprazole   40 mg Oral Daily   polyethylene glycol  17 g Oral Daily   senna-docusate  2 tablet Oral QHS   sucralfate   1 g Oral TID WC & HS   Continuous Infusions:  sodium chloride  Stopped (04/12/24 0854)     LOS: 3 days    Time spent: 50 min Almarie KANDICE Hoots, MD  04/13/2024,  1:23 PM

## 2024-04-14 ENCOUNTER — Inpatient Hospital Stay (HOSPITAL_COMMUNITY)

## 2024-04-14 DIAGNOSIS — C3490 Malignant neoplasm of unspecified part of unspecified bronchus or lung: Secondary | ICD-10-CM | POA: Diagnosis not present

## 2024-04-14 DIAGNOSIS — R053 Chronic cough: Secondary | ICD-10-CM | POA: Diagnosis not present

## 2024-04-14 DIAGNOSIS — G893 Neoplasm related pain (acute) (chronic): Secondary | ICD-10-CM | POA: Diagnosis not present

## 2024-04-14 DIAGNOSIS — K5903 Drug induced constipation: Secondary | ICD-10-CM

## 2024-04-14 DIAGNOSIS — R1310 Dysphagia, unspecified: Secondary | ICD-10-CM

## 2024-04-14 DIAGNOSIS — K209 Esophagitis, unspecified without bleeding: Secondary | ICD-10-CM

## 2024-04-14 LAB — COMPREHENSIVE METABOLIC PANEL WITH GFR
ALT: 101 U/L — ABNORMAL HIGH (ref 0–44)
AST: 112 U/L — ABNORMAL HIGH (ref 15–41)
Albumin: <1.5 g/dL — ABNORMAL LOW (ref 3.5–5.0)
Alkaline Phosphatase: 265 U/L — ABNORMAL HIGH (ref 38–126)
Anion gap: 7 (ref 5–15)
BUN: 14 mg/dL (ref 8–23)
CO2: 26 mmol/L (ref 22–32)
Calcium: 8 mg/dL — ABNORMAL LOW (ref 8.9–10.3)
Chloride: 105 mmol/L (ref 98–111)
Creatinine, Ser: 0.63 mg/dL (ref 0.61–1.24)
GFR, Estimated: >60 mL/min (ref >60)
Glucose, Bld: 119 mg/dL — ABNORMAL HIGH (ref 70–99)
Potassium: 3.4 mmol/L — ABNORMAL LOW (ref 3.5–5.1)
Sodium: 138 mmol/L (ref 135–145)
Total Bilirubin: 1 mg/dL (ref 0.0–1.2)
Total Protein: 5.4 g/dL — ABNORMAL LOW (ref 6.5–8.1)

## 2024-04-14 LAB — CBC
HCT: 24.1 % — ABNORMAL LOW (ref 39.0–52.0)
Hemoglobin: 7.2 g/dL — ABNORMAL LOW (ref 13.0–17.0)
MCH: 24.8 pg — ABNORMAL LOW (ref 26.0–34.0)
MCHC: 29.9 g/dL — ABNORMAL LOW (ref 30.0–36.0)
MCV: 83.1 fL (ref 80.0–100.0)
Platelets: 289 K/uL (ref 150–400)
RBC: 2.9 MIL/uL — ABNORMAL LOW (ref 4.22–5.81)
RDW: 22.6 % — ABNORMAL HIGH (ref 11.5–15.5)
WBC: 7.2 K/uL (ref 4.0–10.5)
nRBC: 0.3 % — ABNORMAL HIGH (ref 0.0–0.2)

## 2024-04-14 MED ORDER — SENNOSIDES-DOCUSATE SODIUM 8.6-50 MG PO TABS
2.0000 | ORAL_TABLET | Freq: Two times a day (BID) | ORAL | Status: DC
  Administered 2024-04-14 – 2024-04-15 (×3): 2 via ORAL
  Filled 2024-04-14 (×3): qty 2

## 2024-04-14 MED ORDER — DIPHENHYDRAMINE HCL 25 MG PO CAPS
25.0000 mg | ORAL_CAPSULE | Freq: Four times a day (QID) | ORAL | Status: AC | PRN
  Administered 2024-04-14: 25 mg via ORAL
  Filled 2024-04-14: qty 1

## 2024-04-14 MED ORDER — POTASSIUM CHLORIDE 10 MEQ/100ML IV SOLN
10.0000 meq | INTRAVENOUS | Status: AC
  Administered 2024-04-14 (×4): 10 meq via INTRAVENOUS
  Filled 2024-04-14 (×4): qty 100

## 2024-04-14 NOTE — Progress Notes (Signed)
 PROGRESS NOTE    Jeff Romero  FMW:[id number] DOB: 01-17-1961 DOA: 04/09/2024 PCP: [name]  Brief Narrative: 63 yrs old unfortunate male with medical history significant for stage IV non-small cell lung cancer,  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  He has been under the care of [name], currently on Keytruda  and completed radiation. He has also been followed by palliative care for symptom management, recently his pain medication regimen was adjusted he has been placed on OxyContin  10 mg p.o. every 8 hours, as well as oxycodone  15 mg p.o. every 4 hours for breakthrough pain.  Patient continued to complain about severe throat pain which makes swallowing difficult, also has difficulty taking a lot of his p.o. medications, also reports difficulty tolerating drinking water and even nutritional supplements like Ensure.  He was hospitalized in June and was found at that time to have a right external jugular vein thrombosis in the setting of Eliquis .  He was transitioned to therapeutic Lovenox  which he is continuing.  Patient was admitted for further management.  Assessment & Plan:   Principal Problem:   Cancer-related breakthrough pain Active Problems:   Odynophagia   Acute esophagitis  Metastatic Stage IV non-small cell lung cancer: Unfortunately he has severe esophagitis with dysphagia status post radiation.   Now being admitted to the hospital with evidence of dehydration, severe pain, weight loss in the setting of inability to tolerate orally. He is currently on Keytruda . Continue OxyContin , oxycodone , IV Dilaudid  for breakthrough pain   Hypokalemia- replete K 3.4 replete  Right external jugular vein thrombosis : therapeutic Lovenox     Fever: Patient has spiked fever but there is no other signs of  infection. Hold on antibiotics.  Follow-up cultures.   Chronic pain syndrome: Continue OxyContin  oxycodone  and IV Dilaudid  for breakthrough pain.   Chronic normocytic normochromic anemia.: Prior baseline hemoglobin was 7.0,currently 7.4 Continue to monitor H&H. There is no any obvious visible bleeding.   Throat pain: Continue HurriCaine spray thrice daily as needed, continue Magic mouthwash. Continue pain management. Barium swallow study 7/8-No significant esophageal dysmotility observed. Mild distal esophageal fold thickening may reflect mild esophagitis. No appreciable ulceration or stricture. No frank tracheal aspiration. GI recommended ENT consult. EGD-- Congested, granular, texture changed mucosa in                            the proximal esophagus. Biopsied. More suggestive                            of radiation changes and mucositis rather than                            viral.                           - Texture changed mucosa in the esophagus in middle                            esophagus. Biopsied.                           - Normal distal  esophagus.                           - Z-line irregular, 40 cm from the incisors.                           - 1 cm hiatal hernia.                           - Erythematous mucosa in the stomach. Biopsied.                           - No gross lesions in the duodenal bulb, in the                            first portion of the duodenum and in the second                            portion of the duodenum.  Continue Protonix  and if able to tolerate                            swallowing then increasing up to twice daily.                           - Continue Carafate  4 times daily.                           - May continue Magic Mouthwash.                           - May restart Lovenox  @ 900 PM.                           - No further inpatient GI workup at this time.                      Nutrition Problem: Severe Malnutrition Etiology:  chronic illness, cancer and cancer related treatments   Signs/Symptoms: severe fat depletion, severe muscle depletion, energy intake < or equal to 50% for > or equal to 1 month, percent weight loss  Interventions: Boost Breeze, Prostat, Education  Estimated body mass index is 18.44 kg/m as calculated from the following:   Height as of this encounter: 5' 7 (1.702 m).   Weight as of this encounter: 53.4 kg.  DVT prophylaxis: lovenox  Code Status: full Family Communication: wife Disposition Plan:  Status is: Inpatient Remains inpatient appropriate because: acute illness   Consultants: gi  Procedures: none Antimicrobials: none  Subjective: Advancing diet Likely home in am Tolerating po   Objective: Vitals:   04/13/24 1343 04/13/24 2117 04/14/24 0539 04/14/24 1337  BP: 125/68 107/61 115/66 134/64  Pulse: (!) 116 (!) 109 (!) 103 (!) 108  Resp: 16 16 16 16   Temp: 98.2 F (36.8 C) 100.1 F (37.8 C) 98 F (36.7 C) 98.2 F (36.8 C)  TempSrc: Oral Oral Oral Oral  SpO2: 94% 92% 93% 91%  Weight:      Height:        Intake/Output Summary (Last 24  hours) at 04/14/2024 1529 Last data filed at 04/14/2024 1431 Gross per 24 hour  Intake 1807.31 ml  Output 550 ml  Net 1257.31 ml   Filed Weights   04/09/24 1603  Weight: 53.4 kg    Examination:  General exam: Appears frail ill appearing Respiratory system: Clear to auscultation. Respiratory effort normal. Cardiovascular system: S1 & S2 heard, RRR. No JVD, murmurs, rubs, gallops or clicks. No pedal edema. Gastrointestinal system: Abdomen is nondistended, soft and nontender. No organomegaly or masses felt. Normal bowel sounds heard. Central nervous system: Alert and oriented. No focal neurological deficits. Extremities: no edema  Data Reviewed: I have personally reviewed following labs and imaging studies  CBC: Recent Labs  Lab 04/09/24 1237 04/10/24 0508 04/12/24 0624 04/13/24 0606 04/14/24 0529  WBC 8.7 7.7 7.9 7.6  7.2  NEUTROABS 7.4  --   --   --   --   HGB 9.4* 7.6* 7.4* 7.1* 7.2*  HCT 30.6* 24.8* 24.7* 23.3* 24.1*  MCV 81.0 79.5* 82.3 81.5 83.1  PLT 251 245 241 235 289   Basic Metabolic Panel: Recent Labs  Lab 04/09/24 1237 04/10/24 0508 04/12/24 0624 04/13/24 0606 04/14/24 0529  NA 139 139 136 133* 138  K 3.5 3.5 3.4* 3.0* 3.4*  CL 103 107 103 100 105  CO2 23 24 25 23 26   GLUCOSE 94 113* 108* 153* 119*  BUN 17 15 13 14 14   CREATININE 0.70 0.67 0.65 0.62 0.63  CALCIUM  8.3* 7.9* 8.0* 7.7* 8.0*  MG  --   --   --  1.8  --    GFR: Estimated Creatinine Clearance: 71.4 mL/min (by C-G formula based on SCr of 0.63 mg/dL). Liver Function Tests: Recent Labs  Lab 04/12/24 0624 04/13/24 0606 04/14/24 0529  AST 249* 117* 112*  ALT 161* 108* 101*  ALKPHOS 221* 206* 265*  BILITOT 0.8 1.0 1.0  PROT 5.5* 5.2* 5.4*  ALBUMIN <1.5* <1.5* <1.5*   No results for input(s): LIPASE, AMYLASE in the last 168 hours. No results for input(s): AMMONIA in the last 168 hours. Coagulation Profile: No results for input(s): INR, PROTIME in the last 168 hours. Cardiac Enzymes: No results for input(s): CKTOTAL, CKMB, CKMBINDEX, TROPONINI in the last 168 hours. BNP (last 3 results) No results for input(s): PROBNP in the last 8760 hours. HbA1C: No results for input(s): HGBA1C in the last 72 hours. CBG: No results for input(s): GLUCAP in the last 168 hours. Lipid Profile: No results for input(s): CHOL, HDL, LDLCALC, TRIG, CHOLHDL, LDLDIRECT in the last 72 hours. Thyroid  Function Tests: No results for input(s): TSH, T4TOTAL, FREET4, T3FREE, THYROIDAB in the last 72 hours. Anemia Panel: Recent Labs    04/12/24 0624  VITAMINB12 932*  FOLATE 6.1  FERRITIN 5,127*  TIBC 139*  IRON 25*   Sepsis Labs: No results for input(s): PROCALCITON, LATICACIDVEN in the last 168 hours.  No results found for this or any previous visit (from the past 240 hours).        Radiology Studies: DG Chest 1 View Result Date: 04/14/2024 CLINICAL DATA:  Cough.  Lung cancer. EXAM: CHEST  1 VIEW COMPARISON:  04/09/2024 FINDINGS: Progressive consolidation in the right upper lobe and potentially in the superior segment right lower lobe. Stable pleural thickening at the right lung apex. Left infrahilar fullness attributable to adenopathy, unchanged. Blunting of the right costophrenic angle compatible with right pleural effusion. Heart size within normal limits. Peripheral skin fold along the left upper chest is the favored cause of  the edge in this vicinity, similar findings are present on the prior exam and left pneumothorax is not favored. IMPRESSION: 1. Progressive consolidation in the right upper lobe and potentially in the superior segment right lower lobe. Well pneumonia is likely, a component of radiation pneumonitis is not totally excluded. This has substantially worsened over the last 5 days. 2. Right pleural effusion. 3. Left infrahilar adenopathy, unchanged. 4. Peripheral skin fold along the left upper chest is the favored cause of the edge in this vicinity, similar findings are present on the prior exam and left pneumothorax is not favored. Electronically Signed   By: Ryan Salvage M.D.   On: 04/14/2024 13:35    Scheduled Meds:  (feeding supplement) PROSource Plus  30 mL Oral TID BM   benzonatate   200 mg Oral TID   enoxaparin  (LOVENOX ) injection  50 mg Subcutaneous Q12H   feeding supplement  1 Container Oral TID BM   feeding supplement (KATE FARMS STANDARD 1.4)  325 mL Oral BID BM   gabapentin   100 mg Oral TID   HYDROmorphone   4 mg Oral TID AC   megestrol   40 mg Oral TID WC & HS   oxyCODONE   10 mg Oral Q8H   pantoprazole   40 mg Oral BID   polyethylene glycol  17 g Oral Daily   senna-docusate  2 tablet Oral BID   sucralfate   1 g Oral TID WC & HS   Continuous Infusions:  sodium chloride  50 mL/hr at 04/14/24 1431     LOS: 4 days    Time spent: 50  min Almarie KANDICE Hoots, MD  04/14/2024, 3:29 PM

## 2024-04-14 NOTE — Evaluation (Addendum)
 Physical Therapy Evaluation Patient Details Name: Jeff Romero MRN: [id number] DOB: 1961-07-09 Today's Date: 04/14/2024  History of Present Illness  63 yo male presents to therapy following hospital admission on 04/09/2024 due to breakthrough neck and throat pain impacting PO intake. Pt underwent EGD on 7/9 with biopsy. Pt PMH  includes but is not limited to stage IV non-small lung ca metastatic to bone and lymph nodes s/p radiation on Keytruda , PE, R external jugular vein thrombosis, anxiety, depression, anemia, and prostate ca.  Clinical Impression      Pt admitted with above diagnosis. PT returned in pm and pt agreeable to therapy intervention. Pt is mod I with bed mobility and transfer tasks, gait tasks in hallway > 500 feet no AD with CGA and progressing to S with min cues, pt indicated mild SOB and eval conducted on RA. Pt elected to return to bed and all needs in place. Pt currently with functional limitations due to the deficits listed below (see PT Problem List). Pt will benefit from acute skilled PT to increase their independence and safety with mobility to allow discharge.    Patient Saturations on Room Air at Rest = 90-92%   Patient Saturations on [employer] while Ambulating = 89-96%   Patient Saturations on RA while Ambulating = 89-96%       If plan is discharge home, recommend the following: Assistance with cooking/housework;Assist for transportation   Can travel by private vehicle        Equipment Recommendations None recommended by PT  Recommendations for Other Services       Functional Status Assessment Patient has had a recent decline in their functional status and demonstrates the ability to make significant improvements in function in a reasonable and predictable amount of time.     Precautions / Restrictions Precautions Precautions: Fall (monitor O2) Restrictions Weight Bearing Restrictions Per Provider Order: No      Mobility  Bed Mobility Overal bed  mobility: Modified Independent                  Transfers Overall transfer level: Modified independent Equipment used: None                    Ambulation/Gait Ambulation/Gait assistance: Contact guard assist, Supervision Gait Distance (Feet): 500 Feet Assistive device: None Gait Pattern/deviations: Step-through pattern Gait velocity: wfl     General Gait Details: gait assessed no AD with pt demonstrating good reciprocal pattern, LEs passing in stance phase, adequate foot clearance and no overt LOB with pt progressing from CGA to [employer] Mobility     Tilt Bed    Modified Rankin (Stroke Patients Only)       Balance Overall balance assessment: No apparent balance deficits (not formally assessed)                                           Pertinent Vitals/Pain Pain Assessment Pain Assessment: 0-10 Pain Score: 6  Pain Location: throat, neck Pain Descriptors / Indicators: Aching, Discomfort Pain Intervention(s): Limited activity within patient's tolerance, Monitored during session    Home Living Family/patient expects to be discharged to:: Private residence Living Arrangements: Spouse/significant other Available Help at Discharge: Family;Available PRN/intermittently Type of Home: House Home Access: Level entry     Alternate Level Stairs-Number of Steps:  flight Home Layout: Two level;Able to live on main level with bedroom/bathroom Home Equipment: None      Prior Function Prior Level of Function : Independent/Modified Independent             Mobility Comments: IND no AD for all ADLs, self care tasks and IADLs       Extremity/Trunk Assessment        Lower Extremity Assessment Lower Extremity Assessment: Overall WFL for tasks assessed    Cervical / Trunk Assessment Cervical / Trunk Assessment: Normal  Communication   Communication Communication: No apparent difficulties    Cognition  Arousal: Alert Behavior During Therapy: WFL for tasks assessed/performed   PT - Cognitive impairments: No apparent impairments                         Following commands: Intact       Cueing       General Comments      Exercises     Assessment/Plan    PT Assessment Patient needs continued PT services  PT Problem List Decreased activity tolerance;Cardiopulmonary status limiting activity;Pain       PT Treatment Interventions Gait training;Functional mobility training;Therapeutic activities;Therapeutic exercise;Balance training;Patient/family education    PT Goals (Current goals can be found in the Care Plan section)  Acute Rehab PT Goals Patient Stated Goal: to be able to stop coughing eat and go home PT Goal Formulation: With patient Time For Goal Achievement: 04/28/24 Potential to Achieve Goals: Good    Frequency Min 3X/week     Co-evaluation               AM-PAC PT 6 Clicks Mobility  Outcome Measure Help needed turning from your back to your side while in a flat bed without using bedrails?: None Help needed moving from lying on your back to sitting on the side of a flat bed without using bedrails?: None Help needed moving to and from a bed to a chair (including a wheelchair)?: None Help needed standing up from a chair using your arms (e.g., wheelchair or bedside chair)?: None Help needed to walk in hospital room?: A Little Help needed climbing 3-5 steps with a railing? : A Little 6 Click Score: 22    End of Session Equipment Utilized During Treatment: Gait belt Activity Tolerance: Patient tolerated treatment well Patient left: in bed;with call bell/phone within reach Nurse Communication: Mobility status;Other (comment) (o2 findings) PT Visit Diagnosis: Difficulty in walking, not elsewhere classified (R26.2);Pain Pain - part of body:  (neck and throat)    Time: 8378-8295 PT Time Calculation (min) (ACUTE ONLY): 43 min   Charges:   PT  Evaluation $PT Eval Low Complexity: 1 Low PT Treatments $Gait Training: 8-22 mins $Therapeutic Activity: 8-22 mins PT General Charges $$ ACUTE PT VISIT: 1 Visit         [place], PT Acute Rehab   [place] VEAR Drone 04/14/2024, 5:21 PM

## 2024-04-14 NOTE — Progress Notes (Signed)
 Daily Progress Note   Patient Name: Jeff Romero       Date: 04/14/2024 DOB: 12-04-1960  Age: 63 y.o. MRN#: [id number] Attending Physician: Will Almarie MATSU, MD Primary Care Physician: [facility] Admit Date: 04/09/2024  Reason for Consultation/Follow-up: Establishing goals of care  Subjective: Pain and cough better - no BM in 5 days.   Length of Stay: 4  Current Medications: Scheduled Meds:   (feeding supplement) PROSource Plus  30 mL Oral TID BM   benzonatate   200 mg Oral TID   enoxaparin  (LOVENOX ) injection  50 mg Subcutaneous Q12H   feeding supplement  1 Container Oral TID BM   feeding supplement (KATE FARMS STANDARD 1.4)  325 mL Oral BID BM   gabapentin   100 mg Oral TID   HYDROmorphone   4 mg Oral TID AC   megestrol   40 mg Oral TID WC & HS   oxyCODONE   10 mg Oral Q8H   pantoprazole   40 mg Oral BID   polyethylene glycol  17 g Oral Daily   senna-docusate  2 tablet Oral BID   sucralfate   1 g Oral TID WC & HS    Continuous Infusions:  sodium chloride  50 mL/hr at 04/14/24 0509    PRN Meds: acetaminophen  **OR** acetaminophen , albuterol , antiseptic oral rinse, Benzocaine , HYDROcodone  bit-homatropine, HYDROmorphone  (DILAUDID ) injection, HYDROmorphone , magic mouthwash, ondansetron  **OR** ondansetron  (ZOFRAN ) IV, mouth rinse, traZODone   Physical Exam Constitutional:      General: He is not in acute distress.    Appearance: He is ill-appearing.     Comments: Cough notably less Alert pleasant  Pulmonary:     Effort: Pulmonary effort is normal.  Skin:    General: Skin is warm and dry.  Neurological:     Mental Status: He is alert and oriented to person, place, and time.             Vital Signs: BP 115/66 (BP Location: Left Arm)   Pulse (!) 103   Temp 98 F (36.7 C)  (Oral)   Resp 16   Ht 5' 7 (1.702 m)   Wt 53.4 kg   SpO2 93%   BMI 18.44 kg/m  SpO2: SpO2: 93 % O2 Device: O2 Device: Room Air O2 Flow Rate: O2 Flow Rate (L/min): 2 L/min  Intake/output summary:  Intake/Output Summary (Last 24 hours) at 04/14/2024 1003 Last data filed at 04/14/2024 9394 Gross per 24 hour  Intake 1385.78 ml  Output 650 ml  Net 735.78 ml   LBM: Last BM Date : 04/09/24 Baseline Weight: Weight: 53.4 kg Most recent weight: Weight: 53.4 kg       Palliative Assessment/Data:PPS 40%      Patient Active Problem List   Diagnosis Date Noted   Odynophagia 04/14/2024   Acute esophagitis 04/14/2024   Encounter for antineoplastic immunotherapy 04/03/2024   Protein-calorie malnutrition, severe 03/17/2024   Non-small cell cancer of right lung (HCC) 03/16/2024   Cancer-related breakthrough pain 03/16/2024   Acute external jugular vein thrombosis 03/15/2024   Thrombosis 03/15/2024   Acute pulmonary embolism (HCC) 02/13/2024   Bilateral pulmonary embolism (HCC) 02/12/2024   Anemia 02/12/2024   Hypokalemia 02/12/2024   Right lower lobe pulmonary infiltrate 01/21/2024  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   Epigastric pain    Hepatitis    Hyperbilirubinemia    Malnutrition of moderate degree 07/14/2016   Transaminitis 07/13/2016   Abdominal pain 07/13/2016   Nausea & vomiting 07/13/2016   Alcohol abuse 07/13/2016    Palliative Care Assessment & Plan   HPI: This 63 yrs old unfortunate male with medical history significant for stage IV non-small cell lung cancer,  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 Palliative care has been asked to assist with additional goals of care conversations.   Assessment: Today Jeff Romero tells me pain is better - feels current regimen is working  well and would like to continue. He appears to be tolerating regimen well - he is alert. He hasn't had a bowel movement in 5 days - nurse giving miralax  at time of my visit. Encouraged Jeff. Romero to drink it all. He is also on senna - will increase dose. Jeff. Romero tells me he feels like he good go, just needs to get up and move. Has a PT consult to work with him. He requests I call his wife. He feels his cough is much better.  Call to [facility] - she is working today.  We review the above - she spoke with Jeff. Romero earlier and agrees he is feeling much better and symptoms are managed better. She would like to continue current regimen as well.  We review concern on no BM and how this relates to pain meds - discussed increasing senna. She feels once he gets up and moves it will help. We discuss that if this continues we can try enema.   Allowed time to review stress of illness on Jeff Romero and his wife. Support and encouragement provided.   Recommendations/Plan: Continue current pain regimen - symptoms much better Continue gabapentin  TID in hopes for cough relief - improved Continue prn meds for cough  Full code/full scope Increase senna dose - if no BM, consider adding enemna  Code Status: Full code  Care plan was discussed with patient and wife  Thank you for allowing the Palliative Medicine Team to assist in the care of this patient.   Total Time 40 minutes Prolonged Time Billed  no   Time spent includes: Detailed review of medical records (labs, imaging, vital signs), medically appropriate exam, discussion with treatment team, counseling and educating patient, family and/or staff, documenting clinical information, medication management and coordination of care.     *Please note that this is a verbal dictation therefore any spelling or grammatical errors are due to the Dragon [employer] One system interpretation.  Tobey Jama Barnacle, DNP, [facility] Palliative Medicine Team Team Phone #  [phone]  Pager [phone]

## 2024-04-14 NOTE — Progress Notes (Signed)
 SATURATION QUALIFICATIONS: (This note is used to comply with regulatory documentation for home oxygen)  Patient Saturations on Room Air at Rest = 90-92%  Patient Saturations on Room Air while Ambulating = 89-96%  Patient Saturations on RA while Ambulating = 89-96%

## 2024-04-14 NOTE — Progress Notes (Signed)
 PT Cancellation Note  Patient Details Name: Jeff Romero MRN: [id number] DOB: 1961-03-11   Cancelled Treatment:    Reason Eval/Treat Not Completed: Patient declined, no reason specified. Pt reported he was unable to participate with therapy this am. PT to return if schedule allows. PT to continue to follow acutely.   [place], PT Acute Rehab   [place] VEAR Drone 04/14/2024, 10:29 AM

## 2024-04-15 ENCOUNTER — Other Ambulatory Visit (HOSPITAL_COMMUNITY): Payer: Self-pay

## 2024-04-15 DIAGNOSIS — G893 Neoplasm related pain (acute) (chronic): Secondary | ICD-10-CM | POA: Diagnosis not present

## 2024-04-15 LAB — CBC
HCT: 25.4 % — ABNORMAL LOW (ref 39.0–52.0)
Hemoglobin: 7.2 g/dL — ABNORMAL LOW (ref 13.0–17.0)
MCH: 24.5 pg — ABNORMAL LOW (ref 26.0–34.0)
MCHC: 28.3 g/dL — ABNORMAL LOW (ref 30.0–36.0)
MCV: 86.4 fL (ref 80.0–100.0)
Platelets: 323 K/uL (ref 150–400)
RBC: 2.94 MIL/uL — ABNORMAL LOW (ref 4.22–5.81)
RDW: 23.2 % — ABNORMAL HIGH (ref 11.5–15.5)
WBC: 7.4 K/uL (ref 4.0–10.5)
nRBC: 0 % (ref 0.0–0.2)

## 2024-04-15 LAB — COMPREHENSIVE METABOLIC PANEL WITH GFR
ALT: 95 U/L — ABNORMAL HIGH (ref 0–44)
AST: 97 U/L — ABNORMAL HIGH (ref 15–41)
Albumin: <1.5 g/dL — ABNORMAL LOW (ref 3.5–5.0)
Alkaline Phosphatase: 355 U/L — ABNORMAL HIGH (ref 38–126)
Anion gap: 8 (ref 5–15)
BUN: 10 mg/dL (ref 8–23)
CO2: 23 mmol/L (ref 22–32)
Calcium: 7.7 mg/dL — ABNORMAL LOW (ref 8.9–10.3)
Chloride: 104 mmol/L (ref 98–111)
Creatinine, Ser: 0.56 mg/dL — ABNORMAL LOW (ref 0.61–1.24)
GFR, Estimated: >60 mL/min (ref >60)
Glucose, Bld: 121 mg/dL — ABNORMAL HIGH (ref 70–99)
Potassium: 3.3 mmol/L — ABNORMAL LOW (ref 3.5–5.1)
Sodium: 135 mmol/L (ref 135–145)
Total Bilirubin: 0.7 mg/dL (ref 0.0–1.2)
Total Protein: 5.1 g/dL — ABNORMAL LOW (ref 6.5–8.1)

## 2024-04-15 MED ORDER — ONDANSETRON HCL 4 MG PO TABS
4.0000 mg | ORAL_TABLET | Freq: Four times a day (QID) | ORAL | 0 refills | Status: DC | PRN
Start: 2024-04-15 — End: 2024-05-02
  Filled 2024-04-15: qty 20, 5d supply, fill #0

## 2024-04-15 MED ORDER — GUAIFENESIN-CODEINE 100-10 MG/5ML PO SOLN
10.0000 mL | ORAL | 0 refills | Status: DC | PRN
  Filled 2024-04-15: qty 120, 2d supply, fill #0

## 2024-04-15 MED ORDER — OXYCODONE HCL 15 MG PO TABS
15.0000 mg | ORAL_TABLET | ORAL | 0 refills | Status: DC | PRN
  Filled 2024-04-15: qty 90, 15d supply, fill #0

## 2024-04-15 MED ORDER — HYDROMORPHONE HCL 2 MG PO TABS
1.0000 mg | ORAL_TABLET | ORAL | 0 refills | Status: DC | PRN
  Filled 2024-04-15: qty 20, 7d supply, fill #0

## 2024-04-15 MED ORDER — PANTOPRAZOLE SODIUM 40 MG PO TBEC
40.0000 mg | DELAYED_RELEASE_TABLET | Freq: Two times a day (BID) | ORAL | 0 refills | Status: DC
  Filled 2024-04-15: qty 60, 30d supply, fill #0

## 2024-04-15 MED ORDER — GABAPENTIN 100 MG PO CAPS
100.0000 mg | ORAL_CAPSULE | Freq: Three times a day (TID) | ORAL | 0 refills | Status: DC
Start: 2024-04-15 — End: 2024-05-02
  Filled 2024-04-15: qty 270, 90d supply, fill #0

## 2024-04-15 NOTE — Plan of Care (Signed)
  Problem: Education: Goal: Knowledge of General Education information will improve Description: Including pain rating scale, medication(s)/side effects and non-pharmacologic comfort measures Outcome: Progressing   Problem: Health Behavior/Discharge Planning: Goal: Ability to manage health-related needs will improve Outcome: Progressing   Problem: Clinical Measurements: Goal: Ability to maintain clinical measurements within normal limits will improve Outcome: Progressing Goal: Diagnostic test results will improve Outcome: Progressing   Problem: Activity: Goal: Risk for activity intolerance will decrease Outcome: Progressing   Problem: Nutrition: Goal: Adequate nutrition will be maintained Outcome: Progressing   Problem: Coping: Goal: Level of anxiety will decrease Outcome: Progressing   Problem: Elimination: Goal: Will not experience complications related to bowel motility Outcome: Progressing Goal: Will not experience complications related to urinary retention Outcome: Progressing   Problem: Pain Managment: Goal: General experience of comfort will improve and/or be controlled Outcome: Progressing   Problem: Safety: Goal: Ability to remain free from injury will improve Outcome: Progressing   Problem: Skin Integrity: Goal: Risk for impaired skin integrity will decrease Outcome: Progressing

## 2024-04-15 NOTE — Discharge Summary (Signed)
 Physician Discharge Summary  Jeff Romero FMW:[id number] DOB: 1960-12-15 DOA: 04/09/2024  PCP: [name]  Admit date: 04/09/2024 Discharge date: 04/15/2024  Admitted From: Home Disposition: Home  Recommendations for Outpatient Follow-up:  Follow up with PCP in 1-2 weeks Please obtain BMP/CBC in one week  Home Health: None Equipment/Devices none Discharge Condition: Stable CODE STATUS: Full code Diet recommendation: Soft regular diet   brief/Interim Summary: 63 yrs old unfortunate male with medical history significant for stage IV non-small cell lung cancer,  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  He has been under the care of [name], currently on Keytruda  and completed radiation. He has also been followed by palliative care for symptom management, recently his pain medication regimen was adjusted he has been placed on OxyContin  10 mg p.o. every 8 hours, as well as oxycodone  15 mg p.o. every 4 hours for breakthrough pain.  Patient continued to complain about severe throat pain which makes swallowing difficult, also has difficulty taking a lot of his p.o. medications, also reports difficulty tolerating drinking water and even nutritional supplements like Ensure.  He was hospitalized in June and was found at that time to have a right external jugular vein thrombosis in the setting of Eliquis .  He was transitioned to therapeutic Lovenox  which he is continuing.  Patient was admitted for further management.     Discharge Diagnoses:  Principal Problem:   Cancer-related breakthrough pain Active Problems:   Odynophagia   Acute esophagitis   Metastatic Stage IV non-small cell lung cancer: Unfortunately he has severe esophagitis with dysphagia status post radiation.   admitted to the hospital with evidence of  dehydration, severe pain, weight loss in the setting of inability to tolerate orally. He is currently on Keytruda . Continue OxyContin ,  po Dilaudid  for breakthrough pain   Hypokalemia- replete K 3.4 repleted   Right external jugular vein thrombosis : therapeutic Lovenox     Fever: Patient has spiked fever but there is no other signs of infection. Held antibiotics.  Follow-up cultures negative   Chronic pain syndrome: Continue OxyContin  and IV Dilaudid  for breakthrough pain.   Chronic normocytic normochromic anemia.: Prior baseline hemoglobin was 7.0, Hemoglobin 7.2 at the time of discharge  Throat pain: Treated with HurriCaine spray and Magic mouthwash. Barium swallow study 7/8-No significant esophageal dysmotility observed. Mild distal esophageal fold thickening may reflect mild esophagitis. No appreciable ulceration or stricture. No frank tracheal aspiration. GI recommended ENT consult.  Send message to [name] to see patient. EGD-- Congested, granular, texture changed mucosa in                            the proximal esophagus. Biopsied. More suggestive                            of radiation changes and mucositis rather than                            viral.                           - Texture changed mucosa in the esophagus in middle  esophagus. Biopsied.                           - Normal distal esophagus.                           - Z-line irregular, 40 cm from the incisors.                           - 1 cm hiatal hernia.                           - Erythematous mucosa in the stomach. Biopsied.                           - No gross lesions in the duodenal bulb, in the                            first portion of the duodenum and in the second                            portion of the duodenum.  Continue Protonix  and if able to tolerate.PROTONIX  40 MG BID WAS CALLED INTO [facility]                           swallowing then increasing up to  twice daily.                           - Continue Carafate  4 times daily.                           - May continue Magic Mouthwash.                                              Pressure Injury 03/15/24 Buttocks Left Stage 2 -  Partial thickness loss of dermis presenting as a shallow open injury with a red, pink wound bed without slough. Dry, Pink (Active)  03/15/24 2100  Location: Buttocks  Location Orientation: Left  Staging: Stage 2 -  Partial thickness loss of dermis presenting as a shallow open injury with a red, pink wound bed without slough.  Wound Description (Comments): Dry, Pink  DO NOT USE:  Present on Admission: Yes      Nutrition Problem: Severe Malnutrition Etiology: chronic illness, cancer and cancer related treatments    Signs/Symptoms: severe fat depletion, severe muscle depletion, energy intake < or equal to 50% for > or equal to 1 month, percent weight loss     Interventions: Boost Breeze, Prostat, Education  Estimated body mass index is 18.44 kg/m as calculated from the following:   Height as of this encounter: 5' 7 (1.702 m).   Weight as of this encounter: 53.4 kg.  Discharge Instructions  Discharge Instructions     Diet - low sodium heart healthy   Complete by: As directed    Increase activity slowly   Complete by: As directed       Allergies  as of 04/15/2024   No Known Allergies      Medication List     TAKE these medications    acetaminophen  500 MG tablet Commonly known as: TYLENOL  Take 2 tablets (1,000 mg total) by mouth every 8 (eight) hours.   albuterol  108 (90 Base) MCG/ACT inhaler Commonly known as: VENTOLIN  HFA Inhale 1 puff into the lungs every 6 (six) hours as needed for wheezing or shortness of breath.   benzonatate  200 MG capsule Commonly known as: TESSALON  Take 1 capsule (200 mg total) by mouth 3 (three) times daily.   enoxaparin  60 MG/0.6ML injection Commonly known as: LOVENOX  Inject 0.6 mLs (60 mg total) into the  skin 2 (two) times daily.   fenofibrate  160 MG tablet Take 160 mg by mouth daily.   ferrous sulfate 325 (65 FE) MG tablet Take 325 mg by mouth daily with breakfast.   gabapentin  100 MG capsule Commonly known as: NEURONTIN  Take 1 capsule (100 mg total) by mouth 3 (three) times daily. What changed: when to take this   guaiFENesin -codeine  100-10 MG/5ML syrup Take 10 mLs by mouth every 4 (four) hours as needed for cough.   HYDROcodone  bit-homatropine 5-1.5 MG/5ML syrup Commonly known as: HYCODAN Take 5 mLs by mouth every 8 (eight) hours as needed for cough.   HYDROmorphone  2 MG tablet Commonly known as: Dilaudid  Take 0.5 tablets (1 mg total) by mouth every 4 (four) hours as needed for up to 7 days for severe pain (pain score 7-10).   Keytruda  100 MG/4ML Soln Generic drug: pembrolizumab  Inject 200 mg into the vein every 21 ( twenty-one) days.   magic mouthwash (nystatin , diphenhydrAMINE , alum & mag hydroxide) suspension mixture Swish and swallow 10 mLs 4 (four) times daily as needed for mouth pain.   megestrol  20 MG tablet Commonly known as: MEGACE  Take 40 mg by mouth 4 (four) times daily -  with meals and at bedtime.   ondansetron  8 MG tablet Commonly known as: ZOFRAN  Take 0.5 tablets (4 mg total) by mouth every 8 (eight) hours as needed for nausea. What changed: Another medication with the same name was added. Make sure you understand how and when to take each.   ondansetron  4 MG tablet Commonly known as: ZOFRAN  Take 1 tablet (4 mg total) by mouth every 6 (six) hours as needed for nausea. What changed: You were already taking a medication with the same name, and this prescription was added. Make sure you understand how and when to take each.   OVER THE COUNTER MEDICATION Apply 1 Application topically daily. Topical cream with unknown active ingredient/strength. Sample given by oncologist in office for daily use on radiation burn.   oxyCODONE  10 mg 12 hr tablet Commonly  known as: OXYCONTIN  Take 1 tablet (10 mg total) by mouth every 8 (eight) hours. What changed: Another medication with the same name was removed. Continue taking this medication, and follow the directions you see here.   pantoprazole  40 MG tablet Commonly known as: PROTONIX  Take 1 tablet (40 mg total) by mouth daily.   polyethylene glycol powder 17 GM/SCOOP powder Commonly known as: GLYCOLAX /MIRALAX  Take 17 g by mouth daily.   rosuvastatin  10 MG tablet Commonly known as: CRESTOR  Take 10 mg by mouth daily.   Stool Softener/Laxative 50-8.6 MG tablet Generic drug: senna-docusate Take 2 tablets by mouth at bedtime.   sucralfate  1 g tablet Commonly known as: Carafate  Take 1 tablet (1 g total) by mouth 4 (four) times daily -  with meals and  at bedtime.        Follow-up Information     [facility]   Contact information: [address] [place] [address] [phone]                No Known Allergies  Consultations: GI ONC ENT DR ROARK   Procedures/Studies: DG Chest 1 View Result Date: 04/14/2024 CLINICAL DATA:  Cough.  Lung cancer. EXAM: CHEST  1 VIEW COMPARISON:  04/09/2024 FINDINGS: Progressive consolidation in the right upper lobe and potentially in the superior segment right lower lobe. Stable pleural thickening at the right lung apex. Left infrahilar fullness attributable to adenopathy, unchanged. Blunting of the right costophrenic angle compatible with right pleural effusion. Heart size within normal limits. Peripheral skin fold along the left upper chest is the favored cause of the edge in this vicinity, similar findings are present on the prior exam and left pneumothorax is not favored. IMPRESSION: 1. Progressive consolidation in the right upper lobe and potentially in the superior segment right lower lobe. Well pneumonia is likely, a component of radiation pneumonitis is not totally excluded. This has substantially worsened over  the last 5 days. 2. Right pleural effusion. 3. Left infrahilar adenopathy, unchanged. 4. Peripheral skin fold along the left upper chest is the favored cause of the edge in this vicinity, similar findings are present on the prior exam and left pneumothorax is not favored. Electronically Signed   By: Ryan Salvage M.D.   On: 04/14/2024 13:35   DG ESOPHAGUS W SINGLE CM (SOL OR THIN BA) Result Date: 04/11/2024 CLINICAL DATA:  Dysphagia, coughing, painful swallowing. EXAM: ESOPHOGRAM/BARIUM SWALLOW TECHNIQUE: Single contrast examination was performed using  thin barium. FLUOROSCOPY: Radiation Exposure Index (as provided by the fluoroscopic device): 7.8 mGy Kerma COMPARISON:  CT chest 03/15/2024 FINDINGS: Due to the patient's clinical condition, he was only able to assume the LPO position, all sequences are obtained with the patient LPO. The pharyngeal phase of swallowing is only seen in the oblique projection in passing. There were no episodes of tracheal aspiration. Primary peristaltic waves were intact on 4/4 swallows with only a small amount of proximal escape. Mild distal esophageal fold thickening may reflect mild esophagitis. No appreciable ulceration or overt stricture identified. IMPRESSION: 1. No significant esophageal dysmotility observed. 2. Mild distal esophageal fold thickening may reflect mild esophagitis. No appreciable ulceration or stricture. 3. No frank tracheal aspiration. 4. Entire exam performed with the patient in the LPO position due to the patient's condition. Electronically Signed   By: Ryan Salvage M.D.   On: 04/11/2024 15:27   DG Chest Port 1 View Result Date: 04/09/2024 CLINICAL DATA:  Short of breath stage IV lung cancer EXAM: PORTABLE CHEST 1 VIEW COMPARISON:  Radiograph 03/21/2024, CT 03/15/2024. FINDINGS: Normal cardiac silhouette. Significant interval increase in density of RIGHT upper lobe mass. Nodule in the superior segment of LEFT lower lobe also appears increased. No  pneumothorax. No pleural fluid. No acute osseous abnormality. IMPRESSION: 1. Increased masslike consolidation in the RIGHT upper lobe. Differential includes progressive pulmonary infection versus progressive neoplasm versus a combination of both. 2. Increase in volume of LEFT lobe nodule suggests progressive lung cancer. Electronically Signed   By: Jackquline Boxer M.D.   On: 04/09/2024 12:51   IR THORACENTESIS ASP PLEURAL SPACE W/IMG GUIDE Result Date: 03/21/2024 INDICATION: lung cancer, right pleural effusion causing cough Lung cancer with right pleural effusion. Request for diagnostic and therapeutic thoracentesis. EXAM: ULTRASOUND GUIDED RIGHT THORACENTESIS MEDICATIONS: 1% lidocaine   10 mL COMPLICATIONS: None immediate. PROCEDURE: An ultrasound guided thoracentesis was thoroughly discussed with the patient and questions answered. The benefits, risks, alternatives and complications were also discussed. The patient understands and wishes to proceed with the procedure. Written consent was obtained. Ultrasound was performed to localize and mark an adequate pocket of fluid in the RIGHT chest. The area was then prepped and draped in the normal sterile fashion. 1% Lidocaine  was used for local anesthesia. Under ultrasound guidance a 6 Fr Safe-T-Centesis catheter was introduced. Thoracentesis was performed. The catheter was removed and a dressing applied. FINDINGS: A total of approximately 550 mL of hazy gold fluid was removed. Samples were sent to the laboratory as requested by the clinical team. IMPRESSION: Successful ultrasound guided RIGHT thoracentesis yielding 550 mL of pleural fluid. No pneumothorax on post-procedure chest x-ray. Procedure performed by: Sari Lamp, PA-C Electronically Signed   By: Thom Hall M.D.   On: 03/21/2024 15:13   DG Chest 1 View Result Date: 03/21/2024 CLINICAL DATA:  Status post right thoracentesis EXAM: CHEST  1 VIEW COMPARISON:  Chest radiograph dated 02/12/2024 FINDINGS: Normal  lung volumes. Decreased hazy right lung opacity. Mild residual hazy right upper and right basilar opacities. Decreased small right pleural effusion, including a loculated right upper lung component. No pneumothorax. The heart size and mediastinal contours are within normal limits. No acute osseous abnormality. IMPRESSION: 1. Decreased small right pleural effusion, including a loculated right upper lung component. No pneumothorax. 2. Decreased hazy right lung opacity, likely atelectasis. Electronically Signed   By: Limin  Xu M.D.   On: 03/21/2024 14:45   (Echo, Carotid, EGD, Colonoscopy, ERCP)    Subjective:  TOLERATING diet wife at bedside Discharge Exam: Vitals:   04/15/24 0615 04/15/24 0717  BP: 128/66   Pulse:    Resp:    Temp: 98.6 F (37 C)   SpO2:  96%   Vitals:   04/14/24 2016 04/14/24 2104 04/15/24 0615 04/15/24 0717  BP: 125/65  128/66   Pulse:  (!) 110    Resp: 18     Temp: 99.1 F (37.3 C)  98.6 F (37 C)   TempSrc: Oral  Oral   SpO2: 93% 92%  96%  Weight:      Height:        General: Pt is alert, awake, not in acute distress Cardiovascular: RRR, S1/S2 +, no rubs, no gallops Respiratory:few rhonchi Abdominal: Soft, NT, ND, bowel sounds + Extremities: no edema, no cyanosis    The results of significant diagnostics from this hospitalization (including imaging, microbiology, ancillary and laboratory) are listed below for reference.     Microbiology: No results found for this or any previous visit (from the past 240 hours).   Labs: BNP (last 3 results) Recent Labs    02/13/24 0525 03/15/24 1234 04/09/24 1237  BNP 43.2 30.9 62.7   Basic Metabolic Panel: Recent Labs  Lab 04/10/24 0508 04/12/24 0624 04/13/24 0606 04/14/24 0529 04/15/24 0732  NA 139 136 133* 138 135  K 3.5 3.4* 3.0* 3.4* 3.3*  CL 107 103 100 105 104  CO2 24 25 23 26 23   GLUCOSE 113* 108* 153* 119* 121*  BUN 15 13 14 14 10   CREATININE 0.67 0.65 0.62 0.63 0.56*  CALCIUM  7.9* 8.0*  7.7* 8.0* 7.7*  MG  --   --  1.8  --   --    Liver Function Tests: Recent Labs  Lab 04/12/24 0624 04/13/24 0606 04/14/24 0529 04/15/24 0732  AST 249* 117*  112* 97*  ALT 161* 108* 101* 95*  ALKPHOS 221* 206* 265* 355*  BILITOT 0.8 1.0 1.0 0.7  PROT 5.5* 5.2* 5.4* 5.1*  ALBUMIN <1.5* <1.5* <1.5* <1.5*   No results for input(s): LIPASE, AMYLASE in the last 168 hours. No results for input(s): AMMONIA in the last 168 hours. CBC: Recent Labs  Lab 04/09/24 1237 04/10/24 0508 04/12/24 0624 04/13/24 0606 04/14/24 0529 04/15/24 0732  WBC 8.7 7.7 7.9 7.6 7.2 7.4  NEUTROABS 7.4  --   --   --   --   --   HGB 9.4* 7.6* 7.4* 7.1* 7.2* 7.2*  HCT 30.6* 24.8* 24.7* 23.3* 24.1* 25.4*  MCV 81.0 79.5* 82.3 81.5 83.1 86.4  PLT 251 245 241 235 289 323   Cardiac Enzymes: No results for input(s): CKTOTAL, CKMB, CKMBINDEX, TROPONINI in the last 168 hours. BNP: Invalid input(s): POCBNP CBG: No results for input(s): GLUCAP in the last 168 hours. D-Dimer No results for input(s): DDIMER in the last 72 hours. Hgb A1c No results for input(s): HGBA1C in the last 72 hours. Lipid Profile No results for input(s): CHOL, HDL, LDLCALC, TRIG, CHOLHDL, LDLDIRECT in the last 72 hours. Thyroid  function studies No results for input(s): TSH, T4TOTAL, T3FREE, THYROIDAB in the last 72 hours.  Invalid input(s): FREET3 Anemia work up No results for input(s): VITAMINB12, FOLATE, FERRITIN, TIBC, IRON, RETICCTPCT in the last 72 hours. Urinalysis    Component Value Date/Time   COLORURINE BROWN (A) 07/12/2016 1833   APPEARANCEUR CLOUDY (A) 07/12/2016 1833   LABSPEC 1.010 07/12/2016 1833   PHURINE >9.0 (H) 07/12/2016 1833   GLUCOSEU 100 (A) 07/12/2016 1833   HGBUR TRACE (A) 07/12/2016 1833   BILIRUBINUR LARGE (A) 07/12/2016 1833   KETONESUR 15 (A) 07/12/2016 1833   PROTEINUR 100 (A) 07/12/2016 1833   NITRITE POSITIVE (A) 07/12/2016 1833    LEUKOCYTESUR MODERATE (A) 07/12/2016 1833   Sepsis Labs Recent Labs  Lab 04/12/24 0624 04/13/24 0606 04/14/24 0529 04/15/24 0732  WBC 7.9 7.6 7.2 7.4   Microbiology No results found for this or any previous visit (from the past 240 hours).   Time coordinating discharge: 38 min SIGNED:   Almarie KANDICE Hoots, MD  [facility] 04/15/2024, 3:25 PM

## 2024-04-16 ENCOUNTER — Ambulatory Visit: Payer: Self-pay | Admitting: Gastroenterology

## 2024-04-17 ENCOUNTER — Telehealth: Payer: Self-pay | Admitting: Internal Medicine

## 2024-04-17 ENCOUNTER — Other Ambulatory Visit (HOSPITAL_COMMUNITY): Payer: Self-pay

## 2024-04-17 NOTE — Addendum Note (Signed)
 Addended by: Zenith Kercheval C on: 04/17/2024 12:22 PM   Modules accepted: Orders

## 2024-04-17 NOTE — Telephone Encounter (Signed)
 The patients wife called to cancel lab only appointment and to confirm appointments on 7/22.

## 2024-04-18 ENCOUNTER — Other Ambulatory Visit

## 2024-04-18 LAB — SURGICAL PATHOLOGY

## 2024-04-19 ENCOUNTER — Telehealth: Payer: Self-pay | Admitting: *Deleted
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
## 2024-04-19 NOTE — Telephone Encounter (Signed)
 Spoke with the patients wife to see how he is feeling since completion of his radiation treatments.  She reports he was discharged from the hospital on Sunday 7/13 after a week long stay.  She reports he is feeling better, pain has improved after changes to medication regimen.  She reports his swallowing is much better and his appetite has increased.  She reports she was unaware that he had any scans ordered or scheduled.  I let her know that a MRI brain and CT abdomen and pelvis is ordered and I would have the scheduler call her to arrange these scans.  She verbalized understanding.

## 2024-04-20 ENCOUNTER — Telehealth (HOSPITAL_COMMUNITY): Payer: Self-pay

## 2024-04-20 ENCOUNTER — Telehealth (HOSPITAL_COMMUNITY): Payer: Self-pay | Admitting: Pharmacy Technician

## 2024-04-20 ENCOUNTER — Other Ambulatory Visit

## 2024-04-20 ENCOUNTER — Encounter: Payer: Self-pay | Admitting: Internal Medicine

## 2024-04-20 ENCOUNTER — Other Ambulatory Visit (HOSPITAL_COMMUNITY): Payer: Self-pay

## 2024-04-20 ENCOUNTER — Other Ambulatory Visit: Payer: Self-pay | Admitting: Nurse Practitioner

## 2024-04-20 DIAGNOSIS — C3411 Malignant neoplasm of upper lobe, right bronchus or lung: Secondary | ICD-10-CM

## 2024-04-20 DIAGNOSIS — G893 Neoplasm related pain (acute) (chronic): Secondary | ICD-10-CM

## 2024-04-20 DIAGNOSIS — Z515 Encounter for palliative care: Secondary | ICD-10-CM

## 2024-04-20 MED ORDER — OXYCODONE HCL ER 10 MG PO T12A
10.0000 mg | EXTENDED_RELEASE_TABLET | Freq: Three times a day (TID) | ORAL | 0 refills | Status: DC
  Filled 2024-04-20: qty 15, 5d supply, fill #0
  Filled 2024-04-21: qty 75, 25d supply, fill #0

## 2024-04-20 MED ORDER — HYDROMORPHONE HCL 2 MG PO TABS
1.0000 mg | ORAL_TABLET | ORAL | 0 refills | Status: DC | PRN
  Filled 2024-04-20: qty 60, 20d supply, fill #0

## 2024-04-20 NOTE — Telephone Encounter (Signed)
 PA request has been Submitted. New Encounter has been or will be created for follow up. For additional info see Pharmacy Prior Auth telephone encounter from 04/20/24.

## 2024-04-20 NOTE — Telephone Encounter (Signed)
 Pharmacy Patient Advocate Encounter   Received notification from Patient Pharmacy that prior authorization for Oxycodone  10mg  is required/requested.   Insurance verification completed.   The patient is insured through [facility] ([facility]   Per test claim: PA required; PA submitted to above mentioned insurance via CoverMyMeds Key/confirmation #/EOC [id number] Status is pending

## 2024-04-21 ENCOUNTER — Other Ambulatory Visit (HOSPITAL_COMMUNITY): Payer: Self-pay

## 2024-04-24 ENCOUNTER — Other Ambulatory Visit (HOSPITAL_COMMUNITY): Payer: Self-pay

## 2024-04-24 ENCOUNTER — Encounter

## 2024-04-24 ENCOUNTER — Other Ambulatory Visit

## 2024-04-24 ENCOUNTER — Ambulatory Visit

## 2024-04-24 ENCOUNTER — Ambulatory Visit: Admitting: Physician Assistant

## 2024-04-24 NOTE — Progress Notes (Unsigned)
 Cardiology Office Note   Date:  04/26/2024  ID:  Jeff Romero, DOB 21-Sep-1961, MRN [id number] PCP: [facility] Providers Cardiologist:  None   History of Present Illness Jeff Romero is a 63 y.o. male with a past medical history of stage IV non-small cell lung cancer with mets to bone and lymph nodes, PE, right external jugular vein thrombus.  Presents today for evaluation of abnormal echocardiogram.  Patient's lung cancer is followed by oncology.  Also followed by palliative care for this.  In February/March 2025, patient developed persistent cough that intensified over time.  Initially was worked up in urgent care for presentation suggested bronchitis or allergies.  Despite medical management, his symptoms persisted and he was seen in the ED on 01/20/2024.  There, chest x-ray and CT scan revealed a right centrally necrotic mass with lymph node involvement.  Subsequent bronchoscopy and biopsy confirmed non-small cell lung cancer.  PET scan in 01/2024 showed a large right upper lobe mass, lymphadenopathy in right hilar, subcarinal, right paratracheal, internal mammary, right supra cleft navicular regions.  Also noticed hyper metabolic lesion in the right iliac bone.   Per oncology notes, the disease is palliative with no surgical options.  He he underwent a short course of palliative radiation therapy to help open the airway.  On 5/10 patient was admitted after he presented with pleuritic chest/back pain.  CTAP noted significant bilateral 2nd and 3rd order PE.  Underwent echocardiogram 02/13/2024 that showed EF 40-45%, grade 1 DD, normal RV systolic function, small pericardial effusion, moderate mitral valve regurgitation, moderate tricuspid valve regurgitation.  Treated with IV heparin , discharged on Eliquis .  Later started Keytruda  to be administered every 3 weeks in 02/2024.  On 03/13/24, it was noted that patient had new right JVD.  A cardiology consult was placed.   When patient was seen by oncology on 6/11, he had new right sided face and neck swelling and was sent to the ED.  In the ED, CTA chest showed small bilateral subsegmental PE, likely residual from prior PE.  CT neck noted thrombus of the right external jugular vein extending from the level of the angle of the mandible inferiorly into the supraclavicular fossa.  The vessel significantly distended with surrounding edema and stranding.  He was treated and discharged on Lovenox .  During that admission, patient also noted to have anemia with hemoglobin as low as 6.8.  After transfusion improved to 7.7.  Patient admitted 7/6 - 7/12 after he presented with breakthrough pain.  It was noted that patient has severe esophagitis with dysphagia status postradiation.  Was admitted to the hospital with evidence of dehydration, severe pain, weight loss.  Remained on Keytruda .  Today patient presents to establish care. His wife accompanies him and helps provide a lot of the information. Patient has been having issues with shortness of breath and wheezing. This is thought to be related to the location of his cancer, the cancer treatments, and his anemia. Notes occasional orthopnea as well. Has a persistent cough and coughs up thick, clear-brown sputum. He has received multiple blood transfusions in the past few months. He denies chest pain. Does have occasional palpitations, and his HR has been elevated at most of his recent visits. Occasionally gets orthopnea and some swelling in his ankles. He has been struggling to eat. In part, this is due to lack of appetite. He also got esophagitis after his radiation and has pain when eating.    Studies Reviewed  Cardiac  Studies & Procedures   ______________________________________________________________________________________________     ECHOCARDIOGRAM  ECHOCARDIOGRAM COMPLETE 02/13/2024  Narrative ECHOCARDIOGRAM REPORT    Patient Name:   Jeff Romero Date of Exam:  02/13/2024 Medical Rec #:  [id number]   Height:       67.0 in Accession #:    [id number]  Weight:       124.3 lb Date of Birth:  03/12/1961    BSA:          1.652 m Patient Age:    62 years    BP:           125/77 mmHg Patient Gender: M           HR:           108 bpm. Exam Location:  Inpatient  Procedure: 2D Echo, 3D Echo, Cardiac Doppler and Color Doppler (Both Spectral and Color Flow Doppler were utilized during procedure).  Indications:    Pulmonary Embolus I26.09  History:        Patient has no prior history of Echocardiogram examinations.  Sonographer:    Jeff Romero RCS Referring Phys: Jeff Romero  IMPRESSIONS   1. Left ventricular ejection fraction, by estimation, is 40 to 45%. Left ventricular ejection fraction by 3D volume is 40 %. The left ventricle has mildly decreased function. The left ventricle demonstrates global hypokinesis. Left ventricular diastolic parameters are consistent with Grade I diastolic dysfunction (impaired relaxation). 2. Right ventricular systolic function is normal. The right ventricular size is normal. Tricuspid regurgitation signal is inadequate for assessing PA pressure. 3. A small pericardial effusion is present. The pericardial effusion is posterior to the left ventricle. 4. The mitral valve is normal in structure. Moderate mitral valve regurgitation. No evidence of mitral stenosis. 5. Tricuspid valve regurgitation is moderate. 6. The aortic valve is normal in structure. Aortic valve regurgitation is mild. No aortic stenosis is present. 7. The inferior vena cava is normal in size with greater than 50% respiratory variability, suggesting right atrial pressure of 3 mmHg.  FINDINGS Left Ventricle: Left ventricular ejection fraction, by estimation, is 40 to 45%. Left ventricular ejection fraction by 3D volume is 40 %. The left ventricle has mildly decreased function. The left ventricle demonstrates global hypokinesis. The left ventricular  internal cavity size was normal in size. There is no left ventricular hypertrophy. Left ventricular diastolic parameters are consistent with Grade I diastolic dysfunction (impaired relaxation).  Right Ventricle: The right ventricular size is normal. No increase in right ventricular wall thickness. Right ventricular systolic function is normal. Tricuspid regurgitation signal is inadequate for assessing PA pressure.  Left Atrium: Left atrial size was normal in size.  Right Atrium: Right atrial size was normal in size.  Pericardium: A small pericardial effusion is present. The pericardial effusion is posterior to the left ventricle.  Mitral Valve: The mitral valve is normal in structure. Moderate mitral valve regurgitation. No evidence of mitral valve stenosis.  Tricuspid Valve: The tricuspid valve is normal in structure. Tricuspid valve regurgitation is moderate . No evidence of tricuspid stenosis.  Aortic Valve: The aortic valve is normal in structure. Aortic valve regurgitation is mild. No aortic stenosis is present. Aortic valve peak gradient measures 7.8 mmHg.  Pulmonic Valve: The pulmonic valve was not well visualized. Pulmonic valve regurgitation is trivial. No evidence of pulmonic stenosis.  Aorta: The aortic root is normal in size and structure.  Venous: The inferior vena cava is normal in size with greater than 50% respiratory variability, suggesting  right atrial pressure of 3 mmHg.  IAS/Shunts: No atrial level shunt detected by color flow Doppler.  Additional Comments: 3D was performed not requiring image post processing on an independent workstation and was abnormal.   LEFT VENTRICLE PLAX 2D LVIDd:         5.50 cm         Diastology LVIDs:         4.30 cm         LV e' medial:    9.90 cm/s LV PW:         2.05 cm         LV E/e' medial:  7.1 LV IVS:        0.50 cm         LV e' lateral:   14.80 cm/s LVOT diam:     1.90 cm         LV E/e' lateral: 4.8 LV SV:         51 LV SV  Index:   31 LVOT Area:     2.84 cm        3D Volume EF LV 3D EF:    Left ventricul LV Volumes (MOD)                            ar LV vol d, MOD    116.0 ml                   ejection A2C:                                        fraction LV vol d, MOD    94.4 ml                    by 3D A4C:                                        volume is LV vol s, MOD    59.5 ml                    40 %. A2C: LV vol s, MOD    63.3 ml A4C:                           3D Volume EF: LV SV MOD A2C:   56.5 ml       3D EF:        40 % LV SV MOD A4C:   94.4 ml       LV EDV:       140 ml LV SV MOD BP:    37.7 ml       LV ESV:       84 ml LV SV:        56 ml  RIGHT VENTRICLE             IVC RV S prime:     25.60 cm/s  IVC diam: 1.90 cm TAPSE (M-mode): 2.4 cm  LEFT ATRIUM             Index        RIGHT ATRIUM           Index  LA diam:        3.30 cm 2.00 cm/m   RA Area:     13.50 cm LA Vol (A2C):   38.4 ml 23.24 ml/m  RA Volume:   30.00 ml  18.16 ml/m LA Vol (A4C):   34.0 ml 20.58 ml/m LA Biplane Vol: 36.8 ml 22.27 ml/m AORTIC VALVE AV Area (Vmax): 2.19 cm AV Vmax:        140.00 cm/s AV Peak Grad:   7.8 mmHg LVOT Vmax:      108.00 cm/s LVOT Vmean:     73.900 cm/s LVOT VTI:       0.179 m  AORTA Ao Root diam: 3.70 cm Ao Asc diam:  3.20 cm  MITRAL VALVE MV Area (PHT): 6.27 cm     SHUNTS MV Decel Time: 121 msec     Systemic VTI:  0.18 m MV E velocity: 70.60 cm/s   Systemic Diam: 1.90 cm MV A velocity: 105.00 cm/s MV E/A ratio:  0.67  Kardie Tobb DO Electronically signed by Dub Huntsman DO Signature Date/Time: 02/13/2024/1:25:37 PM    Final          ______________________________________________________________________________________________       Risk Assessment/Calculations           Physical Exam VS:  BP 130/64   Pulse (!) 112   Ht 5' 7 (1.702 m)   Wt 121 lb (54.9 kg)   SpO2 96%   BMI 18.95 kg/m        Wt Readings from Last 3 Encounters:  04/26/24 121 lb (54.9  kg)  04/25/24 121 lb 9.6 oz (55.2 kg)  04/09/24 117 lb 11.6 oz (53.4 kg)    GEN: Thin, frail male. Sitting upright on the exam table  NECK: Mild JVD  CARDIAC: regular rhythm, tachycardic. no murmurs, rubs, gallops RESPIRATORY:  Clear to auscultation without rales, wheezing or rhonchi. Normal WOB on room air  ABDOMEN: Soft, non-tender, non-distended EXTREMITIES:  Trace edema in BLE. No deformity   ASSESSMENT AND PLAN  Sinus tachycardia  - Patient has been tachycardic at multiple recent appointments. EKG today shows sinus tachycardia with HR 112 BPM. Recent EKGs have also shown sinus tach  - Suspect tachycardia is in response to his anemia (hemoglobin 6.5 yesterday prior to blood transfusions today). Also could be in part due to malnutrition and cancer treatments  - Patient does have palpitations  - Start metoprolol  succinate 12.5 mg daily to help manage palpitations. I am hesitant to add any higher of a dose with his frailty  - Treat underlying causes- he receives blood transfusions through the cancer center when needed   Chronic HFrEF  Moderate MR  - Echocardiogram from 5/11 showed EF 40-45%, grade I DD, normal rV systolic function, small pericardial effusion, moderate MR  - Patient denies chest pain. Does have quite a bit of shortness of breath. I suspect this is largely due to his lung cancer and treatments, recent PE. Also likely in part due to his anemia. He does have some orthopnea and mild leg swelling. No symptoms or physical exam findings concerning for worsening pericardial effusion/tamponade  - Euvolemic on exam today. Has mild JVD on the right, but his wife reports that it is significantly improved to 03/2024 when he had a right external jugular vein thrombus.  - With is overall health, will focus on symptom management. No plans for ischemic evaluation at this time  - Start metoprolol  succinate 12.5 mg daily as above  - If BP tolerates,  plan to start spironolactone  12.5 mg in one  week. This may help with swelling and hypokalemia  - Ordered BMP to be collected in 2 weeks   HLD  - With patient's prognosis, OK to stop crestor  and fenofibrate    Recent PE Recent thrombus of the right external jugular vein  - On lovenox  per oncology   Other health conditions, managed by oncology, palliative care  - Stage IV non-small cell lung cancer with mets to bone, lymph nodes- on Keytruda . S/p palliative radiation  - Esophagitis post radiation therapy  - Chronic pain     Dispo: Follow up in 6 weeks with me   Signed, Rollo FABIENE Louder, PA-C

## 2024-04-25 ENCOUNTER — Inpatient Hospital Stay (HOSPITAL_BASED_OUTPATIENT_CLINIC_OR_DEPARTMENT_OTHER): Admitting: Physician Assistant

## 2024-04-25 ENCOUNTER — Inpatient Hospital Stay: Admitting: Nurse Practitioner

## 2024-04-25 ENCOUNTER — Telehealth: Payer: Self-pay

## 2024-04-25 ENCOUNTER — Inpatient Hospital Stay

## 2024-04-25 ENCOUNTER — Inpatient Hospital Stay: Admitting: Dietician

## 2024-04-25 ENCOUNTER — Telehealth: Payer: Self-pay | Admitting: Internal Medicine

## 2024-04-25 VITALS — BP 99/63 | HR 114 | Resp 18

## 2024-04-25 VITALS — BP 123/77 | HR 128 | Temp 99.2°F | Resp 16 | Wt 121.6 lb

## 2024-04-25 DIAGNOSIS — C3411 Malignant neoplasm of upper lobe, right bronchus or lung: Secondary | ICD-10-CM

## 2024-04-25 DIAGNOSIS — D649 Anemia, unspecified: Secondary | ICD-10-CM | POA: Diagnosis not present

## 2024-04-25 DIAGNOSIS — I2699 Other pulmonary embolism without acute cor pulmonale: Secondary | ICD-10-CM | POA: Diagnosis not present

## 2024-04-25 DIAGNOSIS — Z5112 Encounter for antineoplastic immunotherapy: Secondary | ICD-10-CM | POA: Diagnosis present

## 2024-04-25 DIAGNOSIS — R001 Bradycardia, unspecified: Secondary | ICD-10-CM | POA: Diagnosis not present

## 2024-04-25 DIAGNOSIS — C7951 Secondary malignant neoplasm of bone: Secondary | ICD-10-CM | POA: Diagnosis present

## 2024-04-25 DIAGNOSIS — Z79899 Other long term (current) drug therapy: Secondary | ICD-10-CM | POA: Diagnosis not present

## 2024-04-25 DIAGNOSIS — Z87891 Personal history of nicotine dependence: Secondary | ICD-10-CM | POA: Diagnosis not present

## 2024-04-25 DIAGNOSIS — Z79620 Long term (current) use of immunosuppressive biologic: Secondary | ICD-10-CM | POA: Diagnosis not present

## 2024-04-25 LAB — CBC WITH DIFFERENTIAL (CANCER CENTER ONLY)
Abs Immature Granulocytes: 0.07 K/uL (ref 0.00–0.07)
Basophils Absolute: 0 K/uL (ref 0.0–0.1)
Basophils Relative: 0 %
Eosinophils Absolute: 0 K/uL (ref 0.0–0.5)
Eosinophils Relative: 0 %
HCT: 21 % — ABNORMAL LOW (ref 39.0–52.0)
Hemoglobin: 6.5 g/dL — CL (ref 13.0–17.0)
Immature Granulocytes: 1 %
Lymphocytes Relative: 6 %
Lymphs Abs: 0.7 K/uL (ref 0.7–4.0)
MCH: 24.3 pg — ABNORMAL LOW (ref 26.0–34.0)
MCHC: 31 g/dL (ref 30.0–36.0)
MCV: 78.7 fL — ABNORMAL LOW (ref 80.0–100.0)
Monocytes Absolute: 0.9 K/uL (ref 0.1–1.0)
Monocytes Relative: 7 %
Neutro Abs: 10.5 K/uL — ABNORMAL HIGH (ref 1.7–7.7)
Neutrophils Relative %: 86 %
Platelet Count: 410 K/uL — ABNORMAL HIGH (ref 150–400)
RBC: 2.67 MIL/uL — ABNORMAL LOW (ref 4.22–5.81)
RDW: 22.4 % — ABNORMAL HIGH (ref 11.5–15.5)
WBC Count: 12.1 K/uL — ABNORMAL HIGH (ref 4.0–10.5)
nRBC: 0 % (ref 0.0–0.2)

## 2024-04-25 LAB — CMP (CANCER CENTER ONLY)
ALT: 21 U/L (ref 0–44)
AST: 22 U/L (ref 15–41)
Albumin: 2.3 g/dL — ABNORMAL LOW (ref 3.5–5.0)
Alkaline Phosphatase: 206 U/L — ABNORMAL HIGH (ref 38–126)
Anion gap: 9 (ref 5–15)
BUN: 10 mg/dL (ref 8–23)
CO2: 25 mmol/L (ref 22–32)
Calcium: 7.8 mg/dL — ABNORMAL LOW (ref 8.9–10.3)
Chloride: 102 mmol/L (ref 98–111)
Creatinine: 0.58 mg/dL — ABNORMAL LOW (ref 0.61–1.24)
GFR, Estimated: >60 mL/min (ref >60)
Glucose, Bld: 94 mg/dL (ref 70–99)
Potassium: 3.6 mmol/L (ref 3.5–5.1)
Sodium: 136 mmol/L (ref 135–145)
Total Bilirubin: 0.5 mg/dL (ref 0.0–1.2)
Total Protein: 6.2 g/dL — ABNORMAL LOW (ref 6.5–8.1)

## 2024-04-25 LAB — SAMPLE TO BLOOD BANK

## 2024-04-25 LAB — PREPARE RBC (CROSSMATCH)

## 2024-04-25 LAB — TSH: TSH: 2.48 u[IU]/mL (ref 0.350–4.500)

## 2024-04-25 MED ORDER — SODIUM CHLORIDE 0.9% FLUSH: 10.0000 mL | INTRAVENOUS | Status: DC | PRN

## 2024-04-25 MED ORDER — HEPARIN SOD (PORK) LOCK FLUSH 100 UNIT/ML IV SOLN
500.0000 [IU] | Freq: Once | INTRAVENOUS | Status: DC | PRN
Start: 2024-04-25 — End: 2024-04-25

## 2024-04-25 MED ORDER — ACETAMINOPHEN 325 MG PO TABS
650.0000 mg | ORAL_TABLET | Freq: Once | ORAL | Status: AC
  Administered 2024-04-25: 650 mg via ORAL
  Filled 2024-04-25: qty 2

## 2024-04-25 MED ORDER — ENOXAPARIN SODIUM 60 MG/0.6ML IJ SOSY: 60.0000 mg | PREFILLED_SYRINGE | Freq: Two times a day (BID) | INTRAMUSCULAR | 2 refills | Status: DC

## 2024-04-25 MED ORDER — SODIUM CHLORIDE 0.9% IV SOLUTION
250.0000 mL | INTRAVENOUS | Status: DC
  Administered 2024-04-25: 100 mL via INTRAVENOUS

## 2024-04-25 MED ORDER — DIPHENHYDRAMINE HCL 25 MG PO CAPS
25.0000 mg | ORAL_CAPSULE | Freq: Once | ORAL | Status: AC
  Administered 2024-04-25: 25 mg via ORAL
  Filled 2024-04-25: qty 1

## 2024-04-25 MED ORDER — SODIUM CHLORIDE 0.9 % IV SOLN
200.0000 mg | Freq: Once | INTRAVENOUS | Status: AC
  Administered 2024-04-25: 200 mg via INTRAVENOUS
  Filled 2024-04-25: qty 200

## 2024-04-25 MED ORDER — SODIUM CHLORIDE 0.9 % IV SOLN: INTRAVENOUS | Status: DC

## 2024-04-25 MED ORDER — SODIUM CHLORIDE 0.9% FLUSH: 3.0000 mL | INTRAVENOUS | Status: DC | PRN

## 2024-04-25 NOTE — Telephone Encounter (Signed)
 Left patient a vm regarding upcoming appointment

## 2024-04-25 NOTE — Progress Notes (Signed)
 Nutrition Follow-up:  Pt with malignant neoplasm of upper lobe of right lung metastatic to right mammary, lymph, right iliac bone. He is receiving palliative immunotherapy concurrent with radiation. Patient completed radiation 6/27 under the care of Dr. Dewey. Patient followed by Dr. Sherrod.   7/6-7/12 - cancer-related pain, acute esophagitis  Met with patient and wife in infusion. Patient is feeling tired at visit. Planning to receive 2 units of blood products tomorrow (7/23). Reports odynophagia has resolved. He is eating better. Wife is encouraging po every few hours. He is avoiding dairy and milky supplements as this is reported to be contributing to thick mucous. Patient reports this is difficult to get up and out. Patient tried [employer] during admission which he liked. Wife is asking where she can purchase these.   Medications: lovenox , oxycodone , dilaudid   Labs: Hgb 6.8, Cr 0.58, albumin 2.3 (trending up 1.6)  Anthropometrics: Wt 121 lb 9.6 oz today - increased  7/6 - 117 lb 11.6 oz    Estimated Energy Needs (30-35g/55.2kg to promote wt gain)  Kcals: 1650-1930 Protein: 82-93 Fluid: >1.5 L  NUTRITION DIAGNOSIS: Inadequate oral intake improving    INTERVENTION:  Encourage high calorie high protein foods to promote wt gain Continue daily Boost Breeze - coupons provided, educated on on-line availability Suggested mucinex  dm for thinning of mucous - okay to try per PA-C Suggested sprite/ginger ale rinses as well as running cool mist humidifier overnight    MONITORING, EVALUATION, GOAL: wt trends, intake   NEXT VISIT: Monday August 11 during infusion with Joli

## 2024-04-25 NOTE — Patient Instructions (Addendum)

## 2024-04-25 NOTE — Progress Notes (Signed)
 Jeff Romero

## 2024-04-25 NOTE — Telephone Encounter (Signed)
 Pt wife, paulette, called to r/s palliative appt, appt scheduled already, reviewed, no further needs at this time.

## 2024-04-26 ENCOUNTER — Other Ambulatory Visit: Payer: Self-pay | Admitting: Radiation Oncology

## 2024-04-26 ENCOUNTER — Encounter: Payer: Self-pay | Admitting: Cardiology

## 2024-04-26 ENCOUNTER — Other Ambulatory Visit (HOSPITAL_COMMUNITY): Payer: Self-pay

## 2024-04-26 ENCOUNTER — Ambulatory Visit: Attending: Cardiology | Admitting: Cardiology

## 2024-04-26 ENCOUNTER — Inpatient Hospital Stay

## 2024-04-26 ENCOUNTER — Other Ambulatory Visit: Payer: Self-pay

## 2024-04-26 ENCOUNTER — Encounter: Payer: Self-pay | Admitting: Internal Medicine

## 2024-04-26 VITALS — BP 130/64 | HR 112 | Ht 67.0 in | Wt 121.0 lb

## 2024-04-26 DIAGNOSIS — Z5112 Encounter for antineoplastic immunotherapy: Secondary | ICD-10-CM | POA: Diagnosis not present

## 2024-04-26 DIAGNOSIS — D649 Anemia, unspecified: Secondary | ICD-10-CM

## 2024-04-26 DIAGNOSIS — I340 Nonrheumatic mitral (valve) insufficiency: Secondary | ICD-10-CM | POA: Diagnosis not present

## 2024-04-26 DIAGNOSIS — R000 Tachycardia, unspecified: Secondary | ICD-10-CM

## 2024-04-26 DIAGNOSIS — I5022 Chronic systolic (congestive) heart failure: Secondary | ICD-10-CM | POA: Diagnosis not present

## 2024-04-26 DIAGNOSIS — I2699 Other pulmonary embolism without acute cor pulmonale: Secondary | ICD-10-CM | POA: Diagnosis not present

## 2024-04-26 DIAGNOSIS — C3490 Malignant neoplasm of unspecified part of unspecified bronchus or lung: Secondary | ICD-10-CM

## 2024-04-26 DIAGNOSIS — I82890 Acute embolism and thrombosis of other specified veins: Secondary | ICD-10-CM

## 2024-04-26 LAB — T4: T4, Total: 8 ug/dL (ref 4.5–12.0)

## 2024-04-26 MED ORDER — METOPROLOL SUCCINATE ER 25 MG PO TB24
12.5000 mg | ORAL_TABLET | Freq: Every day | ORAL | 3 refills | Status: DC
Start: 2024-04-26 — End: 2024-05-12
  Filled 2024-04-26: qty 15, 30d supply, fill #0
  Filled 2024-05-09: qty 15, 30d supply, fill #1

## 2024-04-26 MED ORDER — SPIRONOLACTONE 25 MG PO TABS
12.5000 mg | ORAL_TABLET | Freq: Every day | ORAL | 3 refills | Status: DC
  Filled 2024-04-26: qty 15, 30d supply, fill #0

## 2024-04-26 MED ORDER — ACETAMINOPHEN 325 MG PO TABS
650.0000 mg | ORAL_TABLET | Freq: Once | ORAL | Status: AC
  Administered 2024-04-26: 650 mg via ORAL
  Filled 2024-04-26: qty 2

## 2024-04-26 MED ORDER — SODIUM CHLORIDE 0.9% IV SOLUTION
250.0000 mL | INTRAVENOUS | Status: DC
  Administered 2024-04-26: 100 mL via INTRAVENOUS

## 2024-04-26 MED ORDER — DIPHENHYDRAMINE HCL 25 MG PO CAPS
25.0000 mg | ORAL_CAPSULE | Freq: Once | ORAL | Status: AC
  Administered 2024-04-26: 25 mg via ORAL
  Filled 2024-04-26: qty 1

## 2024-04-26 NOTE — Progress Notes (Signed)
 Placed order for 1 unit of blood to transfuse today

## 2024-04-26 NOTE — Patient Instructions (Signed)
 Medication Instructions:  Stop the Fenofibrate   Stop Crestor  Start Metoprolol  succinate ER 12.5 mg (1/2 tablet) once a day Next week start spirolactone 12.5 mg once a day for systolic blood pressure greater than 105 after starting metoprolol  *If you need a refill on your cardiac medications before your next appointment, please call your pharmacy*  Lab Work: In 2 weeks we are going to draw a Bmet If you have labs (blood work) drawn today and your tests are completely normal, you will receive your results only by: MyChart Message (if you have MyChart) OR A paper copy in the mail If you have any lab test that is abnormal or we need to change your treatment, we will call you to review the results.  Testing/Procedures: No testing  Follow-Up: At [facility], you and your health needs are our priority.  As part of our continuing mission to provide you with exceptional heart care, our providers are all part of one team.  This team includes your primary Cardiologist (physician) and Advanced Practice Providers or APPs (Physician Assistants and Nurse Practitioners) who all work together to provide you with the care you need, when you need it.  Your next appointment:   6 week(s)  Provider:   Any APP  We recommend signing up for the patient portal called MyChart.  Sign up information is provided on this After Visit Summary.  MyChart is used to connect with patients for Virtual Visits (Telemedicine).  Patients are able to view lab/test results, encounter notes, upcoming appointments, etc.  Non-urgent messages can be sent to your provider as well.   To learn more about what you can do with MyChart, go to [email or web address].

## 2024-04-26 NOTE — Patient Instructions (Signed)

## 2024-04-27 ENCOUNTER — Encounter: Payer: Self-pay | Admitting: Radiation Oncology

## 2024-04-27 ENCOUNTER — Ambulatory Visit

## 2024-04-27 ENCOUNTER — Ambulatory Visit: Admitting: Physician Assistant

## 2024-04-27 ENCOUNTER — Other Ambulatory Visit

## 2024-04-27 LAB — TYPE AND SCREEN
ABO/RH(D): A POS
Antibody Screen: POSITIVE
Donor AG Type: NEGATIVE
Donor AG Type: NEGATIVE
Unit division: 0
Unit division: 0

## 2024-04-27 LAB — BPAM RBC
Blood Product Expiration Date: 202508122359
Blood Product Unit Number: 202508162359
ISSUE DATE / TIME: 202507230845
PRODUCT CODE: 202507230845
PRODUCT CODE: 202508122359
Unit Type and Rh: 6200
Unit Type and Rh: 202508162359
Unit Type and Rh: 6200
Unit Type and Rh: 6200

## 2024-04-28 NOTE — Progress Notes (Signed)
  Radiation Oncology         [phone]) [phone] ________________________________  Name: Jeff Romero MRN: [id number]  Date of Service: 05/01/2024  DOB: [birth year]  Post Treatment Telephone Note  Diagnosis:  Stage IV, cT2bN2M1c, NSCLC, NOS of the RUL. (as documented in provider EOT note)   The patient was not available for call today.   The patient has scheduled follow up with his medical oncologist Dr. Sherrod for ongoing care, and was encouraged to call if he develops concerns or questions regarding radiation.  This concludes the interaction.  Rosaline Minerva, LPN

## 2024-04-29 ENCOUNTER — Encounter (HOSPITAL_COMMUNITY): Payer: Self-pay

## 2024-04-29 ENCOUNTER — Ambulatory Visit (HOSPITAL_COMMUNITY)
Admission: RE | Admit: 2024-04-29 | Discharge: 2024-04-29 | Disposition: A | Discharge status: Home / Self Care | Source: Ambulatory Visit | Attending: Radiation Oncology | Admitting: Radiation Oncology

## 2024-04-29 DIAGNOSIS — C3490 Malignant neoplasm of unspecified part of unspecified bronchus or lung: Secondary | ICD-10-CM | POA: Insufficient documentation

## 2024-04-29 MED ORDER — GADOBUTROL 1 MMOL/ML IV SOLN
6.0000 mL | Freq: Once | INTRAVENOUS | Status: AC | PRN
  Administered 2024-04-29: 6 mL via INTRAVENOUS

## 2024-05-01 ENCOUNTER — Ambulatory Visit
Admission: RE | Admit: 2024-05-01 | Discharge: 2024-05-01 | Disposition: A | Discharge status: Home / Self Care | Source: Ambulatory Visit | Attending: Radiation Oncology | Admitting: Radiation Oncology

## 2024-05-01 DIAGNOSIS — C3411 Malignant neoplasm of upper lobe, right bronchus or lung: Secondary | ICD-10-CM | POA: Insufficient documentation

## 2024-05-01 DIAGNOSIS — C7951 Secondary malignant neoplasm of bone: Secondary | ICD-10-CM | POA: Insufficient documentation

## 2024-05-01 DIAGNOSIS — D649 Anemia, unspecified: Secondary | ICD-10-CM | POA: Insufficient documentation

## 2024-05-01 DIAGNOSIS — Z87891 Personal history of nicotine dependence: Secondary | ICD-10-CM | POA: Insufficient documentation

## 2024-05-01 DIAGNOSIS — Z79899 Other long term (current) drug therapy: Secondary | ICD-10-CM | POA: Insufficient documentation

## 2024-05-01 DIAGNOSIS — Z5112 Encounter for antineoplastic immunotherapy: Secondary | ICD-10-CM | POA: Insufficient documentation

## 2024-05-01 DIAGNOSIS — Z510 Encounter for antineoplastic radiation therapy: Secondary | ICD-10-CM | POA: Insufficient documentation

## 2024-05-01 DIAGNOSIS — Z79620 Long term (current) use of immunosuppressive biologic: Secondary | ICD-10-CM | POA: Insufficient documentation

## 2024-05-02 ENCOUNTER — Inpatient Hospital Stay

## 2024-05-02 ENCOUNTER — Other Ambulatory Visit (HOSPITAL_COMMUNITY): Payer: Self-pay

## 2024-05-02 ENCOUNTER — Inpatient Hospital Stay (HOSPITAL_BASED_OUTPATIENT_CLINIC_OR_DEPARTMENT_OTHER): Admitting: Nurse Practitioner

## 2024-05-02 ENCOUNTER — Encounter: Payer: Self-pay | Admitting: Nurse Practitioner

## 2024-05-02 VITALS — BP 98/71 | HR 118 | Temp 97.9°F | Ht 67.0 in | Wt 121.8 lb

## 2024-05-02 DIAGNOSIS — G893 Neoplasm related pain (acute) (chronic): Secondary | ICD-10-CM

## 2024-05-02 DIAGNOSIS — R630 Anorexia: Secondary | ICD-10-CM | POA: Diagnosis not present

## 2024-05-02 DIAGNOSIS — Z515 Encounter for palliative care: Secondary | ICD-10-CM

## 2024-05-02 DIAGNOSIS — C3411 Malignant neoplasm of upper lobe, right bronchus or lung: Secondary | ICD-10-CM

## 2024-05-02 DIAGNOSIS — F32A Depression, unspecified: Secondary | ICD-10-CM

## 2024-05-02 DIAGNOSIS — R530 Neoplastic (malignant) related fatigue: Secondary | ICD-10-CM

## 2024-05-02 DIAGNOSIS — R634 Abnormal weight loss: Secondary | ICD-10-CM

## 2024-05-02 DIAGNOSIS — Z5112 Encounter for antineoplastic immunotherapy: Secondary | ICD-10-CM | POA: Diagnosis not present

## 2024-05-02 LAB — CBC WITH DIFFERENTIAL (CANCER CENTER ONLY)
Abs Immature Granulocytes: 0.06 K/uL (ref 0.00–0.07)
Basophils Absolute: 0 K/uL (ref 0.0–0.1)
Basophils Relative: 0 %
Eosinophils Absolute: 0 K/uL (ref 0.0–0.5)
Eosinophils Relative: 0 %
HCT: 31.9 % — ABNORMAL LOW (ref 39.0–52.0)
Hemoglobin: 9.9 g/dL — ABNORMAL LOW (ref 13.0–17.0)
Immature Granulocytes: 1 %
Lymphocytes Relative: 4 %
Lymphs Abs: 0.6 K/uL — ABNORMAL LOW (ref 0.7–4.0)
MCH: 25.7 pg — ABNORMAL LOW (ref 26.0–34.0)
MCHC: 31 g/dL (ref 30.0–36.0)
MCV: 82.9 fL (ref 80.0–100.0)
Monocytes Absolute: 0.9 K/uL (ref 0.1–1.0)
Monocytes Relative: 7 %
Neutro Abs: 11.8 K/uL — ABNORMAL HIGH (ref 1.7–7.7)
Neutrophils Relative %: 88 %
Platelet Count: 392 K/uL (ref 150–400)
RBC: 3.85 MIL/uL — ABNORMAL LOW (ref 4.22–5.81)
RDW: 19.3 % — ABNORMAL HIGH (ref 11.5–15.5)
WBC Count: 13.3 K/uL — ABNORMAL HIGH (ref 4.0–10.5)
nRBC: 0 % (ref 0.0–0.2)

## 2024-05-02 LAB — CMP (CANCER CENTER ONLY)
ALT: 16 U/L (ref 0–44)
AST: 16 U/L (ref 15–41)
Albumin: 2.4 g/dL — ABNORMAL LOW (ref 3.5–5.0)
Alkaline Phosphatase: 186 U/L — ABNORMAL HIGH (ref 38–126)
Anion gap: 10 (ref 5–15)
BUN: 11 mg/dL (ref 8–23)
CO2: 26 mmol/L (ref 22–32)
Calcium: 8.5 mg/dL — ABNORMAL LOW (ref 8.9–10.3)
Chloride: 101 mmol/L (ref 98–111)
Creatinine: 0.73 mg/dL (ref 0.61–1.24)
GFR, Estimated: >60 mL/min (ref >60)
Glucose, Bld: 133 mg/dL — ABNORMAL HIGH (ref 70–99)
Potassium: 3.6 mmol/L (ref 3.5–5.1)
Sodium: 137 mmol/L (ref 135–145)
Total Bilirubin: 0.7 mg/dL (ref 0.0–1.2)
Total Protein: 6.7 g/dL (ref 6.5–8.1)

## 2024-05-02 LAB — SAMPLE TO BLOOD BANK

## 2024-05-02 MED ORDER — GABAPENTIN 100 MG PO CAPS
100.0000 mg | ORAL_CAPSULE | Freq: Three times a day (TID) | ORAL | 2 refills | Status: DC
  Filled 2024-05-02: qty 270, 90d supply, fill #0
  Filled 2024-05-04: qty 90, 30d supply, fill #0

## 2024-05-02 NOTE — Progress Notes (Signed)
 Palliative Medicine [facility]  [phone] [phone]   Name: Jeff Romero Date: 05/02/2024 MRN: [id number]  DOB: 1961-09-10  Patient Care Team: Center, [facility] as PCP - Diedre Fell, Ozell, MD as PCP - Cardiology (Cardiology) Prentis Duwaine BROCKS, RN as Oncology Nurse Navigator Pickenpack-Cousar, Fannie SAILOR, NP as Nurse Practitioner [facility] and Palliative Medicine)    INTERVAL HISTORY: Jeff Romero is a 63 y.o. 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  Palliative is seeing patient for symptom management and goals of care.   SOCIAL HISTORY:     reports that he quit smoking about 9 years ago. His smoking use included e-cigarettes. He has never used smokeless tobacco. He reports current alcohol use of about 6.0 standard drinks of alcohol per week. He reports that he does not use drugs.  ADVANCE DIRECTIVES:  None on file   CODE STATUS: Full code  PAST MEDICAL HISTORY: Past Medical History:  Diagnosis Date   Allergy    Anxiety    Arthritis    Blood transfusion without reported diagnosis    Cataract    Depression    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   Prostate cancer (HCC)     ALLERGIES:  has no known allergies.  MEDICATIONS:  Current Outpatient Medications  Medication Sig Dispense Refill   acetaminophen  (TYLENOL ) 500 MG tablet Take 2 tablets (1,000 mg total) by mouth every 8 (eight) hours.     albuterol  (VENTOLIN  HFA) 108 (90 Base) MCG/ACT inhaler Inhale 1 puff into the lungs every 6 (six) hours as needed for wheezing or shortness of breath.     benzonatate  (TESSALON ) 200 MG capsule Take 1 capsule (200 mg total) by mouth 3 (three) times daily. 90 capsule 1   enoxaparin  (LOVENOX ) 60 MG/0.6ML injection Inject 0.6 mLs (60 mg total) into the skin 2 (two) times daily. 36 mL 2   ferrous  sulfate 325 (65 FE) MG tablet Take 325 mg by mouth daily with breakfast.     guaiFENesin -codeine  100-10 MG/5ML syrup Take 10 mLs by mouth every 4 (four) hours as needed for cough. 120 mL 0   HYDROcodone  bit-homatropine (HYCODAN) 5-1.5 MG/5ML syrup Take 5 mLs by mouth every 8 (eight) hours as needed for cough. 120 mL 0   HYDROmorphone  (DILAUDID ) 2 MG tablet Take 0.5 tablets (1 mg total) by mouth every 4 (four) hours as needed for severe pain (pain score 7-10). (Patient taking differently: Take 1 mg by mouth in the morning, at noon, in the evening, and at bedtime.) 60 tablet 0   megestrol  (MEGACE ) 20 MG tablet Take 40 mg by mouth 4 (four) times daily -  with meals and at bedtime.     metoprolol  succinate (TOPROL  XL) 25 MG 24 hr tablet Take 0.5 tablets (12.5 mg total) by mouth daily. 45 tablet 3   ondansetron  (ZOFRAN ) 8 MG tablet Take 0.5 tablets (4 mg total) by mouth every 8 (eight) hours as needed for nausea. 45 tablet 2   OVER THE COUNTER MEDICATION Apply 1 Application topically daily. Topical cream with unknown active ingredient/strength. Sample given by oncologist in office for daily use on radiation burn.     oxyCODONE  (OXYCONTIN ) 10 mg 12 hr tablet Take 1 tablet (10 mg total) by mouth every 8 (eight) hours. 90 tablet 0   pantoprazole  (PROTONIX ) 40 MG tablet Take 1 tablet (  40 mg total) by mouth 2 (two) times daily. 60 tablet 0   pembrolizumab  (KEYTRUDA ) 100 MG/4ML SOLN Inject 200 mg into the vein every 21 ( twenty-one) days.     polyethylene glycol powder (GLYCOLAX /MIRALAX ) 17 GM/SCOOP powder Take 17 g by mouth daily. 476 g 1   senna-docusate (SENOKOT-S) 8.6-50 MG tablet Take 2 tablets by mouth at bedtime. 120 tablet 0   spironolactone  (ALDACTONE ) 25 MG tablet Take 0.5 tablets (12.5 mg total) by mouth daily. 45 tablet 3   gabapentin  (NEURONTIN ) 100 MG capsule Take 1 capsule (100 mg total) by mouth 3 (three) times daily. 270 capsule 2   No current facility-administered medications for this visit.     VITAL SIGNS: BP 98/71 (BP Location: Right Arm, Patient Position: Sitting)   Pulse (!) 118   Temp 97.9 F (36.6 C) (Temporal)   Ht 5' 7 (1.702 m)   Wt 121 lb 12.8 oz (55.2 kg)   SpO2 99%   BMI 19.08 kg/m  Filed Weights   05/02/24 0902  Weight: 121 lb 12.8 oz (55.2 kg)     Estimated body mass index is 19.08 kg/m as calculated from the following:   Height as of this encounter: 5' 7 (1.702 m).   Weight as of this encounter: 121 lb 12.8 oz (55.2 kg).   PERFORMANCE STATUS (ECOG) : 1 - Symptomatic but completely ambulatory  General: NAD Cardiovascular: regular rate and rhythm Pulmonary: normal breathing pattern Extremities: no edema, no joint deformities Skin: no rashes Neurological: AAO x3  IMPRESSION: Discussed the use of AI scribe software for clinical note transcription with the patient, who gave verbal consent to proceed.  History of Present Illness Jeff Romero is a 63 year old male with non-small cell lung cancer who presents to clinic today for symptom management follow-up. He is accompanied by his wife. Denies concerns for nausea, vomiting, constipation, or diarrhea. Persistent fatigue however wife tries to keep patient as active as possible.   His appetite remains poor, and he struggles to eat regular meals. His wife encourages small, frequent meals and snacks throughout the day to increase his caloric intake. He has been consuming items like [place] sausage, egg and cheese sandwiches, and tuna fish, but often only manages to eat half portions. Current weight is 121lbs. Patient does not tolerate protein supplements due to bulk and milk product involved. We discussed ways to focus on protein intake. Dysphagia is much improved.   He takes gabapentin  twice daily for coughing, which has shown improvement in reducing the severity and frequency of his cough. He still experiences morning coughs but notes they are less violent and not constant throughout the day. Patient  previously ordered a suction for home however this is still in progress.   He experiences persistent pain managed with OxyContin  10 mg every eight hours and Dilaudid  every four hours as needed. Despite this regimen, her reports on average his pain level decreases to a five or six. He also uses Tylenol  to help manage the pain. Patient instructed to increase gabapentin  to three times daily.   He experiences depression, attributed to the cumulative effects of his symptoms, including persistent symptom burden and diagnosis. He is on medication for depression, managed by his primary care doctor at [facility]. Jeff Romero states he has noticed an increase in his depression as it relates to his overall cancer diagnosis and physiological changes and symptoms. Emotional support provided. Patient and wife express interest in counseling to allow him to be able to  discuss his thoughts and feelings as he has shown signs of being stuck since receiving his cancer diagnosis specifically not engaging in things he once enjoyed. His wife expresses hopes that patient will begin to enjoy life despite his health challenges.   All questions answered and support provided.   I discussed the importance of continued conversation with family and their medical providers regarding overall plan of care and treatment options, ensuring decisions are within the context of the patients values and GOCs. Assessment & Plan Pain management Cancer related pain much improved on current regimen. Patient states as long as medications are taken as scheduled he is comfortable despite throat pain.  - Continue OxyContin  10mg  every eight hours with two extra strength acetaminophen  (500 mg each). - Continue oxycodone  15 mg every four hours as needed. Continue Tylenol  as needed every 8 hours - Increase gabapentin  to three times a day - Consider medical marijuana for pain and appetite, ensuring a 2-hour gap from pain  medications  Constipation Constipation improved.  - Continue senna at bedtime.  -Continue daily Miralax    Cough with sputum production Cough with sputum production improved with gabapentin , reducing frequency and severity. Still present in the mornings but less violent. Gabapentin  initially prescribed for cough, now also aiding in pain management. - Continue gabapentin  three times a day - Continue Tessalon  Perles for cough as needed  Depression Depression exacerbated by physical symptoms and pain. Managed by primary care physician at [facility]. Depression suspected due to lack of motivation and staying in bed. Discussed potential benefit of counseling and support groups. Devere, a Journalist, newspaper, available for one-on-one sessions and support groups. - Continue current depression medication as prescribed by primary care physician. -Referral to SW for counseling and support.   Poor appetite and weight loss Poor appetite and weight loss ongoing. Weight stable at 121 lbs. Efforts to increase caloric intake with small frequent meals and high-calorie foods. Discussed potential use of medical marijuana to stimulate appetite. Mannose used four times a day to aid appetite. - Continue Megace  four times a day - Encourage small frequent meals - Consider medical marijuana for appetite stimulation  Goals of Care Discussed symptom management to improve quality of life, emphasizing medication timing. - Patient is clear in expressed wishes to continue to treat the treatable allowing him every opportunity to thrive.  - Ensure medications are taken at appropriate intervals to avoid excessive drowsiness.  I will plan to see patient back in 2-3 weeks. Sooner if needed.   Patient expressed understanding and was in agreement with this plan. He also understands that He can call the clinic at any time with any questions, concerns, or complaints.   Any controlled substances utilized were prescribed in  the context of palliative care. PDMP has been reviewed.   Visit consisted of counseling and education dealing with the complex and emotionally intense issues of symptom management and palliative care in the setting of serious and potentially life-threatening illness.  Levon Borer, AGPCNP-BC  Palliative Medicine Team/[facility]

## 2024-05-04 ENCOUNTER — Ambulatory Visit (HOSPITAL_COMMUNITY)
Admission: RE | Admit: 2024-05-04 | Discharge: 2024-05-04 | Disposition: A | Discharge status: Home / Self Care | Source: Ambulatory Visit | Attending: Physician Assistant | Admitting: Physician Assistant

## 2024-05-04 ENCOUNTER — Other Ambulatory Visit (HOSPITAL_COMMUNITY): Payer: Self-pay

## 2024-05-04 DIAGNOSIS — D649 Anemia, unspecified: Secondary | ICD-10-CM | POA: Diagnosis present

## 2024-05-04 DIAGNOSIS — C3411 Malignant neoplasm of upper lobe, right bronchus or lung: Secondary | ICD-10-CM | POA: Diagnosis present

## 2024-05-04 MED ORDER — IOHEXOL 300 MG/ML  SOLN
100.0000 mL | Freq: Once | INTRAMUSCULAR | Status: AC | PRN
  Administered 2024-05-04: 80 mL via INTRAVENOUS

## 2024-05-08 ENCOUNTER — Encounter (HOSPITAL_COMMUNITY): Payer: Self-pay

## 2024-05-08 ENCOUNTER — Telehealth: Payer: Self-pay | Admitting: Radiation Oncology

## 2024-05-08 DIAGNOSIS — C3490 Malignant neoplasm of unspecified part of unspecified bronchus or lung: Secondary | ICD-10-CM

## 2024-05-08 NOTE — Telephone Encounter (Signed)
 LM to share MRI results and recommendations to repeat in 6-8 weeks to ensure new focal findings are subacute vascular changes as previously noted in the brain.

## 2024-05-08 NOTE — Telephone Encounter (Signed)
 Pharmacy Patient Advocate Encounter  Received notification from [facility] that Prior Authorization for Oxycodone  ER 10MG  has been APPROVED from 04/20/24 to 04/19/25 Pt has already filled med.    PA #/Case ID/Reference #: PA Case ID #: [id number]

## 2024-05-09 ENCOUNTER — Inpatient Hospital Stay (HOSPITAL_COMMUNITY)
Admission: EM | Admit: 2024-05-09 | Discharge: 2024-06-05 | DRG: 208 | Disposition: E | Attending: Pulmonary Disease | Admitting: Pulmonary Disease

## 2024-05-09 ENCOUNTER — Other Ambulatory Visit: Payer: Self-pay

## 2024-05-09 ENCOUNTER — Encounter (HOSPITAL_COMMUNITY): Payer: Self-pay

## 2024-05-09 ENCOUNTER — Other Ambulatory Visit (HOSPITAL_COMMUNITY): Payer: Self-pay

## 2024-05-09 ENCOUNTER — Other Ambulatory Visit: Payer: Self-pay | Admitting: Radiation Therapy

## 2024-05-09 ENCOUNTER — Emergency Department (HOSPITAL_COMMUNITY)

## 2024-05-09 ENCOUNTER — Inpatient Hospital Stay

## 2024-05-09 DIAGNOSIS — I2609 Other pulmonary embolism with acute cor pulmonale: Secondary | ICD-10-CM | POA: Diagnosis not present

## 2024-05-09 DIAGNOSIS — D65 Disseminated intravascular coagulation [defibrination syndrome]: Secondary | ICD-10-CM | POA: Diagnosis not present

## 2024-05-09 DIAGNOSIS — Z66 Do not resuscitate: Secondary | ICD-10-CM | POA: Insufficient documentation

## 2024-05-09 DIAGNOSIS — Z79891 Long term (current) use of opiate analgesic: Secondary | ICD-10-CM

## 2024-05-09 DIAGNOSIS — M1990 Unspecified osteoarthritis, unspecified site: Secondary | ICD-10-CM | POA: Diagnosis present

## 2024-05-09 DIAGNOSIS — Z8546 Personal history of malignant neoplasm of prostate: Secondary | ICD-10-CM

## 2024-05-09 DIAGNOSIS — R0602 Shortness of breath: Principal | ICD-10-CM

## 2024-05-09 DIAGNOSIS — I82413 Acute embolism and thrombosis of femoral vein, bilateral: Secondary | ICD-10-CM | POA: Diagnosis present

## 2024-05-09 DIAGNOSIS — Z79899 Other long term (current) drug therapy: Secondary | ICD-10-CM

## 2024-05-09 DIAGNOSIS — C3491 Malignant neoplasm of unspecified part of right bronchus or lung: Secondary | ICD-10-CM | POA: Diagnosis present

## 2024-05-09 DIAGNOSIS — L8990 Pressure ulcer of unspecified site, unspecified stage: Secondary | ICD-10-CM | POA: Diagnosis present

## 2024-05-09 DIAGNOSIS — Z808 Family history of malignant neoplasm of other organs or systems: Secondary | ICD-10-CM

## 2024-05-09 DIAGNOSIS — C771 Secondary and unspecified malignant neoplasm of intrathoracic lymph nodes: Secondary | ICD-10-CM | POA: Diagnosis present

## 2024-05-09 DIAGNOSIS — J439 Emphysema, unspecified: Secondary | ICD-10-CM | POA: Diagnosis present

## 2024-05-09 DIAGNOSIS — R54 Age-related physical debility: Secondary | ICD-10-CM | POA: Diagnosis present

## 2024-05-09 DIAGNOSIS — E43 Unspecified severe protein-calorie malnutrition: Secondary | ICD-10-CM | POA: Diagnosis present

## 2024-05-09 DIAGNOSIS — I50813 Acute on chronic right heart failure: Secondary | ICD-10-CM | POA: Diagnosis present

## 2024-05-09 DIAGNOSIS — Z923 Personal history of irradiation: Secondary | ICD-10-CM

## 2024-05-09 DIAGNOSIS — C3411 Malignant neoplasm of upper lobe, right bronchus or lung: Secondary | ICD-10-CM | POA: Diagnosis present

## 2024-05-09 DIAGNOSIS — R578 Other shock: Secondary | ICD-10-CM | POA: Diagnosis not present

## 2024-05-09 DIAGNOSIS — R710 Precipitous drop in hematocrit: Secondary | ICD-10-CM | POA: Diagnosis not present

## 2024-05-09 DIAGNOSIS — Z7901 Long term (current) use of anticoagulants: Secondary | ICD-10-CM

## 2024-05-09 DIAGNOSIS — R0902 Hypoxemia: Secondary | ICD-10-CM

## 2024-05-09 DIAGNOSIS — C787 Secondary malignant neoplasm of liver and intrahepatic bile duct: Secondary | ICD-10-CM | POA: Diagnosis present

## 2024-05-09 DIAGNOSIS — Z681 Body mass index (BMI) 19 or less, adult: Secondary | ICD-10-CM

## 2024-05-09 DIAGNOSIS — R570 Cardiogenic shock: Secondary | ICD-10-CM | POA: Diagnosis not present

## 2024-05-09 DIAGNOSIS — N179 Acute kidney failure, unspecified: Secondary | ICD-10-CM | POA: Diagnosis not present

## 2024-05-09 DIAGNOSIS — K7290 Hepatic failure, unspecified without coma: Secondary | ICD-10-CM | POA: Diagnosis not present

## 2024-05-09 DIAGNOSIS — R64 Cachexia: Secondary | ICD-10-CM | POA: Diagnosis present

## 2024-05-09 DIAGNOSIS — F419 Anxiety disorder, unspecified: Secondary | ICD-10-CM | POA: Diagnosis present

## 2024-05-09 DIAGNOSIS — I2781 Cor pulmonale (chronic): Secondary | ICD-10-CM | POA: Diagnosis present

## 2024-05-09 DIAGNOSIS — K7200 Acute and subacute hepatic failure without coma: Secondary | ICD-10-CM | POA: Diagnosis not present

## 2024-05-09 DIAGNOSIS — J159 Unspecified bacterial pneumonia: Secondary | ICD-10-CM | POA: Diagnosis present

## 2024-05-09 DIAGNOSIS — Z8719 Personal history of other diseases of the digestive system: Secondary | ICD-10-CM

## 2024-05-09 DIAGNOSIS — Z809 Family history of malignant neoplasm, unspecified: Secondary | ICD-10-CM

## 2024-05-09 DIAGNOSIS — Z8249 Family history of ischemic heart disease and other diseases of the circulatory system: Secondary | ICD-10-CM

## 2024-05-09 DIAGNOSIS — E872 Acidosis, unspecified: Secondary | ICD-10-CM | POA: Diagnosis present

## 2024-05-09 DIAGNOSIS — Z1152 Encounter for screening for COVID-19: Secondary | ICD-10-CM

## 2024-05-09 DIAGNOSIS — Z87891 Personal history of nicotine dependence: Secondary | ICD-10-CM

## 2024-05-09 DIAGNOSIS — D649 Anemia, unspecified: Secondary | ICD-10-CM

## 2024-05-09 DIAGNOSIS — Z832 Family history of diseases of the blood and blood-forming organs and certain disorders involving the immune mechanism: Secondary | ICD-10-CM

## 2024-05-09 DIAGNOSIS — C7951 Secondary malignant neoplasm of bone: Secondary | ICD-10-CM | POA: Diagnosis present

## 2024-05-09 DIAGNOSIS — Z515 Encounter for palliative care: Secondary | ICD-10-CM

## 2024-05-09 DIAGNOSIS — I2699 Other pulmonary embolism without acute cor pulmonale: Secondary | ICD-10-CM | POA: Diagnosis present

## 2024-05-09 DIAGNOSIS — J9601 Acute respiratory failure with hypoxia: Secondary | ICD-10-CM | POA: Diagnosis present

## 2024-05-09 DIAGNOSIS — I2721 Secondary pulmonary arterial hypertension: Secondary | ICD-10-CM | POA: Diagnosis present

## 2024-05-09 DIAGNOSIS — Z811 Family history of alcohol abuse and dependence: Secondary | ICD-10-CM

## 2024-05-09 DIAGNOSIS — R34 Anuria and oliguria: Secondary | ICD-10-CM | POA: Diagnosis not present

## 2024-05-09 DIAGNOSIS — K5903 Drug induced constipation: Secondary | ICD-10-CM | POA: Diagnosis present

## 2024-05-09 LAB — CBC WITH DIFFERENTIAL/PLATELET
Abs Immature Granulocytes: 0.14 K/uL — ABNORMAL HIGH (ref 0.00–0.07)
Basophils Absolute: 0 K/uL (ref 0.0–0.1)
Basophils Relative: 0 %
Eosinophils Absolute: 0 K/uL (ref 0.0–0.5)
Eosinophils Relative: 0 %
HCT: 27.2 % — ABNORMAL LOW (ref 39.0–52.0)
Hemoglobin: 7.8 g/dL — ABNORMAL LOW (ref 13.0–17.0)
Immature Granulocytes: 1 %
Lymphocytes Relative: 5 %
Lymphs Abs: 0.8 K/uL (ref 0.7–4.0)
MCH: 25.6 pg — ABNORMAL LOW (ref 26.0–34.0)
MCHC: 28.7 g/dL — ABNORMAL LOW (ref 30.0–36.0)
MCV: 89.2 fL (ref 80.0–100.0)
Monocytes Absolute: 0.9 K/uL (ref 0.1–1.0)
Monocytes Relative: 6 %
Neutro Abs: 14.9 K/uL — ABNORMAL HIGH (ref 1.7–7.7)
Neutrophils Relative %: 88 %
Platelets: 326 K/uL (ref 150–400)
RBC: 3.05 MIL/uL — ABNORMAL LOW (ref 4.22–5.81)
RDW: 18.8 % — ABNORMAL HIGH (ref 11.5–15.5)
WBC: 16.8 K/uL — ABNORMAL HIGH (ref 4.0–10.5)
nRBC: 0 % (ref 0.0–0.2)

## 2024-05-09 LAB — BASIC METABOLIC PANEL WITH GFR
Anion gap: 17 — ABNORMAL HIGH (ref 5–15)
BUN: 12 mg/dL (ref 8–23)
CO2: 20 mmol/L — ABNORMAL LOW (ref 22–32)
Calcium: 8.4 mg/dL — ABNORMAL LOW (ref 8.9–10.3)
Chloride: 101 mmol/L (ref 98–111)
Creatinine, Ser: 0.64 mg/dL (ref 0.61–1.24)
GFR, Estimated: >60 mL/min (ref >60)
Glucose, Bld: 130 mg/dL — ABNORMAL HIGH (ref 70–99)
Potassium: 3.7 mmol/L (ref 3.5–5.1)
Sodium: 138 mmol/L (ref 135–145)

## 2024-05-09 LAB — TROPONIN I (HIGH SENSITIVITY): Troponin I (High Sensitivity): 233 ng/L (ref <18)

## 2024-05-09 LAB — BRAIN NATRIURETIC PEPTIDE: B Natriuretic Peptide: 133.8 pg/mL — ABNORMAL HIGH (ref 0.0–100.0)

## 2024-05-09 MED ORDER — FENTANYL CITRATE PF 50 MCG/ML IJ SOSY
100.0000 ug | PREFILLED_SYRINGE | Freq: Once | INTRAMUSCULAR | Status: AC
  Administered 2024-05-09: 100 ug via INTRAVENOUS
  Filled 2024-05-09: qty 2

## 2024-05-09 MED ORDER — HEPARIN BOLUS VIA INFUSION
2000.0000 [IU] | Freq: Once | INTRAVENOUS | Status: AC
  Administered 2024-05-09: 2000 [IU] via INTRAVENOUS
  Filled 2024-05-09: qty 2000

## 2024-05-09 MED ORDER — MIDAZOLAM HCL 2 MG/2ML IJ SOLN
2.0000 mg | Freq: Once | INTRAMUSCULAR | Status: AC
  Administered 2024-05-09: 2 mg via INTRAVENOUS
  Filled 2024-05-09: qty 2

## 2024-05-09 MED ORDER — HEPARIN (PORCINE) 25000 UT/250ML-% IV SOLN
1300.0000 [IU]/h | INTRAVENOUS | Status: DC
  Administered 2024-05-09: 1000 [IU]/h via INTRAVENOUS
  Filled 2024-05-09: qty 250

## 2024-05-09 MED ORDER — SODIUM CHLORIDE 0.9 % IV SOLN
1.0000 g | Freq: Once | INTRAVENOUS | Status: AC
  Administered 2024-05-09: 1 g via INTRAVENOUS
  Filled 2024-05-09: qty 10

## 2024-05-09 MED ORDER — SODIUM CHLORIDE 0.9 % IV SOLN
100.0000 mg | Freq: Once | INTRAVENOUS | Status: AC
  Administered 2024-05-10: 100 mg via INTRAVENOUS
  Filled 2024-05-09: qty 100

## 2024-05-09 NOTE — ED Triage Notes (Signed)
 Pt is alert and oriented x 4, complaining of shortness of breath. Did take an breathing treatment at home and ems gave a duoneb enroute. Is on 10 liters NRB with oxygen saturation in the 90s. Hx of lung cancer diagnosed last may

## 2024-05-09 NOTE — ED Notes (Signed)
 Pt family member came out the room and stated he needs a urinal he has to use the bathroom. This tech went to get a urinal and took it in the room. This tech noticed the family member had assisted the pt to end of the bed, and pt was having issues breathing. The family member yelled GET SOMEONE IN HERE NOW. This tech went an got a Engineer, civil (consulting). The nurse came in an noticed pt didn't have on his oxygen. The oxygen was placed back on and the nurse informed the pt he has to keep it on. The pt family member mentioned the pt was hot, this tech attempted to take the pt temp orally, but the family stated DO IT UNDER HIS ARM. The pt asked for something to drink, this tech stepped out to get some water for the pt. This tech also grabbed supplies to do a condom cath to place on the pt to make things easier for the pt. Once this tech stepped in the door with everything the nurse was still doing her job with the pt, before this tech could say anything the pt family member asked, What are you doing, what are you doing? This tech replied I'm going to do a condom cath on him so he doesn't have to move as much. The pt family member stated ok that's good, the family member also told the pt. Once the nurse was done with what she had to do and left. This tech removed excess items from the bed and then explained to the pt he was going to be pulled back into the bed. This tech grabbed under pt arm and the back of pt pants, then asked are you ready, 1,2,3 and the pt was pulled back. This tech then lifted the pt leg to remove the blanket that was wrapped around his leg. The family member stated he's not going to be able to lay back in order for you to do that. Before this tech could explain what was going to happen the family member stated HE DOESN'T NEED THAT DONE, TELL THEM I DON'T WANT THAT DONE, YOU DON'T JUST START DOING THINGS WITHOUT EXPLAINING IT TO THE PT. This tech stated I haven't done anything yet except remove the  tangled blanket, the nurse just finished doing what she had to do, you have not given me anytime to explain anything to the pt or you. This tech then left the room.

## 2024-05-09 NOTE — ED Provider Notes (Signed)
  EMERGENCY DEPARTMENT AT [facility] Provider Note   CSN: [id number] Arrival date & time: 05/09/24  2059     Patient presents with: Shortness of Breath   Jeff Romero is a 63 y.o. male with a history of stage IV lung cancer with mets to bone and lymph nodes, PE, on Lovenox  BID, right external jugular vein thrombus, here with shortness of breath.  His wife reports this was acute onset this evening, patient woke up from a nap and was gasping for breath.  Echo 5/11 showed EF 40-45%.  His wife feels his legs have swollen.    HPI     Prior to Admission medications   Medication Sig Start Date End Date Taking? Authorizing Provider  acetaminophen  (TYLENOL ) 500 MG tablet Take 2 tablets (1,000 mg total) by mouth every 8 (eight) hours. 02/15/24   Ghimire, Donalda HERO, MD  albuterol  (VENTOLIN  HFA) 108 (90 Base) MCG/ACT inhaler Inhale 1 puff into the lungs every 6 (six) hours as needed for wheezing or shortness of breath. 12/23/23   [provider]  benzonatate  (TESSALON ) 200 MG capsule Take 1 capsule (200 mg total) by mouth 3 (three) times daily. 03/28/24   Pickenpack-Cousar, Fannie SAILOR, NP  enoxaparin  (LOVENOX ) 60 MG/0.6ML injection Inject 0.6 mLs (60 mg total) into the skin 2 (two) times daily. 04/25/24 07/24/24  Heilingoetter, Cassandra L, PA-C  ferrous sulfate 325 (65 FE) MG tablet Take 325 mg by mouth daily with breakfast.    [provider]  gabapentin  (NEURONTIN ) 100 MG capsule Take 1 capsule (100 mg total) by mouth 3 (three) times daily. 05/02/24   Pickenpack-Cousar, Athena N, NP  guaiFENesin -codeine  100-10 MG/5ML syrup Take 10 mLs by mouth every 4 (four) hours as needed for cough. 04/15/24   Will Almarie MATSU, MD  HYDROcodone  bit-homatropine [facility]) 5-1.5 MG/5ML syrup Take 5 mLs by mouth every 8 (eight) hours as needed for cough. 03/28/24   Pickenpack-Cousar, Athena N, NP  HYDROmorphone  (DILAUDID ) 2 MG tablet Take 0.5 tablets (1 mg total) by mouth every 4  (four) hours as needed for severe pain (pain score 7-10). Patient taking differently: Take 1 mg by mouth in the morning, at noon, in the evening, and at bedtime. 04/20/24   Pickenpack-Cousar, Athena N, NP  megestrol  (MEGACE ) 20 MG tablet Take 40 mg by mouth 4 (four) times daily -  with meals and at bedtime. 02/21/24   [provider]  metoprolol  succinate (TOPROL  XL) 25 MG 24 hr tablet Take 0.5 tablets (12.5 mg total) by mouth daily. 04/26/24   Vicci Rollo SAUNDERS, PA-C  ondansetron  (ZOFRAN ) 8 MG tablet Take 0.5 tablets (4 mg total) by mouth every 8 (eight) hours as needed for nausea. 03/28/24   Pickenpack-Cousar, Fannie SAILOR, NP  OVER THE COUNTER MEDICATION Apply 1 Application topically daily. Topical cream with unknown active ingredient/strength. Sample given by oncologist in office for daily use on radiation burn.    [provider]  oxyCODONE  (OXYCONTIN ) 10 mg 12 hr tablet Take 1 tablet (10 mg total) by mouth every 8 (eight) hours. 04/20/24   Pickenpack-Cousar, Fannie SAILOR, NP  pantoprazole  (PROTONIX ) 40 MG tablet Take 1 tablet (40 mg total) by mouth 2 (two) times daily. 04/15/24   Will Almarie MATSU, MD  pembrolizumab  (KEYTRUDA ) 100 MG/4ML SOLN Inject 200 mg into the vein every 21 ( twenty-one) days.    [provider]  polyethylene glycol powder (GLYCOLAX /MIRALAX ) 17 GM/SCOOP powder Take 17 g by mouth daily. 02/15/24   Ghimire, Donalda HERO,  MD  senna-docusate (SENOKOT-S) 8.6-50 MG tablet Take 2 tablets by mouth at bedtime. 02/15/24   Ghimire, Donalda HERO, MD  spironolactone  (ALDACTONE ) 25 MG tablet Take 0.5 tablets (12.5 mg total) by mouth daily. 04/26/24 07/25/24  Vicci Rollo SAUNDERS, PA-C    Allergies: Patient has no known allergies.    Review of Systems  Updated Vital Signs BP 125/89 (BP Location: Right Arm)   Pulse (!) 151   Temp 99 F (37.2 C) (Axillary)   Resp (!) 29   Ht 5' 7 (1.702 m)   Wt 54.9 kg   SpO2 96%   BMI 18.95 kg/m   Physical Exam Constitutional:       Comments: Thin, frail, cachectic appearing  HENT:     Head: Normocephalic and atraumatic.  Eyes:     Conjunctiva/sclera: Conjunctivae normal.     Pupils: Pupils are equal, round, and reactive to light.  Cardiovascular:     Rate and Rhythm: Regular rhythm. Tachycardia present.  Pulmonary:     Effort: Pulmonary effort is normal. No respiratory distress.     Comments: 94% on 8L [place] Rhonchi bilaterally, RR 34, retractions accessory muscles with WOB Patient able to speak in short sentences Abdominal:     General: There is no distension.     Tenderness: There is no abdominal tenderness.  Musculoskeletal:     Right lower leg: Edema present.     Left lower leg: Edema present.  Skin:    General: Skin is warm and dry.  Neurological:     General: No focal deficit present.     Mental Status: He is alert. Mental status is at baseline.  Psychiatric:        Mood and Affect: Mood normal.        Behavior: Behavior normal.     (all labs ordered are listed, but only abnormal results are displayed) Labs Reviewed  BASIC METABOLIC PANEL WITH GFR - Abnormal; Notable for the following components:      Result Value   CO2 20 (*)    Glucose, Bld 130 (*)    Calcium  8.4 (*)    Anion gap 17 (*)    All other components within normal limits  CBC WITH DIFFERENTIAL/PLATELET - Abnormal; Notable for the following components:   WBC 16.8 (*)    RBC 3.05 (*)    Hemoglobin 7.8 (*)    HCT 27.2 (*)    MCH 25.6 (*)    MCHC 28.7 (*)    RDW 18.8 (*)    Neutro Abs 14.9 (*)    Abs Immature Granulocytes 0.14 (*)    All other components within normal limits  BRAIN NATRIURETIC PEPTIDE - Abnormal; Notable for the following components:   B Natriuretic Peptide 133.8 (*)    All other components within normal limits  TROPONIN I (HIGH SENSITIVITY) - Abnormal; Notable for the following components:   Troponin I (High Sensitivity) 233 (*)    All other components within normal limits  BLOOD GAS, VENOUS  HEPARIN  LEVEL  (UNFRACTIONATED)  APTT  TROPONIN I (HIGH SENSITIVITY)    EKG: None  Radiology: DG Chest Portable 1 View Result Date: 05/09/2024 CLINICAL DATA:  shortness of breath.  Lung cancer EXAM: PORTABLE CHEST 1 VIEW COMPARISON:  CT chest abdomen pelvis 05/04/2024 FINDINGS: The heart and mediastinal contours are within normal limits. Right upper lobe complete opacification with apical bullous changes consistent with known underlying malignancy. Left apical bullous changes. No pulmonary edema. Persistent trace to small right pleural effusion.  No pneumothorax. No acute osseous abnormality. IMPRESSION: 1. Right upper lobe complete opacification with apical bullous changes consistent with known underlying malignancy. Associated postobstructive atelectasis with possibly superimposed infection. Finding better evaluated on CT chest 05/04/2024. 2. Persistent trace to small right pleural effusion 3. No acute cardiopulmonary abnormality. Electronically Signed   By: Morgane  Naveau M.D.   On: 05/09/2024 22:10     .Critical Care  Performed by: Cottie Donnice PARAS, MD Authorized by: Cottie Donnice PARAS, MD   Critical care provider statement:    Critical care time (minutes):  40   Critical care time was exclusive of:  Separately billable procedures and treating other patients   Critical care was necessary to treat or prevent imminent or life-threatening deterioration of the following conditions:  Respiratory failure   Critical care was time spent personally by me on the following activities:  Ordering and performing treatments and interventions, ordering and review of laboratory studies, ordering and review of radiographic studies, pulse oximetry, review of old charts, examination of patient and evaluation of patient's response to treatment    Medications Ordered in the ED  cefTRIAXone  (ROCEPHIN ) 1 g in sodium chloride  0.9 % 100 mL IVPB (1 g Intravenous New Bag/Given 05/09/24 2312)  doxycycline  (VIBRAMYCIN ) 100 mg in  sodium chloride  0.9 % 250 mL IVPB (has no administration in time range)  midazolam  (VERSED ) injection 2 mg (2 mg Intravenous Given 05/09/24 2225)  fentaNYL  (SUBLIMAZE ) injection 100 mcg (100 mcg Intravenous Given 05/09/24 2308)    Clinical Course as of 05/09/24 2331  Tue May 09, 2024  2157 Prior ecg's reviewed the past 2 months - pt has known hx of sinus tachycardia with HR in 130's in the recent past.  I suspect this is worsening of sinus tachycardia, HR 150's here.  Less likely acute A Fib [MT]  2222 Neutrophils: 88 Pt still reporting subjective dyspnea and has tachypnea, labored breathing despite supplemental [place] O2 at 8L (satting 94%).  RR 34 bpm.  We'll start him on bipap for increased WOB.  His wife at bedside is an RT and in agreement but requests an anxiolytic - I've ordered 2mg  IV versed .  I've also ordered CT PE study given differential for his dyspnea which remains broad.  With WBC elevation and aspiration risk, I've ordered antibiotics for PNA [MT]  2238 Troponin has some elevation which may likely be demand ischemia from a secondary source - cannot exclude possibility of new PE.  Less likely ACS given this started at rest and no persistent chest pressure or pain.  He's had problems with anemia in the past, with hgb 7.8 - I think we need to defer on heparin  until we have confirmed imaging to make a risk and benefit discussion.  He did get his lovenox  today at home from his wife. [MT]  2307 Pt had difficulty tolerating Bipap due to pain and anxiety but now able to wear it after fentanyl .  WOB is improving. 98% O2 sat on bipap.  I've consulted the ICU attending and agreed to initiate heparin  for likely PE.   [MT]  2326 Signed out to Dr Albertina at bedside, stable on bipap - pending CT PE and ICU admit.  BP remains normal in the ED. [MT]    Clinical Course User Index [MT] Jimmie Dattilio, Donnice PARAS, MD                                 Medical  Decision Making Amount and/or Complexity of Data  Reviewed Labs: ordered. Decision-making details documented in ED Course. Radiology: ordered. ECG/medicine tests: ordered.  Risk Prescription drug management. Decision regarding hospitalization.   This patient presents to the ED with concern for acute worsening shortness of breath. This involves an extensive number of treatment options, and is a complaint that carries with it a high risk of complications and morbidity.  The differential diagnosis includes CHF vs pleural effusion vs anemia vs PNA vs PTX vs PE vs other  Co-morbidities that complicate the patient evaluation: pulm effusion and malignancy, thrombus; at risk of PE/pulm overload  Additional history obtained from patient's wife  External records from outside source obtained and reviewed including recent hospitalization course, hx of PE, echocardiogram  I ordered and personally interpreted labs.  The pertinent results include:  WBC 16.8, hgb 7.8, Cr 0.64, Trop 233 (repeat pending), BNP 133.  I ordered imaging studies including dg chest, CT PE I independently visualized and interpreted imaging which showed - pleural effusion, malignancy - CT PE study pending at the time of signout I agree with the radiologist interpretation  The patient was maintained on a cardiac monitor.  I personally viewed and interpreted the cardiac monitored which showed an underlying rhythm of: sinus tachycardia  Per my interpretation the patient's ECG shows sinus tachycardia  I ordered medication including IV antibiotics for possible aspiration PNA, bipap and versed  for labored breathing  I have reviewed the patients home medicines and have made adjustments as needed  After the interventions noted above, I reevaluated the patient and found that they have: improved  Disposition:  After consideration of the diagnostic results and the patient's response to treatment, I feel that the patient would benefit from medical admission      Final diagnoses:   Shortness of breath  Hypoxia  Anemia, unspecified type    ED Discharge Orders     None          Cottie Donnice PARAS, MD 05/09/24 [phone]

## 2024-05-09 NOTE — Progress Notes (Signed)
 PHARMACY - ANTICOAGULATION CONSULT NOTE  Pharmacy Consult for heparin  Indication: pulmonary embolus  No Known Allergies  Patient Measurements: Height: 5' 7 (170.2 cm) Weight: 54.9 kg (121 lb) IBW/kg (Calculated) : 66.1 HEPARIN  DW (KG): 54.9  Vital Signs: Temp: 99 F (37.2 C) (08/05 2209) Temp Source: Axillary (08/05 2209) BP: 125/89 (08/05 2128) Pulse Rate: 151 (08/05 2128)  Labs: Recent Labs    05/09/24 2145  HGB 7.8*  HCT 27.2*  PLT 326  CREATININE 0.64  TROPONINIHS 233*    Estimated Creatinine Clearance: 73.4 mL/min (by C-G formula based on SCr of 0.64 mg/dL).   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  Pharmacy to dose heparin , pt on lovenox  BID, LD 8/5 @ 0800  Hgb 7.8, plts 326, trop 233  Goal of Therapy:  Heparin  level 0.3-0.7 units/ml aPTT 66-102 seconds Monitor platelets by anticoagulation protocol: Yes   Plan:  Baseline labs ordered STAT Heparin  bolus 2000 units x 1 then start Heparin  drip 1000 units/hr Heparin  level in 6 hours Daily CBC   Leeroy Mace RPh 05/09/2024, 11:30 PM

## 2024-05-10 ENCOUNTER — Other Ambulatory Visit

## 2024-05-10 ENCOUNTER — Encounter (HOSPITAL_COMMUNITY): Payer: Self-pay

## 2024-05-10 ENCOUNTER — Inpatient Hospital Stay (HOSPITAL_COMMUNITY)

## 2024-05-10 ENCOUNTER — Emergency Department (HOSPITAL_COMMUNITY)

## 2024-05-10 ENCOUNTER — Inpatient Hospital Stay: Payer: Self-pay

## 2024-05-10 ENCOUNTER — Other Ambulatory Visit (HOSPITAL_COMMUNITY)

## 2024-05-10 DIAGNOSIS — Z7189 Other specified counseling: Secondary | ICD-10-CM | POA: Diagnosis not present

## 2024-05-10 DIAGNOSIS — I50813 Acute on chronic right heart failure: Secondary | ICD-10-CM | POA: Diagnosis present

## 2024-05-10 DIAGNOSIS — J9601 Acute respiratory failure with hypoxia: Secondary | ICD-10-CM | POA: Diagnosis present

## 2024-05-10 DIAGNOSIS — R579 Shock, unspecified: Secondary | ICD-10-CM | POA: Diagnosis not present

## 2024-05-10 DIAGNOSIS — Z681 Body mass index (BMI) 19 or less, adult: Secondary | ICD-10-CM | POA: Diagnosis not present

## 2024-05-10 DIAGNOSIS — Z1152 Encounter for screening for COVID-19: Secondary | ICD-10-CM | POA: Diagnosis not present

## 2024-05-10 DIAGNOSIS — D638 Anemia in other chronic diseases classified elsewhere: Secondary | ICD-10-CM | POA: Diagnosis not present

## 2024-05-10 DIAGNOSIS — C3411 Malignant neoplasm of upper lobe, right bronchus or lung: Secondary | ICD-10-CM

## 2024-05-10 DIAGNOSIS — J439 Emphysema, unspecified: Secondary | ICD-10-CM | POA: Diagnosis present

## 2024-05-10 DIAGNOSIS — Z515 Encounter for palliative care: Secondary | ICD-10-CM | POA: Diagnosis not present

## 2024-05-10 DIAGNOSIS — R0609 Other forms of dyspnea: Secondary | ICD-10-CM

## 2024-05-10 DIAGNOSIS — I509 Heart failure, unspecified: Secondary | ICD-10-CM | POA: Diagnosis not present

## 2024-05-10 DIAGNOSIS — D65 Disseminated intravascular coagulation [defibrination syndrome]: Secondary | ICD-10-CM | POA: Diagnosis not present

## 2024-05-10 DIAGNOSIS — J159 Unspecified bacterial pneumonia: Secondary | ICD-10-CM | POA: Diagnosis present

## 2024-05-10 DIAGNOSIS — E872 Acidosis, unspecified: Secondary | ICD-10-CM | POA: Diagnosis present

## 2024-05-10 DIAGNOSIS — I2721 Secondary pulmonary arterial hypertension: Secondary | ICD-10-CM | POA: Diagnosis present

## 2024-05-10 DIAGNOSIS — I82413 Acute embolism and thrombosis of femoral vein, bilateral: Secondary | ICD-10-CM | POA: Diagnosis present

## 2024-05-10 DIAGNOSIS — Z66 Do not resuscitate: Secondary | ICD-10-CM | POA: Diagnosis not present

## 2024-05-10 DIAGNOSIS — R34 Anuria and oliguria: Secondary | ICD-10-CM | POA: Diagnosis not present

## 2024-05-10 DIAGNOSIS — Z86711 Personal history of pulmonary embolism: Secondary | ICD-10-CM | POA: Diagnosis not present

## 2024-05-10 DIAGNOSIS — E43 Unspecified severe protein-calorie malnutrition: Secondary | ICD-10-CM | POA: Diagnosis present

## 2024-05-10 DIAGNOSIS — N179 Acute kidney failure, unspecified: Secondary | ICD-10-CM | POA: Diagnosis not present

## 2024-05-10 DIAGNOSIS — R570 Cardiogenic shock: Secondary | ICD-10-CM | POA: Diagnosis not present

## 2024-05-10 DIAGNOSIS — I2699 Other pulmonary embolism without acute cor pulmonale: Secondary | ICD-10-CM | POA: Diagnosis not present

## 2024-05-10 DIAGNOSIS — C771 Secondary and unspecified malignant neoplasm of intrathoracic lymph nodes: Secondary | ICD-10-CM | POA: Diagnosis present

## 2024-05-10 DIAGNOSIS — E8729 Other acidosis: Secondary | ICD-10-CM | POA: Diagnosis not present

## 2024-05-10 DIAGNOSIS — R531 Weakness: Secondary | ICD-10-CM | POA: Diagnosis not present

## 2024-05-10 DIAGNOSIS — K7200 Acute and subacute hepatic failure without coma: Secondary | ICD-10-CM | POA: Diagnosis not present

## 2024-05-10 DIAGNOSIS — R64 Cachexia: Secondary | ICD-10-CM | POA: Diagnosis present

## 2024-05-10 DIAGNOSIS — R710 Precipitous drop in hematocrit: Secondary | ICD-10-CM | POA: Diagnosis not present

## 2024-05-10 DIAGNOSIS — C7951 Secondary malignant neoplasm of bone: Secondary | ICD-10-CM | POA: Diagnosis present

## 2024-05-10 DIAGNOSIS — C787 Secondary malignant neoplasm of liver and intrahepatic bile duct: Secondary | ICD-10-CM | POA: Diagnosis present

## 2024-05-10 DIAGNOSIS — R0602 Shortness of breath: Secondary | ICD-10-CM | POA: Diagnosis present

## 2024-05-10 DIAGNOSIS — I2609 Other pulmonary embolism with acute cor pulmonale: Secondary | ICD-10-CM | POA: Diagnosis present

## 2024-05-10 HISTORY — PX: IR ANGIOGRAM PULMONARY NONSELECTIVE CATHETER OR VENOUS INJECTION: IMG665

## 2024-05-10 LAB — CBC
HCT: 19.2 % — ABNORMAL LOW (ref 39.0–52.0)
HCT: 25.8 % — ABNORMAL LOW (ref 39.0–52.0)
HCT: 20.4 % — ABNORMAL LOW (ref 39.0–52.0)
HCT: 18.4 % — ABNORMAL LOW (ref 39.0–52.0)
Hemoglobin: 5.2 g/dL — CL (ref 13.0–17.0)
Hemoglobin: 7.2 g/dL — ABNORMAL LOW (ref 13.0–17.0)
Hemoglobin: 5.5 g/dL — CL (ref 13.0–17.0)
Hemoglobin: 5.2 g/dL — CL (ref 13.0–17.0)
MCH: 25 pg — ABNORMAL LOW (ref 26.0–34.0)
MCH: 26.6 pg (ref 26.0–34.0)
MCH: 26.3 pg (ref 26.0–34.0)
MCH: 26.4 pg (ref 26.0–34.0)
MCHC: 27.1 g/dL — ABNORMAL LOW (ref 30.0–36.0)
MCHC: 27.9 g/dL — ABNORMAL LOW (ref 30.0–36.0)
MCHC: 27 g/dL — ABNORMAL LOW (ref 30.0–36.0)
MCHC: 28.3 g/dL — ABNORMAL LOW (ref 30.0–36.0)
MCV: 92.3 fL (ref 80.0–100.0)
MCV: 95.2 fL (ref 80.0–100.0)
MCV: 97.6 fL (ref 80.0–100.0)
MCV: 93.4 fL (ref 80.0–100.0)
Platelets: 252 K/uL (ref 150–400)
Platelets: 156 K/uL (ref 150–400)
Platelets: 145 K/uL — ABNORMAL LOW (ref 150–400)
Platelets: 124 K/uL — ABNORMAL LOW (ref 150–400)
RBC: 2.08 MIL/uL — ABNORMAL LOW (ref 4.22–5.81)
RBC: 2.71 MIL/uL — ABNORMAL LOW (ref 4.22–5.81)
RBC: 2.09 MIL/uL — ABNORMAL LOW (ref 4.22–5.81)
RBC: 1.97 MIL/uL — ABNORMAL LOW (ref 4.22–5.81)
RDW: 18.9 % — ABNORMAL HIGH (ref 11.5–15.5)
RDW: 17.8 % — ABNORMAL HIGH (ref 11.5–15.5)
RDW: 17.9 % — ABNORMAL HIGH (ref 11.5–15.5)
RDW: 17.6 % — ABNORMAL HIGH (ref 11.5–15.5)
WBC: 15.1 K/uL — ABNORMAL HIGH (ref 4.0–10.5)
WBC: 12.3 K/uL — ABNORMAL HIGH (ref 4.0–10.5)
WBC: 14.4 K/uL — ABNORMAL HIGH (ref 4.0–10.5)
WBC: 12.5 K/uL — ABNORMAL HIGH (ref 4.0–10.5)
nRBC: 0 % (ref 0.0–0.2)
nRBC: 0.2 % (ref 0.0–0.2)
nRBC: 0.2 % (ref 0.0–0.2)
nRBC: 0.5 % — ABNORMAL HIGH (ref 0.0–0.2)

## 2024-05-10 LAB — POCT I-STAT 7, (LYTES, BLD GAS, ICA,H+H)
Acid-base deficit: 12 mmol/L — ABNORMAL HIGH (ref 0.0–2.0)
Bicarbonate: 11.5 mmol/L — ABNORMAL LOW (ref 20.0–28.0)
Calcium, Ion: 1.14 mmol/L — ABNORMAL LOW (ref 1.15–1.40)
HCT: 16 % — ABNORMAL LOW (ref 39.0–52.0)
Hemoglobin: 5.4 g/dL — CL (ref 13.0–17.0)
O2 Saturation: 97 %
Patient temperature: 97.7
Potassium: 4.5 mmol/L (ref 3.5–5.1)
Sodium: 136 mmol/L (ref 135–145)
TCO2: 12 mmol/L — ABNORMAL LOW (ref 22–32)
pCO2 arterial: 16.8 mmHg — CL (ref 32–48)
pH, Arterial: 7.439 (ref 7.35–7.45)
pO2, Arterial: 80 mmHg — ABNORMAL LOW (ref 83–108)

## 2024-05-10 LAB — DIC (DISSEMINATED INTRAVASCULAR COAGULATION)PANEL
D-Dimer, Quant: 18.36 ug{FEU}/mL — ABNORMAL HIGH (ref 0.00–0.50)
Fibrinogen: 612 mg/dL — ABNORMAL HIGH (ref 210–475)
INR: 1.8 — ABNORMAL HIGH (ref 0.8–1.2)
Platelets: 281 K/uL (ref 150–400)
Prothrombin Time: 21.4 s — ABNORMAL HIGH (ref 11.4–15.2)
Smear Review: NONE SEEN
aPTT: 74 s — ABNORMAL HIGH (ref 24–36)

## 2024-05-10 LAB — RESP PANEL BY RT-PCR (RSV, FLU A&B, COVID)  RVPGX2
Influenza A by PCR: NEGATIVE
Influenza B by PCR: NEGATIVE
Resp Syncytial Virus by PCR: NEGATIVE
SARS Coronavirus 2 by RT PCR: NEGATIVE

## 2024-05-10 LAB — COMPREHENSIVE METABOLIC PANEL WITH GFR
ALT: 265 U/L — ABNORMAL HIGH (ref 0–44)
AST: 1526 U/L — ABNORMAL HIGH (ref 15–41)
Albumin: <1.5 g/dL — ABNORMAL LOW (ref 3.5–5.0)
Alkaline Phosphatase: 152 U/L — ABNORMAL HIGH (ref 38–126)
Anion gap: 26 — ABNORMAL HIGH (ref 5–15)
BUN: 22 mg/dL (ref 8–23)
CO2: 10 mmol/L — ABNORMAL LOW (ref 22–32)
Calcium: 7.1 mg/dL — ABNORMAL LOW (ref 8.9–10.3)
Chloride: 97 mmol/L — ABNORMAL LOW (ref 98–111)
Creatinine, Ser: 1.18 mg/dL (ref 0.61–1.24)
GFR, Estimated: >60 mL/min (ref >60)
Glucose, Bld: 548 mg/dL (ref 70–99)
Potassium: 5 mmol/L (ref 3.5–5.1)
Sodium: 133 mmol/L — ABNORMAL LOW (ref 135–145)
Total Bilirubin: 1 mg/dL (ref 0.0–1.2)
Total Protein: 4.7 g/dL — ABNORMAL LOW (ref 6.5–8.1)

## 2024-05-10 LAB — ECHOCARDIOGRAM COMPLETE
Height: 67 in
S' Lateral: 1.2 cm
Weight: 1936 [oz_av]

## 2024-05-10 LAB — GLUCOSE, CAPILLARY
Glucose-Capillary: 104 mg/dL — ABNORMAL HIGH (ref 70–99)
Glucose-Capillary: 105 mg/dL — ABNORMAL HIGH (ref 70–99)
Glucose-Capillary: 106 mg/dL — ABNORMAL HIGH (ref 70–99)
Glucose-Capillary: 142 mg/dL — ABNORMAL HIGH (ref 70–99)
Glucose-Capillary: 142 mg/dL — ABNORMAL HIGH (ref 70–99)
Glucose-Capillary: 39 mg/dL — CL (ref 70–99)
Glucose-Capillary: 41 mg/dL — CL (ref 70–99)
Glucose-Capillary: 43 mg/dL — CL (ref 70–99)
Glucose-Capillary: 89 mg/dL (ref 70–99)
Glucose-Capillary: 91 mg/dL (ref 70–99)

## 2024-05-10 LAB — BASIC METABOLIC PANEL WITH GFR
Anion gap: 22 — ABNORMAL HIGH (ref 5–15)
BUN: 15 mg/dL (ref 8–23)
CO2: 17 mmol/L — ABNORMAL LOW (ref 22–32)
Calcium: 8.2 mg/dL — ABNORMAL LOW (ref 8.9–10.3)
Chloride: 101 mmol/L (ref 98–111)
Creatinine, Ser: 0.81 mg/dL (ref 0.61–1.24)
GFR, Estimated: >60 mL/min (ref >60)
Glucose, Bld: 104 mg/dL — ABNORMAL HIGH (ref 70–99)
Potassium: 4 mmol/L (ref 3.5–5.1)
Sodium: 140 mmol/L (ref 135–145)

## 2024-05-10 LAB — BLOOD GAS, VENOUS
Acid-base deficit: 4.6 mmol/L — ABNORMAL HIGH (ref 0.0–2.0)
Acid-base deficit: 7 mmol/L — ABNORMAL HIGH (ref 0.0–2.0)
Bicarbonate: 18.6 mmol/L — ABNORMAL LOW (ref 20.0–28.0)
Bicarbonate: 21.1 mmol/L (ref 20.0–28.0)
O2 Saturation: 29.8 %
O2 Saturation: 35 %
Patient temperature: 36.7
Patient temperature: 37
pCO2, Ven: 37 mmHg — ABNORMAL LOW (ref 44–60)
pCO2, Ven: 41 mmHg — ABNORMAL LOW (ref 44–60)
pH, Ven: 7.31 (ref 7.25–7.43)
pH, Ven: 7.32 (ref 7.25–7.43)
pO2, Ven: <31 mmHg — CL (ref 32–45)
pO2, Ven: <31 mmHg — CL (ref 32–45)

## 2024-05-10 LAB — PREPARE RBC (CROSSMATCH)

## 2024-05-10 LAB — TROPONIN I (HIGH SENSITIVITY)
Troponin I (High Sensitivity): 1054 ng/L (ref <18)
Troponin I (High Sensitivity): 823 ng/L (ref <18)
Troponin I (High Sensitivity): 845 ng/L (ref <18)
Troponin I (High Sensitivity): 984 ng/L (ref <18)

## 2024-05-10 LAB — PHOSPHORUS: Phosphorus: 3.7 mg/dL (ref 2.5–4.6)

## 2024-05-10 LAB — APTT: aPTT: 37 s — ABNORMAL HIGH (ref 24–36)

## 2024-05-10 LAB — POCT I-STAT, CHEM 8
BUN: 15 mg/dL (ref 8–23)
Calcium, Ion: 1.08 mmol/L — ABNORMAL LOW (ref 1.15–1.40)
Chloride: 105 mmol/L (ref 98–111)
Creatinine, Ser: 0.8 mg/dL (ref 0.61–1.24)
Glucose, Bld: 124 mg/dL — ABNORMAL HIGH (ref 70–99)
HCT: 21 % — ABNORMAL LOW (ref 39.0–52.0)
Hemoglobin: 7.1 g/dL — ABNORMAL LOW (ref 13.0–17.0)
Potassium: 4.9 mmol/L (ref 3.5–5.1)
Sodium: 137 mmol/L (ref 135–145)
TCO2: 12 mmol/L — ABNORMAL LOW (ref 22–32)

## 2024-05-10 LAB — LACTIC ACID, PLASMA
Lactic Acid, Venous: >9 mmol/L (ref 0.5–1.9)
Lactic Acid, Venous: >9 mmol/L (ref 0.5–1.9)

## 2024-05-10 LAB — HEPARIN LEVEL (UNFRACTIONATED)
Heparin Unfractionated: 0.15 [IU]/mL — ABNORMAL LOW (ref 0.30–0.70)
Heparin Unfractionated: <0.1 [IU]/mL — ABNORMAL LOW (ref 0.30–0.70)
Heparin Unfractionated: <0.1 [IU]/mL — ABNORMAL LOW (ref 0.30–0.70)

## 2024-05-10 LAB — MRSA NEXT GEN BY PCR, NASAL: MRSA by PCR Next Gen: NOT DETECTED

## 2024-05-10 LAB — BRAIN NATRIURETIC PEPTIDE: B Natriuretic Peptide: 629 pg/mL — ABNORMAL HIGH (ref 0.0–100.0)

## 2024-05-10 LAB — FIBRINOGEN: Fibrinogen: 381 mg/dL (ref 210–475)

## 2024-05-10 LAB — MAGNESIUM: Magnesium: 1.9 mg/dL (ref 1.7–2.4)

## 2024-05-10 MED ORDER — IPRATROPIUM-ALBUTEROL 0.5-2.5 (3) MG/3ML IN SOLN
3.0000 mL | RESPIRATORY_TRACT | Status: DC
  Administered 2024-05-10 – 2024-05-11 (×12): 3 mL via RESPIRATORY_TRACT
  Filled 2024-05-10 (×12): qty 3

## 2024-05-10 MED ORDER — HYDROMORPHONE HCL 1 MG/ML IJ SOLN
0.5000 mg | INTRAMUSCULAR | Status: DC | PRN
  Administered 2024-05-10: 0.5 mg via INTRAVENOUS
  Filled 2024-05-10: qty 1

## 2024-05-10 MED ORDER — DEXTROSE 50 % IV SOLN
INTRAVENOUS | Status: AC
Start: 2024-05-10 — End: 2024-05-10
  Administered 2024-05-10: 25 g via INTRAVENOUS
  Filled 2024-05-10: qty 50

## 2024-05-10 MED ORDER — SODIUM CHLORIDE 0.9 % IV SOLN
2.0000 g | INTRAVENOUS | Status: DC
  Administered 2024-05-10 – 2024-05-11 (×2): 2 g via INTRAVENOUS
  Filled 2024-05-10 (×2): qty 20

## 2024-05-10 MED ORDER — SODIUM CHLORIDE 0.9% IV SOLUTION: Freq: Once | INTRAVENOUS | Status: AC

## 2024-05-10 MED ORDER — DEXMEDETOMIDINE HCL IN NACL 400 MCG/100ML IV SOLN
0.0000 ug/kg/h | INTRAVENOUS | Status: DC
  Administered 2024-05-10: 0.4 ug/kg/h via INTRAVENOUS
  Filled 2024-05-10: qty 100

## 2024-05-10 MED ORDER — VASOPRESSIN 20 UNITS/100 ML INFUSION FOR SHOCK
0.0400 [IU]/min | INTRAVENOUS | Status: DC
  Administered 2024-05-10: 0.04 [IU]/min via INTRAVENOUS
  Administered 2024-05-11: 0.02 [IU]/min via INTRAVENOUS
  Administered 2024-05-11: 0.04 [IU]/min via INTRAVENOUS
  Filled 2024-05-10 (×3): qty 100

## 2024-05-10 MED ORDER — SODIUM BICARBONATE 8.4 % IV SOLN: 50.0000 meq | Freq: Once | INTRAVENOUS | Status: AC

## 2024-05-10 MED ORDER — SODIUM CHLORIDE 0.9% FLUSH
3.0000 mL | Freq: Two times a day (BID) | INTRAVENOUS | Status: DC
  Administered 2024-05-10 – 2024-05-11 (×3): 3 mL via INTRAVENOUS

## 2024-05-10 MED ORDER — FENTANYL BOLUS VIA INFUSION
25.0000 ug | INTRAVENOUS | Status: DC | PRN
  Administered 2024-05-11 (×2): 50 ug via INTRAVENOUS
  Administered 2024-05-11 (×2): 25 ug via INTRAVENOUS
  Administered 2024-05-11: 50 ug via INTRAVENOUS

## 2024-05-10 MED ORDER — NYSTATIN 100000 UNIT/ML MT SUSP: 5.0000 mL | Freq: Four times a day (QID) | OROMUCOSAL | Status: DC

## 2024-05-10 MED ORDER — FENTANYL CITRATE PF 50 MCG/ML IJ SOSY: 50.0000 ug | PREFILLED_SYRINGE | Freq: Once | INTRAMUSCULAR | Status: AC

## 2024-05-10 MED ORDER — DEXMEDETOMIDINE HCL IN NACL 200 MCG/50ML IV SOLN: 0.0000 ug/kg/h | INTRAVENOUS | Status: DC

## 2024-05-10 MED ORDER — NOREPINEPHRINE 4 MG/250ML-% IV SOLN
0.0000 ug/min | INTRAVENOUS | Status: DC
  Administered 2024-05-10: 40 ug/min via INTRAVENOUS
  Filled 2024-05-10: qty 250

## 2024-05-10 MED ORDER — SODIUM CHLORIDE 0.9 % IV SOLN
1.0000 mg/h | Freq: Once | INTRAVENOUS | Status: AC
  Administered 2024-05-10: 1 mg/h via INTRAVENOUS
  Filled 2024-05-10: qty 24

## 2024-05-10 MED ORDER — PHENYLEPHRINE 80 MCG/ML (10ML) SYRINGE FOR IV PUSH (FOR BLOOD PRESSURE SUPPORT): 80.0000 ug | PREFILLED_SYRINGE | Freq: Once | INTRAVENOUS | Status: DC | PRN

## 2024-05-10 MED ORDER — SODIUM BICARBONATE 8.4 % IV SOLN: 100.0000 meq | Freq: Once | INTRAVENOUS | Status: AC

## 2024-05-10 MED ORDER — SODIUM BICARBONATE 8.4 % IV SOLN
50.0000 meq | Freq: Once | INTRAVENOUS | Status: AC
  Administered 2024-05-10: 50 meq via INTRAVENOUS
  Filled 2024-05-10: qty 50

## 2024-05-10 MED ORDER — DEXMEDETOMIDINE HCL IN NACL 200 MCG/50ML IV SOLN
INTRAVENOUS | Status: AC
  Administered 2024-05-10: 0.4 ug/kg/h via INTRAVENOUS
  Filled 2024-05-10: qty 50

## 2024-05-10 MED ORDER — HYDROMORPHONE HCL 1 MG/ML IJ SOLN
1.0000 mg | INTRAMUSCULAR | Status: DC | PRN
  Administered 2024-05-10 (×3): 1 mg via INTRAVENOUS
  Filled 2024-05-10 (×3): qty 1

## 2024-05-10 MED ORDER — SODIUM CHLORIDE 0.9% FLUSH: 10.0000 mL | INTRAVENOUS | Status: DC | PRN

## 2024-05-10 MED ORDER — MIDAZOLAM HCL 2 MG/2ML IJ SOLN: 1.0000 mg | INTRAMUSCULAR | Status: DC | PRN

## 2024-05-10 MED ORDER — FENTANYL CITRATE PF 50 MCG/ML IJ SOSY
PREFILLED_SYRINGE | INTRAMUSCULAR | Status: AC
  Administered 2024-05-10: 50 ug
  Filled 2024-05-10: qty 2

## 2024-05-10 MED ORDER — FENTANYL CITRATE PF 50 MCG/ML IJ SOSY: 25.0000 ug | PREFILLED_SYRINGE | Freq: Once | INTRAMUSCULAR | Status: DC

## 2024-05-10 MED ORDER — MIDAZOLAM HCL 2 MG/2ML IJ SOLN
INTRAMUSCULAR | Status: AC
  Filled 2024-05-10: qty 2

## 2024-05-10 MED ORDER — DEXTROSE 50 % IV SOLN: 25.0000 g | Freq: Once | INTRAVENOUS | Status: AC

## 2024-05-10 MED ORDER — ORAL CARE MOUTH RINSE
15.0000 mL | OROMUCOSAL | Status: DC
  Administered 2024-05-10 – 2024-05-11 (×11): 15 mL via OROMUCOSAL

## 2024-05-10 MED ORDER — SODIUM CHLORIDE 0.9 % IV SOLN: INTRAVENOUS | Status: DC

## 2024-05-10 MED ORDER — SODIUM CHLORIDE 0.9% FLUSH
10.0000 mL | Freq: Two times a day (BID) | INTRAVENOUS | Status: DC
  Administered 2024-05-11 (×2): 10 mL

## 2024-05-10 MED ORDER — FENTANYL 2500MCG IN NS 250ML (10MCG/ML) PREMIX INFUSION
0.0000 ug/h | INTRAVENOUS | Status: DC
  Administered 2024-05-10: 50 ug/h via INTRAVENOUS
  Administered 2024-05-11: 25 ug/h via INTRAVENOUS

## 2024-05-10 MED ORDER — NOREPINEPHRINE 4 MG/250ML-% IV SOLN
INTRAVENOUS | Status: AC
  Filled 2024-05-10: qty 250

## 2024-05-10 MED ORDER — SODIUM CHLORIDE 0.9 % IV SOLN: INTRAVENOUS | Status: AC | PRN

## 2024-05-10 MED ORDER — PHENYLEPHRINE 80 MCG/ML (10ML) SYRINGE FOR IV PUSH (FOR BLOOD PRESSURE SUPPORT)
PREFILLED_SYRINGE | INTRAVENOUS | Status: AC
  Administered 2024-05-10: 56 ug
  Filled 2024-05-10: qty 10

## 2024-05-10 MED ORDER — SODIUM BICARBONATE 8.4 % IV SOLN
INTRAVENOUS | Status: DC
  Filled 2024-05-10 (×2): qty 1000
  Filled 2024-05-10 (×3): qty 150

## 2024-05-10 MED ORDER — CHLORHEXIDINE GLUCONATE CLOTH 2 % EX PADS
6.0000 | MEDICATED_PAD | Freq: Every day | CUTANEOUS | Status: DC
  Administered 2024-05-10: 6 via TOPICAL

## 2024-05-10 MED ORDER — FENTANYL 2500MCG IN NS 250ML (10MCG/ML) PREMIX INFUSION
INTRAVENOUS | Status: AC
  Filled 2024-05-10: qty 250

## 2024-05-10 MED ORDER — LIDOCAINE HCL 1 % IJ SOLN
20.0000 mL | Freq: Once | INTRAMUSCULAR | Status: AC
  Administered 2024-05-10: 20 mL via INTRADERMAL

## 2024-05-10 MED ORDER — FENTANYL CITRATE (PF) 100 MCG/2ML IJ SOLN
INTRAMUSCULAR | Status: DC
Start: 2024-05-10 — End: 2024-05-10
  Filled 2024-05-10: qty 2

## 2024-05-10 MED ORDER — POLYETHYLENE GLYCOL 3350 17 G PO PACK: 17.0000 g | PACK | Freq: Every day | ORAL | Status: DC

## 2024-05-10 MED ORDER — NOREPINEPHRINE 4 MG/250ML-% IV SOLN
INTRAVENOUS | Status: AC
Start: 2024-05-10 — End: 2024-05-11
  Administered 2024-05-10: 4 mg
  Filled 2024-05-10: qty 250

## 2024-05-10 MED ORDER — DOCUSATE SODIUM 50 MG/5ML PO LIQD: 100.0000 mg | Freq: Two times a day (BID) | ORAL | Status: DC

## 2024-05-10 MED ORDER — IOHEXOL 350 MG/ML SOLN
75.0000 mL | Freq: Once | INTRAVENOUS | Status: AC | PRN
  Administered 2024-05-10: 75 mL via INTRAVENOUS

## 2024-05-10 MED ORDER — EPINEPHRINE HCL 5 MG/250ML IV SOLN IN NS
0.5000 ug/min | INTRAVENOUS | Status: DC
  Administered 2024-05-10 – 2024-05-11 (×2): 20 ug/min via INTRAVENOUS
  Administered 2024-05-11: 14 ug/min via INTRAVENOUS
  Administered 2024-05-11: 20 ug/min via INTRAVENOUS
  Administered 2024-05-11: 16 ug/min via INTRAVENOUS
  Filled 2024-05-10 (×5): qty 250

## 2024-05-10 MED ORDER — MIDAZOLAM HCL 2 MG/2ML IJ SOLN: 2.0000 mg | Freq: Once | INTRAMUSCULAR | Status: AC

## 2024-05-10 MED ORDER — FAMOTIDINE 20 MG PO TABS: 20.0000 mg | ORAL_TABLET | Freq: Two times a day (BID) | ORAL | Status: DC

## 2024-05-10 MED ORDER — MIDAZOLAM HCL 2 MG/2ML IJ SOLN
INTRAMUSCULAR | Status: AC
  Administered 2024-05-10: 2 mg
  Filled 2024-05-10: qty 2

## 2024-05-10 MED ORDER — SODIUM CHLORIDE 0.9 % IV SOLN: 250.0000 mL | INTRAVENOUS | Status: AC | PRN

## 2024-05-10 MED ORDER — INSULIN ASPART 100 UNIT/ML IJ SOLN
0.0000 [IU] | INTRAMUSCULAR | Status: DC
  Filled 2024-05-10: qty 0.09

## 2024-05-10 MED ORDER — SODIUM BICARBONATE 8.4 % IV SOLN
INTRAVENOUS | Status: AC
  Administered 2024-05-10: 100 meq via INTRAVENOUS
  Administered 2024-05-10: 50 meq
  Filled 2024-05-10: qty 100

## 2024-05-10 MED ORDER — EPINEPHRINE HCL 5 MG/250ML IV SOLN IN NS
INTRAVENOUS | Status: AC
  Filled 2024-05-10: qty 250

## 2024-05-10 MED ORDER — HEPARIN BOLUS VIA INFUSION
1500.0000 [IU] | Freq: Once | INTRAVENOUS | Status: AC
  Administered 2024-05-10: 1500 [IU] via INTRAVENOUS
  Filled 2024-05-10: qty 1500

## 2024-05-10 MED ORDER — SODIUM BICARBONATE 8.4 % IV SOLN
INTRAVENOUS | Status: AC
  Administered 2024-05-10: 100 meq via INTRAVENOUS
  Filled 2024-05-10: qty 100

## 2024-05-10 MED ORDER — SODIUM CHLORIDE 0.9 % IV SOLN
100.0000 mg | Freq: Two times a day (BID) | INTRAVENOUS | Status: DC
  Administered 2024-05-10 – 2024-05-11 (×2): 100 mg via INTRAVENOUS
  Filled 2024-05-10 (×4): qty 100

## 2024-05-10 MED ORDER — ACETAMINOPHEN 325 MG PO TABS: 650.0000 mg | ORAL_TABLET | ORAL | Status: DC | PRN

## 2024-05-10 MED ORDER — DEXTROSE 50 % IV SOLN
INTRAVENOUS | Status: AC
  Administered 2024-05-10: 50 mL
  Filled 2024-05-10: qty 50

## 2024-05-10 MED ORDER — SODIUM CHLORIDE 0.9% FLUSH: 3.0000 mL | INTRAVENOUS | Status: DC | PRN

## 2024-05-10 MED ORDER — DEXTROSE 50 % IV SOLN
INTRAVENOUS | Status: AC
  Filled 2024-05-10: qty 50

## 2024-05-10 MED ORDER — ALBUMIN HUMAN 25 % IV SOLN
12.5000 g | Freq: Once | INTRAVENOUS | Status: AC
  Administered 2024-05-10: 12.5 g via INTRAVENOUS
  Filled 2024-05-10: qty 50

## 2024-05-10 MED ORDER — ETOMIDATE 2 MG/ML IV SOLN
INTRAVENOUS | Status: AC
  Filled 2024-05-10: qty 20

## 2024-05-10 MED ORDER — LACTATED RINGERS IV SOLN: INTRAVENOUS | Status: DC

## 2024-05-10 MED ORDER — ONDANSETRON HCL 4 MG/2ML IJ SOLN: 4.0000 mg | Freq: Four times a day (QID) | INTRAMUSCULAR | Status: DC | PRN

## 2024-05-10 MED ORDER — SODIUM BICARBONATE 8.4 % IV SOLN
100.0000 meq | Freq: Once | INTRAVENOUS | Status: AC
  Administered 2024-05-10: 100 meq via INTRAVENOUS

## 2024-05-10 MED ORDER — MIDAZOLAM HCL 2 MG/2ML IJ SOLN
INTRAMUSCULAR | Status: AC | PRN
  Administered 2024-05-10: .5 mg via INTRAVENOUS

## 2024-05-10 MED ORDER — ROCURONIUM BROMIDE 10 MG/ML (PF) SYRINGE
PREFILLED_SYRINGE | INTRAVENOUS | Status: AC
  Administered 2024-05-10: 60 mg
  Filled 2024-05-10: qty 10

## 2024-05-10 MED ORDER — PANTOPRAZOLE SODIUM 40 MG IV SOLR
40.0000 mg | Freq: Two times a day (BID) | INTRAVENOUS | Status: DC
  Administered 2024-05-10 – 2024-05-11 (×3): 40 mg via INTRAVENOUS
  Filled 2024-05-10 (×3): qty 10

## 2024-05-10 MED ORDER — ORAL CARE MOUTH RINSE: 15.0000 mL | OROMUCOSAL | Status: DC | PRN

## 2024-05-10 MED ORDER — VASOPRESSIN 20 UNITS/100 ML INFUSION FOR SHOCK
INTRAVENOUS | Status: AC
Start: 2024-05-10 — End: 2024-05-10
  Administered 2024-05-10: 20 [IU] via INTRAVENOUS
  Filled 2024-05-10: qty 100

## 2024-05-10 MED ORDER — IPRATROPIUM-ALBUTEROL 0.5-2.5 (3) MG/3ML IN SOLN: 3.0000 mL | Freq: Four times a day (QID) | RESPIRATORY_TRACT | Status: DC | PRN

## 2024-05-10 MED ORDER — DEXTROSE 50 % IV SOLN: 1.0000 | Freq: Once | INTRAVENOUS | Status: AC

## 2024-05-10 MED ORDER — NOREPINEPHRINE 16 MG/250ML-% IV SOLN
0.0000 ug/min | INTRAVENOUS | Status: DC
  Administered 2024-05-10 – 2024-05-11 (×2): 40 ug/min via INTRAVENOUS
  Administered 2024-05-11: 26 ug/min via INTRAVENOUS
  Filled 2024-05-10 (×3): qty 250

## 2024-05-10 MED ORDER — LIDOCAINE HCL 1 % IJ SOLN
INTRAMUSCULAR | Status: AC
  Filled 2024-05-10: qty 20

## 2024-05-10 MED ORDER — SODIUM BICARBONATE 8.4 % IV SOLN
INTRAVENOUS | Status: AC
  Filled 2024-05-10: qty 100

## 2024-05-10 NOTE — H&P (Signed)
 NAME:  Jeff Romero, MRN:  [id number], DOB:  [birth year], LOS: 0 ADMISSION DATE:  05/09/2024, CONSULTATION DATE:  05/10/2024 REFERRING MD:  Jeff Hughes, MD, CHIEF COMPLAINT:  SOB  History of Present Illness:  Patient is a 63 y/o male who has a PMH for Esophagitis with ulceration, constipation secondary top narcotic pain meds, Stage IV Lung cancer-Non-Small Cell with mets top bone and liver, PE with failure on Eliquis  and currently on BID Lovenox  injections, s/p bonchoscopic biopsy and s/p RT and Currently on Immunotherapy who presented with sudden onset SOB.  Per wife who is giving the history.  The patient was admitted to [facility] in April when they discovered his lung cancer.  He has been losing weight as well with poor appetite, Wife says his appetite has improved last couple days secondary to Marijuana laced butter to help boost his appetite.    Pertinent  Medical History  Esophagitis with ulceration, constipation secondary top narcotic pain meds, Stage IV Lung cancer-Non-Small Cell with mets top bone and liver, PE with failure on Eliquis  and currently on BID Lovenox  injections, s/p bonchoscopic biopsy and s/p RT and Currently on Immunotherapy  Significant Hospital Events: Including procedures, antibiotic start and stop dates in addition to other pertinent events   8/6: Admit to ICU  Interim History / Subjective:  N/a  Objective    Blood pressure 107/70, pulse (!) 143, temperature 99 F (37.2 C), temperature source Axillary, resp. rate (!) 32, height 5' 7 (1.702 m), weight 54.9 kg, SpO2 94%.       No intake or output data in the 24 hours ending 05/10/24 0015 Filed Weights   05/09/24 2119  Weight: 54.9 kg    Examination: General: Awake but SOB and using Accessory muscles HENT: PERRLA, no icterus, EOMI Lungs: Wheezes b/l, diminished breath sounds b/l, no rales Cardiovascular: reg tachy no murmurs Abdomen: soft nt nd bs pos no guarding Extremities: minimal edema, non-pitting Neuro:  Alert but not talking, using energy for work of breathing, non-focal, CN II to XII grossly intact   Resolved problem list   Assessment and Plan  Acute Hypoxic Respiratory Failure Patient to get a CTA chest assess for new Pulmonary embolism given his symptoms and increased BNP/Trops along with his new hypoxemia and Tachycardia BiPAP support for his SOB and use of accessory muscles, however, he is not able to keep the BiPAP on and keeps taking it off Add Precedex , he did receive Versed  and Fentanyl  but this lasted for only a short period of time Normally takes 0.5 mg Dilaudid  every 4 hoiurs and Oxycontin  10mg   every 8 hours Has not had a BM x 10 days as a result of his pain meds Possible New Pulmonary Emboli Empirically starting Heparin  drip pending the CTA Anemia of chronic disease Monitor H/H while on Heparin  drip H/o esophageal ulcer as well Metastatic Lung cancer Stage IV on Immunotherapy Cxr showing right lung atelectasis, last CT chest 6 days ago showing significant cancerous mass with significant atelectasis of right lung 5. Wheezing b/l Bronchiospasm, consider COPD exacerbation or wheezing caused by PE Add bronchodilators  Best Practice (right click and Reselect all SmartList Selections daily)   Diet/type: NPO DVT prophylaxis systemic heparin  Pressure ulcer(s): N/A GI prophylaxis: H2B Lines: N/A Foley:  N/A Code Status:  full code   Labs   CBC: Recent Labs  Lab 05/09/24 2145  WBC 16.8*  NEUTROABS 14.9*  HGB 7.8*  HCT 27.2*  MCV 89.2  PLT 326    Basic Metabolic  Panel: Recent Labs  Lab 05/09/24 2145  NA 138  K 3.7  CL 101  CO2 20*  GLUCOSE 130*  BUN 12  CREATININE 0.64  CALCIUM  8.4*   GFR: Estimated Creatinine Clearance: 73.4 mL/min (by C-G formula based on SCr of 0.64 mg/dL). Recent Labs  Lab 05/09/24 2145  WBC 16.8*    Liver Function Tests: No results for input(s): AST, ALT, ALKPHOS, BILITOT, PROT, ALBUMIN  in the last 168  hours. No results for input(s): LIPASE, AMYLASE in the last 168 hours. No results for input(s): AMMONIA in the last 168 hours.  ABG No results found for: PHART, PCO2ART, PO2ART, HCO3, TCO2, ACIDBASEDEF, O2SAT   Coagulation Profile: No results for input(s): INR, PROTIME in the last 168 hours.  Cardiac Enzymes: No results for input(s): CKTOTAL, CKMB, CKMBINDEX, TROPONINI in the last 168 hours.  HbA1C: No results found for: HGBA1C  CBG: No results for input(s): GLUCAP in the last 168 hours.  Review of Systems:   Unable to get, patient in respiratory distress  Past Medical History:  He,  has a past medical history of Allergy, Anxiety, Arthritis, Blood transfusion without reported diagnosis, Cataract, Depression, Malignant neoplasm of upper lobe of right lung (HCC) (01/20/2024), and Prostate cancer (HCC).   Surgical History:   Past Surgical History:  Procedure Laterality Date   BRONCHIAL BIOPSY  01/21/2024   Procedure: BRONCHOSCOPY, WITH BIOPSY;  Surgeon: Gretta Leita SQUIBB, DO;  Location: [facility];  Service: Cardiopulmonary;;   BRONCHIAL BRUSHINGS  01/21/2024   Procedure: BRONCHOSCOPY, WITH BRUSH BIOPSY;  Surgeon: Gretta Leita SQUIBB, DO;  Location: [facility];  Service: Cardiopulmonary;;   ESOPHAGOGASTRODUODENOSCOPY N/A 04/12/2024   Procedure: EGD (ESOPHAGOGASTRODUODENOSCOPY);  Surgeon: Wilhelmenia Aloha Raddle., MD;  Location: [facility];  Service: Gastroenterology;  Laterality: N/A;   FLEXIBLE BRONCHOSCOPY N/A 01/21/2024   Procedure: BRONCHOSCOPY, FLEXIBLE;  Surgeon: Gretta Leita SQUIBB, DO;  Location: [facility];  Service: Cardiopulmonary;  Laterality: N/A;   INGUINAL HERNIA REPAIR Right 09/18/2021   Procedure: OPEN RIGHT INGUINAL HERNIA REPAIR WITH MESH;  Surgeon: Kinsinger, Herlene Righter, MD;  Location: [facility];  Service: General;  Laterality: Right;   IR THORACENTESIS ASP PLEURAL SPACE W/IMG GUIDE  03/21/2024   KNEE CARTILAGE SURGERY Right     PROSTATE SURGERY     ROTATOR CUFF REPAIR Right      Social History:   reports that he quit smoking about 10 years ago. His smoking use included e-cigarettes. He has never used smokeless tobacco. He reports current alcohol  use of about 6.0 standard drinks of alcohol  per week. He reports that he does not use drugs.   Family History:  His family history includes Alcohol  abuse in his mother; Bone cancer in his father; Cirrhosis in his mother; Hypertension in his brother and sister; Sickle cell anemia in his mother. There is no history of Colon cancer, Esophageal cancer, Rectal cancer, or Stomach cancer.   Allergies No Known Allergies   Home Medications  Prior to Admission medications   Medication Sig Start Date End Date Taking? Authorizing Provider  acetaminophen  (TYLENOL ) 500 MG tablet Take 2 tablets (1,000 mg total) by mouth every 8 (eight) hours. 02/15/24   Ghimire, Donalda HERO, MD  albuterol  (VENTOLIN  HFA) 108 (90 Base) MCG/ACT inhaler Inhale 1 puff into the lungs every 6 (six) hours as needed for wheezing or shortness of breath. 12/23/23   [provider]  benzonatate  (TESSALON ) 200 MG capsule Take 1 capsule (200 mg total) by mouth 3 (three) times daily. 03/28/24   Pickenpack-Cousar,  Fannie SAILOR, NP  enoxaparin  (LOVENOX ) 60 MG/0.6ML injection Inject 0.6 mLs (60 mg total) into the skin 2 (two) times daily. 04/25/24 07/24/24  Heilingoetter, Cassandra L, PA-C  ferrous sulfate 325 (65 FE) MG tablet Take 325 mg by mouth daily with breakfast.    [provider]  gabapentin  (NEURONTIN ) 100 MG capsule Take 1 capsule (100 mg total) by mouth 3 (three) times daily. 05/02/24   Pickenpack-Cousar, Athena N, NP  guaiFENesin -codeine  100-10 MG/5ML syrup Take 10 mLs by mouth every 4 (four) hours as needed for cough. 04/15/24   Will Almarie MATSU, MD  HYDROcodone  bit-homatropine [facility]) 5-1.5 MG/5ML syrup Take 5 mLs by mouth every 8 (eight) hours as needed for cough. 03/28/24   Pickenpack-Cousar,  Athena N, NP  HYDROmorphone  (DILAUDID ) 2 MG tablet Take 0.5 tablets (1 mg total) by mouth every 4 (four) hours as needed for severe pain (pain score 7-10). Patient taking differently: Take 1 mg by mouth in the morning, at noon, in the evening, and at bedtime. 04/20/24   Pickenpack-Cousar, Athena N, NP  megestrol  (MEGACE ) 20 MG tablet Take 40 mg by mouth 4 (four) times daily -  with meals and at bedtime. 02/21/24   [provider]  metoprolol  succinate (TOPROL  XL) 25 MG 24 hr tablet Take 0.5 tablets (12.5 mg total) by mouth daily. 04/26/24   Vicci Rollo SAUNDERS, PA-C  ondansetron  (ZOFRAN ) 8 MG tablet Take 0.5 tablets (4 mg total) by mouth every 8 (eight) hours as needed for nausea. 03/28/24   Pickenpack-Cousar, Fannie SAILOR, NP  OVER THE COUNTER MEDICATION Apply 1 Application topically daily. Topical cream with unknown active ingredient/strength. Sample given by oncologist in office for daily use on radiation burn.    [provider]  oxyCODONE  (OXYCONTIN ) 10 mg 12 hr tablet Take 1 tablet (10 mg total) by mouth every 8 (eight) hours. 04/20/24   Pickenpack-Cousar, Athena N, NP  pantoprazole  (PROTONIX ) 40 MG tablet Take 1 tablet (40 mg total) by mouth 2 (two) times daily. 04/15/24   Will Almarie MATSU, MD  pembrolizumab  (KEYTRUDA ) 100 MG/4ML SOLN Inject 200 mg into the vein every 21 ( twenty-one) days.    [provider]  polyethylene glycol powder (GLYCOLAX /MIRALAX ) 17 GM/SCOOP powder Take 17 g by mouth daily. 02/15/24   Ghimire, Donalda HERO, MD  senna-docusate (SENOKOT-S) 8.6-50 MG tablet Take 2 tablets by mouth at bedtime. 02/15/24   Ghimire, Donalda HERO, MD  spironolactone  (ALDACTONE ) 25 MG tablet Take 0.5 tablets (12.5 mg total) by mouth daily. 04/26/24 07/25/24  Vicci Rollo SAUNDERS, PA-C     Critical care time: 3   The patient is critically ill with multiple organ system failure and requires high complexity decision making for assessment and support, frequent evaluation and titration  of therapies, advanced monitoring, review of radiographic studies and interpretation of complex data.   Critical Care Time devoted to patient care services, exclusive of separately billable procedures, described in this note is 35 minutes.   Orlin Fairly, MD [facility] See Amion for pager  If no response to pager , please call [phone] until 7pm After 7:00 pm call Elink  [phone] 05/10/2024, 12:15 AM

## 2024-05-10 NOTE — Progress Notes (Addendum)
 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  He also has history of PE, now with AC failure.  Admitted 8/6 with acute respiratory failure.  Shortness of Breath Bilateral PE with right heart strain History of PE Right Pleural Effusion - Status post Eliquis  previously.  Subsequently switched to Lovenox . - CT angio chest done 8/6 shows acute bil PE with CT evidence right heart strain.   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   - Imaging done 8/6 similar to previous CT which showed increase in size of supraclavicular lymph nodes. -Follows with palliative outpatient team -Patient last seen in outpatient oncology office on 04/25/2024.  Medical oncology/Dr. Sherrod following closely and will make further recommendations.   Anemia, normocytic - Likely multifactorial due to malignancy and oncology therapies - Hemoglobin very low 5.2. Receiving PRBC transfusion today.   - Transfuse PRBC for hemoglobin <7.0 - Iron studies done, ferritin elevated 5127, no repletion recommended now. - Continue to monitor CBC with differential closely   Poor appetite Cachexia - Continue supportive care   Code Status Full  Subjective:  Patient seen awake and alert laying in his bed.  He is very ill-appearing and cachectic, respiratory distress with use of HFNC, noted use of accessory muscles.  Receiving PRBC when seen.  Patient's wife and family at bedside.    Objective:   Intake/Output Summary (Last 24 hours) at 05/10/2024 1253 Last data filed at 05/10/2024  0836 Gross per 24 hour  Intake 1166.81 ml  Output --  Net 1166.81 ml     PHYSICAL EXAMINATION: ECOG PERFORMANCE STATUS: 4 - Bedbound  Vitals:   05/10/24 1235 05/10/24 1246  BP: (!) 155/107 (!) 221/155  Pulse: (!) 117 (!) 115  Resp: (!) 32 (!) 32  Temp:    SpO2: 98% 91%   Filed Weights   05/09/24 2119  Weight: 121 lb (54.9 kg)    GENERAL: +ill-appearing +cachectic  SKIN: skin color, texture, turgor are normal, no rashes or significant lesions EYES: normal, conjunctiva are pink and non-injected, sclera clear OROPHARYNX: no exudate, no erythema and lips, buccal mucosa, and tongue normal  NECK: supple, thyroid  normal size, non-tender, without nodularity LYMPH: no palpable lymphadenopathy in the cervical, axillary or inguinal LUNGS: +respiratory distress on HFNC HEART: regular rate & rhythm and no murmurs and no lower extremity edema ABDOMEN: abdomen soft, non-tender and normal bowel sounds MUSCULOSKELETAL: no cyanosis of digits and no clubbing  PSYCH: alert & oriented x 3 with fluent speech NEURO: no focal motor/sensory deficits   All questions were answered. The patient knows to call the clinic with any problems, questions or concerns.   The total time spent in the appointment was 40 minutes encounter with patient including review of chart and various tests results, discussions about plan of care and coordination of care plan  [name], NP 05/10/2024 12:53 PM    Labs Reviewed:  Lab Results  Component Value Date   WBC 15.1 (H) 05/10/2024   HGB 5.4 (LL) 05/10/2024   HCT 16.0 (L) 05/10/2024   MCV 92.3 05/10/2024  PLT 281 05/10/2024   Recent Labs    04/15/24 0732 04/25/24 0755 05/02/24 0957 05/09/24 2145 05/10/24 0340 05/10/24 0842  NA 135 136 137 138 140 136  K 3.3* 3.6 3.6 3.7 4.0 4.5  CL 104 102 101 101 101  --   CO2 23 25 26  20* 17*  --   GLUCOSE 121* 94 133* 130* 104*  --   BUN 10 10 11 12 15   --   CREATININE 0.56* 0.58* 0.73 0.64 0.81  --    CALCIUM  7.7* 7.8* 8.5* 8.4* 8.2*  --   GFRNONAA >60 >60 >60 >60 >60  --   PROT 5.1* 6.2* 6.7  --   --   --   ALBUMIN  <1.5* 2.3* 2.4*  --   --   --   AST 97* 22 16  --   --   --   ALT 95* 21 16  --   --   --   ALKPHOS 355* 206* 186*  --   --   --   BILITOT 0.7 0.5 0.7  --   --   --     Studies Reviewed:  DG Abd 1 View Result Date: 05/10/2024 CLINICAL DATA:  Constipation. EXAM: ABDOMEN - 1 VIEW COMPARISON:  CT 05/04/2024 FINDINGS: No bowel dilatation to suggest obstruction. Small volume of formed stool in the colon. No abnormal rectal distention. No visible radiopaque calculi. Known osseous lesions are not well demonstrated by radiograph. IMPRESSION: Small volume of formed stool in the colon. No bowel obstruction. Electronically Signed   By: Andrea Gasman M.D.   On: 05/10/2024 11:39   CT Angio Chest PE W and/or Wo Contrast Result Date: 05/10/2024 CLINICAL DATA:  Worsening dyspnea malignancy EXAM: CT ANGIOGRAPHY CHEST WITH CONTRAST TECHNIQUE: Multidetector CT imaging of the chest was performed using the standard protocol during bolus administration of intravenous contrast. Multiplanar CT image reconstructions and MIPs were obtained to evaluate the vascular anatomy. RADIATION DOSE REDUCTION: This exam was performed according to the departmental dose-optimization program which includes automated exposure control, adjustment of the mA and/or kV according to patient size and/or use of iterative reconstruction technique. CONTRAST:  75mL OMNIPAQUE  IOHEXOL  350 MG/ML SOLN COMPARISON:  Chest x-ray 05/09/2024, CT 05/04/2024 FINDINGS: Cardiovascular: Satisfactory opacification of the pulmonary arteries to the segmental level. Multiple small acute right lower lobe segmental/subsegmental emboli. Left upper lobe small segmental and subsegmental emboli. Multiple left lower lobe segmental and subsegmental PE. Positive for right heart strain with RV LV ratio of 1.6. The heart appears slightly enlarged. Reflux of  contrast into the hepatic veins. No significant pericardial effusion. Mild aortic atherosclerosis. Narrowed appearing upper superior vena cava. Irregular foci of airspace disease in the right superior lower lobe Mediastinum/Nodes: Midline trachea. Supraclavicular adenopathy difficult to see due to streak artifact and paucity of fat. Known mediastinal and right hilar adenopathy is better seen on the 07/31 exam likely due to phase of imaging. Lungs/Pleura: Right hilar mass with narrowing of the bronchi. Masslike consolidation throughout the right upper lobe as seen on recent imaging. Underlying emphysema. Irregular foci of disease in the right lower lobe without recent significant change. Similar sized moderate right pleural effusion. Suspect areas of necrosis within the right upper lobe consolidation. Upper Abdomen: No acute finding Musculoskeletal: The previously measured suspected right anterior chest wall lesion is also more difficult to see due to phase of enhancement. Review of the MIP images confirms the above findings. IMPRESSION: 1. Positive for acute bilateral pulmonary emboli with CT evidence  for right heart strain (RV/LV Ratio = 1.6 ) consistent with at least submassive (intermediate risk) PE. The presence of right heart strain has been associated with an increased risk of morbidity and mortality. Please refer to the PE Focused order set in EP[facility]. 2. Redemonstrated large right hilar mass and masslike consolidation in the right upper lobe and infrahilar region, grossly similar as compared with the recent CT. Similar sized moderate right pleural effusion. Overall the findings are more difficult to measure/characterize compared to the CT from 07/31, likely due to phase of enhancement. 3. Known mediastinal, right hilar and supraclavicular adenopathy is better seen on the 07/31 exam likely due to phase of imaging. 4. Aortic atherosclerosis. Critical Value/emergent results were called by telephone at the time  of interpretation on 05/10/2024 at 2:16 am to provider Dr. Albertina , who verbally acknowledged these results. Aortic Atherosclerosis (ICD10-I70.0) and Emphysema (ICD10-J43.9). Electronically Signed   By: Luke Bun M.D.   On: 05/10/2024 02:19   CT CHEST ABDOMEN PELVIS W CONTRAST Result Date: 05/09/2024 CLINICAL DATA:  Right upper lobe neoplasm. Metastatic disease evaluation. EXAM: CT CHEST, ABDOMEN, AND PELVIS WITH CONTRAST TECHNIQUE: Multidetector CT imaging of the chest, abdomen and pelvis was performed following the standard protocol during bolus administration of intravenous contrast. RADIATION DOSE REDUCTION: This exam was performed according to the departmental dose-optimization program which includes automated exposure control, adjustment of the mA and/or kV according to patient size and/or use of iterative reconstruction technique. CONTRAST:  80mL OMNIPAQUE  IOHEXOL  300 MG/ML  SOLN COMPARISON:  CTA chest studies 03/15/2024 and 02/12/2024, and PET-CT 02/01/2024. FINDINGS: CT CHEST FINDINGS Cardiovascular: The cardiac size is normal. There is a small anterior pericardial effusion not seen previously. There is atherosclerosis in the aortic arch and proximal great vessels without aneurysm, stenosis or dissection. There is central bronchovascular encasement on the right due to hilar adenopathy. The right superior pulmonary vein is markedly narrowed by extrinsic compression. Other central vessels are only mildly narrowed. The pulmonary arteries are normal caliber. There is a small right lower lobe segmental embolus, not seen previously on 2:36 No other arterial embolic disease is seen on this nondedicated exam. Mediastinum/Nodes: There is right supraclavicular adenopathy, today is less well seen due to interval weight loss and decreased body fat. There is a lymph node adjacent the right lobe of thyroid  gland in this area measuring 1.3 cm short axis, was previously 1.9 cm. There is a right superior hilar mass,  more difficult to see due to increased adjacent lung consolidation, appears to measure 3.7 x 3.1 cm on 2:28, previously 3.0 x 2.8 cm. There is a right mid hilar nodal mass today measuring 3.4 x 2.6 cm on 2:35, was previously 3.1 x 2.1 cm. Low right paratracheal nodal mass is 2.5 x 2.6 cm on 2:25, previously 2.6 x 2.6 cm. A subcarinal lymph node is 1.4 cm short axis, unchanged. There is a left hilar lymph node measuring 1.4 cm in short axis, previously 1.9 cm. Negative thyroid  gland, thoracic trachea, thoracic esophagus and axillary spaces. Lungs/Pleura: Moderate right pleural effusion, again tracks into the apex where it may be partially loculated. The fluid is similar in volume to the last CT with minimal left pleural effusion new in the interval. Paraseptal and small bullous emphysematous changes are noted in the upper lobes. A centrally necrotic peripherally enhancing right upper lobe mass measures 4.6 x 3.6 cm on 2:25, previously 5.0 x 4.0 cm. There is interval increased peritumoral dense airspace consolidation in the right upper  lobe, small amount of perihilar airspace disease right middle lobe probably related to neck strain is compression of the bronchus intermedius by adenopathy. The distal right main bronchus and right lower lobe basal subsegmental bronchi are also mildly narrowed by this. There is a small amount of airspace disease in the superior segment of right lower lobe. There is a 7 mm left lower lobe nodule on 7:104, suspected metastatic and was larger previously measuring 1.6 cm. Linear atelectatic changes are noted in the left upper lobe, posterior lower lobes. No other visible nodules. Musculoskeletal: There is a heterogeneous mass straddling the pleural space in the right chest wall in the anteromedial first intercostal space. Today this measures 4.5 x 2.8 cm on 2:22, was previously 3.5 x 2.3 cm. There is associated destructive lytic change of the underlying anteromedial second rib, also new. No  other regional metastatic bone abnormality is seen. CT ABDOMEN PELVIS FINDINGS Hepatobiliary: In segment 4A there are 2 new irregularly rim enhancing hypodense masses consistent with necrotic metastases, measuring 5.2 x 4.5 cm on 2:51, and further down near liver hilum a larger similar mass is 7.1 x 5.1 cm on 2:59. In segment 5 there is an enhancing mass with a greater solid component measuring 2.0 x 2.1 cm on 2:82, and in segment 3 there is a rim enhancing metastasis measuring 2.7 x 2.0 cm on 2:83. There is a stable 2.2 cm cyst in segment 5. No other focal mass enhancement. The liver is enlarged measuring 23.5 cm length. Gallbladder and bile ducts are unremarkable. Pancreas: No abnormality. Spleen: No abnormality. Adrenals/Urinary Tract: There is no adrenal or renal mass enhancement. No urinary stone or obstruction. The bladder contracted and not well seen. Stomach/Bowel: Negative for dilatation or wall thickening, including the appendix. Mild-to-moderate fecal stasis. Vascular/Lymphatic: There is heavy for age aortoiliac calcific plaque without aneurysm. Enlarged lymph nodes noted anterior to the abdominal aorta, largest 1.4 cm short axis. No further adenopathy. Reproductive: No prostatomegaly. Both testicles are in the scrotal sac. Other: Increased body wall anasarca with interval weight loss and decreased body fat stores. Findings consistent with cachexia. There is mild free ascites. There is haziness in the mesentery probably related to malnutrition or hepatic dysfunction. Musculoskeletal: There are progressive bone metastases compared with the PET-CT. There previously was a subcentimeter lytic lesion in the right iliac wing which has enlarged and destroyed the overlying bone with protrusion into the overlying and underlying pelvic musculature. This measures 4.9 x 3.1 cm on 2:99. There is a new lytic lesion in the right ischial tuberosity measuring 2.1 x 1.8 cm on 2:119, a new lytic lesion in the posterior left  ilium measuring 1.9 x 1.9 cm on 2:101, and a new lytic destructive lesion is present at S1 anteriorly eccentric to the left and measures 4.3 x 2.7 cm. I do not see any lesions in the lumbar vertebrae. IMPRESSION: 1. Interval progression of musculoskeletal metastatic disease. 2. Right upper lobe mass is slightly smaller but there is interval increased peritumoral airspace consolidation in the right upper lobe, and a small amount of airspace disease in the superior segment of the right lower lobe and in the perihilar right middle lobe. 3. Right hilar adenopathy slightly larger. Mediastinal adenopathy basically stable. 4. Right supraclavicular adenopathy is less well seen due to interval weight loss and decreased body fat stores. May have slightly improved. 5. New small right lower lobe segmental arterial embolus. No findings of right heart strain. No other arterial embolus on this nondedicated exam. 6.  New small left pleural effusion. Moderate right pleural fluid seems unchanged. 7. Left lower lobe nodule metastasis is smaller. 8. New liver metastases.  Progressive bone metastases. 9. Increased body wall anasarca with interval weight loss and decreased body fat stores. 10. Mild free ascites. 11. Aortic and coronary artery atherosclerosis. 12. Emphysema. Aortic Atherosclerosis (ICD10-I70.0) and Emphysema (ICD10-J43.9). Electronically Signed   By: Francis Quam M.D.   On: 05/09/2024 23:43   DG Chest Portable 1 View Result Date: 05/09/2024 CLINICAL DATA:  shortness of breath.  Lung cancer EXAM: PORTABLE CHEST 1 VIEW COMPARISON:  CT chest abdomen pelvis 05/04/2024 FINDINGS: The heart and mediastinal contours are within normal limits. Right upper lobe complete opacification with apical bullous changes consistent with known underlying malignancy. Left apical bullous changes. No pulmonary edema. Persistent trace to small right pleural effusion. No pneumothorax. No acute osseous abnormality. IMPRESSION: 1. Right upper lobe  complete opacification with apical bullous changes consistent with known underlying malignancy. Associated postobstructive atelectasis with possibly superimposed infection. Finding better evaluated on CT chest 05/04/2024. 2. Persistent trace to small right pleural effusion 3. No acute cardiopulmonary abnormality. Electronically Signed   By: Morgane  Naveau M.D.   On: 05/09/2024 22:10   MR Brain W Wo Contrast Result Date: 05/05/2024 CLINICAL DATA:  Provided history: Malignant neoplasm of unspecified part of unspecified bronchus or lung. Non-small cell lung cancer, staging. EXAM: MRI HEAD WITHOUT AND WITH CONTRAST TECHNIQUE: Multiplanar, multiecho pulse sequences of the brain and surrounding structures were obtained without and with intravenous contrast. CONTRAST:  6mL GADAVIST  GADOBUTROL  1 MMOL/ML IV SOLN COMPARISON:  Brain MRI 02/01/2024. FINDINGS: Brain: No age-advanced or lobar predominant cerebral atrophy. Subcentimeter focus of diffusion-weighted signal abnormality within the right thalamus without associated enhancement. The subcentimeter focus of diffusion-weighted signal abnormality demonstrated within the left frontal lobe on the prior MRI of 02/01/2024 is no longer present, and this likely reflected an acute/subacute infarct on the prior study. No chronic intracranial blood products. No extra-axial fluid collection. No midline shift. No pathologic intracranial enhancement identified. Vascular: Maintained flow voids within the proximal large arterial vessels. Skull and upper cervical spine: No focal worrisome marrow lesion. Incompletely assessed cervical spondylosis. Sinuses/Orbits: No mass or acute finding within the imaged orbits. No significant paranasal sinus disease. Impression #1 will be called to the ordering clinician or representative by the Radiologist Assistant, and communication documented in the PACS or [employer]. IMPRESSION: 1. Subcentimeter focus of non-enhancing diffusion-weighted  signal abnormality within the right thalamus. This likely reflects an acute/subacute infarct (rather than a small metastasis). However, a short-interval follow-up brain MRI (with and without contrast) is recommended in 6-8 weeks for surveillance. 2. The small focus of diffusion-weighted signal abnormality present within the left frontal lobe on the prior MRI of 02/01/2024 is no longer present, and this likely reflected an acute/subacute infarct on the prior study. Electronically Signed   By: Rockey Childs D.O.   On: 05/05/2024 09:55   DG Chest 1 View Result Date: 04/14/2024 CLINICAL DATA:  Cough.  Lung cancer. EXAM: CHEST  1 VIEW COMPARISON:  04/09/2024 FINDINGS: Progressive consolidation in the right upper lobe and potentially in the superior segment right lower lobe. Stable pleural thickening at the right lung apex. Left infrahilar fullness attributable to adenopathy, unchanged. Blunting of the right costophrenic angle compatible with right pleural effusion. Heart size within normal limits. Peripheral skin fold along the left upper chest is the favored cause of the edge in this vicinity, similar findings are present on the prior exam  and left pneumothorax is not favored. IMPRESSION: 1. Progressive consolidation in the right upper lobe and potentially in the superior segment right lower lobe. Well pneumonia is likely, a component of radiation pneumonitis is not totally excluded. This has substantially worsened over the last 5 days. 2. Right pleural effusion. 3. Left infrahilar adenopathy, unchanged. 4. Peripheral skin fold along the left upper chest is the favored cause of the edge in this vicinity, similar findings are present on the prior exam and left pneumothorax is not favored. Electronically Signed   By: Ryan Salvage M.D.   On: 04/14/2024 13:35   DG ESOPHAGUS W SINGLE CM (SOL OR THIN BA) Result Date: 04/11/2024 CLINICAL DATA:  Dysphagia, coughing, painful swallowing. EXAM: ESOPHOGRAM/BARIUM SWALLOW  TECHNIQUE: Single contrast examination was performed using  thin barium. FLUOROSCOPY: Radiation Exposure Index (as provided by the fluoroscopic device): 7.8 mGy Kerma COMPARISON:  CT chest 03/15/2024 FINDINGS: Due to the patient's clinical condition, he was only able to assume the LPO position, all sequences are obtained with the patient LPO. The pharyngeal phase of swallowing is only seen in the oblique projection in passing. There were no episodes of tracheal aspiration. Primary peristaltic waves were intact on 4/4 swallows with only a small amount of proximal escape. Mild distal esophageal fold thickening may reflect mild esophagitis. No appreciable ulceration or overt stricture identified. IMPRESSION: 1. No significant esophageal dysmotility observed. 2. Mild distal esophageal fold thickening may reflect mild esophagitis. No appreciable ulceration or stricture. 3. No frank tracheal aspiration. 4. Entire exam performed with the patient in the LPO position due to the patient's condition. Electronically Signed   By: Ryan Salvage M.D.   On: 04/11/2024 15:27   ADDENDUM: Hematology/Oncology Attending: The patient is seen today.  I agree with the above note.  Unfortunately he is actively dying.  His family were at the bedside and the critical care team are working on extubation and comfort measure.  He has a very aggressive non-small cell lung cancer that did not respond to treatment with Keytruda . He also had recurrent pulmonary embolism. I agree with the comfort care at this point. Thank you for taking good care for Mr. Severs. Sherrod MARLA Sherrod, MD

## 2024-05-10 NOTE — Progress Notes (Signed)
 Peripherally Inserted Central Catheter Placement  The IV Nurse has discussed with the patient and/or persons authorized to consent for the patient, the purpose of this procedure and the potential benefits and risks involved with this procedure.  The benefits include less needle sticks, lab draws from the catheter, and the patient may be discharged home with the catheter. Risks include, but not limited to, infection, bleeding, blood clot (thrombus formation), and puncture of an artery; nerve damage and irregular heartbeat and possibility to perform a PICC exchange if needed/ordered by physician.  Alternatives to this procedure were also discussed.  Bard Power PICC patient education guide, fact sheet on infection prevention and patient information card has been provided to patient /or left at bedside.    PICC Placement Documentation  PICC Double Lumen 05/10/24 Right Brachial 38 cm 0 cm (Active)  Indication for Insertion or Continuance of Line Limited venous access - need for IV therapy >5 days (PICC only) 05/10/24 1525  Exposed Catheter (cm) 0 cm 05/10/24 1525  Site Assessment Clean, Dry, Intact 05/10/24 1525  Lumen #1 Status Flushed;Saline locked;Blood return noted 05/10/24 1525  Lumen #2 Status Flushed;Saline locked;Blood return noted 05/10/24 1525  Dressing Type Transparent;Securing device 05/10/24 1525  Dressing Status Antimicrobial disc/dressing in place;Clean, Dry, Intact 05/10/24 1525  Line Care Connections checked and tightened 05/10/24 1525  Line Adjustment (NICU/IV Team Only) No 05/10/24 1525  Dressing Intervention New dressing;Adhesive placed at insertion site (IV team only) 05/10/24 1525  Dressing Change Due 05/17/24 05/10/24 1525    Wife signed consent at bedside   Renaee Neville Skillern 05/10/2024, 3:38 PM

## 2024-05-10 NOTE — Consult Note (Signed)
 Consultation Note Date: 05/10/2024   Patient Name: Jeff Romero  DOB: 05/22/1961  MRN: [id number]  Age / Sex: 63 y.o., male  PCP: [facility] Referring Physician: Annella Donnice SAUNDERS, MD  Reason for Consultation: Establishing goals of care  HPI/Patient Profile: 63 y.o. male  with past medical history of  admitted on 05/09/2024     Clinical Assessment and Goals of Care: 63 yo gentleman, patient of Dr Sherrod, also follows with my colleague Ms Cousar NP at the cancer center, Metastatic Lung cancer Stage IV on Immunotherapy admitted for Acute Hypoxic Respiratory Failure,New Pulmonary Emboli.  Palliative consult for code status and goals of care.   Palliative medicine is specialized medical care for people living with serious illness. It focuses on providing relief from the symptoms and stress of a serious illness. The goal is to improve quality of life for both the patient and the family. Goals of care: Broad aims of medical therapy in relation to the patient's values and preferences. Our aim is to provide medical care aimed at enabling patients to achieve the goals that matter most to them, given the circumstances of their particular medical situation and their constraints.   Chart reviewed Patient seen and examined.   I met with the patient at bedside, EKG in progress, wife son and daughter at bedside.   Wife is primary historian, patient appears in mild to moderate distress due to resp distress and generalized pain. Wife states that patient is on room air at home, he suddenly became weak and short of breath and she brought him into the hospital.   I shared with her the CT results showing bilateral PE and R heart strain, explained this to the best of my ability, patient did not tolerate BIPAP earlier, currently on O2 [place].   Discussed with bedside RN colleague after visit.    NEXT OF KIN  Wife who  is a resp therapist, son and daughter at bedside.   SUMMARY OF RECOMMENDATIONS   Full Code, Full Scope care for now. Discussed frankly but compassionately with patient and family (wife, son and daughter) at bedside about the serious and critical nature of the patient's illness - metastatic stage IV lung cancer now with bilateral PE and R heart strain. Offered active listening and supportive empathic presence.  Full code for now, PMT to follow along, monitor hospital course and overall disease trajectory of illness.  Thank you for the consult.   Code Status/Advance Care Planning: Full code   Symptom Management:  OxyContin  and PO Dilaudid  at home.  Palliative Prophylaxis:  Frequent Pain Assessment  Additional Recommendations (Limitations, Scope, Preferences): Full Scope Treatment  Psycho-social/Spiritual:  Desire for further Chaplaincy support:yes Additional Recommendations: Caregiving  Support/Resources  Prognosis:  Unable to determine  Discharge Planning: To Be Determined      Primary Diagnoses: Present on Admission:  Acute hypoxic respiratory failure (HCC)   I have reviewed the medical record, interviewed the patient and family, and examined the patient. The following aspects are pertinent.  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   Prostate cancer (HCC)    Social History   Socioeconomic History   Marital status: Married    Spouse name: Not on file   Number of children: Not on file   Years of education: Not on file   Highest education level: Not on file  Occupational History   Not on file  Tobacco Use   Smoking status: Former    Types: E-cigarettes    Quit date: 05/09/2014    Years since quitting: 10.0   Smokeless tobacco: Never   Tobacco comments:    Quit 5 years ago   Vaping Use    Vaping status: Former   Quit date: 12/21/2023  Substance and Sexual Activity   Alcohol  use: Yes    Alcohol /week: 6.0 standard drinks of alcohol     Types: 6 Cans of beer per week    Comment: every other day   Drug use: No   Sexual activity: Not on file  Other Topics Concern   Not on file  Social History Narrative   Not on file   Social Drivers of Health   Financial Resource Strain: Not on file  Food Insecurity: No Food Insecurity (05/10/2024)   Hunger Vital Sign    Worried About Running Out of Food in the Last Year: Never true    Ran Out of Food in the Last Year: Never true  Transportation Needs: No Transportation Needs (05/10/2024)   PRAPARE - Administrator, Civil Service (Medical): No    Lack of Transportation (Non-Medical): No  Physical Activity: Not on file  Stress: Not on file  Social Connections: Not on file   Family History  Problem Relation Age of Onset   Sickle cell anemia Mother    Cirrhosis Mother    Alcohol  abuse Mother    Bone cancer Father    Hypertension Sister    Hypertension Brother    Colon cancer Neg Hx    Esophageal cancer Neg Hx    Rectal cancer Neg Hx    Stomach cancer Neg Hx    Scheduled Meds:  sodium chloride    Intravenous Once   Chlorhexidine  Gluconate Cloth  6 each Topical Daily   insulin  aspart  0-9 Units Subcutaneous Q4H   ipratropium-albuterol   3 mL Nebulization Q4H   Continuous Infusions:  heparin  1,000 Units/hr (05/10/24 0836)   lactated ringers  100 mL/hr at 05/10/24 0836   PRN Meds:.acetaminophen , HYDROmorphone  (DILAUDID ) injection **OR** HYDROmorphone  (DILAUDID ) injection, ondansetron  (ZOFRAN ) IV Medications Prior to Admission:  Prior to Admission medications   Medication Sig Start Date End Date Taking? Authorizing Provider  acetaminophen  (TYLENOL ) 500 MG tablet Take 2 tablets (1,000 mg total) by mouth every 8 (eight) hours. Patient taking differently: Take 1,000 mg by mouth in the morning and at bedtime. 02/15/24  Yes  Ghimire, Donalda HERO, MD  albuterol  (VENTOLIN  HFA) 108 (90 Base) MCG/ACT inhaler Inhale 2 puffs into the lungs every 6 (six) hours as needed for wheezing or shortness of breath. 12/23/23  Yes [provider]  benzonatate  (TESSALON ) 200 MG capsule Take 1 capsule (200 mg total) by mouth 3 (three) times daily. 03/28/24  Yes Pickenpack-Cousar, Fannie SAILOR, NP  desipramine (NORPRAMIN) 25 MG tablet Take 25 mg by mouth at bedtime.  Yes [provider]  enoxaparin  (LOVENOX ) 60 MG/0.6ML injection Inject 0.6 mLs (60 mg total) into the skin 2 (two) times daily. 04/25/24 07/24/24 Yes Heilingoetter, Cassandra L, PA-C  ferrous sulfate 325 (65 FE) MG tablet Take 325 mg by mouth daily with breakfast.   Yes [provider]  gabapentin  (NEURONTIN ) 100 MG capsule Take 1 capsule (100 mg total) by mouth 3 (three) times daily. 05/02/24  Yes Pickenpack-Cousar, Athena N, NP  guaiFENesin -codeine  100-10 MG/5ML syrup Take 10 mLs by mouth every 4 (four) hours as needed for cough. 04/15/24  Yes Will Almarie MATSU, MD  HYDROcodone  bit-homatropine [facility]) 5-1.5 MG/5ML syrup Take 5 mLs by mouth every 8 (eight) hours as needed for cough. 03/28/24  Yes Pickenpack-Cousar, Fannie SAILOR, NP  HYDROmorphone  (DILAUDID ) 2 MG tablet Take 0.5 tablets (1 mg total) by mouth every 4 (four) hours as needed for severe pain (pain score 7-10). Patient taking differently: Take 2 mg by mouth every 4 (four) hours as needed for moderate pain (pain score 4-6) or severe pain (pain score 7-10). 04/20/24  Yes Pickenpack-Cousar, Athena N, NP  megestrol  (MEGACE ) 20 MG tablet Take 20 mg by mouth 4 (four) times daily. 02/21/24  Yes [provider]  metoprolol  succinate (TOPROL  XL) 25 MG 24 hr tablet Take 0.5 tablets (12.5 mg total) by mouth daily. Patient taking differently: Take 25 mg by mouth daily. 04/26/24  Yes Vicci Rollo SAUNDERS, PA-C  ondansetron  (ZOFRAN ) 4 MG tablet Take 4 mg by mouth daily as needed for nausea or vomiting.   Yes  [provider]  ondansetron  (ZOFRAN ) 8 MG tablet Take 0.5 tablets (4 mg total) by mouth every 8 (eight) hours as needed for nausea. Patient taking differently: Take 8 mg by mouth daily as needed for nausea or vomiting. 03/28/24  Yes Pickenpack-Cousar, Fannie SAILOR, NP  OVER THE COUNTER MEDICATION Apply 1 Application topically daily. Topical cream with unknown active ingredient/strength. Sample given by oncologist in office for daily use on radiation burn.   Yes [provider]  oxyCODONE  (OXYCONTIN ) 10 mg 12 hr tablet Take 1 tablet (10 mg total) by mouth every 8 (eight) hours. 04/20/24  Yes Pickenpack-Cousar, Fannie SAILOR, NP  pantoprazole  (PROTONIX ) 40 MG tablet Take 1 tablet (40 mg total) by mouth 2 (two) times daily. Patient taking differently: Take 40 mg by mouth daily. 04/15/24  Yes Will Almarie MATSU, MD  pembrolizumab  (KEYTRUDA ) 100 MG/4ML SOLN Inject 200 mg into the vein every 21 ( twenty-one) days.   Yes [provider]  polyethylene glycol powder (GLYCOLAX /MIRALAX ) 17 GM/SCOOP powder Take 17 g by mouth daily. Patient taking differently: Take 17 g by mouth daily as needed for mild constipation or moderate constipation. 02/15/24  Yes Ghimire, Donalda HERO, MD  senna-docusate (SENOKOT-S) 8.6-50 MG tablet Take 2 tablets by mouth at bedtime. 02/15/24  Yes Ghimire, Donalda HERO, MD  spironolactone  (ALDACTONE ) 25 MG tablet Take 0.5 tablets (12.5 mg total) by mouth daily. 04/26/24 07/25/24 Yes Vicci Rollo SAUNDERS, PA-C   No Known Allergies Review of Systems +shortness of breath +anxiety/air hunger  Physical Exam Awake alert Mild to moderate distress due to dyspnea and generalized pain Monitor noted Muscle wasting and cancer related cachexia evident Oriented Mild tachypnea also evident  Vital Signs: BP (!) 144/101   Pulse (!) 34   Temp 97.7 F (36.5 C) (Axillary)   Resp (!) 46   Ht 5' 7 (1.702 m)   Wt 54.9 kg   SpO2 95%   BMI 18.95 kg/m  Pain  Scale: Not given for  pain   Pain Score: 5    SpO2: SpO2: 95 % O2 Device:SpO2: 95 % O2 Flow Rate: .O2 Flow Rate (L/min): 3 L/min  IO: Intake/output summary:  Intake/Output Summary (Last 24 hours) at 05/10/2024 1035 Last data filed at 05/10/2024 0836 Gross per 24 hour  Intake 1166.81 ml  Output --  Net 1166.81 ml    LBM: Last BM Date :  (pt states between8-10 days without a BM) Baseline Weight: Weight: 54.9 kg Most recent weight: Weight: 54.9 kg     Palliative Assessment/Data:   PPS 30%  Time In: 9 Time Out: 10 Time Total: 60 Greater than 50%  of this time was spent counseling and coordinating care related to the above assessment and plan.  Signed by: Lonia Serve, MD   Please contact Palliative Medicine Team phone at [phone] for questions and concerns.  For individual provider: See Tracey

## 2024-05-10 NOTE — Progress Notes (Signed)
   05/10/24 1625  Spiritual Encounters  Type of Visit Initial  Care provided to: Pt and family  Reason for visit Urgent spiritual support   I visited briefly with wife, Jeff Romero, at bedside with Jeff Romero, and also at bedside two of their adult children (or child and partner). I asked Jeff Romero if she had any needs. She indicated their [name] faith outlook and asked for a prayer. I affirmed their faith and their presence together. I prayed with the family and let them know that a chaplain was available over night, to simply let the care team know of any needs. [name] L. Fredrica, M.Div [phone]

## 2024-05-10 NOTE — ED Provider Notes (Signed)
.  Ultrasound ED Peripheral IV (Provider)  Date/Time: 05/10/2024 12:48 AM  Performed by: Albertina Dixon, MD Authorized by: Albertina Dixon, MD   Procedure details:    Indications: multiple failed IV attempts     Skin Prep: chlorhexidine  gluconate     Location:  Right forearm   Angiocath:  20 G   Bedside Ultrasound Guided: Yes     Images: not archived     Patient tolerated procedure without complications: Yes     Dressing applied: [place] Albertina Dixon, MD 05/10/24 [id number]

## 2024-05-10 NOTE — Progress Notes (Signed)
   05/10/24 2200  Spiritual Encounters  Type of Visit Follow up  Care provided to: Pt and family  Conversation partners present during encounter Nurse;Physician  Referral source Chaplain assessment;Chaplain team;Family  Reason for visit End-of-life  OnCall Visit Yes  Spiritual Framework  Presenting Themes Courage hope and growth;Rituals and practive;Meaning/purpose/sources of inspiration  Community/Connection Family  Strengths Surrounded by 13 family members  Family Stress Factors Exhausted;Loss of control;Major life changes  Interventions  Spiritual Care Interventions Made Prayer;Encouragement;Supported grief process;Provided grief education;Narrative/life review  Intervention Outcomes  Outcomes Awareness around self/spiritual resourses;Reduced anxiety;Reduced fear  Spiritual Care Plan  Spiritual Care Issues Still Outstanding Chaplain will continue to follow   Called to bedside and assisted staff in allowing extended family to visit and say good bye.  Provided spiritual counsel and comfort at this time.

## 2024-05-10 NOTE — Progress Notes (Addendum)
 PHARMACY - ANTICOAGULATION CONSULT NOTE  Pharmacy Consult for Heparin  Indication: pulmonary embolus  No Known Allergies  Patient Measurements: Height: 5' 7 (170.2 cm) Weight: 54.9 kg (121 lb) IBW/kg (Calculated) : 66.1 HEPARIN  DW (KG): 54.9  Vital Signs: Temp: 98 F (36.7 C) (08/06 0038) Temp Source: Axillary (08/05 2209) BP: 90/65 (08/06 0500) Pulse Rate: 117 (08/06 0520)  Labs: Recent Labs    05/09/24 2145 05/09/24 2330 05/10/24 0340  HGB 7.8*  --  5.2*  HCT 27.2*  --  19.2*  PLT 326  --  252  APTT  --  37*  --   HEPARINUNFRC  --  <0.10*  --   CREATININE 0.64  --  0.81  TROPONINIHS 233* 845* 823*    Estimated Creatinine Clearance: 72.5 mL/min (by C-G formula based on SCr of 0.81 mg/dL).   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  PMH is significant for stage IV lung cancer (currently on immunotherapy), PE on Lovenox  after Eliquis  failure, Rt EJ thrombosis, esophagitis. Pharmacy is consulted to dose heparin  while awaiting CTa to assess for new PE.    Today, 05/09/2024: Heparin  level < 0.1, subtherapeutic on heparin  1000 units/hr  - Due at 06:00, collected by phlebotomy at 07:30, in process, resulted 10:51 - Per RN, heparin  level drawn from hand with heparin  infusing in pIV in [facility]  - Note previously therapeutic on heparin  1400 units/hr with aPTT 69 on 03/17/24 CBC:  Hgb decreased overnight from 7.8 on admit to 5.2 (baseline anemia, Hgb ~7s).  Plt WNL - MD ordered 2 units PRBC, awaiting transfusion - No  bleeding or complications reported by RN.      Goal of Therapy:  Heparin  level 0.3-0.7 units/ml Monitor platelets by anticoagulation protocol: Yes   Plan:  Give heparin  1500 units bolus IV x 1 Increase to heparin  IV infusion at 1300 units/hr Heparin  level in 6 hours after rate change Daily heparin  level and CBC Monitor for s/s bleeding, f/u procedural plans.    Wanda Hasting PharmD, BCPS [facility] main pharmacy [phone] 05/10/2024 7:40 AM  Addendum: 05/10/2024 12:03 PM IR pulmonary arteriogram with thrombolytic therapy - unilateral alteplase  infusion Continue heparin  infusion as above. Heparin  level q6h and fibrinogen  q6h (notify MD if < 150)  Wanda Hasting PharmD, BCPS [facility] main pharmacy [phone] 05/10/2024 12:04 PM

## 2024-05-10 NOTE — Procedures (Signed)
 Intubation Procedure Note  Jeff Romero  [id number]  04-22-1961  Date:05/10/24  Time:5:35 PM   Provider Performing:Lanorris Kalisz R Lester Platas    Procedure: Intubation (31500)  Indication(s) Respiratory Failure  Consent Risks of the procedure as well as the alternatives and risks of each were explained to the patient and/or caregiver.  Consent for the procedure was obtained and is signed in the bedside chart   Anesthesia Versed , Fentanyl , and Rocuronium    Time Out Verified patient identification, verified procedure, site/side was marked, verified correct patient position, special equipment/implants available, medications/allergies/relevant history reviewed, required imaging and test results available.   Sterile Technique Usual hand hygeine, masks, and gloves were used   Procedure Description Patient positioned in bed supine.  Sedation given as noted above.  Patient was intubated with endotracheal tube using Glidescope.  View was Grade 1 full glottis .  Number of attempts was 1.  Colorimetric CO2 detector was consistent with tracheal placement.   Complications/Tolerance None; patient tolerated the procedure well. Chest X-ray is ordered to verify placement.   EBL none   Specimen(s) None

## 2024-05-10 NOTE — ED Notes (Signed)
 Patient transported to CT

## 2024-05-10 NOTE — Progress Notes (Signed)
 PCCM progress note:  Agitation noted throughout the day.  Tachypnea in the setting of significant metabolic acidosis. At around 1645 I was called urgently to the bedside with decreased mentation.  When I arrived he was not arousable.  He was having haggard breathing.  Frankly he appeared to be actively dying.  We confirmed a pulse.  His initial blood pressure on walk in the room was hypertensive.  Subsequent checks were low.  tPA and heparin  infusing were stopped as bleed was on the differential.  Low-dose Precedex  was also stopped.  Phenylephrine  via pushes was administered with minimal improvement.  2 Amps of bicarb, 1 amp of D50 administered.  Norepinephrine  was started at 20 mcg.  Blood pressure initially improved.  He remained unresponsive.  Saturations were okay on heated high flow nasal cannula.  He was increased to 100% to preoxygenated in anticipation of intubation.  After stabilization of blood pressure he was intubated.  Initially tried without medications but unable to pry mouth open.  He was given 50 of fentanyl  and 2 of midazolam .  He was intubated without paralytic.  After tube was placed with ease, he was fighting being bagged so rocuronium  was administered.  Repeat blood pressures demonstrated hypotension although not as severe.  Norepinephrine  was increased to 30.  Vasopressin  was added.  Repeat labs sent.  Have asked RT to place radial arterial line given significant hypotension as well as for serial blood gases.  His abdomen is not distended but is a bit tense, firm.  Changed from prior.  If can stabilize from blood pressure standpoint will send down for noncontrasted scans to evaluate for any changes and signs of blood accumulation in areas that it should not.  Ultimately, I suspect decompensation is related to progressive acidosis, lactic acidosis in the setting of massive versus submassive pulmonary embolism leading to a pH too low to maintain blood pressure.  Of course bleed and sepsis  are also on the differential but is felt less likely.  I suspect his right ventricle is failing and thus he has not developing cardiogenic shock as evidenced by significant lactic acidemia.  Unfortunately, I suspect this will worsen with positive pressure ventilation now intubated.  I shared my concerns in detail with family at bedside.  Will try to stabilize and see what we can do.  I am not optimistic.  I said if things continue to worsen beyond current status, we would have a conversation about what next steps should be.  Additional critical care time independent of procedures: 35 minutes

## 2024-05-10 NOTE — Progress Notes (Addendum)
 Aline attempted x2 per 2 Rts all attempts were unsuccessful.No complications.RN and MD aware. Abg drawn. Aline on hold per md.

## 2024-05-10 NOTE — Progress Notes (Signed)
   05/10/24 1700  Spiritual Encounters  Type of Visit Follow up  Care provided to: Pt and family  Conversation partners present during encounter Nurse  Referral source Chaplain team  Reason for visit End-of-life  OnCall Visit Yes  Spiritual Framework  Presenting Themes Meaning/purpose/sources of inspiration;Significant life change;Values and beliefs;Coping tools;Courage hope and growth;Rituals and practive  Values/beliefs Christian beleif  Community/Connection Family  Strengths Surrounded by family  Patient Stress Factors None identified  Family Stress Factors Exhausted;Loss;Loss of control;Major life changes  Interventions  Spiritual Care Interventions Made Established relationship of care and support;Compassionate presence;Reflective listening;Normalization of emotions;Meaning making;Narrative/life review;Prayer;Bereavement/grief support  Intervention Outcomes  Outcomes Connection to spiritual care;Reduced isolation;Patient family open to resources  Spiritual Care Plan  Spiritual Care Issues Still Outstanding Chaplain will continue to follow  Follow up plan  Address Anticipatory Grief   Met with family at hallway and bedside - end of life issues and themes discussed and provided support and comfort . Prayed at bedside and anointed patient and family with prayer.  Addressed their Anticipatory gief.

## 2024-05-10 NOTE — Sedation Documentation (Signed)
 Patient transported from IR back to ICU with respiratory staff. No changes in status. I stat obtained per floor RN.

## 2024-05-10 NOTE — Progress Notes (Signed)
   05/10/24 1019  TOC Brief Assessment  Insurance and Status Reviewed  Patient has primary care physician Yes  Home environment has been reviewed home with wife  Prior level of function: Independent  Prior/Current Home Services No current home services  Social Drivers of Health Review SDOH reviewed no interventions necessary  Readmission risk has been reviewed Yes  Transition of care needs no transition of care needs at this time

## 2024-05-10 NOTE — Plan of Care (Signed)
  Problem: Metabolic: Goal: Ability to maintain appropriate glucose levels will improve Outcome: Progressing   Problem: Skin Integrity: Goal: Risk for impaired skin integrity will decrease Outcome: Progressing   Problem: Tissue Perfusion: Goal: Adequacy of tissue perfusion will improve Outcome: Progressing   Problem: Clinical Measurements: Goal: Cardiovascular complication will be avoided Outcome: Progressing   Problem: Safety: Goal: Ability to remain free from injury will improve Outcome: Progressing   Problem: Clinical Measurements: Goal: Respiratory complications will improve Outcome: Not Progressing

## 2024-05-10 NOTE — Progress Notes (Addendum)
 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  Per wife who is giving the history.  The patient was admitted to [facility] in April when they discovered his lung cancer.  He has been losing weight as well with poor appetite, Wife says his appetite has improved last couple days secondary to Marijuana laced butter to help boost his appetite.    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 rate (!) 31, height 5' 7 (1.702 m), weight 54.9 kg, SpO2 97%.    FiO2 (%):  [30 %] 30 %   Intake/Output Summary (Last 24 hours) at 05/10/2024 9247 Last data filed at 05/10/2024 9476 Gross per 24 hour  Intake 818.39 ml  Output --  Net 818.39 ml   Filed Weights   05/09/24 2119  Weight: 54.9 kg    Examination: General: acute on chronic cachetic adult male, lying in icu bed, working to breathe  HEENT: Normocephalic, PERRLA intact, poor dentition, pale mucous membranes, Pink MM CV: s1,s2,  RRR, no MRG, No JVD  pulm: clear, diminished, labored breathing   Abs: bs active, soft  Extremities: 1 + pitting, LE edema, no deformity, moves all extremities on command  Skin: no rash  Neuro: Rass 0 to -1, alert to self, place, situation/time disoriented to  GU: male pure wick    Resolved problem list   Assessment and Plan  Acute Hypoxic Respiratory Failure New Pulmonary Emboli Metastatic Lung cancer Stage IV on Immunotherapy Cxr showing right lung atelectasis, last CT chest 6 days ago showing significant cancerous mass with significant atelectasis of right lung Positive for acute bilateral pulmonary emboli with CT evidence for right heart strain (RV/LV Ratio = 1.6 ) consistent with at least submassive (intermediate risk) PE.  Known mediastinal and right hilar adenopathy is better seen on the 07/31 Troponin 253>>845>>823, BNP 133.8 suspect due to B/E PE  P: Continue heparin  gtt for now at this time, h/h is 5.2- 2 units of Blood ordered  No signs of overt bleeding.  Discussed GOC with wife at beside- currently wanting every to be done  High risk of intubation, discussed with wife that we are at max of what we can provide for him, concerned if patient goes on life support will not be able to come off. Recommend focusing on comfort-quality of life measures, DNR/DNI Obtian ABG  If patient unable to tolerate BIPAP, and patient continues to have increased work of breathing will need to intubate  Obtain ECHO, Vas ultrasound, Continue serial CBCS  Anemia of chronic disease vs Bleed?  H/o esophageal ulcer  Hgb 9.9>>7.8>>5.2 P: Type in screen in process  No signs of overt bleeding at this time  2 units of Blood pending   Constipation  - according to report, patient has not had any BM in the last 10 days  P: Keep NPO for now  Limit narcotics if possible  Will obtain Abdominal x-ray to evaluate stool burden- patient does not have any pain or distention of abdomen on exam    Best  Practice (right click and Reselect all SmartList Selections daily)   Diet/type: NPO DVT prophylaxis systemic heparin  Pressure ulcer(s): N/A GI prophylaxis: H2B Lines: N/A Foley:  N/A Code Status:  full code   Labs   CBC: Recent Labs  Lab 05/09/24 2145 05/10/24 0340  WBC 16.8* 15.1*  NEUTROABS 14.9*  --   HGB 7.8* 5.2*  HCT 27.2* 19.2*  MCV 89.2 92.3  PLT 326 252    Basic Metabolic Panel: Recent Labs  Lab 05/09/24 2145 05/10/24 0340  NA 138 140  K 3.7 4.0  CL 101 101  CO2 20* 17*  GLUCOSE 130* 104*  BUN 12 15  CREATININE 0.64 0.81  CALCIUM  8.4* 8.2*  MG  --  1.9  PHOS  --  3.7   GFR: Estimated Creatinine Clearance: 72.5 mL/min (by C-G formula based on SCr of 0.81 mg/dL). Recent Labs  Lab 05/09/24 2145 05/10/24 0340  WBC 16.8* 15.1*    Liver Function Tests: No results for input(s): AST, ALT, ALKPHOS, BILITOT, PROT, ALBUMIN  in the last 168 hours. No results for input(s): LIPASE, AMYLASE in the last 168 hours. No results for input(s): AMMONIA in the last 168 hours.  ABG    Component Value Date/Time   HCO3 18.6 (L) 05/10/2024 0340   ACIDBASEDEF 7.0 (H) 05/10/2024 0340   O2SAT 35 05/10/2024 0340     Coagulation Profile: No results for input(s): INR, PROTIME in the last 168 hours.  Cardiac Enzymes: No results for input(s): CKTOTAL, CKMB, CKMBINDEX, TROPONINI in the last 168 hours.  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   Surgical History:   Past Surgical History:  Procedure Laterality Date   BRONCHIAL BIOPSY  01/21/2024    Procedure: BRONCHOSCOPY, WITH BIOPSY;  Surgeon: Gretta Leita SQUIBB, DO;  Location: [facility];  Service: Cardiopulmonary;;   BRONCHIAL BRUSHINGS  01/21/2024   Procedure: BRONCHOSCOPY, WITH BRUSH BIOPSY;  Surgeon: Gretta Leita SQUIBB, DO;  Location: [facility];  Service: Cardiopulmonary;;   ESOPHAGOGASTRODUODENOSCOPY N/A 04/12/2024   Procedure: EGD (ESOPHAGOGASTRODUODENOSCOPY);  Surgeon: Wilhelmenia Aloha Raddle., MD;  Location: [facility];  Service: Gastroenterology;  Laterality: N/A;   FLEXIBLE BRONCHOSCOPY N/A 01/21/2024   Procedure: BRONCHOSCOPY, FLEXIBLE;  Surgeon: Gretta Leita SQUIBB, DO;  Location: [facility];  Service: Cardiopulmonary;  Laterality: N/A;   INGUINAL HERNIA REPAIR Right 09/18/2021   Procedure: OPEN RIGHT INGUINAL HERNIA REPAIR WITH MESH;  Surgeon: Kinsinger, Herlene Righter, MD;  Location: [facility];  Service: General;  Laterality: Right;   IR THORACENTESIS ASP PLEURAL SPACE W/IMG GUIDE  03/21/2024   KNEE CARTILAGE SURGERY Right    PROSTATE SURGERY  ROTATOR CUFF REPAIR Right      Social History:   reports that he quit smoking about 10 years ago. His smoking use included e-cigarettes. He has never used smokeless tobacco. He reports current alcohol  use of about 6.0 standard drinks of alcohol  per week. He reports that he does not use drugs.   Family History:  His family history includes Alcohol  abuse in his mother; Bone cancer in his father; Cirrhosis in his mother; Hypertension in his brother and sister; Sickle cell anemia in his mother. There is no history of Colon cancer, Esophageal cancer, Rectal cancer, or Stomach cancer.   Allergies No Known Allergies   Home Medications  Prior to Admission medications   Medication Sig Start Date End Date Taking? Authorizing Provider  acetaminophen  (TYLENOL ) 500 MG tablet Take 2 tablets (1,000 mg total) by mouth every 8 (eight) hours. 02/15/24   Ghimire, Donalda HERO, MD  albuterol  (VENTOLIN  HFA) 108 (90 Base) MCG/ACT inhaler Inhale 1 puff into the  lungs every 6 (six) hours as needed for wheezing or shortness of breath. 12/23/23   [provider]  benzonatate  (TESSALON ) 200 MG capsule Take 1 capsule (200 mg total) by mouth 3 (three) times daily. 03/28/24   Pickenpack-Cousar, Athena N, NP  enoxaparin  (LOVENOX ) 60 MG/0.6ML injection Inject 0.6 mLs (60 mg total) into the skin 2 (two) times daily. 04/25/24 07/24/24  Heilingoetter, Cassandra L, PA-C  ferrous sulfate 325 (65 FE) MG tablet Take 325 mg by mouth daily with breakfast.    [provider]  gabapentin  (NEURONTIN ) 100 MG capsule Take 1 capsule (100 mg total) by mouth 3 (three) times daily. 05/02/24   Pickenpack-Cousar, Athena N, NP  guaiFENesin -codeine  100-10 MG/5ML syrup Take 10 mLs by mouth every 4 (four) hours as needed for cough. 04/15/24   Will Almarie MATSU, MD  HYDROcodone  bit-homatropine [facility]) 5-1.5 MG/5ML syrup Take 5 mLs by mouth every 8 (eight) hours as needed for cough. 03/28/24   Pickenpack-Cousar, Athena N, NP  HYDROmorphone  (DILAUDID ) 2 MG tablet Take 0.5 tablets (1 mg total) by mouth every 4 (four) hours as needed for severe pain (pain score 7-10). Patient taking differently: Take 1 mg by mouth in the morning, at noon, in the evening, and at bedtime. 04/20/24   Pickenpack-Cousar, Athena N, NP  megestrol  (MEGACE ) 20 MG tablet Take 40 mg by mouth 4 (four) times daily -  with meals and at bedtime. 02/21/24   [provider]  metoprolol  succinate (TOPROL  XL) 25 MG 24 hr tablet Take 0.5 tablets (12.5 mg total) by mouth daily. 04/26/24   Vicci Rollo SAUNDERS, PA-C  ondansetron  (ZOFRAN ) 8 MG tablet Take 0.5 tablets (4 mg total) by mouth every 8 (eight) hours as needed for nausea. 03/28/24   Pickenpack-Cousar, Fannie SAILOR, NP  OVER THE COUNTER MEDICATION Apply 1 Application topically daily. Topical cream with unknown active ingredient/strength. Sample given by oncologist in office for daily use on radiation burn.    [provider]  oxyCODONE  (OXYCONTIN ) 10  mg 12 hr tablet Take 1 tablet (10 mg total) by mouth every 8 (eight) hours. 04/20/24   Pickenpack-Cousar, Athena N, NP  pantoprazole  (PROTONIX ) 40 MG tablet Take 1 tablet (40 mg total) by mouth 2 (two) times daily. 04/15/24   Will Almarie MATSU, MD  pembrolizumab  (KEYTRUDA ) 100 MG/4ML SOLN Inject 200 mg into the vein every 21 ( twenty-one) days.    [provider]  polyethylene glycol powder (GLYCOLAX /MIRALAX ) 17 GM/SCOOP powder Take 17 g by mouth daily. 02/15/24  Ghimire, Donalda HERO, MD  senna-docusate (SENOKOT-S) 8.6-50 MG tablet Take 2 tablets by mouth at bedtime. 02/15/24   Ghimire, Donalda HERO, MD  spironolactone  (ALDACTONE ) 25 MG tablet Take 0.5 tablets (12.5 mg total) by mouth daily. 04/26/24 07/25/24  Vicci Rollo SAUNDERS, PA-C     Critical care time: 40 mins    Christian Teirra Carapia AGACNP-BC   [facility] 05/10/2024, 8:24 AM  Please see [email or web address] for pager details.  From 7A-7P if no response, please call [phone]. After hours, please call ELink [phone].

## 2024-05-10 NOTE — Procedures (Signed)
 Vascular and Interventional Radiology Procedure Note  Patient: Jeff Romero DOB: 1961/06/27 Medical Record Number: [id number] Note Date/Time: 05/10/24 12:59 PM   Performing Physician: Thom Hall, MD Assistant(s): None  Diagnosis: High risk PE. + cardiac markers. Pulmonary HTN w R heart dysfunction.  Procedure:   PULMONARY ARTERIOGRAPHY THROMBOLYSIS, Catheter-directed   Anesthesia: Conscious Sedation Complications: None Estimated Blood Loss: Minimal Specimens: None  Findings:  - access via the RIGHT femoral vein. - Pulmonary arterial pressures, 45/20 [27 mean PAP] - PE burden at L lower segmental pulmonary ateries, with successful unilateral (LEFT) lytic catheter placement(s).  - LEFT Uni-Fuse ; 15 cm infusion and 90 cm total length catheter   Plan: - infusion catheter will receive 1 mg/hr tPA, x 12 hrs, for a total of 12 mg tPA over 12 hrs - q6h H/h, fibrinogen , and heparin  levels - the sheaths will receive each 20 mL/hr of NS gtt for KVO - NPO vs Clear liquid diet recommended - Pharmacy [facility] team) consult for heparin  level management.  - Page PCCM and VIR On Call if fibrinogen  <150, severe HA, AMS, or new / acute GIB - Bedrest with RLE straight x 12 hrs, while the catheters are in place.  - VIR service to follow and will assess catheter function for removal on rounds in AM.  Final report to follow once all images are reviewed and compared with previous studies.  See detailed dictation with images in PACS. The patient tolerated the procedure well without incident or complication and was returned to ICU in critical condition.    Thom Hall, MD [facility]   Pager. [phone] Clinic. [phone]

## 2024-05-10 NOTE — Progress Notes (Signed)
 Placed patient on Servo air, Currently on HHFNC 25L 35% FIO2. WOB has increased. SPO2 99%, patient is currently tolerating at this time. RT to con't to monitor and adjust as needed.

## 2024-05-10 NOTE — Sedation Documentation (Signed)
 MAIN PAP   45/20 (27)

## 2024-05-10 NOTE — Progress Notes (Addendum)
 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   Patient is tachypenic and tachycardic.Bipap in place, precedex  infusion.Results with minor electrolyte disturbances, trop uptrending hemoglobin drop to 7.8, radiograph and ct reviewed  eICU Interventions  Maintain BiPAP therapy for now, utilize Precedex  for anxiolysis.  Cultures pending.  Doxycycline .  Holding analgesics in the setting of medication induced constipation  DVT prophylaxis with systemic anticoagulation GI prophylaxis not indicated   0253 - Add LE vascular US , dilaudid  Sliding scale, patient's wife is insistent that he needs pain meds in addition to precedex , patient attempting to have BM now  0506 -hemoglobin 5.2, maintain heparin  drip for now.  Type and screen, transfuse 2 units PRBC.  Check other coagulation studies.  Trend hemoglobin every 6 hours.  Intervention Category Evaluation Type: New Patient Evaluation  Nazire Fruth 05/10/2024, 1:58 AM

## 2024-05-10 NOTE — Progress Notes (Addendum)
 eLink Physician-Brief Progress Note Patient Name: Jeff Romero DOB: 1960/11/05 MRN: [id number]   Date of Service  05/10/2024  HPI/Events of Note  Patient with stage IV lung cancer with diffuse metastasis, pulmonary embolus previously on Lovenox  who presented with submassive pulmonary embolus now requiring thrombolytics and shock management  Called about persistent hypoxia-waveform is somewhat questionable and recent gas with 99% saturation.  Additionally, blood pressures are reading maps in the 50s despite 2 pressors.  Recent pH is 7.07.  Bicarb drip pending.  Hemoglobin 5.2.  eICU Interventions  2 units of additional blood products ordered Continue norepinephrine /vasopressin /epinephrine  Bicarb infusion    2100 -delay in obtaining blood products given suspected transfusion reaction.  Adjusted pressors for systolic greater than 100.  Will request ground team evaluation for arterial line  2132 -patient remains hypotensive, will administer 2 ampoules of bicarb in addition.  Reiterated to the family that we are on maximal support and unfortunately the patient seems to be declining regardless and we are anticipating cardiac arrest in the coming minutes to hours.  They would like to pursue continued full care.  Discussed with staff at bedside.  2300 - Additional dose of bicarb. Maximal therapy otherwise. Grim prognosis  Intervention Category Intermediate Interventions: Hypotension - evaluation and management  [name] 05/10/2024, 8:22 PM

## 2024-05-10 NOTE — Progress Notes (Addendum)
 Progress Note  Repeat hemoglobin 7.1 after 1 unit PRBC.  Given no further decline in hemoglobin and in fact good response to 1 unit, instructed RN to resume tPA infusion for catheter directed lytics as well as heparin .  Both to treat PE.  Reviewed ABG.  pH less than 7.1.  Will start bicarb drip.  Increase respiratory rate from 24 to 28.  Barely was emptying at RR 24.  Oxygenation okay.  Repeat hemoglobin now at 5.5.  Will pause tPA and heparin  again.  Will redraw to see if the value over 7 or 5.5 is more accurate.  If remains in the fives we will continue off tPA and heparin .  If more stable at 7 we will resume.  CT chest abdomen pelvis without contrast ordered to evaluate for possible occult hematoma

## 2024-05-10 NOTE — Consult Note (Signed)
 Chief Complaint: Bilateral pulmonary embolism with right heart strain/metastatic lung cancer - referred for pulmonary arteriogram with thrombolytic therapy   Referring Provider(s): Hunsucker,M  Supervising Physician: Hughes Simmonds  Patient Status: [facility] - In-pt  History of Present Illness: Jeff Romero is a 63 y.o. male with history of anxiety, arthritis, cataract, depression, right lung cancer, pulmonary embolism on Lovenox  twice daily, and prostate cancer.  Patient presented to the emergency department 05/09/2024 for shortness of breath.  Upon emergency department evaluation, CT angiography chest was obtained revealing bilateral pulmonary emboli with CT evidence of heart strain.  Patient was subsequently admitted for further diagnostic workup and treatment.  Interventional radiology was consulted for possible pulmonary artery thrombectomy or thrombolytic therapy.  Imaging was reviewed and approved by Dr. Hughes 05/10/2024 for pulmonary arteriogram and thrombolytic therapy.   Patient is Full Code  Past Medical History:  Diagnosis Date   Allergy    Anxiety    Arthritis    Blood transfusion without reported diagnosis    Cataract    Depression    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   Prostate cancer [facility])     Past Surgical History:  Procedure Laterality Date   BRONCHIAL BIOPSY  01/21/2024   Procedure: BRONCHOSCOPY, WITH BIOPSY;  Surgeon: Gretta Leita SQUIBB, DO;  Location: [facility];  Service: Cardiopulmonary;;   BRONCHIAL BRUSHINGS  01/21/2024   Procedure: BRONCHOSCOPY, WITH BRUSH BIOPSY;  Surgeon: Gretta Leita SQUIBB, DO;  Location: [facility];  Service: Cardiopulmonary;;   ESOPHAGOGASTRODUODENOSCOPY N/A 04/12/2024   Procedure: EGD (ESOPHAGOGASTRODUODENOSCOPY);  Surgeon: Wilhelmenia Aloha Raddle., MD;  Location: [facility];  Service: Gastroenterology;  Laterality: N/A;   FLEXIBLE BRONCHOSCOPY N/A 01/21/2024   Procedure: BRONCHOSCOPY, FLEXIBLE;  Surgeon:  Gretta Leita SQUIBB, DO;  Location: [facility];  Service: Cardiopulmonary;  Laterality: N/A;   INGUINAL HERNIA REPAIR Right 09/18/2021   Procedure: OPEN RIGHT INGUINAL HERNIA REPAIR WITH MESH;  Surgeon: Kinsinger, Herlene Righter, MD;  Location: [facility];  Service: General;  Laterality: Right;   IR THORACENTESIS ASP PLEURAL SPACE W/IMG GUIDE  03/21/2024   KNEE CARTILAGE SURGERY Right    PROSTATE SURGERY     ROTATOR CUFF REPAIR Right     Allergies: Patient has no known allergies.  Medications: Prior to Admission medications   Medication Sig Start Date End Date Taking? Authorizing Provider  acetaminophen  (TYLENOL ) 500 MG tablet Take 2 tablets (1,000 mg total) by mouth every 8 (eight) hours. Patient taking differently: Take 1,000 mg by mouth in the morning and at bedtime. 02/15/24  Yes Ghimire, Donalda HERO, MD  albuterol  (VENTOLIN  HFA) 108 (90 Base) MCG/ACT inhaler Inhale 2 puffs into the lungs every 6 (six) hours as needed for wheezing or shortness of breath. 12/23/23  Yes [provider]  benzonatate  (TESSALON ) 200 MG capsule Take 1 capsule (200 mg total) by mouth 3 (three) times daily. 03/28/24  Yes Pickenpack-Cousar, Fannie SAILOR, NP  desipramine (NORPRAMIN) 25 MG tablet Take 25 mg by mouth at bedtime.   Yes [provider]  enoxaparin  (LOVENOX ) 60 MG/0.6ML injection Inject 0.6 mLs (60 mg total) into the skin 2 (two) times daily. 04/25/24 07/24/24 Yes Heilingoetter, Cassandra L, PA-C  ferrous sulfate 325 (65 FE) MG tablet Take 325 mg by mouth daily with breakfast.   Yes [provider]  gabapentin  (NEURONTIN ) 100 MG capsule Take 1 capsule (100 mg total) by mouth 3 (three) times daily. 05/02/24  Yes Pickenpack-Cousar, Athena N, NP  guaiFENesin -codeine  100-10 MG/5ML syrup Take  10 mLs by mouth every 4 (four) hours as needed for cough. 04/15/24  Yes Will Almarie MATSU, MD  HYDROcodone  bit-homatropine [facility]) 5-1.5 MG/5ML syrup Take 5 mLs by mouth every 8 (eight) hours as needed for  cough. 03/28/24  Yes Pickenpack-Cousar, Fannie SAILOR, NP  HYDROmorphone  (DILAUDID ) 2 MG tablet Take 0.5 tablets (1 mg total) by mouth every 4 (four) hours as needed for severe pain (pain score 7-10). Patient taking differently: Take 2 mg by mouth every 4 (four) hours as needed for moderate pain (pain score 4-6) or severe pain (pain score 7-10). 04/20/24  Yes Pickenpack-Cousar, Athena N, NP  megestrol  (MEGACE ) 20 MG tablet Take 20 mg by mouth 4 (four) times daily. 02/21/24  Yes [provider]  metoprolol  succinate (TOPROL  XL) 25 MG 24 hr tablet Take 0.5 tablets (12.5 mg total) by mouth daily. Patient taking differently: Take 25 mg by mouth daily. 04/26/24  Yes Vicci Rollo SAUNDERS, PA-C  ondansetron  (ZOFRAN ) 4 MG tablet Take 4 mg by mouth daily as needed for nausea or vomiting.   Yes [provider]  ondansetron  (ZOFRAN ) 8 MG tablet Take 0.5 tablets (4 mg total) by mouth every 8 (eight) hours as needed for nausea. Patient taking differently: Take 8 mg by mouth daily as needed for nausea or vomiting. 03/28/24  Yes Pickenpack-Cousar, Fannie SAILOR, NP  OVER THE COUNTER MEDICATION Apply 1 Application topically daily. Topical cream with unknown active ingredient/strength. Sample given by oncologist in office for daily use on radiation burn.   Yes [provider]  oxyCODONE  (OXYCONTIN ) 10 mg 12 hr tablet Take 1 tablet (10 mg total) by mouth every 8 (eight) hours. 04/20/24  Yes Pickenpack-Cousar, Fannie SAILOR, NP  pantoprazole  (PROTONIX ) 40 MG tablet Take 1 tablet (40 mg total) by mouth 2 (two) times daily. Patient taking differently: Take 40 mg by mouth daily. 04/15/24  Yes Will Almarie MATSU, MD  pembrolizumab  (KEYTRUDA ) 100 MG/4ML SOLN Inject 200 mg into the vein every 21 ( twenty-one) days.   Yes [provider]  polyethylene glycol powder (GLYCOLAX /MIRALAX ) 17 GM/SCOOP powder Take 17 g by mouth daily. Patient taking differently: Take 17 g by mouth daily as needed for mild constipation  or moderate constipation. 02/15/24  Yes Ghimire, Donalda HERO, MD  senna-docusate (SENOKOT-S) 8.6-50 MG tablet Take 2 tablets by mouth at bedtime. 02/15/24  Yes Ghimire, Donalda HERO, MD  spironolactone  (ALDACTONE ) 25 MG tablet Take 0.5 tablets (12.5 mg total) by mouth daily. 04/26/24 07/25/24 Yes Vicci Rollo SAUNDERS, PA-C     Family History  Problem Relation Age of Onset   Sickle cell anemia Mother    Cirrhosis Mother    Alcohol  abuse Mother    Bone cancer Father    Hypertension Sister    Hypertension Brother    Colon cancer Neg Hx    Esophageal cancer Neg Hx    Rectal cancer Neg Hx    Stomach cancer Neg Hx     Social History   Socioeconomic History   Marital status: Married    Spouse name: Not on file   Number of children: Not on file   Years of education: Not on file   Highest education level: Not on file  Occupational History   Not on file  Tobacco Use   Smoking status: Former    Types: E-cigarettes    Quit date: 05/09/2014    Years since quitting: 10.0   Smokeless tobacco: Never   Tobacco comments:    Quit 5 years ago  Vaping Use   Vaping status: Former   Quit date: 12/21/2023  Substance and Sexual Activity   Alcohol  use: Yes    Alcohol /week: 6.0 standard drinks of alcohol     Types: 6 Cans of beer per week    Comment: every other day   Drug use: No   Sexual activity: Not on file  Other Topics Concern   Not on file  Social History Narrative   Not on file   Social Drivers of Health   Financial Resource Strain: Not on file  Food Insecurity: No Food Insecurity (05/10/2024)   Hunger Vital Sign    Worried About Running Out of Food in the Last Year: Never true    Ran Out of Food in the Last Year: Never true  Transportation Needs: No Transportation Needs (05/10/2024)   PRAPARE - Administrator, Civil Service (Medical): No    Lack of Transportation (Non-Medical): No  Physical Activity: Not on file  Stress: Not on file  Social Connections: Not on file        Review of Systems; denies fever, HA, CP, cough, abd/back pain,N/V or bleeding; he does have sig dyspnea/tachypnea  Vital Signs: BP 127/80   Pulse (!) 115   Temp (!) 96.7 F (35.9 C)   Resp (!) 38   Ht 5' 7 (1.702 m)   Wt 121 lb (54.9 kg)   SpO2 99%   BMI 18.95 kg/m   Advance Care Plan: no documents on file  PE: frail/cachectic male with resp distress; chest- dim BS bilat/rhonchi; heart- tachy but reg rhythm; abd-soft,+BS,NT; 1+ pretibial edema bilat  Imaging: CT Angio Chest PE W and/or Wo Contrast Result Date: 05/10/2024 CLINICAL DATA:  Worsening dyspnea malignancy EXAM: CT ANGIOGRAPHY CHEST WITH CONTRAST TECHNIQUE: Multidetector CT imaging of the chest was performed using the standard protocol during bolus administration of intravenous contrast. Multiplanar CT image reconstructions and MIPs were obtained to evaluate the vascular anatomy. RADIATION DOSE REDUCTION: This exam was performed according to the departmental dose-optimization program which includes automated exposure control, adjustment of the mA and/or kV according to patient size and/or use of iterative reconstruction technique. CONTRAST:  75mL OMNIPAQUE  IOHEXOL  350 MG/ML SOLN COMPARISON:  Chest x-ray 05/09/2024, CT 05/04/2024 FINDINGS: Cardiovascular: Satisfactory opacification of the pulmonary arteries to the segmental level. Multiple small acute right lower lobe segmental/subsegmental emboli. Left upper lobe small segmental and subsegmental emboli. Multiple left lower lobe segmental and subsegmental PE. Positive for right heart strain with RV LV ratio of 1.6. The heart appears slightly enlarged. Reflux of contrast into the hepatic veins. No significant pericardial effusion. Mild aortic atherosclerosis. Narrowed appearing upper superior vena cava. Irregular foci of airspace disease in the right superior lower lobe Mediastinum/Nodes: Midline trachea. Supraclavicular adenopathy difficult to see due to streak artifact and  paucity of fat. Known mediastinal and right hilar adenopathy is better seen on the 07/31 exam likely due to phase of imaging. Lungs/Pleura: Right hilar mass with narrowing of the bronchi. Masslike consolidation throughout the right upper lobe as seen on recent imaging. Underlying emphysema. Irregular foci of disease in the right lower lobe without recent significant change. Similar sized moderate right pleural effusion. Suspect areas of necrosis within the right upper lobe consolidation. Upper Abdomen: No acute finding Musculoskeletal: The previously measured suspected right anterior chest wall lesion is also more difficult to see due to phase of enhancement. Review of the MIP images confirms the above findings. IMPRESSION: 1. Positive for acute bilateral pulmonary emboli with CT evidence  for right heart strain (RV/LV Ratio = 1.6 ) consistent with at least submassive (intermediate risk) PE. The presence of right heart strain has been associated with an increased risk of morbidity and mortality. Please refer to the PE Focused order set in EPIC. 2. Redemonstrated large right hilar mass and masslike consolidation in the right upper lobe and infrahilar region, grossly similar as compared with the recent CT. Similar sized moderate right pleural effusion. Overall the findings are more difficult to measure/characterize compared to the CT from 07/31, likely due to phase of enhancement. 3. Known mediastinal, right hilar and supraclavicular adenopathy is better seen on the 07/31 exam likely due to phase of imaging. 4. Aortic atherosclerosis. Critical Value/emergent results were called by telephone at the time of interpretation on 05/10/2024 at 2:16 am to provider Dr. Albertina , who verbally acknowledged these results. Aortic Atherosclerosis (ICD10-I70.0) and Emphysema (ICD10-J43.9). Electronically Signed   By: Luke Bun M.D.   On: 05/10/2024 02:19   CT CHEST ABDOMEN PELVIS W CONTRAST Result Date: 05/09/2024 CLINICAL DATA:   Right upper lobe neoplasm. Metastatic disease evaluation. EXAM: CT CHEST, ABDOMEN, AND PELVIS WITH CONTRAST TECHNIQUE: Multidetector CT imaging of the chest, abdomen and pelvis was performed following the standard protocol during bolus administration of intravenous contrast. RADIATION DOSE REDUCTION: This exam was performed according to the departmental dose-optimization program which includes automated exposure control, adjustment of the mA and/or kV according to patient size and/or use of iterative reconstruction technique. CONTRAST:  80mL OMNIPAQUE  IOHEXOL  300 MG/ML  SOLN COMPARISON:  CTA chest studies 03/15/2024 and 02/12/2024, and PET-CT 02/01/2024. FINDINGS: CT CHEST FINDINGS Cardiovascular: The cardiac size is normal. There is a small anterior pericardial effusion not seen previously. There is atherosclerosis in the aortic arch and proximal great vessels without aneurysm, stenosis or dissection. There is central bronchovascular encasement on the right due to hilar adenopathy. The right superior pulmonary vein is markedly narrowed by extrinsic compression. Other central vessels are only mildly narrowed. The pulmonary arteries are normal caliber. There is a small right lower lobe segmental embolus, not seen previously on 2:36 No other arterial embolic disease is seen on this nondedicated exam. Mediastinum/Nodes: There is right supraclavicular adenopathy, today is less well seen due to interval weight loss and decreased body fat. There is a lymph node adjacent the right lobe of thyroid  gland in this area measuring 1.3 cm short axis, was previously 1.9 cm. There is a right superior hilar mass, more difficult to see due to increased adjacent lung consolidation, appears to measure 3.7 x 3.1 cm on 2:28, previously 3.0 x 2.8 cm. There is a right mid hilar nodal mass today measuring 3.4 x 2.6 cm on 2:35, was previously 3.1 x 2.1 cm. Low right paratracheal nodal mass is 2.5 x 2.6 cm on 2:25, previously 2.6 x 2.6 cm. A  subcarinal lymph node is 1.4 cm short axis, unchanged. There is a left hilar lymph node measuring 1.4 cm in short axis, previously 1.9 cm. Negative thyroid  gland, thoracic trachea, thoracic esophagus and axillary spaces. Lungs/Pleura: Moderate right pleural effusion, again tracks into the apex where it may be partially loculated. The fluid is similar in volume to the last CT with minimal left pleural effusion new in the interval. Paraseptal and small bullous emphysematous changes are noted in the upper lobes. A centrally necrotic peripherally enhancing right upper lobe mass measures 4.6 x 3.6 cm on 2:25, previously 5.0 x 4.0 cm. There is interval increased peritumoral dense airspace consolidation in the right upper  lobe, small amount of perihilar airspace disease right middle lobe probably related to neck strain is compression of the bronchus intermedius by adenopathy. The distal right main bronchus and right lower lobe basal subsegmental bronchi are also mildly narrowed by this. There is a small amount of airspace disease in the superior segment of right lower lobe. There is a 7 mm left lower lobe nodule on 7:104, suspected metastatic and was larger previously measuring 1.6 cm. Linear atelectatic changes are noted in the left upper lobe, posterior lower lobes. No other visible nodules. Musculoskeletal: There is a heterogeneous mass straddling the pleural space in the right chest wall in the anteromedial first intercostal space. Today this measures 4.5 x 2.8 cm on 2:22, was previously 3.5 x 2.3 cm. There is associated destructive lytic change of the underlying anteromedial second rib, also new. No other regional metastatic bone abnormality is seen. CT ABDOMEN PELVIS FINDINGS Hepatobiliary: In segment 4A there are 2 new irregularly rim enhancing hypodense masses consistent with necrotic metastases, measuring 5.2 x 4.5 cm on 2:51, and further down near liver hilum a larger similar mass is 7.1 x 5.1 cm on 2:59. In  segment 5 there is an enhancing mass with a greater solid component measuring 2.0 x 2.1 cm on 2:82, and in segment 3 there is a rim enhancing metastasis measuring 2.7 x 2.0 cm on 2:83. There is a stable 2.2 cm cyst in segment 5. No other focal mass enhancement. The liver is enlarged measuring 23.5 cm length. Gallbladder and bile ducts are unremarkable. Pancreas: No abnormality. Spleen: No abnormality. Adrenals/Urinary Tract: There is no adrenal or renal mass enhancement. No urinary stone or obstruction. The bladder contracted and not well seen. Stomach/Bowel: Negative for dilatation or wall thickening, including the appendix. Mild-to-moderate fecal stasis. Vascular/Lymphatic: There is heavy for age aortoiliac calcific plaque without aneurysm. Enlarged lymph nodes noted anterior to the abdominal aorta, largest 1.4 cm short axis. No further adenopathy. Reproductive: No prostatomegaly. Both testicles are in the scrotal sac. Other: Increased body wall anasarca with interval weight loss and decreased body fat stores. Findings consistent with cachexia. There is mild free ascites. There is haziness in the mesentery probably related to malnutrition or hepatic dysfunction. Musculoskeletal: There are progressive bone metastases compared with the PET-CT. There previously was a subcentimeter lytic lesion in the right iliac wing which has enlarged and destroyed the overlying bone with protrusion into the overlying and underlying pelvic musculature. This measures 4.9 x 3.1 cm on 2:99. There is a new lytic lesion in the right ischial tuberosity measuring 2.1 x 1.8 cm on 2:119, a new lytic lesion in the posterior left ilium measuring 1.9 x 1.9 cm on 2:101, and a new lytic destructive lesion is present at S1 anteriorly eccentric to the left and measures 4.3 x 2.7 cm. I do not see any lesions in the lumbar vertebrae. IMPRESSION: 1. Interval progression of musculoskeletal metastatic disease. 2. Right upper lobe mass is slightly  smaller but there is interval increased peritumoral airspace consolidation in the right upper lobe, and a small amount of airspace disease in the superior segment of the right lower lobe and in the perihilar right middle lobe. 3. Right hilar adenopathy slightly larger. Mediastinal adenopathy basically stable. 4. Right supraclavicular adenopathy is less well seen due to interval weight loss and decreased body fat stores. May have slightly improved. 5. New small right lower lobe segmental arterial embolus. No findings of right heart strain. No other arterial embolus on this nondedicated exam. 6.  New small left pleural effusion. Moderate right pleural fluid seems unchanged. 7. Left lower lobe nodule metastasis is smaller. 8. New liver metastases.  Progressive bone metastases. 9. Increased body wall anasarca with interval weight loss and decreased body fat stores. 10. Mild free ascites. 11. Aortic and coronary artery atherosclerosis. 12. Emphysema. Aortic Atherosclerosis (ICD10-I70.0) and Emphysema (ICD10-J43.9). Electronically Signed   By: Francis Quam M.D.   On: 05/09/2024 23:43   DG Chest Portable 1 View Result Date: 05/09/2024 CLINICAL DATA:  shortness of breath.  Lung cancer EXAM: PORTABLE CHEST 1 VIEW COMPARISON:  CT chest abdomen pelvis 05/04/2024 FINDINGS: The heart and mediastinal contours are within normal limits. Right upper lobe complete opacification with apical bullous changes consistent with known underlying malignancy. Left apical bullous changes. No pulmonary edema. Persistent trace to small right pleural effusion. No pneumothorax. No acute osseous abnormality. IMPRESSION: 1. Right upper lobe complete opacification with apical bullous changes consistent with known underlying malignancy. Associated postobstructive atelectasis with possibly superimposed infection. Finding better evaluated on CT chest 05/04/2024. 2. Persistent trace to small right pleural effusion 3. No acute cardiopulmonary  abnormality. Electronically Signed   By: Morgane  Naveau M.D.   On: 05/09/2024 22:10   MR Brain W Wo Contrast Result Date: 05/05/2024 CLINICAL DATA:  Provided history: Malignant neoplasm of unspecified part of unspecified bronchus or lung. Non-small cell lung cancer, staging. EXAM: MRI HEAD WITHOUT AND WITH CONTRAST TECHNIQUE: Multiplanar, multiecho pulse sequences of the brain and surrounding structures were obtained without and with intravenous contrast. CONTRAST:  6mL GADAVIST  GADOBUTROL  1 MMOL/ML IV SOLN COMPARISON:  Brain MRI 02/01/2024. FINDINGS: Brain: No age-advanced or lobar predominant cerebral atrophy. Subcentimeter focus of diffusion-weighted signal abnormality within the right thalamus without associated enhancement. The subcentimeter focus of diffusion-weighted signal abnormality demonstrated within the left frontal lobe on the prior MRI of 02/01/2024 is no longer present, and this likely reflected an acute/subacute infarct on the prior study. No chronic intracranial blood products. No extra-axial fluid collection. No midline shift. No pathologic intracranial enhancement identified. Vascular: Maintained flow voids within the proximal large arterial vessels. Skull and upper cervical spine: No focal worrisome marrow lesion. Incompletely assessed cervical spondylosis. Sinuses/Orbits: No mass or acute finding within the imaged orbits. No significant paranasal sinus disease. Impression #1 will be called to the ordering clinician or representative by the Radiologist Assistant, and communication documented in the PACS or [employer]. IMPRESSION: 1. Subcentimeter focus of non-enhancing diffusion-weighted signal abnormality within the right thalamus. This likely reflects an acute/subacute infarct (rather than a small metastasis). However, a short-interval follow-up brain MRI (with and without contrast) is recommended in 6-8 weeks for surveillance. 2. The small focus of diffusion-weighted signal  abnormality present within the left frontal lobe on the prior MRI of 02/01/2024 is no longer present, and this likely reflected an acute/subacute infarct on the prior study. Electronically Signed   By: Rockey Childs D.O.   On: 05/05/2024 09:55   DG Chest 1 View Result Date: 04/14/2024 CLINICAL DATA:  Cough.  Lung cancer. EXAM: CHEST  1 VIEW COMPARISON:  04/09/2024 FINDINGS: Progressive consolidation in the right upper lobe and potentially in the superior segment right lower lobe. Stable pleural thickening at the right lung apex. Left infrahilar fullness attributable to adenopathy, unchanged. Blunting of the right costophrenic angle compatible with right pleural effusion. Heart size within normal limits. Peripheral skin fold along the left upper chest is the favored cause of the edge in this vicinity, similar findings are present on the prior exam  and left pneumothorax is not favored. IMPRESSION: 1. Progressive consolidation in the right upper lobe and potentially in the superior segment right lower lobe. Well pneumonia is likely, a component of radiation pneumonitis is not totally excluded. This has substantially worsened over the last 5 days. 2. Right pleural effusion. 3. Left infrahilar adenopathy, unchanged. 4. Peripheral skin fold along the left upper chest is the favored cause of the edge in this vicinity, similar findings are present on the prior exam and left pneumothorax is not favored. Electronically Signed   By: Ryan Salvage M.D.   On: 04/14/2024 13:35   DG ESOPHAGUS W SINGLE CM (SOL OR THIN BA) Result Date: 04/11/2024 CLINICAL DATA:  Dysphagia, coughing, painful swallowing. EXAM: ESOPHOGRAM/BARIUM SWALLOW TECHNIQUE: Single contrast examination was performed using  thin barium. FLUOROSCOPY: Radiation Exposure Index (as provided by the fluoroscopic device): 7.8 mGy Kerma COMPARISON:  CT chest 03/15/2024 FINDINGS: Due to the patient's clinical condition, he was only able to assume the LPO position,  all sequences are obtained with the patient LPO. The pharyngeal phase of swallowing is only seen in the oblique projection in passing. There were no episodes of tracheal aspiration. Primary peristaltic waves were intact on 4/4 swallows with only a small amount of proximal escape. Mild distal esophageal fold thickening may reflect mild esophagitis. No appreciable ulceration or overt stricture identified. IMPRESSION: 1. No significant esophageal dysmotility observed. 2. Mild distal esophageal fold thickening may reflect mild esophagitis. No appreciable ulceration or stricture. 3. No frank tracheal aspiration. 4. Entire exam performed with the patient in the LPO position due to the patient's condition. Electronically Signed   By: Ryan Salvage M.D.   On: 04/11/2024 15:27    Labs:  CBC: Recent Labs    04/25/24 0755 05/02/24 0957 05/09/24 2145 05/10/24 0340 05/10/24 0729 05/10/24 0842  WBC 12.1* 13.3* 16.8* 15.1*  --   --   HGB 6.5* 9.9* 7.8* 5.2*  --  5.4*  HCT 21.0* 31.9* 27.2* 19.2*  --  16.0*  PLT 410* 392 326 252 281  --     COAGS: Recent Labs    03/16/24 0449 03/16/24 1257 03/17/24 0430 03/17/24 1044 05/09/24 2330 05/10/24 0729  INR 1.5*  --   --   --   --  1.8*  APTT 62*   < > 69* 63* 37* 74*   < > = values in this interval not displayed.    BMP: Recent Labs    04/25/24 0755 05/02/24 0957 05/09/24 2145 05/10/24 0340 05/10/24 0842  NA 136 137 138 140 136  K 3.6 3.6 3.7 4.0 4.5  CL 102 101 101 101  --   CO2 25 26 20* 17*  --   GLUCOSE 94 133* 130* 104*  --   BUN 10 11 12 15   --   CALCIUM  7.8* 8.5* 8.4* 8.2*  --   CREATININE 0.58* 0.73 0.64 0.81  --   GFRNONAA >60 >60 >60 >60  --     LIVER FUNCTION TESTS: Recent Labs    04/14/24 0529 04/15/24 0732 04/25/24 0755 05/02/24 0957  BILITOT 1.0 0.7 0.5 0.7  AST 112* 97* 22 16  ALT 101* 95* 21 16  ALKPHOS 265* 355* 206* 186*  PROT 5.4* 5.1* 6.2* 6.7  ALBUMIN  <1.5* <1.5* 2.3* 2.4*    TUMOR MARKERS: No  results for input(s): AFPTM, CEA, CA199, CHROMGRNA in the last 8760 hours.  Assessment and Plan:  Patient is with met lung cancer, bilateral pulmonary embolism/right heart strain scheduled  for pulmonary arteriogram and thrombolytic therapy 05/10/2024.  Reviewed and approved by Dr. Hughes.  PESI Score: 143 Class V, Very High Risk: 10.0-24.5% 30 day mortality   Pertinent Positives impacting PESI      - Male Sex (+10 points)      - Hx of Cancer (+30 points)      - Heart Rate >110 (+20 points)      - Respiratory Rate >30 (+20 points)  Thrombectomy and thrombolytics were considered.    Risks and benefits of bilateral pulmonary arteriogram and thrombolytic therapy were discussed with the patient/family including, but not limited to bleeding, infection, vascular injury or contrast induced renal failure.  This interventional procedure involves the use of X-rays and because of the nature of the planned procedure, it is possible that we will have prolonged use of X-ray fluoroscopy.  Potential radiation risks to you include (but are not limited to) the following: - A slightly elevated risk for cancer  several years later in life. This risk is typically less than 0.5% percent. This risk is low in comparison to the normal incidence of human cancer, which is 33% for women and 50% for men according to the [employer]. - Radiation induced injury can include skin redness, resembling a rash, tissue breakdown / ulcers and hair loss (which can be temporary or permanent).   The likelihood of either of these occurring depends on the difficulty of the procedure and whether you are sensitive to radiation due to previous procedures, disease, or genetic conditions.   IF your procedure requires a prolonged use of radiation, you will be notified and given written instructions for further action.  It is your responsibility to monitor the irradiated area for the 2 weeks following the procedure and  to notify your physician if you are concerned that you have suffered a radiation induced injury.    All of the patient's questions were answered, patient is agreeable to proceed.  Consent signed and in chart.  Thank you for allowing our service to participate in Dres[place] Lozito 's care.  Electronically Signed: Lavanda JAYSON Jurist, PA-C Gabino Tsugio Elison,PA-C  05/10/2024, 11:33 AM    I spent a total of 40 Minutes    in face to face in clinical consultation, greater than 50% of which was counseling/coordinating care for bilateral pulmonary arteriogram and thrombolytic therapy.

## 2024-05-10 NOTE — Progress Notes (Signed)
 Transported patient on SERVO_AIR to IR. SPO2 97%, increased WOB did not increased, WOB is in the 30's during IR procedure and during the transport back to CCU.

## 2024-05-11 ENCOUNTER — Other Ambulatory Visit

## 2024-05-11 ENCOUNTER — Inpatient Hospital Stay (HOSPITAL_COMMUNITY)

## 2024-05-11 DIAGNOSIS — I2781 Cor pulmonale (chronic): Secondary | ICD-10-CM | POA: Diagnosis present

## 2024-05-11 DIAGNOSIS — J9601 Acute respiratory failure with hypoxia: Secondary | ICD-10-CM | POA: Diagnosis not present

## 2024-05-11 DIAGNOSIS — D649 Anemia, unspecified: Secondary | ICD-10-CM

## 2024-05-11 DIAGNOSIS — I2699 Other pulmonary embolism without acute cor pulmonale: Secondary | ICD-10-CM | POA: Diagnosis present

## 2024-05-11 DIAGNOSIS — R579 Shock, unspecified: Secondary | ICD-10-CM | POA: Diagnosis not present

## 2024-05-11 DIAGNOSIS — L8990 Pressure ulcer of unspecified site, unspecified stage: Secondary | ICD-10-CM | POA: Diagnosis present

## 2024-05-11 DIAGNOSIS — K7200 Acute and subacute hepatic failure without coma: Secondary | ICD-10-CM

## 2024-05-11 DIAGNOSIS — Z86711 Personal history of pulmonary embolism: Secondary | ICD-10-CM | POA: Diagnosis not present

## 2024-05-11 DIAGNOSIS — E8729 Other acidosis: Secondary | ICD-10-CM

## 2024-05-11 DIAGNOSIS — R578 Other shock: Secondary | ICD-10-CM | POA: Diagnosis not present

## 2024-05-11 DIAGNOSIS — I509 Heart failure, unspecified: Secondary | ICD-10-CM

## 2024-05-11 DIAGNOSIS — K7290 Hepatic failure, unspecified without coma: Secondary | ICD-10-CM | POA: Diagnosis not present

## 2024-05-11 LAB — CBC
HCT: 28 % — ABNORMAL LOW (ref 39.0–52.0)
Hemoglobin: 7.9 g/dL — ABNORMAL LOW (ref 13.0–17.0)
MCH: 27.6 pg (ref 26.0–34.0)
MCHC: 28.2 g/dL — ABNORMAL LOW (ref 30.0–36.0)
MCV: 97.9 fL (ref 80.0–100.0)
Platelets: 19 K/uL — CL (ref 150–400)
RBC: 2.86 MIL/uL — ABNORMAL LOW (ref 4.22–5.81)
RDW: 17.4 % — ABNORMAL HIGH (ref 11.5–15.5)
WBC: 17.5 K/uL — ABNORMAL HIGH (ref 4.0–10.5)
nRBC: 4.4 % — ABNORMAL HIGH (ref 0.0–0.2)

## 2024-05-11 LAB — COMPREHENSIVE METABOLIC PANEL WITH GFR
ALT: 1835 U/L — ABNORMAL HIGH (ref 0–44)
ALT: 2029 U/L — ABNORMAL HIGH (ref 0–44)
AST: >7000 U/L — ABNORMAL HIGH (ref 15–41)
AST: >7000 U/L — ABNORMAL HIGH (ref 15–41)
Albumin: <1.5 g/dL — ABNORMAL LOW (ref 3.5–5.0)
Albumin: <1.5 g/dL — ABNORMAL LOW (ref 3.5–5.0)
Alkaline Phosphatase: 292 U/L — ABNORMAL HIGH (ref 38–126)
Alkaline Phosphatase: 416 U/L — ABNORMAL HIGH (ref 38–126)
Anion gap: 34 — ABNORMAL HIGH (ref 5–15)
Anion gap: 36 — ABNORMAL HIGH (ref 5–15)
BUN: 29 mg/dL — ABNORMAL HIGH (ref 8–23)
BUN: 33 mg/dL — ABNORMAL HIGH (ref 8–23)
CO2: 11 mmol/L — ABNORMAL LOW (ref 22–32)
CO2: 10 mmol/L — ABNORMAL LOW (ref 22–32)
Calcium: 6.7 mg/dL — ABNORMAL LOW (ref 8.9–10.3)
Calcium: 6.4 mg/dL — CL (ref 8.9–10.3)
Chloride: 99 mmol/L (ref 98–111)
Chloride: 96 mmol/L — ABNORMAL LOW (ref 98–111)
Creatinine, Ser: 2.01 mg/dL — ABNORMAL HIGH (ref 0.61–1.24)
Creatinine, Ser: 2.51 mg/dL — ABNORMAL HIGH (ref 0.61–1.24)
GFR, Estimated: 37 mL/min — ABNORMAL LOW (ref >60)
GFR, Estimated: 28 mL/min — ABNORMAL LOW (ref >60)
Glucose, Bld: 238 mg/dL — ABNORMAL HIGH (ref 70–99)
Glucose, Bld: 179 mg/dL — ABNORMAL HIGH (ref 70–99)
Potassium: 5.1 mmol/L (ref 3.5–5.1)
Potassium: 6 mmol/L — ABNORMAL HIGH (ref 3.5–5.1)
Sodium: 144 mmol/L (ref 135–145)
Sodium: 142 mmol/L (ref 135–145)
Total Bilirubin: 1.8 mg/dL — ABNORMAL HIGH (ref 0.0–1.2)
Total Bilirubin: 1.7 mg/dL — ABNORMAL HIGH (ref 0.0–1.2)
Total Protein: 4.5 g/dL — ABNORMAL LOW (ref 6.5–8.1)
Total Protein: 4.1 g/dL — ABNORMAL LOW (ref 6.5–8.1)

## 2024-05-11 LAB — CBC WITH DIFFERENTIAL/PLATELET
Abs Granulocyte: 10.6 K/uL — ABNORMAL HIGH (ref 1.5–6.5)
Abs Immature Granulocytes: 0.82 K/uL — ABNORMAL HIGH (ref 0.00–0.07)
Basophils Absolute: 0 K/uL (ref 0.0–0.1)
Basophils Relative: 0 %
Eosinophils Absolute: 0 K/uL (ref 0.0–0.5)
Eosinophils Relative: 0 %
HCT: 27.8 % — ABNORMAL LOW (ref 39.0–52.0)
Hemoglobin: 7.6 g/dL — ABNORMAL LOW (ref 13.0–17.0)
Immature Granulocytes: 7 %
Lymphocytes Relative: 3 %
Lymphs Abs: 0.4 K/uL — ABNORMAL LOW (ref 0.7–4.0)
MCH: 26.8 pg (ref 26.0–34.0)
MCHC: 27.3 g/dL — ABNORMAL LOW (ref 30.0–36.0)
MCV: 97.9 fL (ref 80.0–100.0)
Monocytes Absolute: 0.3 K/uL (ref 0.1–1.0)
Monocytes Relative: 3 %
Neutro Abs: 10.6 K/uL — ABNORMAL HIGH (ref 1.7–7.7)
Neutrophils Relative %: 87 %
Platelets: 24 K/uL — CL (ref 150–400)
RBC: 2.84 MIL/uL — ABNORMAL LOW (ref 4.22–5.81)
RDW: 17.2 % — ABNORMAL HIGH (ref 11.5–15.5)
Smear Review: NORMAL
WBC: 12.2 K/uL — ABNORMAL HIGH (ref 4.0–10.5)
nRBC: 3.6 % — ABNORMAL HIGH (ref 0.0–0.2)

## 2024-05-11 LAB — BLOOD GAS, ARTERIAL
Acid-base deficit: 20.2 mmol/L — ABNORMAL HIGH (ref 0.0–2.0)
Acid-base deficit: 19.7 mmol/L — ABNORMAL HIGH (ref 0.0–2.0)
Bicarbonate: 9.4 mmol/L — ABNORMAL LOW (ref 20.0–28.0)
Bicarbonate: 9.5 mmol/L — ABNORMAL LOW (ref 20.0–28.0)
Drawn by: 33147
FIO2: 100 %
FIO2: 80 %
MECHVT: 520 mL
Map: 88 cmH2O
O2 Saturation: 97.4 %
O2 Saturation: 96.9 %
PEEP: 5 cmH2O
PEEP: 5 cmH2O
Patient temperature: 36.4
Patient temperature: 37
RATE: 28 {breaths}/min
RATE: 28 {breaths}/min
pCO2 arterial: 33 mmHg (ref 32–48)
pCO2 arterial: 35 mmHg (ref 32–48)
pH, Arterial: 7.06 — CL (ref 7.35–7.45)
pH, Arterial: 7.04 — CL (ref 7.35–7.45)
pO2, Arterial: 226 mmHg — ABNORMAL HIGH (ref 83–108)
pO2, Arterial: 84 mmHg (ref 83–108)

## 2024-05-11 LAB — ECHOCARDIOGRAM LIMITED
Calc EF: 26.9 %
Height: 67 in
S' Lateral: 2.8 cm
Single Plane A2C EF: 42.8 %
Single Plane A4C EF: 18.4 %
Weight: 1936 [oz_av]

## 2024-05-11 LAB — GLUCOSE, CAPILLARY
Glucose-Capillary: 115 mg/dL — ABNORMAL HIGH (ref 70–99)
Glucose-Capillary: 91 mg/dL (ref 70–99)
Glucose-Capillary: 79 mg/dL (ref 70–99)
Glucose-Capillary: 46 mg/dL — ABNORMAL LOW (ref 70–99)
Glucose-Capillary: 77 mg/dL (ref 70–99)
Glucose-Capillary: 98 mg/dL (ref 70–99)

## 2024-05-11 LAB — LACTIC ACID, PLASMA
Lactic Acid, Venous: >9 mmol/L (ref 0.5–1.9)
Lactic Acid, Venous: >9 mmol/L (ref 0.5–1.9)

## 2024-05-11 LAB — HEMOGLOBIN A1C
Hgb A1c MFr Bld: 5.6 % (ref 4.8–5.6)
Mean Plasma Glucose: 114 mg/dL

## 2024-05-11 LAB — HEPARIN LEVEL (UNFRACTIONATED): Heparin Unfractionated: <0.1 [IU]/mL — ABNORMAL LOW (ref 0.30–0.70)

## 2024-05-11 LAB — FIBRINOGEN: Fibrinogen: <60 mg/dL — CL (ref 210–475)

## 2024-05-11 LAB — MAGNESIUM: Magnesium: 2.3 mg/dL (ref 1.7–2.4)

## 2024-05-11 MED ORDER — SODIUM CHLORIDE 0.9% IV SOLUTION: Freq: Once | INTRAVENOUS | Status: AC

## 2024-05-11 MED ORDER — DEXTROSE 50 % IV SOLN
INTRAVENOUS | Status: AC
  Filled 2024-05-11: qty 50

## 2024-05-11 MED ORDER — DEXTROSE 50 % IV SOLN
25.0000 g | Freq: Once | INTRAVENOUS | Status: AC
  Administered 2024-05-11: 25 g via INTRAVENOUS

## 2024-05-11 MED ORDER — POLYVINYL ALCOHOL 1.4 % OP SOLN
1.0000 [drp] | OPHTHALMIC | Status: DC | PRN
  Administered 2024-05-11: 1 [drp] via OPHTHALMIC
  Filled 2024-05-11: qty 15

## 2024-05-12 LAB — BPAM RBC
Blood Product Expiration Date: 202509012359
Blood Product Expiration Date: 202509012359
Blood Product Expiration Date: 202509022359
Blood Product Unit Number: 202509022359
ISSUE DATE / TIME: 202508061047
ISSUE DATE / TIME: 202508061629
ISSUE DATE / TIME: 202508070005
PRODUCT CODE: 202508070245
PRODUCT CODE: 202509022359
Unit Type and Rh: 202509022359
Unit Type and Rh: 6200
Unit Type and Rh: 6200
Unit Type and Rh: 6200
Unit Type and Rh: 6200
Unit Type and Rh: 6200

## 2024-05-12 LAB — TYPE AND SCREEN
ABO/RH(D): A POS
Antibody Screen: POSITIVE
Donor AG Type: NEGATIVE
Donor AG Type: NEGATIVE
Donor AG Type: NEGATIVE
Donor AG Type: NEGATIVE
Unit division: 0
Unit division: 0
Unit division: 0
Unit division: 0

## 2024-05-12 LAB — BPAM PLATELET PHERESIS
Blood Product Expiration Date: 202508112359
Blood Product Expiration Date: 202508112359
Blood Product Expiration Date: 202508112359
ISSUE DATE / TIME: 202508071532
Unit Type and Rh: 5100
Unit Type and Rh: 5100
Unit Type and Rh: 6200

## 2024-05-12 LAB — PREPARE PLATELET PHERESIS
Unit division: 0
Unit division: 0
Unit division: 0

## 2024-05-12 NOTE — Plan of Care (Signed)
 Patient expired at 20:58  Problem: Education: Goal: Ability to describe self-care measures that may prevent or decrease complications (Diabetes Survival Skills Education) will improve Outcome: Completed/Met Goal: Individualized Educational Video(s) Outcome: Completed/Met   Problem: Coping: Goal: Ability to adjust to condition or change in health will improve Outcome: Completed/Met   Problem: Fluid Volume: Goal: Ability to maintain a balanced intake and output will improve Outcome: Completed/Met   Problem: Health Behavior/Discharge Planning: Goal: Ability to identify and utilize available resources and services will improve Outcome: Completed/Met Goal: Ability to manage health-related needs will improve Outcome: Completed/Met   Problem: Metabolic: Goal: Ability to maintain appropriate glucose levels will improve Outcome: Completed/Met   Problem: Nutritional: Goal: Maintenance of adequate nutrition will improve Outcome: Completed/Met Goal: Progress toward achieving an optimal weight will improve Outcome: Completed/Met   Problem: Skin Integrity: Goal: Risk for impaired skin integrity will decrease Outcome: Completed/Met   Problem: Tissue Perfusion: Goal: Adequacy of tissue perfusion will improve Outcome: Completed/Met   Problem: Education: Goal: Knowledge of General Education information will improve Description: Including pain rating scale, medication(s)/side effects and non-pharmacologic comfort measures Outcome: Completed/Met   Problem: Health Behavior/Discharge Planning: Goal: Ability to manage health-related needs will improve Outcome: Completed/Met   Problem: Clinical Measurements: Goal: Ability to maintain clinical measurements within normal limits will improve Outcome: Completed/Met Goal: Will remain free from infection Outcome: Completed/Met Goal: Diagnostic test results will improve Outcome: Completed/Met Goal: Respiratory complications will  improve Outcome: Completed/Met Goal: Cardiovascular complication will be avoided Outcome: Completed/Met   Problem: Activity: Goal: Risk for activity intolerance will decrease Outcome: Completed/Met   Problem: Nutrition: Goal: Adequate nutrition will be maintained Outcome: Completed/Met   Problem: Coping: Goal: Level of anxiety will decrease Outcome: Completed/Met   Problem: Elimination: Goal: Will not experience complications related to bowel motility Outcome: Completed/Met Goal: Will not experience complications related to urinary retention Outcome: Completed/Met   Problem: Pain Managment: Goal: General experience of comfort will improve and/or be controlled Outcome: Completed/Met   Problem: Safety: Goal: Ability to remain free from injury will improve Outcome: Completed/Met   Problem: Skin Integrity: Goal: Risk for impaired skin integrity will decrease Outcome: Completed/Met   Problem: Activity: Goal: Ability to tolerate increased activity will improve Outcome: Completed/Met   Problem: Respiratory: Goal: Ability to maintain a clear airway and adequate ventilation will improve Outcome: Completed/Met   Problem: Role Relationship: Goal: Method of communication will improve Outcome: Completed/Met

## 2024-05-15 ENCOUNTER — Encounter (HOSPITAL_COMMUNITY): Payer: Self-pay | Admitting: Pulmonary Disease

## 2024-05-15 ENCOUNTER — Inpatient Hospital Stay

## 2024-05-15 ENCOUNTER — Inpatient Hospital Stay: Admitting: Internal Medicine

## 2024-05-15 ENCOUNTER — Encounter (HOSPITAL_COMMUNITY): Payer: Self-pay | Admitting: Radiology

## 2024-05-17 LAB — TRANSFUSION REACTION
DAT C3: NEGATIVE
Post RXN DAT IgG: NEGATIVE

## 2024-05-18 ENCOUNTER — Other Ambulatory Visit (HOSPITAL_COMMUNITY): Payer: Self-pay

## 2024-05-23 ENCOUNTER — Encounter (HOSPITAL_COMMUNITY)

## 2024-05-24 ENCOUNTER — Other Ambulatory Visit

## 2024-06-01 NOTE — Discharge Summary (Signed)
 DEATH SUMMARY   Patient Details  Name: Jeff Romero MRN: [id number] DOB: [birth year]  Admission/Discharge Information   Admit Date:  05/08/2024  Date of Death: Date of Death: May 13, 2024  Time of Death: Time of Death: 2057-06-07  Length of Stay: 2  Referring Physician: [facility]   Reason(s) for Hospitalization  Submassive pulmonary embolism  Diagnoses  Preliminary cause of death: right ventricular failure Secondary Diagnoses (including complications and co-morbidities):  Principal Problem:   Acute hypoxic respiratory failure (HCC) Active Problems:   Malignant neoplasm of upper lobe of right lung (HCC)   Non-small cell cancer of right lung (HCC)   Protein-calorie malnutrition, severe   Pressure injury of skin   Pulmonary embolism (HCC)   Cor pulmonale (HCC)   Obstructive cardiovascular shock (HCC)   Hepatic failure [facility])   Brief Hospital Course (including significant findings, care, treatment, and services provided and events leading to death)  Jeff Romero is a 63 y.o. year old male who has past medical history of lung cancer and biventricular failure presents with submassive PE admitted to the ICU with significant oxygen requirement.  Unfortunately, TTE demonstrated significant RV dysfunction, acute on chronic.  He had significant hypoxemia.  Not a candidate for systemic lytics given he was normotensive.  And with malignancy.   IR was consulted and underwent catheter to lytics on the left, avoided the right given the main tumor burden.  Unfortunately he developed worsening symptoms and hypotension.  Was placed on IV vasopressors.  He was unresponsive and looked or appeared to be actively dying.  Per his wishes, all aggressive measures were offered and obtained including intubation.  After intubation he had refractory shock.  Likely related to acute on chronic RV failure worsened in the setting of PE and then subsequently exacerbated with positive pressure ventilation.  Despite  all aggressive measures he clinically worsens and I do not full support.    Pertinent Labs and Studies  Significant Diagnostic Studies VAS US  LOWER EXTREMITY VENOUS (DVT) Result Date: 2024/05/13  Lower Venous DVT Study Patient Name:  Jeff Romero  Date of Exam:   13-May-2024 Medical Rec #: [id number]    Accession #:    [id number] Date of Birth: 01-10-1961     Patient Gender: M Patient Age:   63 years Exam Location:  [facility] Procedure:      VAS US  LOWER EXTREMITY VENOUS (DVT) Referring Phys: ADITYA PALIWAL --------------------------------------------------------------------------------  Indications: Pulmonary embolism.  Risk Factors: Metastatic lung cancer. Anticoagulation: Levenox prior to admission. Limitations: Central & arterial line, and catheter placemen. Comparison Study: No previous exams Performing Technologist: Jody Hill RVT, RDMS  Examination Guidelines: A complete evaluation includes B-mode imaging, spectral Doppler, color Doppler, and power Doppler as needed of all accessible portions of each vessel. Bilateral testing is considered an integral part of a complete examination. Limited examinations for reoccurring indications may be performed as noted. The reflux portion of the exam is performed with the patient in reverse Trendelenburg.  +---------+---------------+---------+-----------+---------------+--------------+ RIGHT    CompressibilityPhasicitySpontaneityProperties     Thrombus Aging +---------+---------------+---------+-----------+---------------+--------------+ CFV                                                        unable to  visualize      +---------+---------------+---------+-----------+---------------+--------------+ SFJ                                                        unable to                                                                 visualize       +---------+---------------+---------+-----------+---------------+--------------+ FV Prox  Full           No       Yes        pulsatile and                                                             diminished                    +---------+---------------+---------+-----------+---------------+--------------+ FV Mid   None           No       No                        Age                                                                       Indeterminate  +---------+---------------+---------+-----------+---------------+--------------+ FV DistalNone           No       No                        Age                                                                       Indeterminate  +---------+---------------+---------+-----------+---------------+--------------+ PFV                     Yes      Yes                       patent by  color/dopple   +---------+---------------+---------+-----------+---------------+--------------+ POP      Full           No       Yes        pulsatile                     +---------+---------------+---------+-----------+---------------+--------------+ PTV      Full                                                             +---------+---------------+---------+-----------+---------------+--------------+ PERO     Full                                                             +---------+---------------+---------+-----------+---------------+--------------+ EIV                     No       Yes        pulsatile      patent by                                                                 color/doppler  +---------+---------------+---------+-----------+---------------+--------------+ CFV SFJ not visualized due to central line placement.  +--------+---------------+---------+-----------+----------+--------------------+ LEFT     CompressibilityPhasicitySpontaneityPropertiesThrombus Aging       +--------+---------------+---------+-----------+----------+--------------------+ CFV     Full           Yes      Yes                                       +--------+---------------+---------+-----------+----------+--------------------+ SFJ                                                  unable to visualize  +--------+---------------+---------+-----------+----------+--------------------+ FV Prox Partial        No       Yes        pulsatile Age Indeterminate    +--------+---------------+---------+-----------+----------+--------------------+ FV Mid  Full           No       Yes        pulsatile                      +--------+---------------+---------+-----------+----------+--------------------+ FV                     No       Yes        pulsatile patent by            Distal  color/doppler        +--------+---------------+---------+-----------+----------+--------------------+ PFV     Partial                                      Age Indeterminate    +--------+---------------+---------+-----------+----------+--------------------+ POP     Full           No       Yes        pulsatile                      +--------+---------------+---------+-----------+----------+--------------------+ PTV     Full                                                              +--------+---------------+---------+-----------+----------+--------------------+ PERO    Full                                                              +--------+---------------+---------+-----------+----------+--------------------+ Distal FV not well visualized due to catheter placement. SFJ not visualized due to arterial line placement.    Summary: BILATERAL: -No evidence of popliteal cyst, bilaterally. -Diffuse subcutaneous edema, bilaterally. RIGHT: - Findings consistent with age  indeterminate deep vein thrombosis involving the right femoral vein.   LEFT: - Findings consistent with age indeterminate deep vein thrombosis involving the left femoral vein, and left proximal profunda vein.   *See table(s) above for measurements and observations. Electronically signed by Debby Robertson on June 08, 2024 at 5:14:52 PM.    Final    ECHOCARDIOGRAM LIMITED Result Date: 06/08/2024    ECHOCARDIOGRAM LIMITED REPORT   Patient Name:   Jeff Romero Date of Exam: 06/08/24 Medical Rec #:  [id number]   Height:       67.0 in Accession #:    [id number]  Weight:       121.0 lb Date of Birth:  Oct 13, 1960    BSA:          1.633 m Patient Age:    63 years    BP:           148/89 mmHg Patient Gender: M           HR:           124 bpm. Exam Location:  Inpatient Procedure: Limited Color Doppler and Color Doppler (Both Spectral and Color Flow            Doppler were utilized during procedure). Indications:    CHF  History:        Patient has prior history of Echocardiogram examinations, most                 recent 02/13/2024. Malignant Neoplasm.  Sonographer:    Benard Stallion Referring Phys: [id number] JOSHUA C SMITH IMPRESSIONS  1. Left ventricular ejection fraction, by estimation, is 40 to 45%. The left ventricle has mildly decreased function. The left ventricle demonstrates global hypokinesis. Left ventricular diastolic function could not be evaluated.  2. Right ventricular systolic function is severely reduced. The right ventricular  size is severely enlarged. There is normal pulmonary artery systolic pressure. The estimated right ventricular systolic pressure is 28.6 mmHg.  3. Right atrial size was severely dilated.  4. The aortic valve is tricuspid. Aortic valve sclerosis/calcification is present, without any evidence of aortic stenosis.  5. The inferior vena cava is normal in size with <50% respiratory variability, suggesting right atrial pressure of 8 mmHg. FINDINGS  Left Ventricle: Left ventricular ejection fraction, by  estimation, is 40 to 45%. The left ventricle has mildly decreased function. The left ventricle demonstrates global hypokinesis. Abnormal (paradoxical) septal motion, consistent with left bundle branch block. Left ventricular diastolic function could not be evaluated. Right Ventricle: The right ventricular size is severely enlarged. Right ventricular systolic function is severely reduced. There is normal pulmonary artery systolic pressure. The tricuspid regurgitant velocity is 2.27 m/s, and with an assumed right atrial pressure of 8 mmHg, the estimated right ventricular systolic pressure is 28.6 mmHg. Right Atrium: Right atrial size was severely dilated. Tricuspid Valve: The tricuspid valve is normal in structure. Tricuspid valve regurgitation is mild . No evidence of tricuspid stenosis. Aortic Valve: The aortic valve is tricuspid. Aortic valve sclerosis/calcification is present, without any evidence of aortic stenosis. Venous: The inferior vena cava is normal in size with less than 50% respiratory variability, suggesting right atrial pressure of 8 mmHg. IAS/Shunts: There is left bowing of the interatrial septum, suggestive of elevated right atrial pressure. LEFT VENTRICLE PLAX 2D LVIDd:         4.20 cm LVIDs:         2.80 cm LV PW:         0.90 cm LV IVS:        0.90 cm  LV Volumes (MOD) LV vol d, MOD A2C: 55.2 ml LV vol d, MOD A4C: 63.2 ml LV vol s, MOD A2C: 31.6 ml LV vol s, MOD A4C: 51.6 ml LV SV MOD A2C:     23.6 ml LV SV MOD A4C:     63.2 ml LV SV MOD BP:      16.6 ml RIGHT VENTRICLE RV Basal diam:  4.60 cm RV Mid diam:    3.50 cm LEFT ATRIUM         Index       RIGHT ATRIUM           Index LA diam:    2.10 cm 1.29 cm/m  RA Area:     23.90 cm                                 RA Volume:   79.20 ml  48.49 ml/m  TRICUSPID VALVE TR Peak grad:   20.6 mmHg TR Vmax:        227.00 cm/s Wilbert Bihari MD Electronically signed by Wilbert Bihari MD Signature Date/Time: 05/19/2024/12:21:43 PM    Final    IR THROMB F/U EVAL  ART/VEN SUBSEQ DAY (MS) Result Date: 19-May-2024 INDICATION: 63 year old male with history of 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 EXAM: IR THROMB F/U EVAL ART/VEN SUBSEQ DAY (MS) COMPARISON:  None Available. MEDICATIONS: None. ANESTHESIA/SEDATION: None FLUOROSCOPY TIME:  None COMPLICATIONS: None immediate. TECHNIQUE: Informed written consent was obtained from the patient after a thorough discussion of the procedural risks, benefits and alternatives. All questions were addressed. Maximal Sterile Barrier Technique was utilized including caps, mask, sterile gowns, sterile gloves, sterile  drape, hand hygiene and skin antiseptic. A timeout was performed prior to the initiation of the procedure. Left pulmonary arterial mean pressure was measured at 17 mm Hg. The catheter was then removed. The right common femoral vein sheath was left in place per IC request. IMPRESSION: Decreased mean pulmonary artery pressure from 27 to 17 mmHg after left pulmonary artery catheter directed thrombolysis. The thrombolysis catheter was removed. Ester Sides, MD [facility] Electronically Signed   By: Ester Sides M.D.   On: 15-May-2024 11:23   DG Chest Port 1 View Result Date: 2024-05-15 CLINICAL DATA:  Pleural effusion. EXAM: PORTABLE CHEST 1 VIEW COMPARISON:  05/10/2024 FINDINGS: Endotracheal tube tip is 2.9 cm above the base of the carina. The NG tube passes into the stomach although the distal tip position is not included on the film. The dense consolidative opacity in the right upper lung is similar to prior with probable associated layering right pleural effusion. Left lung remains relatively clear. The cardiopericardial silhouette is within normal limits for size. Telemetry leads overlie the chest. IMPRESSION: No substantial interval change. Dense consolidative opacity in the right upper  lung with probable associated layering right pleural effusion. Electronically Signed   By: Camellia Candle M.D.   On: 15-May-2024 05:08   IR US  Guide Vasc Access Right Result Date: 05/10/2024 INDICATION: B/L PEs Symptomatic with high risk PE.  RV/LV ratio 1.6 EXAM: Title; LEFT PULMONARY ARTERIAL LYTIC CATHETER PLACEMENT Listed procedures; 1. ULTRASOUND GUIDANCE FOR VENOUS ACCESS 2. PULMONARY ARTERIOGRAPHY, BILATERAL 3. FLUOROSCOPIC GUIDED PLACEMENT of LEFT PULMONARY ARTERIAL LYTIC INFUSION CATHETER COMPARISON:  CTA CHEST, 05/10/2024 MEDICATIONS: None ANESTHESIA/SEDATION: Local anesthetic and single agent sedation was employed during this procedure. A total of Versed  0.5 mg was administered intravenously. The patient's level of consciousness and vital signs were monitored continuously by radiology nursing throughout the procedure under my direct supervision. CONTRAST:  35 mL Omnipaque  300 FLUOROSCOPY: Radiation Exposure Index and estimated peak skin dose (PSD); Reference air kerma (RAK), 34 mGy. COMPLICATIONS: None immediate. TECHNIQUE: Informed written consent was obtained from the patient and/or patient's representative after a discussion of the risks, benefits and alternatives to treatment. Questions regarding the procedure were encouraged and answered. A timeout was performed prior to the initiation of the procedure. Ultrasound scanning was performed of the RIGHT groin and demonstrated patency of the common femoral vein. As such, the RIGHT common femoral vein was selected venous access. The RIGHT groin was prepped and draped in the usual sterile fashion, and a sterile drape was applied covering the operative field. Maximum barrier sterile technique with sterile gowns and gloves were used for the procedure. A timeout was performed prior to the initiation of the procedure. Local anesthesia was provided with 1% lidocaine . Under direct ultrasound guidance, the RIGHT common femoral vein was accessed with a micro  puncture kit ultimately allowing placement of a 6 Fr 10 cm vascular sheath. Ultrasound images were saved for procedural documentation purposes. With the use of a Bentson wire, an angled pulmonary catheter was advanced into the main pulmonary artery and a limited central pulmonary arteriogram was performed. Pressure measurements were then obtained from the main pulmonary artery. The 5 Fr angled pigtail catheter was advanced into the distal branch of the LEFT lower lobe pulmonary artery. Limited contrast injection confirmed appropriate positioning. Over an exchange length Jesus, the catheter was exchanged for a 90/15 cm multi side-hole infusion catheter. A postprocedural fluoroscopic image was obtained to document final catheter positioning. The external catheter  tubing was secured at the right thigh and the lytic therapy was initiated. The patient tolerated the procedure well without immediate postprocedural complication. FINDINGS: Central pulmonary arteriogram demonstrates patent RIGHT bibasilar pulmonary arteries. Consolidated opacity at the RIGHT upper lobe, consistent with known malignancy and obstruction at the LEFT lower basilar pulmonary arteries. Acquired pressure measurements: Main  pulmonary artery-45/20; mean-27 (normal: < 25/10) Following the procedure, the LEFT infusion catheter tip terminate within the distal aspect of the LEFT lower lobe sub segmental pulmonary arteries. IMPRESSION: 1. Successful pulmonary arteriography and initiation of LEFT catheter-directed pulmonary arterial lysis for symptomatic, high-risk PE. 2. Elevated pressure measurements within the main pulmonary artery compatible with critical pulmonary arterial hypertension. PLAN: Lytic catheter infusion rates as described per order set. V[facility] Interventional Radiology will follow the patient with anticipated catheter removal during rounds. Thom Hall, MD [facility] Radiology  Electronically Signed   By: Thom Hall M.D.   On: 05/10/2024 18:41   IR INFUSION THROMBOL ARTERIAL INITIAL (MS) Result Date: 05/10/2024 INDICATION: B/L PEs Symptomatic with high risk PE.  RV/LV ratio 1.6 EXAM: Title; LEFT PULMONARY ARTERIAL LYTIC CATHETER PLACEMENT Listed procedures; 1. ULTRASOUND GUIDANCE FOR VENOUS ACCESS 2. PULMONARY ARTERIOGRAPHY, BILATERAL 3. FLUOROSCOPIC GUIDED PLACEMENT of LEFT PULMONARY ARTERIAL LYTIC INFUSION CATHETER COMPARISON:  CTA CHEST, 05/10/2024 MEDICATIONS: None ANESTHESIA/SEDATION: Local anesthetic and single agent sedation was employed during this procedure. A total of Versed  0.5 mg was administered intravenously. The patient's level of consciousness and vital signs were monitored continuously by radiology nursing throughout the procedure under my direct supervision. CONTRAST:  35 mL Omnipaque  300 FLUOROSCOPY: Radiation Exposure Index and estimated peak skin dose (PSD); Reference air kerma (RAK), 34 mGy. COMPLICATIONS: None immediate. TECHNIQUE: Informed written consent was obtained from the patient and/or patient's representative after a discussion of the risks, benefits and alternatives to treatment. Questions regarding the procedure were encouraged and answered. A timeout was performed prior to the initiation of the procedure. Ultrasound scanning was performed of the RIGHT groin and demonstrated patency of the common femoral vein. As such, the RIGHT common femoral vein was selected venous access. The RIGHT groin was prepped and draped in the usual sterile fashion, and a sterile drape was applied covering the operative field. Maximum barrier sterile technique with sterile gowns and gloves were used for the procedure. A timeout was performed prior to the initiation of the procedure. Local anesthesia was provided with 1% lidocaine . Under direct ultrasound guidance, the RIGHT common femoral vein was accessed with a micro puncture kit ultimately allowing placement of a 6 Fr 10  cm vascular sheath. Ultrasound images were saved for procedural documentation purposes. With the use of a Bentson wire, an angled pulmonary catheter was advanced into the main pulmonary artery and a limited central pulmonary arteriogram was performed. Pressure measurements were then obtained from the main pulmonary artery. The 5 Fr angled pigtail catheter was advanced into the distal branch of the LEFT lower lobe pulmonary artery. Limited contrast injection confirmed appropriate positioning. Over an exchange length Jesus, the catheter was exchanged for a 90/15 cm multi side-hole infusion catheter. A postprocedural fluoroscopic image was obtained to document final catheter positioning. The external catheter tubing was secured at the right thigh and the lytic therapy was initiated. The patient tolerated the procedure well without immediate postprocedural complication. FINDINGS: Central pulmonary arteriogram demonstrates patent RIGHT bibasilar pulmonary arteries. Consolidated opacity at the RIGHT upper lobe, consistent with known malignancy and obstruction at the LEFT lower basilar pulmonary arteries. Acquired pressure  measurements: Main  pulmonary artery-45/20; mean-27 (normal: < 25/10) Following the procedure, the LEFT infusion catheter tip terminate within the distal aspect of the LEFT lower lobe sub segmental pulmonary arteries. IMPRESSION: 1. Successful pulmonary arteriography and initiation of LEFT catheter-directed pulmonary arterial lysis for symptomatic, high-risk PE. 2. Elevated pressure measurements within the main pulmonary artery compatible with critical pulmonary arterial hypertension. PLAN: Lytic catheter infusion rates as described per order set. Vascular Interventional Radiology will follow the patient with anticipated catheter removal during rounds. Thom Hall, MD [facility] Radiology Electronically Signed   By: Thom Hall M.D.   On: 05/10/2024  18:41   IR Angiogram Pulmonary Left Selective Result Date: 05/10/2024 INDICATION: B/L PEs Symptomatic with high risk PE.  RV/LV ratio 1.6 EXAM: Title; LEFT PULMONARY ARTERIAL LYTIC CATHETER PLACEMENT Listed procedures; 1. ULTRASOUND GUIDANCE FOR VENOUS ACCESS 2. PULMONARY ARTERIOGRAPHY, BILATERAL 3. FLUOROSCOPIC GUIDED PLACEMENT of LEFT PULMONARY ARTERIAL LYTIC INFUSION CATHETER COMPARISON:  CTA CHEST, 05/10/2024 MEDICATIONS: None ANESTHESIA/SEDATION: Local anesthetic and single agent sedation was employed during this procedure. A total of Versed  0.5 mg was administered intravenously. The patient's level of consciousness and vital signs were monitored continuously by radiology nursing throughout the procedure under my direct supervision. CONTRAST:  35 mL Omnipaque  300 FLUOROSCOPY: Radiation Exposure Index and estimated peak skin dose (PSD); Reference air kerma (RAK), 34 mGy. COMPLICATIONS: None immediate. TECHNIQUE: Informed written consent was obtained from the patient and/or patient's representative after a discussion of the risks, benefits and alternatives to treatment. Questions regarding the procedure were encouraged and answered. A timeout was performed prior to the initiation of the procedure. Ultrasound scanning was performed of the RIGHT groin and demonstrated patency of the common femoral vein. As such, the RIGHT common femoral vein was selected venous access. The RIGHT groin was prepped and draped in the usual sterile fashion, and a sterile drape was applied covering the operative field. Maximum barrier sterile technique with sterile gowns and gloves were used for the procedure. A timeout was performed prior to the initiation of the procedure. Local anesthesia was provided with 1% lidocaine . Under direct ultrasound guidance, the RIGHT common femoral vein was accessed with a micro puncture kit ultimately allowing placement of a 6 Fr 10 cm vascular sheath. Ultrasound images were saved for procedural  documentation purposes. With the use of a Bentson wire, an angled pulmonary catheter was advanced into the main pulmonary artery and a limited central pulmonary arteriogram was performed. Pressure measurements were then obtained from the main pulmonary artery. The 5 Fr angled pigtail catheter was advanced into the distal branch of the LEFT lower lobe pulmonary artery. Limited contrast injection confirmed appropriate positioning. Over an exchange length Jesus, the catheter was exchanged for a 90/15 cm multi side-hole infusion catheter. A postprocedural fluoroscopic image was obtained to document final catheter positioning. The external catheter tubing was secured at the right thigh and the lytic therapy was initiated. The patient tolerated the procedure well without immediate postprocedural complication. FINDINGS: Central pulmonary arteriogram demonstrates patent RIGHT bibasilar pulmonary arteries. Consolidated opacity at the RIGHT upper lobe, consistent with known malignancy and obstruction at the LEFT lower basilar pulmonary arteries. Acquired pressure measurements: Main  pulmonary artery-45/20; mean-27 (normal: < 25/10) Following the procedure, the LEFT infusion catheter tip terminate within the distal aspect of the LEFT lower lobe sub segmental pulmonary arteries. IMPRESSION: 1. Successful pulmonary arteriography and initiation of LEFT catheter-directed pulmonary arterial lysis for symptomatic, high-risk PE. 2. Elevated pressure measurements within the main pulmonary  artery compatible with critical pulmonary arterial hypertension. PLAN: Lytic catheter infusion rates as described per order set. Vascular Interventional Radiology will follow the patient with anticipated catheter removal during rounds. Thom Hall, MD [facility] Radiology Electronically Signed   By: Thom Hall M.D.   On: 05/10/2024 18:41   IR Angiogram Pulmonary Bilateral Selective Result Date:  05/10/2024 INDICATION: B/L PEs Symptomatic with high risk PE.  RV/LV ratio 1.6 EXAM: Title; LEFT PULMONARY ARTERIAL LYTIC CATHETER PLACEMENT Listed procedures; 1. ULTRASOUND GUIDANCE FOR VENOUS ACCESS 2. PULMONARY ARTERIOGRAPHY, BILATERAL 3. FLUOROSCOPIC GUIDED PLACEMENT of LEFT PULMONARY ARTERIAL LYTIC INFUSION CATHETER COMPARISON:  CTA CHEST, 05/10/2024 MEDICATIONS: None ANESTHESIA/SEDATION: Local anesthetic and single agent sedation was employed during this procedure. A total of Versed  0.5 mg was administered intravenously. The patient's level of consciousness and vital signs were monitored continuously by radiology nursing throughout the procedure under my direct supervision. CONTRAST:  35 mL Omnipaque  300 FLUOROSCOPY: Radiation Exposure Index and estimated peak skin dose (PSD); Reference air kerma (RAK), 34 mGy. COMPLICATIONS: None immediate. TECHNIQUE: Informed written consent was obtained from the patient and/or patient's representative after a discussion of the risks, benefits and alternatives to treatment. Questions regarding the procedure were encouraged and answered. A timeout was performed prior to the initiation of the procedure. Ultrasound scanning was performed of the RIGHT groin and demonstrated patency of the common femoral vein. As such, the RIGHT common femoral vein was selected venous access. The RIGHT groin was prepped and draped in the usual sterile fashion, and a sterile drape was applied covering the operative field. Maximum barrier sterile technique with sterile gowns and gloves were used for the procedure. A timeout was performed prior to the initiation of the procedure. Local anesthesia was provided with 1% lidocaine . Under direct ultrasound guidance, the RIGHT common femoral vein was accessed with a micro puncture kit ultimately allowing placement of a 6 Fr 10 cm vascular sheath. Ultrasound images were saved for procedural documentation purposes. With the use of a Bentson wire, an angled  pulmonary catheter was advanced into the main pulmonary artery and a limited central pulmonary arteriogram was performed. Pressure measurements were then obtained from the main pulmonary artery. The 5 Fr angled pigtail catheter was advanced into the distal branch of the LEFT lower lobe pulmonary artery. Limited contrast injection confirmed appropriate positioning. Over an exchange length Jesus, the catheter was exchanged for a 90/15 cm multi side-hole infusion catheter. A postprocedural fluoroscopic image was obtained to document final catheter positioning. The external catheter tubing was secured at the right thigh and the lytic therapy was initiated. The patient tolerated the procedure well without immediate postprocedural complication. FINDINGS: Central pulmonary arteriogram demonstrates patent RIGHT bibasilar pulmonary arteries. Consolidated opacity at the RIGHT upper lobe, consistent with known malignancy and obstruction at the LEFT lower basilar pulmonary arteries. Acquired pressure measurements: Main  pulmonary artery-45/20; mean-27 (normal: < 25/10) Following the procedure, the LEFT infusion catheter tip terminate within the distal aspect of the LEFT lower lobe sub segmental pulmonary arteries. IMPRESSION: 1. Successful pulmonary arteriography and initiation of LEFT catheter-directed pulmonary arterial lysis for symptomatic, high-risk PE. 2. Elevated pressure measurements within the main pulmonary artery compatible with critical pulmonary arterial hypertension. PLAN: Lytic catheter infusion rates as described per order set. Vascular Interventional Radiology will follow the patient with anticipated catheter removal during rounds. Thom Hall, MD [facility] Radiology Electronically Signed   By: Thom Hall M.D.   On: 05/10/2024 18:41  DG Abd 1 View Result Date: 05/10/2024 CLINICAL DATA:  Intubated EXAM: ABDOMEN - 1 VIEW COMPARISON:  05/10/2024 FINDINGS:  Frontal view of the lower chest and upper abdomen demonstrates enteric catheter tip and side port projecting over the gastric body. Persistent nephrogram related to recent contrasted procedure and CT. Bowel gas pattern is unremarkable. Venous catheter via a right inferior approach tip overlying the left lower lobe pulmonary vascular distribution. IMPRESSION: 1. Enteric catheter tip projecting over the gastric body. Electronically Signed   By: Ozell Daring M.D.   On: 05/10/2024 18:39   DG CHEST PORT 1 VIEW Result Date: 05/10/2024 CLINICAL DATA:  Intubated EXAM: PORTABLE CHEST 1 VIEW COMPARISON:  05/09/2024 FINDINGS: Single frontal view of the chest demonstrates endotracheal tube overlying tracheal air column, tip approximately 3 cm above carina. Enteric catheter passes below diaphragm, tip excluded by collimation. Right-sided PICC tip overlying superior vena cava. Pulmonary artery catheter overlying the left lower lobe pulmonary artery distribution. Dense right upper lobe consolidation and right pleural effusion again noted. Bullous changes are seen at the apices. No evidence of pneumothorax. No acute bony abnormalities. IMPRESSION: 1. Support devices as above. 2. Dense right upper lobe consolidation and right pleural effusion unchanged. Electronically Signed   By: Ozell Daring M.D.   On: 05/10/2024 18:38   ECHOCARDIOGRAM COMPLETE Result Date: 05/10/2024    ECHOCARDIOGRAM REPORT   Patient Name:   Jeff Romero Date of Exam: 05/10/2024 Medical Rec #:  [id number]   Height:       67.0 in Accession #:    [id number]  Weight:       121.0 lb Date of Birth:  [birth year]    BSA:          1.633 m Patient Age:    63 years    BP:           121/84 mmHg Patient Gender: M           HR:           113 bpm. Exam Location:  Inpatient Procedure: 2D Echo, Cardiac Doppler and Color Doppler (Both Spectral and Color            Flow Doppler were utilized during procedure). Indications:     Dyspnea R06.00  History:         Patient has prior  history of Echocardiogram examinations, most                  recent 02/13/2024.  Sonographer:     Jayson Gaskins Referring Phys:  [id number] FONDA JAYSON SHARPS Diagnosing Phys: Soyla Merck MD  Sonographer Comments: Technically challenging study due to limited acoustic windows. Image acquisition challenging due to uncooperative patient and Image acquisition challenging due to respiratory motion. IMPRESSIONS  1. Left ventricular ejection fraction, by estimation, is 50 to 55%. The left ventricle has low normal function. Left ventricular endocardial border not optimally defined to evaluate regional wall motion. There is mild left ventricular hypertrophy. Left ventricular diastolic parameters are indeterminate.  2. Right ventricular systolic function is severely reduced. The right ventricular size is severely enlarged. There is normal pulmonary artery systolic pressure. The estimated right ventricular systolic pressure is 21.0 mmHg. Severe TR with triangular shaped jet suggests rapid equalization of pressures due to annular dilation resulting in poor tricuspid valve coaptation. Chart review indicates known pulmonary embolism.  3. Tricuspid valve regurgitation is severe.  4. Right atrial size was severely dilated.  5. The mitral valve is grossly normal. Trivial mitral valve  regurgitation. No evidence of mitral stenosis.  6. The aortic valve is tricuspid. Aortic valve regurgitation is not visualized. No aortic stenosis is present.  7. The inferior vena cava is normal in size with greater than 50% respiratory variability, suggesting right atrial pressure of 3 mmHg. FINDINGS  Left Ventricle: Left ventricular ejection fraction, by estimation, is 50 to 55%. The left ventricle has low normal function. Left ventricular endocardial border not optimally defined to evaluate regional wall motion. The left ventricular internal cavity  size was normal in size. There is mild left ventricular hypertrophy. Left ventricular diastolic  parameters are indeterminate. Right Ventricle: The right ventricular size is severely enlarged. No increase in right ventricular wall thickness. Right ventricular systolic function is severely reduced. There is normal pulmonary artery systolic pressure. The tricuspid regurgitant velocity is 2.12 m/s, and with an assumed right atrial pressure of 3 mmHg, the estimated right ventricular systolic pressure is 21.0 mmHg. Left Atrium: Left atrial size was normal in size. Right Atrium: Right atrial size was severely dilated. Pericardium: There is no evidence of pericardial effusion. Mitral Valve: The mitral valve is grossly normal. Trivial mitral valve regurgitation. No evidence of mitral valve stenosis. Tricuspid Valve: The tricuspid valve is normal in structure. Tricuspid valve regurgitation is severe. No evidence of tricuspid stenosis. Aortic Valve: The aortic valve is tricuspid. Aortic valve regurgitation is not visualized. No aortic stenosis is present. Pulmonic Valve: The pulmonic valve was not well visualized. Pulmonic valve regurgitation is trivial. No evidence of pulmonic stenosis. Aorta: The aortic root is normal in size and structure. Venous: The inferior vena cava is normal in size with greater than 50% respiratory variability, suggesting right atrial pressure of 3 mmHg. IAS/Shunts: No atrial level shunt detected by color flow Doppler.  LEFT VENTRICLE PLAX 2D LVIDd:         3.30 cm LVIDs:         1.20 cm LV PW:         1.20 cm LV IVS:        1.10 cm LVOT diam:     1.80 cm LVOT Area:     2.54 cm  RIGHT VENTRICLE RV S prime:     8.27 cm/s  AORTA Ao Root diam: 3.20 cm TRICUSPID VALVE TR Peak grad:   18.0 mmHg TR Vmax:        212.00 cm/s  SHUNTS Systemic Diam: 1.80 cm Soyla Merck MD Electronically signed by Soyla Merck MD Signature Date/Time: 05/10/2024/5:17:40 PM    Final (Updated)    US  EKG SITE RITE Result Date: 05/10/2024 If Site Rite image not attached, placement could not be confirmed due to current  cardiac rhythm.  DG Abd 1 View Result Date: 05/10/2024 CLINICAL DATA:  Constipation. EXAM: ABDOMEN - 1 VIEW COMPARISON:  CT 05/04/2024 FINDINGS: No bowel dilatation to suggest obstruction. Small volume of formed stool in the colon. No abnormal rectal distention. No visible radiopaque calculi. Known osseous lesions are not well demonstrated by radiograph. IMPRESSION: Small volume of formed stool in the colon. No bowel obstruction. Electronically Signed   By: Andrea Gasman M.D.   On: 05/10/2024 11:39   CT Angio Chest PE W and/or Wo Contrast Result Date: 05/10/2024 CLINICAL DATA:  Worsening dyspnea malignancy EXAM: CT ANGIOGRAPHY CHEST WITH CONTRAST TECHNIQUE: Multidetector CT imaging of the chest was performed using the standard protocol during bolus administration of intravenous contrast. Multiplanar CT image reconstructions and MIPs were obtained to evaluate the vascular anatomy. RADIATION DOSE REDUCTION: This exam was performed  according to the departmental dose-optimization program which includes automated exposure control, adjustment of the mA and/or kV according to patient size and/or use of iterative reconstruction technique. CONTRAST:  75mL OMNIPAQUE  IOHEXOL  350 MG/ML SOLN COMPARISON:  Chest x-ray 05/09/2024, CT 05/04/2024 FINDINGS: Cardiovascular: Satisfactory opacification of the pulmonary arteries to the segmental level. Multiple small acute right lower lobe segmental/subsegmental emboli. Left upper lobe small segmental and subsegmental emboli. Multiple left lower lobe segmental and subsegmental PE. Positive for right heart strain with RV LV ratio of 1.6. The heart appears slightly enlarged. Reflux of contrast into the hepatic veins. No significant pericardial effusion. Mild aortic atherosclerosis. Narrowed appearing upper superior vena cava. Irregular foci of airspace disease in the right superior lower lobe Mediastinum/Nodes: Midline trachea. Supraclavicular adenopathy difficult to see due to  streak artifact and paucity of fat. Known mediastinal and right hilar adenopathy is better seen on the 07/31 exam likely due to phase of imaging. Lungs/Pleura: Right hilar mass with narrowing of the bronchi. Masslike consolidation throughout the right upper lobe as seen on recent imaging. Underlying emphysema. Irregular foci of disease in the right lower lobe without recent significant change. Similar sized moderate right pleural effusion. Suspect areas of necrosis within the right upper lobe consolidation. Upper Abdomen: No acute finding Musculoskeletal: The previously measured suspected right anterior chest wall lesion is also more difficult to see due to phase of enhancement. Review of the MIP images confirms the above findings. IMPRESSION: 1. Positive for acute bilateral pulmonary emboli with CT evidence for right heart strain (RV/LV Ratio = 1.6 ) consistent with at least submassive (intermediate risk) PE. The presence of right heart strain has been associated with an increased risk of morbidity and mortality. Please refer to the PE Focused order set in EPIC. 2. Redemonstrated large right hilar mass and masslike consolidation in the right upper lobe and infrahilar region, grossly similar as compared with the recent CT. Similar sized moderate right pleural effusion. Overall the findings are more difficult to measure/characterize compared to the CT from 07/31, likely due to phase of enhancement. 3. Known mediastinal, right hilar and supraclavicular adenopathy is better seen on the 07/31 exam likely due to phase of imaging. 4. Aortic atherosclerosis. Critical Value/emergent results were called by telephone at the time of interpretation on 05/10/2024 at 2:16 am to provider Dr. Albertina , who verbally acknowledged these results. Aortic Atherosclerosis (ICD10-I70.0) and Emphysema (ICD10-J43.9). Electronically Signed   By: Luke Bun M.D.   On: 05/10/2024 02:19   CT CHEST ABDOMEN PELVIS W CONTRAST Result Date:  05/09/2024 CLINICAL DATA:  Right upper lobe neoplasm. Metastatic disease evaluation. EXAM: CT CHEST, ABDOMEN, AND PELVIS WITH CONTRAST TECHNIQUE: Multidetector CT imaging of the chest, abdomen and pelvis was performed following the standard protocol during bolus administration of intravenous contrast. RADIATION DOSE REDUCTION: This exam was performed according to the departmental dose-optimization program which includes automated exposure control, adjustment of the mA and/or kV according to patient size and/or use of iterative reconstruction technique. CONTRAST:  80mL OMNIPAQUE  IOHEXOL  300 MG/ML  SOLN COMPARISON:  CTA chest studies 03/15/2024 and 02/12/2024, and PET-CT 02/01/2024. FINDINGS: CT CHEST FINDINGS Cardiovascular: The cardiac size is normal. There is a small anterior pericardial effusion not seen previously. There is atherosclerosis in the aortic arch and proximal great vessels without aneurysm, stenosis or dissection. There is central bronchovascular encasement on the right due to hilar adenopathy. The right superior pulmonary vein is markedly narrowed by extrinsic compression. Other central vessels are only mildly narrowed. The pulmonary arteries  are normal caliber. There is a small right lower lobe segmental embolus, not seen previously on 2:36 No other arterial embolic disease is seen on this nondedicated exam. Mediastinum/Nodes: There is right supraclavicular adenopathy, today is less well seen due to interval weight loss and decreased body fat. There is a lymph node adjacent the right lobe of thyroid  gland in this area measuring 1.3 cm short axis, was previously 1.9 cm. There is a right superior hilar mass, more difficult to see due to increased adjacent lung consolidation, appears to measure 3.7 x 3.1 cm on 2:28, previously 3.0 x 2.8 cm. There is a right mid hilar nodal mass today measuring 3.4 x 2.6 cm on 2:35, was previously 3.1 x 2.1 cm. Low right paratracheal nodal mass is 2.5 x 2.6 cm on 2:25,  previously 2.6 x 2.6 cm. A subcarinal lymph node is 1.4 cm short axis, unchanged. There is a left hilar lymph node measuring 1.4 cm in short axis, previously 1.9 cm. Negative thyroid  gland, thoracic trachea, thoracic esophagus and axillary spaces. Lungs/Pleura: Moderate right pleural effusion, again tracks into the apex where it may be partially loculated. The fluid is similar in volume to the last CT with minimal left pleural effusion new in the interval. Paraseptal and small bullous emphysematous changes are noted in the upper lobes. A centrally necrotic peripherally enhancing right upper lobe mass measures 4.6 x 3.6 cm on 2:25, previously 5.0 x 4.0 cm. There is interval increased peritumoral dense airspace consolidation in the right upper lobe, small amount of perihilar airspace disease right middle lobe probably related to neck strain is compression of the bronchus intermedius by adenopathy. The distal right main bronchus and right lower lobe basal subsegmental bronchi are also mildly narrowed by this. There is a small amount of airspace disease in the superior segment of right lower lobe. There is a 7 mm left lower lobe nodule on 7:104, suspected metastatic and was larger previously measuring 1.6 cm. Linear atelectatic changes are noted in the left upper lobe, posterior lower lobes. No other visible nodules. Musculoskeletal: There is a heterogeneous mass straddling the pleural space in the right chest wall in the anteromedial first intercostal space. Today this measures 4.5 x 2.8 cm on 2:22, was previously 3.5 x 2.3 cm. There is associated destructive lytic change of the underlying anteromedial second rib, also new. No other regional metastatic bone abnormality is seen. CT ABDOMEN PELVIS FINDINGS Hepatobiliary: In segment 4A there are 2 new irregularly rim enhancing hypodense masses consistent with necrotic metastases, measuring 5.2 x 4.5 cm on 2:51, and further down near liver hilum a larger similar mass is  7.1 x 5.1 cm on 2:59. In segment 5 there is an enhancing mass with a greater solid component measuring 2.0 x 2.1 cm on 2:82, and in segment 3 there is a rim enhancing metastasis measuring 2.7 x 2.0 cm on 2:83. There is a stable 2.2 cm cyst in segment 5. No other focal mass enhancement. The liver is enlarged measuring 23.5 cm length. Gallbladder and bile ducts are unremarkable. Pancreas: No abnormality. Spleen: No abnormality. Adrenals/Urinary Tract: There is no adrenal or renal mass enhancement. No urinary stone or obstruction. The bladder contracted and not well seen. Stomach/Bowel: Negative for dilatation or wall thickening, including the appendix. Mild-to-moderate fecal stasis. Vascular/Lymphatic: There is heavy for age aortoiliac calcific plaque without aneurysm. Enlarged lymph nodes noted anterior to the abdominal aorta, largest 1.4 cm short axis. No further adenopathy. Reproductive: No prostatomegaly. Both testicles are in the  scrotal sac. Other: Increased body wall anasarca with interval weight loss and decreased body fat stores. Findings consistent with cachexia. There is mild free ascites. There is haziness in the mesentery probably related to malnutrition or hepatic dysfunction. Musculoskeletal: There are progressive bone metastases compared with the PET-CT. There previously was a subcentimeter lytic lesion in the right iliac wing which has enlarged and destroyed the overlying bone with protrusion into the overlying and underlying pelvic musculature. This measures 4.9 x 3.1 cm on 2:99. There is a new lytic lesion in the right ischial tuberosity measuring 2.1 x 1.8 cm on 2:119, a new lytic lesion in the posterior left ilium measuring 1.9 x 1.9 cm on 2:101, and a new lytic destructive lesion is present at S1 anteriorly eccentric to the left and measures 4.3 x 2.7 cm. I do not see any lesions in the lumbar vertebrae. IMPRESSION: 1. Interval progression of musculoskeletal metastatic disease. 2. Right upper  lobe mass is slightly smaller but there is interval increased peritumoral airspace consolidation in the right upper lobe, and a small amount of airspace disease in the superior segment of the right lower lobe and in the perihilar right middle lobe. 3. Right hilar adenopathy slightly larger. Mediastinal adenopathy basically stable. 4. Right supraclavicular adenopathy is less well seen due to interval weight loss and decreased body fat stores. May have slightly improved. 5. New small right lower lobe segmental arterial embolus. No findings of right heart strain. No other arterial embolus on this nondedicated exam. 6. New small left pleural effusion. Moderate right pleural fluid seems unchanged. 7. Left lower lobe nodule metastasis is smaller. 8. New liver metastases.  Progressive bone metastases. 9. Increased body wall anasarca with interval weight loss and decreased body fat stores. 10. Mild free ascites. 11. Aortic and coronary artery atherosclerosis. 12. Emphysema. Aortic Atherosclerosis (ICD10-I70.0) and Emphysema (ICD10-J43.9). Electronically Signed   By: Francis Quam M.D.   On: 05/09/2024 23:43   DG Chest Portable 1 View Result Date: 05/09/2024 CLINICAL DATA:  shortness of breath.  Lung cancer EXAM: PORTABLE CHEST 1 VIEW COMPARISON:  CT chest abdomen pelvis 05/04/2024 FINDINGS: The heart and mediastinal contours are within normal limits. Right upper lobe complete opacification with apical bullous changes consistent with known underlying malignancy. Left apical bullous changes. No pulmonary edema. Persistent trace to small right pleural effusion. No pneumothorax. No acute osseous abnormality. IMPRESSION: 1. Right upper lobe complete opacification with apical bullous changes consistent with known underlying malignancy. Associated postobstructive atelectasis with possibly superimposed infection. Finding better evaluated on CT chest 05/04/2024. 2. Persistent trace to small right pleural effusion 3. No acute  cardiopulmonary abnormality. Electronically Signed   By: Morgane  Naveau M.D.   On: 05/09/2024 22:10    Microbiology No results found for this or any previous visit (from the past 240 hours).  Lab Basic Metabolic Panel: No results for input(s): NA, K, CL, CO2, GLUCOSE, BUN, CREATININE, CALCIUM , MG, PHOS in the last 168 hours. Liver Function Tests: No results for input(s): AST, ALT, ALKPHOS, BILITOT, PROT, ALBUMIN  in the last 168 hours. No results for input(s): LIPASE, AMYLASE in the last 168 hours. No results for input(s): AMMONIA in the last 168 hours. CBC: No results for input(s): WBC, NEUTROABS, HGB, HCT, MCV, PLT in the last 168 hours. Cardiac Enzymes: No results for input(s): CKTOTAL, CKMB, CKMBINDEX, TROPONINI in the last 168 hours. Sepsis Labs: No results for input(s): PROCALCITON, WBC, LATICACIDVEN in the last 168 hours.  Procedures/Operations  As per EMR   Donnice  R Renley Gutman 06/01/2024, 4:28 PM

## 2024-06-02 DIAGNOSIS — Z66 Do not resuscitate: Secondary | ICD-10-CM | POA: Insufficient documentation

## 2024-06-05 NOTE — Progress Notes (Signed)
 Asystole at monitor, this Water engineer verified for pulse. Patient time of death occurred at 20:58 with family at bedside.

## 2024-06-05 NOTE — Progress Notes (Addendum)
 I responded to the page for family support and spent time with Jeff Romero's wife and other family members as they waited for him to stabilize and heard from the doctor. Jeff Romero and his wife have been together since they were 63 years old and have raised a beautiful family together who are all supporting them right now.  Passed off to chaplain Mallie when she arrived.

## 2024-06-05 NOTE — IPAL (Addendum)
  Interdisciplinary Goals of Care Family Meeting   Date carried out: 05/30/2024  Location of the meeting: Bedside  Member's involved: Nurse Practitioner and Bedside Registered Nurse  Durable Power of Attorney or acting medical decision maker: Wife- Pharmacologist   Discussion: We discussed goals of care for [name]. Talked with [name] in regards to repeat ABG, labwork, and overall critical care status of the patient. Unfortunately patient has no improvement in metabolic acidosis despite full support interventions. Also patient having worsening thrombocytopenia, with hemoglobin remaining steady. Concern patient having DIC in setting of shock. Patient still on high dose epi, levophed , and vasopressin  at this time. With all this being noted, patient is actively dying. Wife stating that she does not want her husband to suffer and will discuss with children about next steps in regards to GOC.   Code status:   Code Status: Full Code   Disposition: Continue current acute care- see below  Will discuss with family, if have future questions about overall status along with GOC.    Family requested another meeting to discuss and explore GOC conversations. Plan is to see if patient can tolerate pressors decreasing to allow family to see if patient is truly depending on them. Will decrease to a safe level to allow family to have realization that patient is dependent upon vasopressor support and unfortunately actively dying. Family also wanted to change patient to DNR status upon decreasing vasopressors slightly. Discussed with family that patient is in multisystem organ failure despite max interventions provided.  P:  Patient now DNR  Continue to discuss GOC/ and eventually comfort care when family is ready   Time spent for the meeting: 30 mins   Jeff Romero AGACNP-BC   [name] 06/04/2024, 4:35 PM  Please see [email or web address] for pager details.  From 7A-7P if no response,  please call [phone]. After hours, please call ELink [phone].    Jeff JAYSON Sharps, NP  05/15/2024, 4:30 PM

## 2024-06-05 NOTE — Progress Notes (Signed)
 Referring Physician(s): Hunsucker,M  Supervising Physician: Jennefer Rover  Patient Status:  [name] - In-pt  Chief Complaint: Bilateral pulmonary embolism with right heart strain/respiratory distress, stage IV metastatic lung cancer - s/p initiation of left pulmonary arterial thrombolytic therapy on 8/6   Subjective: Patient currently on vent as well as epi/levo/vasopressin ; heparin  drip/tPA stopped yesterday evening after decompensation events concerning for bleeding due to drop in hemoglobin despite blood transfusion; now with multisystem organ failure, multi pressor shock, refractory acidosis; family considering DNR.   Past Medical History:  Diagnosis Date   Allergy    Anxiety    Arthritis    Blood transfusion without reported diagnosis    Cataract    Depression    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   Prostate cancer [facility])    Past Surgical History:  Procedure Laterality Date   BRONCHIAL BIOPSY  01/21/2024   Procedure: BRONCHOSCOPY, WITH BIOPSY;  Surgeon: Gretta Leita SQUIBB, DO;  Location: [facility];  Service: Cardiopulmonary;;   BRONCHIAL BRUSHINGS  01/21/2024   Procedure: BRONCHOSCOPY, WITH BRUSH BIOPSY;  Surgeon: Gretta Leita SQUIBB, DO;  Location: [facility];  Service: Cardiopulmonary;;   ESOPHAGOGASTRODUODENOSCOPY N/A 04/12/2024   Procedure: EGD (ESOPHAGOGASTRODUODENOSCOPY);  Surgeon: Wilhelmenia Aloha Raddle., MD;  Location: [facility];  Service: Gastroenterology;  Laterality: N/A;   FLEXIBLE BRONCHOSCOPY N/A 01/21/2024   Procedure: BRONCHOSCOPY, FLEXIBLE;  Surgeon: Gretta Leita SQUIBB, DO;  Location: [facility];  Service: Cardiopulmonary;  Laterality: N/A;   INGUINAL HERNIA REPAIR Right 09/18/2021   Procedure: OPEN RIGHT INGUINAL HERNIA REPAIR WITH MESH;  Surgeon: Kinsinger, Herlene Righter, MD;  Location: [facility];  Service: General;  Laterality: Right;   IR ANGIOGRAM PULMONARY BILATERAL SELECTIVE  05/10/2024   IR ANGIOGRAM PULMONARY NONSELECTIVE  CATHETER OR VENOUS INJECTION  05/10/2024   IR INFUSION THROMBOL ARTERIAL INITIAL (MS)  05/10/2024   IR THORACENTESIS ASP PLEURAL SPACE W/IMG GUIDE  03/21/2024   IR THROMB F/U EVAL ART/VEN SUBSEQ DAY (MS)  05/11/2024   IR US  GUIDE VASC ACCESS RIGHT  05/10/2024   KNEE CARTILAGE SURGERY Right    PROSTATE SURGERY     ROTATOR CUFF REPAIR Right       Allergies: Patient has no known allergies.  Medications: Prior to Admission medications   Medication Sig Start Date End Date Taking? Authorizing Provider  acetaminophen  (TYLENOL ) 500 MG tablet Take 2 tablets (1,000 mg total) by mouth every 8 (eight) hours. Patient taking differently: Take 1,000 mg by mouth in the morning and at bedtime. 02/15/24  Yes Ghimire, Donalda HERO, MD  albuterol  (VENTOLIN  HFA) 108 (90 Base) MCG/ACT inhaler Inhale 2 puffs into the lungs every 6 (six) hours as needed for wheezing or shortness of breath. 12/23/23  Yes [provider]  benzonatate  (TESSALON ) 200 MG capsule Take 1 capsule (200 mg total) by mouth 3 (three) times daily. 03/28/24  Yes Pickenpack-Cousar, Fannie SAILOR, NP  desipramine (NORPRAMIN) 25 MG tablet Take 25 mg by mouth at bedtime.   Yes [provider]  enoxaparin  (LOVENOX ) 60 MG/0.6ML injection Inject 0.6 mLs (60 mg total) into the skin 2 (two) times daily. 04/25/24 07/24/24 Yes Heilingoetter, Cassandra L, PA-C  ferrous sulfate 325 (65 FE) MG tablet Take 325 mg by mouth daily with breakfast.   Yes [provider]  gabapentin  (NEURONTIN ) 100 MG capsule Take 1 capsule (100 mg total) by mouth 3 (three) times daily. 05/02/24  Yes Pickenpack-Cousar, Fannie SAILOR, NP  guaiFENesin -codeine  100-10 MG/5ML syrup Take 10 mLs by mouth every  4 (four) hours as needed for cough. 04/15/24  Yes Will Almarie MATSU, MD  HYDROcodone  bit-homatropine [facility]) 5-1.5 MG/5ML syrup Take 5 mLs by mouth every 8 (eight) hours as needed for cough. 03/28/24  Yes Pickenpack-Cousar, Fannie SAILOR, NP  HYDROmorphone  (DILAUDID ) 2 MG tablet  Take 0.5 tablets (1 mg total) by mouth every 4 (four) hours as needed for severe pain (pain score 7-10). Patient taking differently: Take 2 mg by mouth every 4 (four) hours as needed for moderate pain (pain score 4-6) or severe pain (pain score 7-10). 04/20/24  Yes Pickenpack-Cousar, Athena N, NP  megestrol  (MEGACE ) 20 MG tablet Take 20 mg by mouth 4 (four) times daily. 02/21/24  Yes [provider]  metoprolol  succinate (TOPROL  XL) 25 MG 24 hr tablet Take 0.5 tablets (12.5 mg total) by mouth daily. Patient taking differently: Take 25 mg by mouth daily. 04/26/24  Yes Vicci Rollo SAUNDERS, PA-C  ondansetron  (ZOFRAN ) 4 MG tablet Take 4 mg by mouth daily as needed for nausea or vomiting.   Yes [provider]  ondansetron  (ZOFRAN ) 8 MG tablet Take 0.5 tablets (4 mg total) by mouth every 8 (eight) hours as needed for nausea. Patient taking differently: Take 8 mg by mouth daily as needed for nausea or vomiting. 03/28/24  Yes Pickenpack-Cousar, Fannie SAILOR, NP  OVER THE COUNTER MEDICATION Apply 1 Application topically daily. Topical cream with unknown active ingredient/strength. Sample given by oncologist in office for daily use on radiation burn.   Yes [provider]  oxyCODONE  (OXYCONTIN ) 10 mg 12 hr tablet Take 1 tablet (10 mg total) by mouth every 8 (eight) hours. 04/20/24  Yes Pickenpack-Cousar, Fannie SAILOR, NP  pantoprazole  (PROTONIX ) 40 MG tablet Take 1 tablet (40 mg total) by mouth 2 (two) times daily. Patient taking differently: Take 40 mg by mouth daily. 04/15/24  Yes Will Almarie MATSU, MD  pembrolizumab  (KEYTRUDA ) 100 MG/4ML SOLN Inject 200 mg into the vein every 21 ( twenty-one) days.   Yes [provider]  polyethylene glycol powder (GLYCOLAX /MIRALAX ) 17 GM/SCOOP powder Take 17 g by mouth daily. Patient taking differently: Take 17 g by mouth daily as needed for mild constipation or moderate constipation. 02/15/24  Yes Ghimire, Donalda HERO, MD  senna-docusate (SENOKOT-S)  8.6-50 MG tablet Take 2 tablets by mouth at bedtime. 02/15/24  Yes Ghimire, Donalda HERO, MD  spironolactone  (ALDACTONE ) 25 MG tablet Take 0.5 tablets (12.5 mg total) by mouth daily. 04/26/24 07/25/24 Yes Vicci Rollo SAUNDERS, PA-C     Vital Signs: BP (!) 148/89   Pulse (!) 114   Temp 98.1 F (36.7 C)   Resp (!) 28   Ht 5' 7 (1.702 m)   Wt 121 lb (54.9 kg)   SpO2 (!) 55% Comment: sats not correlating with ABG  BMI 18.95 kg/m   Physical Exam: Patient on vent, remains tachypneic, critically ill; family in room.  Right common femoral vein sheath intact, no discrete hematoma.  Imaging: DG Chest Port 1 View Result Date: 05/11/2024 CLINICAL DATA:  Pleural effusion. EXAM: PORTABLE CHEST 1 VIEW COMPARISON:  05/10/2024 FINDINGS: Endotracheal tube tip is 2.9 cm above the base of the carina. The NG tube passes into the stomach although the distal tip position is not included on the film. The dense consolidative opacity in the right upper lung is similar to prior with probable associated layering right pleural effusion. Left lung remains relatively clear. The cardiopericardial silhouette is within normal limits for size. Telemetry leads overlie the chest. IMPRESSION: No substantial interval  change. Dense consolidative opacity in the right upper lung with probable associated layering right pleural effusion. Electronically Signed   By: Camellia Candle M.D.   On: 05/11/2024 05:08   IR Angiogram Pulmonary Nonselective Catheter Or VenoUS Injection Result Date: 05/10/2024 INDICATION: B/L PEs Symptomatic with high risk PE.  RV/LV ratio 1.6 EXAM: Title; LEFT PULMONARY ARTERIAL LYTIC CATHETER PLACEMENT Listed procedures; 1. ULTRASOUND GUIDANCE FOR VENOUS ACCESS 2. PULMONARY ARTERIOGRAPHY, BILATERAL 3. FLUOROSCOPIC GUIDED PLACEMENT of LEFT PULMONARY ARTERIAL LYTIC INFUSION CATHETER COMPARISON:  CTA CHEST, 05/10/2024 MEDICATIONS: None ANESTHESIA/SEDATION: Local anesthetic and single agent sedation was employed during this  procedure. A total of Versed  0.5 mg was administered intravenously. The patient's level of consciousness and vital signs were monitored continuously by radiology nursing throughout the procedure under my direct supervision. CONTRAST:  35 mL Omnipaque  300 FLUOROSCOPY: Radiation Exposure Index and estimated peak skin dose (PSD); Reference air kerma (RAK), 34 mGy. COMPLICATIONS: None immediate. TECHNIQUE: Informed written consent was obtained from the patient and/or patient's representative after a discussion of the risks, benefits and alternatives to treatment. Questions regarding the procedure were encouraged and answered. A timeout was performed prior to the initiation of the procedure. Ultrasound scanning was performed of the RIGHT groin and demonstrated patency of the common femoral vein. As such, the RIGHT common femoral vein was selected venous access. The RIGHT groin was prepped and draped in the usual sterile fashion, and a sterile drape was applied covering the operative field. Maximum barrier sterile technique with sterile gowns and gloves were used for the procedure. A timeout was performed prior to the initiation of the procedure. Local anesthesia was provided with 1% lidocaine . Under direct ultrasound guidance, the RIGHT common femoral vein was accessed with a micro puncture kit ultimately allowing placement of a 6 Fr 10 cm vascular sheath. Ultrasound images were saved for procedural documentation purposes. With the use of a Bentson wire, an angled pulmonary catheter was advanced into the main pulmonary artery and a limited central pulmonary arteriogram was performed. Pressure measurements were then obtained from the main pulmonary artery. The 5 Fr angled pigtail catheter was advanced into the distal branch of the LEFT lower lobe pulmonary artery. Limited contrast injection confirmed appropriate positioning. Over an exchange length Jesus, the catheter was exchanged for a 90/15 cm multi side-hole infusion  catheter. A postprocedural fluoroscopic image was obtained to document final catheter positioning. The external catheter tubing was secured at the right thigh and the lytic therapy was initiated. The patient tolerated the procedure well without immediate postprocedural complication. FINDINGS: Central pulmonary arteriogram demonstrates patent RIGHT bibasilar pulmonary arteries. Consolidated opacity at the RIGHT upper lobe, consistent with known malignancy and obstruction at the LEFT lower basilar pulmonary arteries. Acquired pressure measurements: Main  pulmonary artery-45/20; mean-27 (normal: < 25/10) Following the procedure, the LEFT infusion catheter tip terminate within the distal aspect of the LEFT lower lobe sub segmental pulmonary arteries. IMPRESSION: 1. Successful pulmonary arteriography and initiation of LEFT catheter-directed pulmonary arterial lysis for symptomatic, high-risk PE. 2. Elevated pressure measurements within the main pulmonary artery compatible with critical pulmonary arterial hypertension. PLAN: Lytic catheter infusion rates as described per order set. Vascular Interventional Radiology will follow the patient with anticipated catheter removal during rounds. Thom Hall, MD [facility] Inter[facility]iology Specialists [facility] Radiology Electronically Signed   By: Thom Hall M.D.   On: 05/10/2024 18:41   IR Angiogram Pulmonary Bilateral Selective Result Date: 05/10/2024 INDICATION: B/L PEs Symptomatic with high risk PE.  RV/LV ratio 1.6 EXAM:  Title; LEFT PULMONARY ARTERIAL LYTIC CATHETER PLACEMENT Listed procedures; 1. ULTRASOUND GUIDANCE FOR VENOUS ACCESS 2. PULMONARY ARTERIOGRAPHY, BILATERAL 3. FLUOROSCOPIC GUIDED PLACEMENT of LEFT PULMONARY ARTERIAL LYTIC INFUSION CATHETER COMPARISON:  CTA CHEST, 05/10/2024 MEDICATIONS: None ANESTHESIA/SEDATION: Local anesthetic and single agent sedation was employed during this procedure. A total of Versed  0.5 mg was administered intravenously.  The patient's level of consciousness and vital signs were monitored continuously by radiology nursing throughout the procedure under my direct supervision. CONTRAST:  35 mL Omnipaque  300 FLUOROSCOPY: Radiation Exposure Index and estimated peak skin dose (PSD); Reference air kerma (RAK), 34 mGy. COMPLICATIONS: None immediate. TECHNIQUE: Informed written consent was obtained from the patient and/or patient's representative after a discussion of the risks, benefits and alternatives to treatment. Questions regarding the procedure were encouraged and answered. A timeout was performed prior to the initiation of the procedure. Ultrasound scanning was performed of the RIGHT groin and demonstrated patency of the common femoral vein. As such, the RIGHT common femoral vein was selected venous access. The RIGHT groin was prepped and draped in the usual sterile fashion, and a sterile drape was applied covering the operative field. Maximum barrier sterile technique with sterile gowns and gloves were used for the procedure. A timeout was performed prior to the initiation of the procedure. Local anesthesia was provided with 1% lidocaine . Under direct ultrasound guidance, the RIGHT common femoral vein was accessed with a micro puncture kit ultimately allowing placement of a 6 Fr 10 cm vascular sheath. Ultrasound images were saved for procedural documentation purposes. With the use of a Bentson wire, an angled pulmonary catheter was advanced into the main pulmonary artery and a limited central pulmonary arteriogram was performed. Pressure measurements were then obtained from the main pulmonary artery. The 5 Fr angled pigtail catheter was advanced into the distal branch of the LEFT lower lobe pulmonary artery. Limited contrast injection confirmed appropriate positioning. Over an exchange length Jesus, the catheter was exchanged for a 90/15 cm multi side-hole infusion catheter. A postprocedural fluoroscopic image was obtained to  document final catheter positioning. The external catheter tubing was secured at the right thigh and the lytic therapy was initiated. The patient tolerated the procedure well without immediate postprocedural complication. FINDINGS: Central pulmonary arteriogram demonstrates patent RIGHT bibasilar pulmonary arteries. Consolidated opacity at the RIGHT upper lobe, consistent with known malignancy and obstruction at the LEFT lower basilar pulmonary arteries. Acquired pressure measurements: Main  pulmonary artery-45/20; mean-27 (normal: < 25/10) Following the procedure, the LEFT infusion catheter tip terminate within the distal aspect of the LEFT lower lobe sub segmental pulmonary arteries. IMPRESSION: 1. Successful pulmonary arteriography and initiation of LEFT catheter-directed pulmonary arterial lysis for symptomatic, high-risk PE. 2. Elevated pressure measurements within the main pulmonary artery compatible with critical pulmonary arterial hypertension. PLAN: Lytic catheter infusion rates as described per order set. Vascular Interventional Radiology will follow the patient with anticipated catheter removal during rounds. Thom Hall, MD [facility] Inter[facility] Radiology Specialists [facility] Radiology Electronically Signed   By: Thom Hall M.D.   On: 05/10/2024 18:41   IR US  Guide Vasc Access Right Result Date: 05/10/2024 INDICATION: B/L PEs Symptomatic with high risk PE.  RV/LV ratio 1.6 EXAM: Title; LEFT PULMONARY ARTERIAL LYTIC CATHETER PLACEMENT Listed procedures; 1. ULTRASOUND GUIDANCE FOR VENOUS ACCESS 2. PULMONARY ARTERIOGRAPHY, BILATERAL 3. FLUOROSCOPIC GUIDED PLACEMENT of LEFT PULMONARY ARTERIAL LYTIC INFUSION CATHETER COMPARISON:  CTA CHEST, 05/10/2024 MEDICATIONS: None ANESTHESIA/SEDATION: Local anesthetic and single agent sedation was employed during this procedure. A total of Versed  0.5 mg  was administered intravenously. The patient's level of consciousness and vital signs were monitored  continuously by radiology nursing throughout the procedure under my direct supervision. CONTRAST:  35 mL Omnipaque  300 FLUOROSCOPY: Radiation Exposure Index and estimated peak skin dose (PSD); Reference air kerma (RAK), 34 mGy. COMPLICATIONS: None immediate. TECHNIQUE: Informed written consent was obtained from the patient and/or patient's representative after a discussion of the risks, benefits and alternatives to treatment. Questions regarding the procedure were encouraged and answered. A timeout was performed prior to the initiation of the procedure. Ultrasound scanning was performed of the RIGHT groin and demonstrated patency of the common femoral vein. As such, the RIGHT common femoral vein was selected venous access. The RIGHT groin was prepped and draped in the usual sterile fashion, and a sterile drape was applied covering the operative field. Maximum barrier sterile technique with sterile gowns and gloves were used for the procedure. A timeout was performed prior to the initiation of the procedure. Local anesthesia was provided with 1% lidocaine . Under direct ultrasound guidance, the RIGHT common femoral vein was accessed with a micro puncture kit ultimately allowing placement of a 6 Fr 10 cm vascular sheath. Ultrasound images were saved for procedural documentation purposes. With the use of a Bentson wire, an angled pulmonary catheter was advanced into the main pulmonary artery and a limited central pulmonary arteriogram was performed. Pressure measurements were then obtained from the main pulmonary artery. The 5 Fr angled pigtail catheter was advanced into the distal branch of the LEFT lower lobe pulmonary artery. Limited contrast injection confirmed appropriate positioning. Over an exchange length Jesus, the catheter was exchanged for a 90/15 cm multi side-hole infusion catheter. A postprocedural fluoroscopic image was obtained to document final catheter positioning. The external catheter tubing was  secured at the right thigh and the lytic therapy was initiated. The patient tolerated the procedure well without immediate postprocedural complication. FINDINGS: Central pulmonary arteriogram demonstrates patent RIGHT bibasilar pulmonary arteries. Consolidated opacity at the RIGHT upper lobe, consistent with known malignancy and obstruction at the LEFT lower basilar pulmonary arteries. Acquired pressure measurements: Main  pulmonary artery-45/20; mean-27 (normal: < 25/10) Following the procedure, the LEFT infusion catheter tip terminate within the distal aspect of the LEFT lower lobe sub segmental pulmonary arteries. IMPRESSION: 1. Successful pulmonary arteriography and initiation of LEFT catheter-directed pulmonary arterial lysis for symptomatic, high-risk PE. 2. Elevated pressure measurements within the main pulmonary artery compatible with critical pulmonary arterial hypertension. PLAN: Lytic catheter infusion rates as described per order set. Vascular Interventional Radiology will follow the patient with anticipated catheter removal during rounds. Thom Hall, MD Vascular and Interventional Radiology Specialists [facility] Radiology Electronically Signed   By: Thom Hall M.D.   On: 05/10/2024 18:41   IR INFUSION THROMBOL ARTERIAL INITIAL (MS) Result Date: 05/10/2024 INDICATION: B/L PEs Symptomatic with high risk PE.  RV/LV ratio 1.6 EXAM: Title; LEFT PULMONARY ARTERIAL LYTIC CATHETER PLACEMENT Listed procedures; 1. ULTRASOUND GUIDANCE FOR VENOUS ACCESS 2. PULMONARY ARTERIOGRAPHY, BILATERAL 3. FLUOROSCOPIC GUIDED PLACEMENT of LEFT PULMONARY ARTERIAL LYTIC INFUSION CATHETER COMPARISON:  CTA CHEST, 05/10/2024 MEDICATIONS: None ANESTHESIA/SEDATION: Local anesthetic and single agent sedation was employed during this procedure. A total of Versed  0.5 mg was administered intravenously. The patient's level of consciousness and vital signs were monitored continuously by radiology nursing throughout the procedure under  my direct supervision. CONTRAST:  35 mL Omnipaque  300 FLUOROSCOPY: Radiation Exposure Index and estimated peak skin dose (PSD); Reference air kerma (RAK), 34 mGy. COMPLICATIONS: None immediate. TECHNIQUE: Informed written consent was  obtained from the patient and/or patient's representative after a discussion of the risks, benefits and alternatives to treatment. Questions regarding the procedure were encouraged and answered. A timeout was performed prior to the initiation of the procedure. Ultrasound scanning was performed of the RIGHT groin and demonstrated patency of the common femoral vein. As such, the RIGHT common femoral vein was selected venous access. The RIGHT groin was prepped and draped in the usual sterile fashion, and a sterile drape was applied covering the operative field. Maximum barrier sterile technique with sterile gowns and gloves were used for the procedure. A timeout was performed prior to the initiation of the procedure. Local anesthesia was provided with 1% lidocaine . Under direct ultrasound guidance, the RIGHT common femoral vein was accessed with a micro puncture kit ultimately allowing placement of a 6 Fr 10 cm vascular sheath. Ultrasound images were saved for procedural documentation purposes. With the use of a Bentson wire, an angled pulmonary catheter was advanced into the main pulmonary artery and a limited central pulmonary arteriogram was performed. Pressure measurements were then obtained from the main pulmonary artery. The 5 Fr angled pigtail catheter was advanced into the distal branch of the LEFT lower lobe pulmonary artery. Limited contrast injection confirmed appropriate positioning. Over an exchange length Jesus, the catheter was exchanged for a 90/15 cm multi side-hole infusion catheter. A postprocedural fluoroscopic image was obtained to document final catheter positioning. The external catheter tubing was secured at the right thigh and the lytic therapy was initiated. The  patient tolerated the procedure well without immediate postprocedural complication. FINDINGS: Central pulmonary arteriogram demonstrates patent RIGHT bibasilar pulmonary arteries. Consolidated opacity at the RIGHT upper lobe, consistent with known malignancy and obstruction at the LEFT lower basilar pulmonary arteries. Acquired pressure measurements: Main  pulmonary artery-45/20; mean-27 (normal: < 25/10) Following the procedure, the LEFT infusion catheter tip terminate within the distal aspect of the LEFT lower lobe sub segmental pulmonary arteries. IMPRESSION: 1. Successful pulmonary arteriography and initiation of LEFT catheter-directed pulmonary arterial lysis for symptomatic, high-risk PE. 2. Elevated pressure measurements within the main pulmonary artery compatible with critical pulmonary arterial hypertension. PLAN: Lytic catheter infusion rates as described per order set. Vascular Interventional Radiology will follow the patient with anticipated catheter removal during rounds. Thom Hall, MD Vascular and Interventional Radiology Specialists [facility] Radiology Electronically Signed   By: Thom Hall M.D.   On: 05/10/2024 18:41   DG Abd 1 View Result Date: 05/10/2024 CLINICAL DATA:  Intubated EXAM: ABDOMEN - 1 VIEW COMPARISON:  05/10/2024 FINDINGS: Frontal view of the lower chest and upper abdomen demonstrates enteric catheter tip and side port projecting over the gastric body. Persistent nephrogram related to recent contrasted procedure and CT. Bowel gas pattern is unremarkable. Venous catheter via a right inferior approach tip overlying the left lower lobe pulmonary vascular distribution. IMPRESSION: 1. Enteric catheter tip projecting over the gastric body. Electronically Signed   By: Ozell Daring M.D.   On: 05/10/2024 18:39   DG CHEST PORT 1 VIEW Result Date: 05/10/2024 CLINICAL DATA:  Intubated EXAM: PORTABLE CHEST 1 VIEW COMPARISON:  05/09/2024 FINDINGS: Single frontal view of the chest  demonstrates endotracheal tube overlying tracheal air column, tip approximately 3 cm above carina. Enteric catheter passes below diaphragm, tip excluded by collimation. Right-sided PICC tip overlying superior vena cava. Pulmonary artery catheter overlying the left lower lobe pulmonary artery distribution. Dense right upper lobe consolidation and right pleural effusion again noted. Bullous changes are seen at the apices. No evidence of pneumothorax.  No acute bony abnormalities. IMPRESSION: 1. Support devices as above. 2. Dense right upper lobe consolidation and right pleural effusion unchanged. Electronically Signed   By: Ozell Daring M.D.   On: 05/10/2024 18:38   ECHOCARDIOGRAM COMPLETE Result Date: 05/10/2024    ECHOCARDIOGRAM REPORT   Patient Name:   Jeff Romero Date of Exam: 05/10/2024 Medical Rec #:  [id number]   Height:       67.0 in Accession #:    [id number]  Weight:       121.0 lb Date of Birth:  09-Apr-1961    BSA:          1.633 m Patient Age:    63 years    BP:           121/84 mmHg Patient Gender: M           HR:           113 bpm. Exam Location:  Inpatient Procedure: 2D Echo, Cardiac Doppler and Color Doppler (Both Spectral and Color            Flow Doppler were utilized during procedure). Indications:     Dyspnea R06.00  History:         Patient has prior history of Echocardiogram examinations, most                  recent 02/13/2024.  Sonographer:     Jayson Gaskins Referring Phys:  [id number] FONDA JAYSON SHARPS Diagnosing Phys: Soyla Merck MD  Sonographer Comments: Technically challenging study due to limited acoustic windows. Image acquisition challenging due to uncooperative patient and Image acquisition challenging due to respiratory motion. IMPRESSIONS  1. Left ventricular ejection fraction, by estimation, is 50 to 55%. The left ventricle has low normal function. Left ventricular endocardial border not optimally defined to evaluate regional wall motion. There is mild left ventricular hypertrophy. Left  ventricular diastolic parameters are indeterminate.  2. Right ventricular systolic function is severely reduced. The right ventricular size is severely enlarged. There is normal pulmonary artery systolic pressure. The estimated right ventricular systolic pressure is 21.0 mmHg. Severe TR with triangular shaped jet suggests rapid equalization of pressures due to annular dilation resulting in poor tricuspid valve coaptation. Chart review indicates known pulmonary embolism.  3. Tricuspid valve regurgitation is severe.  4. Right atrial size was severely dilated.  5. The mitral valve is grossly normal. Trivial mitral valve regurgitation. No evidence of mitral stenosis.  6. The aortic valve is tricuspid. Aortic valve regurgitation is not visualized. No aortic stenosis is present.  7. The inferior vena cava is normal in size with greater than 50% respiratory variability, suggesting right atrial pressure of 3 mmHg. FINDINGS  Left Ventricle: Left ventricular ejection fraction, by estimation, is 50 to 55%. The left ventricle has low normal function. Left ventricular endocardial border not optimally defined to evaluate regional wall motion. The left ventricular internal cavity  size was normal in size. There is mild left ventricular hypertrophy. Left ventricular diastolic parameters are indeterminate. Right Ventricle: The right ventricular size is severely enlarged. No increase in right ventricular wall thickness. Right ventricular systolic function is severely reduced. There is normal pulmonary artery systolic pressure. The tricuspid regurgitant velocity is 2.12 m/s, and with an assumed right atrial pressure of 3 mmHg, the estimated right ventricular systolic pressure is 21.0 mmHg. Left Atrium: Left atrial size was normal in size. Right Atrium: Right atrial size was severely dilated. Pericardium: There is no evidence of pericardial effusion. Mitral Valve: The  mitral valve is grossly normal. Trivial mitral valve regurgitation.  No evidence of mitral valve stenosis. Tricuspid Valve: The tricuspid valve is normal in structure. Tricuspid valve regurgitation is severe. No evidence of tricuspid stenosis. Aortic Valve: The aortic valve is tricuspid. Aortic valve regurgitation is not visualized. No aortic stenosis is present. Pulmonic Valve: The pulmonic valve was not well visualized. Pulmonic valve regurgitation is trivial. No evidence of pulmonic stenosis. Aorta: The aortic root is normal in size and structure. Venous: The inferior vena cava is normal in size with greater than 50% respiratory variability, suggesting right atrial pressure of 3 mmHg. IAS/Shunts: No atrial level shunt detected by color flow Doppler.  LEFT VENTRICLE PLAX 2D LVIDd:         3.30 cm LVIDs:         1.20 cm LV PW:         1.20 cm LV IVS:        1.10 cm LVOT diam:     1.80 cm LVOT Area:     2.54 cm  RIGHT VENTRICLE RV S prime:     8.27 cm/s  AORTA Ao Root diam: 3.20 cm TRICUSPID VALVE TR Peak grad:   18.0 mmHg TR Vmax:        212.00 cm/s  SHUNTS Systemic Diam: 1.80 cm Soyla Merck MD Electronically signed by Soyla Merck MD Signature Date/Time: 05/10/2024/5:17:40 PM    Final (Updated)    US  EKG SITE RITE Result Date: 05/10/2024 If Site Rite image not attached, placement could not be confirmed due to current cardiac rhythm.  DG Abd 1 View Result Date: 05/10/2024 CLINICAL DATA:  Constipation. EXAM: ABDOMEN - 1 VIEW COMPARISON:  CT 05/04/2024 FINDINGS: No bowel dilatation to suggest obstruction. Small volume of formed stool in the colon. No abnormal rectal distention. No visible radiopaque calculi. Known osseous lesions are not well demonstrated by radiograph. IMPRESSION: Small volume of formed stool in the colon. No bowel obstruction. Electronically Signed   By: Andrea Gasman M.D.   On: 05/10/2024 11:39   CT Angio Chest PE W and/or Wo Contrast Result Date: 05/10/2024 CLINICAL DATA:  Worsening dyspnea malignancy EXAM: CT ANGIOGRAPHY CHEST WITH CONTRAST  TECHNIQUE: Multidetector CT imaging of the chest was performed using the standard protocol during bolus administration of intravenous contrast. Multiplanar CT image reconstructions and MIPs were obtained to evaluate the vascular anatomy. RADIATION DOSE REDUCTION: This exam was performed according to the departmental dose-optimization program which includes automated exposure control, adjustment of the mA and/or kV according to patient size and/or use of iterative reconstruction technique. CONTRAST:  75mL OMNIPAQUE  IOHEXOL  350 MG/ML SOLN COMPARISON:  Chest x-ray 05/09/2024, CT 05/04/2024 FINDINGS: Cardiovascular: Satisfactory opacification of the pulmonary arteries to the segmental level. Multiple small acute right lower lobe segmental/subsegmental emboli. Left upper lobe small segmental and subsegmental emboli. Multiple left lower lobe segmental and subsegmental PE. Positive for right heart strain with RV LV ratio of 1.6. The heart appears slightly enlarged. Reflux of contrast into the hepatic veins. No significant pericardial effusion. Mild aortic atherosclerosis. Narrowed appearing upper superior vena cava. Irregular foci of airspace disease in the right superior lower lobe Mediastinum/Nodes: Midline trachea. Supraclavicular adenopathy difficult to see due to streak artifact and paucity of fat. Known mediastinal and right hilar adenopathy is better seen on the 07/31 exam likely due to phase of imaging. Lungs/Pleura: Right hilar mass with narrowing of the bronchi. Masslike consolidation throughout the right upper lobe as seen on recent imaging. Underlying emphysema. Irregular foci of disease  in the right lower lobe without recent significant change. Similar sized moderate right pleural effusion. Suspect areas of necrosis within the right upper lobe consolidation. Upper Abdomen: No acute finding Musculoskeletal: The previously measured suspected right anterior chest wall lesion is also more difficult to see due to  phase of enhancement. Review of the MIP images confirms the above findings. IMPRESSION: 1. Positive for acute bilateral pulmonary emboli with CT evidence for right heart strain (RV/LV Ratio = 1.6 ) consistent with at least submassive (intermediate risk) PE. The presence of right heart strain has been associated with an increased risk of morbidity and mortality. Please refer to the PE Focused order set in EPIC. 2. Redemonstrated large right hilar mass and masslike consolidation in the right upper lobe and infrahilar region, grossly similar as compared with the recent CT. Similar sized moderate right pleural effusion. Overall the findings are more difficult to measure/characterize compared to the CT from 07/31, likely due to phase of enhancement. 3. Known mediastinal, right hilar and supraclavicular adenopathy is better seen on the 07/31 exam likely due to phase of imaging. 4. Aortic atherosclerosis. Critical Value/emergent results were called by telephone at the time of interpretation on 05/10/2024 at 2:16 am to provider Dr. Albertina , who verbally acknowledged these results. Aortic Atherosclerosis (ICD10-I70.0) and Emphysema (ICD10-J43.9). Electronically Signed   By: Luke Bun M.D.   On: 05/10/2024 02:19   DG Chest Portable 1 View Result Date: 05/09/2024 CLINICAL DATA:  shortness of breath.  Lung cancer EXAM: PORTABLE CHEST 1 VIEW COMPARISON:  CT chest abdomen pelvis 05/04/2024 FINDINGS: The heart and mediastinal contours are within normal limits. Right upper lobe complete opacification with apical bullous changes consistent with known underlying malignancy. Left apical bullous changes. No pulmonary edema. Persistent trace to small right pleural effusion. No pneumothorax. No acute osseous abnormality. IMPRESSION: 1. Right upper lobe complete opacification with apical bullous changes consistent with known underlying malignancy. Associated postobstructive atelectasis with possibly superimposed infection. Finding  better evaluated on CT chest 05/04/2024. 2. Persistent trace to small right pleural effusion 3. No acute cardiopulmonary abnormality. Electronically Signed   By: Morgane  Naveau M.D.   On: 05/09/2024 22:10    Labs:  CBC: Recent Labs    05/10/24 1625 05/10/24 1743 05/10/24 1902 05/11/24 0723  WBC 12.3* 14.4* 12.5* 12.2*  HGB 7.2* 5.5* 5.2* 7.6*  HCT 25.8* 20.4* 18.4* 27.8*  PLT 156 145* 124* 24*    COAGS: Recent Labs    03/16/24 0449 03/16/24 1257 03/17/24 0430 03/17/24 1044 05/09/24 2330 05/10/24 0729  INR 1.5*  --   --   --   --  1.8*  APTT 62*   < > 69* 63* 37* 74*   < > = values in this interval not displayed.    BMP: Recent Labs    05/09/24 2145 05/10/24 0340 05/10/24 0842 05/10/24 1247 05/10/24 1743 05/11/24 0723  NA 138 140 136 137 133* 144  K 3.7 4.0 4.5 4.9 5.0 5.1  CL 101 101  --  105 97* 99  CO2 20* 17*  --   --  10* 11*  GLUCOSE 130* 104*  --  124* 548* 238*  BUN 12 15  --  15 22 29*  CALCIUM  8.4* 8.2*  --   --  7.1* 6.7*  CREATININE 0.64 0.81  --  0.80 1.18 2.01*  GFRNONAA >60 >60  --   --  >60 37*    LIVER FUNCTION TESTS: Recent Labs    04/25/24 0755 05/02/24  0957 05/10/24 1743 05/11/24 0723  BILITOT 0.5 0.7 1.0 1.8*  AST 22 16 1,526* >7,000*  ALT 21 16 265* 1,835*  ALKPHOS 206* 186* 152* 292*  PROT 6.2* 6.7 4.7* 4.5*  ALBUMIN  2.3* 2.4* <1.5* <1.5*    Assessment and Plan: Patient with history of stage IV metastatic lung cancer, 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, rt heart strain, respiratory distress; status post initiation of left pulmonary arterial thrombolytic therapy on 8/6 with discontinuation yesterday evening secondary to drop in hemoglobin; now with multisystem organ failure, multi pressor shock (maxed out on pressors), refractory acidosis; pt actively dying; afebrile, creatinine 2, hemoglobin 7.6 ,fib < 60, LA >9; pt not stable enough to bring down for any f/u imaging; no additional IR interventions planned; family considering  DNR; f/u echo pend; mean left PA pressure today 17; rt CFV sheath left in place for needed access per CCM request   Electronically Signed: D. Franky Rakers, PA-C 05/11/2024, 11:22 AM   I spent a total of 20 minutes at the the patient's bedside AND on the patient's hospital floor or unit, greater than 50% of which was counseling/coordinating care for pulmonary arteriogram with initiation of left catheter directed pulmonary arterial lysis of pulmonary emboli    Patient ID: Jeff Romero, male   DOB: 10-25-1960, 62 y.o.   MRN: [id number]

## 2024-06-05 NOTE — Progress Notes (Signed)
   05/11/24 0915  Spiritual Encounters  Type of Visit Follow up  Care provided to: Pt and family  Conversation partners present during encounter Nurse;Physician  Referral source Chaplain team  Reason for visit End-of-life  OnCall Visit No    Chaplain met with wife [name], daughter Elijah and son-in-law. Provided compassionate presence and emotional support. At this time, [name] is of the mind that the medical team should do everything they can for Flora, in alignment with his own wishes. She believes that either way, God's will shall be done. I also invited her to engage in story sharing about Dimetri and their life together, which she was happy to do. Finally, I offered some grief education and reminded her to take care of herself during this time, as she was struggling with certain questions brought on by the anticipatory grief. Chaplain consulted with NP and delivered coffee to [name] (she likes it black), and we remain available whenever needed.

## 2024-06-05 NOTE — Progress Notes (Signed)
 120 ml of Fentanyl  premix NS was wasted in stericyle with Statistician.

## 2024-06-05 NOTE — Plan of Care (Signed)
  Problem: Education: Goal: Ability to describe self-care measures that may prevent or decrease complications (Diabetes Survival Skills Education) will improve Outcome: Progressing   Problem: Coping: Goal: Ability to adjust to condition or change in health will improve Outcome: Progressing   Problem: Health Behavior/Discharge Planning: Goal: Ability to identify and utilize available resources and services will improve Outcome: Progressing Goal: Ability to manage health-related needs will improve Outcome: Progressing   Problem: Clinical Measurements: Goal: Cardiovascular complication will be avoided Outcome: Progressing

## 2024-06-05 NOTE — Progress Notes (Signed)
 PHARMACY - ANTICOAGULATION CONSULT NOTE  Pharmacy Consult for Heparin  Indication: pulmonary embolus  No Known Allergies  Patient Measurements: Height: 5' 7 (170.2 cm) Weight: 54.9 kg (121 lb) IBW/kg (Calculated) : 66.1 HEPARIN  DW (KG): 54.9  Vital Signs: Temp: 97.5 F (36.4 C) (08/07 0800) Temp Source: Bladder (08/07 0800) BP: 148/89 (08/07 0730) Pulse Rate: 30 (08/07 0745)  Labs: Recent Labs    05/09/24 2330 05/10/24 0340 05/10/24 0729 05/10/24 0842 05/10/24 1247 05/10/24 1625 05/10/24 1738 05/10/24 1743 05/10/24 1902 05/11/24 0428  HGB  --  5.2*  --    < > 7.1* 7.2*  --  5.5* 5.2*  --   HCT  --  19.2*  --    < > 21.0* 25.8*  --  20.4* 18.4*  --   PLT  --  252 281  --   --  156  --  145* 124*  --   APTT 37*  --  74*  --   --   --   --   --   --   --   LABPROT  --   --  21.4*  --   --   --   --   --   --   --   INR  --   --  1.8*  --   --   --   --   --   --   --   HEPARINUNFRC <0.10*  --  <0.10*  --   --   --  0.15*  --   --  <0.10*  CREATININE  --  0.81  --   --  0.80  --   --  1.18  --   --   TROPONINIHS 845* 823* 984*  --   --   --   --  1,054*  --   --    < > = values in this interval not displayed.    Estimated Creatinine Clearance: 49.8 mL/min (by C-G formula based on SCr of 1.18 mg/dL).   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   Prostate cancer (HCC)     Medications:  Infusions:   sodium chloride      sodium chloride      sodium chloride      cefTRIAXone  (ROCEPHIN )  IV Stopped (05/10/24 1234)   doxycycline  (VIBRAMYCIN ) IV Stopped (05/10/24 1659)   epinephrine  15 mcg/min (05/11/24 0557)   fentaNYL  infusion INTRAVENOUS 25 mcg/hr (05/11/24 0601)   heparin  Stopped (05/10/24 1905)   norepinephrine  (LEVOPHED ) Adult infusion 30 mcg/min (05/11/24 0557)    sodium bicarbonate  150 mEq in dextrose  5 % 1,150 mL infusion 125 mL/hr at 05/11/24 0557   vasopressin  0.02 Units/min (05/11/24 0557)    Assessment: 63 yoM presented to ED on 8/5 with SOB.  PMH is significant for stage IV lung cancer (currently on immunotherapy), PE on Lovenox  after Eliquis  failure, Rt EJ thrombosis, esophagitis. Pharmacy is consulted to dose heparin  while awaiting CTa to assess for new PE.    Today, 05/09/2024: Heparin  and alteplase  held last night at about 17:30 for suspected bleeding and Hgb in 5s.  Heparin  remains off at this time. Heparin  level < 0.1 CBC:  Hgb up to 7.6 after transfusion, Plt down to 24    Goal of  Therapy:  Heparin  level 0.3-0.7 units/ml Monitor platelets by anticoagulation protocol: Yes   Plan:  Continue to hold heparin  Pharmacy will d/c consult for now, but please re-consult if/when safe to restart. Monitor for s/s bleeding, f/u procedural plans.    Wanda Hasting PharmD, BCPS [facility] main pharmacy [phone] 05/11/2024 8:34 AM

## 2024-06-05 NOTE — Progress Notes (Signed)
 eLink Physician-Brief Progress Note Patient Name: Jeff Romero DOB: 07/11/1961 MRN: [id number]   Date of Service  05/06/2024  HPI/Events of Note  63 year old man with metastatic lung cancer and history of PE presents with respiratory distress and acute on chronic PE with concern for right ventricular strain, submassive PE.   DNR  eICU Interventions  TOD 2058     Intervention Category Minor Interventions: Clinical assessment - ordering diagnostic tests  Niki Cosman 06/02/2024, 9:08 PM

## 2024-06-05 NOTE — Progress Notes (Signed)
*  PRELIMINARY RESULTS* Echocardiogram 2D Echocardiogram has been performed.  Jeff Romero 05/11/2024, 11:16 AM

## 2024-06-05 NOTE — Progress Notes (Signed)
 BLE venous duplex has been completed.  Preliminary findings given to [facility], Charity fundraiser.   Results can be found under chart review under CV PROC. 05/11/2024 4:02 PM Yissel Habermehl RVT, RDMS

## 2024-06-05 NOTE — Plan of Care (Signed)
  Problem: Education: Goal: Ability to describe self-care measures that may prevent or decrease complications (Diabetes Survival Skills Education) will improve Outcome: Not Progressing   Problem: Coping: Goal: Ability to adjust to condition or change in health will improve Outcome: Not Progressing   Problem: Fluid Volume: Goal: Ability to maintain a balanced intake and output will improve Outcome: Not Progressing   Problem: Health Behavior/Discharge Planning: Goal: Ability to identify and utilize available resources and services will improve Outcome: Not Progressing Goal: Ability to manage health-related needs will improve Outcome: Not Progressing   Problem: Metabolic: Goal: Ability to maintain appropriate glucose levels will improve Outcome: Not Progressing   Problem: Nutritional: Goal: Maintenance of adequate nutrition will improve Outcome: Not Progressing

## 2024-06-05 NOTE — Progress Notes (Signed)
 Arterial Catheter Insertion Procedure Note  Esaias Cleavenger  [id number]  1961/07/23  Date:05/11/24  Time:12:36 PM    Provider Performing: Fonda JAYSON Sharps    Procedure: Insertion of Arterial Line (63379) with US  guidance (23062)   Indication(s) Blood pressure monitoring and/or need for frequent ABGs  Consent Risks of the procedure as well as the alternatives and risks of each were explained to the patient and/or caregiver.  Consent for the procedure was obtained and is signed in the bedside chart  Anesthesia None   Time Out Verified patient identification, verified procedure, site/side was marked, verified correct patient position, special equipment/implants available, medications/allergies/relevant history reviewed, required imaging and test results available.   Sterile Technique Maximal sterile technique including full sterile barrier drape, hand hygiene, sterile gown, sterile gloves, mask, hair covering, sterile ultrasound probe cover (if used).   Procedure Description Area of catheter insertion was cleaned with chlorhexidine  and draped in sterile fashion. With real-time ultrasound guidance an arterial catheter was placed into the left femoral artery.  Appropriate arterial tracings confirmed on monitor.     Complications/Tolerance None; patient tolerated the procedure well.   EBL Minimal   Specimen(s) None   Sherlean Sharps AGACNP-BC   [facility] 05/11/2024, 12:37 PM  Please see [email or web address] for pager details.  From 7A-7P if no response, please call [phone]. After hours, please call ELink [phone].

## 2024-06-05 NOTE — Significant Event (Signed)
 Patient was extubated post TOD by RT at 2200. Ventilator put in standby by care team at TOD.

## 2024-06-05 NOTE — Progress Notes (Signed)
 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  Per wife who is giving the history.  The patient was admitted to [facility] in April when they discovered his lung cancer.  He has been losing weight as well with poor appetite, Wife says his appetite has improved last couple days secondary to Marijuana laced butter to help boost his appetite.    Pertinent  Medical History  Esophagitis with ulceration, constipation secondary top narcotic pain meds, Stage IV Lung cancer-Non-Small Cell with mets top bone and liver, PE with failure on Eliquis  and currently on BID Lovenox  injections, s/p bonchoscopic biopsy and s/p RT and Currently on Immunotherapy  Significant Hospital Events: Including procedures, antibiotic start and stop dates in addition to other pertinent events   8/6: Admit to ICU 8/7: Decompensated, yesterday afternoon- worsening progressive acidosis in setting submassive PE vs sepsis/bleed. Concern for right ventricular failure   Interim History / Subjective:  Critically ill  On Vent, and epi/levo/vaso   Objective    Blood pressure (!) 156/104, pulse (!) 119, temperature (!) 97.5 F (36.4 C), temperature source Bladder, resp. rate (!) 35, height 5' 7 (1.702 m), weight 54.9 kg, SpO2 (!) 66%.    Vent Mode: PRVC FiO2 (%):  [30 %-100 %] 100 % Set Rate:  [24 bmp-28 bmp] 28 bmp Vt Set:  [520 mL] 520 mL PEEP:  [5 cmH20] 5 cmH20 Plateau Pressure:  [18 cmH20-21 cmH20] 21 cmH20    Intake/Output Summary (Last 24 hours) at 05/11/2024 0755 Last data filed at 05/11/2024 9394 Gross per 24 hour  Intake 4869.81 ml  Output 0 ml  Net 4869.81 ml   Filed Weights   05/09/24 2119  Weight: 54.9 kg    Examination: General: critically ill on chronic older adult male, lying in icu bed on vent  HEENT: Normocephalic, PERRLA intact- right pupil , left pupil 5- sluggish, ETT, OG  Pink MM CV: s1,s2, RRR- Sinus tachy, no MRG, JVD + pulm: rhonchus, diminished, no distress on vent  Abs: bs not active, taut Extremities: generalized non pitting edema, responds to pain  Skin: no rash  Neuro: Rass -2, responds to painful stimuli, cough gag reflex present  GU: foley intact- no urine output   Resolved problem list   Assessment and Plan  Undifferentiated Shock- suspect obstructive vs +/- septic shock vs hemorrhagic shock  - could also be multifactorial  Lactic Acidosis- lactate > 9 on 8/6 Multisystem Organ Failure  Acute Hypoxic Respiratory Failure secondary to submassive PE - Left Pulmonary arteriography thrombolysis, catheter directed on 8/6   New Pulmonary Emboli Metastatic Lung cancer Stage IV on Immunotherapy Cxr showing right lung atelectasis, last CT chest 6 days ago showing significant cancerous mass with significant atelectasis of right lung Positive for acute bilateral pulmonary emboli with CT evidence for right heart strain (RV/LV Ratio = 1.6 ) Known mediastinal and right hilar adenopathy is better seen on the 07/31 Troponin 253>>845>>823,>> 1054 9BNP 133>> 629 suspect increase to worsening obstructive shock  Heparin  gtt/TPA  stopped yesterday on 8/6 after decompensation event in concerns of bleeding due to hgb decreasing despite blood admin, also concerned for possible transfusion reaction but prelim result negative- do not suspect this was cause of decompensation. Suspect progressive metabolic acidosis secondary to submassive PE despite TPA directed lytics and heparin   gtt but cannot fully rule out septic vs hemorrhagic shock, possibly also multifactorial presentation  P: Difficulty obtaining labs this morning and overnight peripheral stick and unable to obtain via PICC due to pressors running, unable to pause. Okay to keep pressors going but draw labs off of free port on PICC  Discussed with wife GOC, and that patient is unfortunately actively dying, in multisystem organ failure despite full support interventions, recommendation of DNR and transitioning to comfort care have been made.  Waiting on morning labs prior to placing arterial line- CBC, CMET, lactate, Mag, DIC panel Obtain Limited ECHO  Continue to hold lytics and heparin  gtt, IR planning to discontinue sheath later today   Anemia of chronic disease vs Bleed?  H/o esophageal ulcer  Concern for developing DIC in setting of shock-  8/6 PT 21.4, INR 1.8, aptt 74, Fibrinogen  612, D-dimer 18.36, no schistocytes on smear  Hgb 9.9>>7.8>>5.2 Only received 1 unit of PRBCs due to concern of transfusion reaction  No overt bleeding evaluated on exam  Abdomen is taut on exam, but not overly distended, possible bleeding source? Will need CT scan, but has been too unstable- on epi, levo, vaso, bicarb gtt  P:  Repeat STAT CBC, and DIC panel  Heparin  off/along with lytics- dc orders  May need more prbcs/blood products Continue GOC discussion as above   Shock Liver  Elevated LFTS- AST 1526, ALT 265 on 8/6 Suspect patient has also AKI developing/renal failure due to no urine output - Waiting on renal function per STAT labs to result P: Continue to trend LFTs, stat CMET this morning on 8/7 Avoid hepatotoxic agents  Continue supportive care   Constipation  - according to report, patient has not had any BM in the last 10 days  Bowel gas pattern unremarkable on abdominal x-ray- on 8/7  P: Continue NPO status  Continue supportive care  If stabilizes will need CT of abdomen due to absent bowel sounds/tautness    GOC:  Discussed with wife at length in regards to patient's critical care status. Notified wife that patient is in multisystem organ failure despite full support interventions- on 3 pressors: epi, vaso, levophed  as well as a bicarb gtt, and mechanical ventilation. Patient unfortunately is actively dying. Recommend DNR at this time and transition to comfort care sometime today  P:  Currently wife wanting everything done at this time  Continue GOC Palliative care following appreciate assistance  Best Practice (right click and Reselect all SmartList Selections daily)   Diet/type: NPO DVT prophylaxis systemic heparin - off  Pressure ulcer(s): N/A GI prophylaxis: H2B Lines: N/A Foley:  N/A Code Status:  full code  Critical care time: 40 mins    Christian Verneice Caspers AGACNP-BC   [facility] 05/11/2024, 7:55 AM  Please see [email or web address] for pager details.  From 7A-7P if no response, please call [phone]. After hours, please call ELink [phone].

## 2024-06-05 DEATH — deceased

## 2024-06-07 ENCOUNTER — Inpatient Hospital Stay

## 2024-06-07 ENCOUNTER — Inpatient Hospital Stay: Attending: Internal Medicine

## 2024-06-07 ENCOUNTER — Inpatient Hospital Stay: Admitting: Physician Assistant

## 2024-06-22 ENCOUNTER — Other Ambulatory Visit

## 2024-06-27 ENCOUNTER — Ambulatory Visit: Admitting: Physician Assistant

## 2024-06-27 ENCOUNTER — Other Ambulatory Visit

## 2024-06-27 ENCOUNTER — Ambulatory Visit
